# Patient Record
Sex: Female | Born: 1945
Health system: Southern US, Community
[De-identification: ages and names within clinical notes are randomized; demographics above are authoritative.]

## PROBLEM LIST (undated history)

## (undated) DIAGNOSIS — D0359 Melanoma in situ of other part of trunk: Secondary | ICD-10-CM

## (undated) DIAGNOSIS — Z9221 Personal history of antineoplastic chemotherapy: Secondary | ICD-10-CM

## (undated) DIAGNOSIS — Z923 Personal history of irradiation: Secondary | ICD-10-CM

## (undated) DIAGNOSIS — C50919 Malignant neoplasm of unspecified site of unspecified female breast: Secondary | ICD-10-CM

## (undated) DIAGNOSIS — E785 Hyperlipidemia, unspecified: Secondary | ICD-10-CM

## (undated) DIAGNOSIS — C4490 Unspecified malignant neoplasm of skin, unspecified: Secondary | ICD-10-CM

## (undated) DIAGNOSIS — T451X5A Adverse effect of antineoplastic and immunosuppressive drugs, initial encounter: Secondary | ICD-10-CM

## (undated) DIAGNOSIS — I10 Essential (primary) hypertension: Secondary | ICD-10-CM

## (undated) DIAGNOSIS — I427 Cardiomyopathy due to drug and external agent: Secondary | ICD-10-CM

## (undated) HISTORY — DX: Unspecified malignant neoplasm of skin, unspecified: C44.90

## (undated) HISTORY — DX: Essential (primary) hypertension: I10

## (undated) HISTORY — DX: Hyperlipidemia, unspecified: E78.5

## (undated) HISTORY — DX: Adverse effect of antineoplastic and immunosuppressive drugs, initial encounter: T45.1X5A

## (undated) HISTORY — DX: Malignant neoplasm of unspecified site of unspecified female breast: C50.919

## (undated) HISTORY — DX: Adverse effect of antineoplastic and immunosuppressive drugs, initial encounter: I42.7

## (undated) MED FILL — Sodium Chloride IV Soln 0.9%: INTRAVENOUS | Qty: 250 | Status: AC

---

## 1993-02-13 HISTORY — PX: MASTECTOMY: SHX3

## 1999-09-01 ENCOUNTER — Encounter (HOSPITAL_COMMUNITY): Payer: Self-pay | Admitting: Oncology

## 1999-09-01 ENCOUNTER — Encounter: Admission: RE | Admit: 1999-09-01 | Discharge: 1999-09-01 | Payer: Self-pay | Admitting: Oncology

## 1999-09-02 ENCOUNTER — Other Ambulatory Visit: Admission: RE | Admit: 1999-09-02 | Discharge: 1999-09-02 | Payer: Self-pay | Admitting: Obstetrics and Gynecology

## 2000-09-13 ENCOUNTER — Encounter (HOSPITAL_COMMUNITY): Payer: Self-pay | Admitting: Oncology

## 2000-09-13 ENCOUNTER — Encounter: Admission: RE | Admit: 2000-09-13 | Discharge: 2000-09-13 | Payer: Self-pay | Admitting: Oncology

## 2001-03-04 ENCOUNTER — Encounter: Admission: RE | Admit: 2001-03-04 | Discharge: 2001-03-04 | Payer: Self-pay | Admitting: Oncology

## 2001-03-04 ENCOUNTER — Encounter (HOSPITAL_COMMUNITY): Admission: RE | Admit: 2001-03-04 | Discharge: 2001-04-03 | Payer: Self-pay | Admitting: Oncology

## 2001-09-16 ENCOUNTER — Encounter: Admission: RE | Admit: 2001-09-16 | Discharge: 2001-09-16 | Payer: Self-pay | Admitting: *Deleted

## 2001-09-16 ENCOUNTER — Encounter: Payer: Self-pay | Admitting: *Deleted

## 2002-03-04 ENCOUNTER — Encounter: Admission: RE | Admit: 2002-03-04 | Discharge: 2002-03-04 | Payer: Self-pay | Admitting: Oncology

## 2002-03-04 ENCOUNTER — Encounter (HOSPITAL_COMMUNITY): Admission: RE | Admit: 2002-03-04 | Discharge: 2002-04-03 | Payer: Self-pay | Admitting: Oncology

## 2002-09-16 ENCOUNTER — Encounter: Admission: RE | Admit: 2002-09-16 | Discharge: 2002-09-16 | Payer: Self-pay | Admitting: Oncology

## 2002-09-16 ENCOUNTER — Encounter (HOSPITAL_COMMUNITY): Admission: RE | Admit: 2002-09-16 | Discharge: 2002-10-16 | Payer: Self-pay | Admitting: Oncology

## 2002-09-25 ENCOUNTER — Encounter: Admission: RE | Admit: 2002-09-25 | Discharge: 2002-09-25 | Payer: Self-pay | Admitting: Oncology

## 2002-09-25 ENCOUNTER — Encounter (HOSPITAL_COMMUNITY): Payer: Self-pay | Admitting: Oncology

## 2003-03-06 ENCOUNTER — Encounter (HOSPITAL_COMMUNITY): Admission: RE | Admit: 2003-03-06 | Discharge: 2003-04-05 | Payer: Self-pay | Admitting: Oncology

## 2003-03-06 ENCOUNTER — Encounter: Admission: RE | Admit: 2003-03-06 | Discharge: 2003-03-06 | Payer: Self-pay | Admitting: Oncology

## 2003-10-27 ENCOUNTER — Encounter: Admission: RE | Admit: 2003-10-27 | Discharge: 2003-10-27 | Payer: Self-pay | Admitting: *Deleted

## 2004-03-04 ENCOUNTER — Ambulatory Visit (HOSPITAL_COMMUNITY): Payer: Self-pay | Admitting: Oncology

## 2004-03-04 ENCOUNTER — Encounter: Admission: RE | Admit: 2004-03-04 | Discharge: 2004-03-04 | Payer: Self-pay | Admitting: Oncology

## 2004-03-11 ENCOUNTER — Ambulatory Visit (HOSPITAL_COMMUNITY): Admission: RE | Admit: 2004-03-11 | Discharge: 2004-03-11 | Payer: Self-pay | Admitting: Gastroenterology

## 2004-11-18 ENCOUNTER — Ambulatory Visit (HOSPITAL_COMMUNITY): Admission: RE | Admit: 2004-11-18 | Discharge: 2004-11-18 | Payer: Self-pay | Admitting: Oncology

## 2005-03-06 ENCOUNTER — Encounter (HOSPITAL_COMMUNITY): Admission: RE | Admit: 2005-03-06 | Discharge: 2005-04-05 | Payer: Self-pay | Admitting: Oncology

## 2005-03-06 ENCOUNTER — Ambulatory Visit (HOSPITAL_COMMUNITY): Payer: Self-pay | Admitting: Oncology

## 2005-03-06 ENCOUNTER — Encounter: Admission: RE | Admit: 2005-03-06 | Discharge: 2005-03-06 | Payer: Self-pay | Admitting: Oncology

## 2005-04-10 ENCOUNTER — Encounter: Admission: RE | Admit: 2005-04-10 | Discharge: 2005-04-10 | Payer: Self-pay | Admitting: Oncology

## 2005-11-20 ENCOUNTER — Encounter: Admission: RE | Admit: 2005-11-20 | Discharge: 2005-11-20 | Payer: Self-pay | Admitting: Oncology

## 2005-11-28 ENCOUNTER — Encounter (INDEPENDENT_AMBULATORY_CARE_PROVIDER_SITE_OTHER): Payer: Self-pay | Admitting: Specialist

## 2005-11-28 ENCOUNTER — Encounter (INDEPENDENT_AMBULATORY_CARE_PROVIDER_SITE_OTHER): Payer: Self-pay | Admitting: Diagnostic Radiology

## 2005-11-28 ENCOUNTER — Encounter: Admission: RE | Admit: 2005-11-28 | Discharge: 2005-11-28 | Payer: Self-pay | Admitting: Oncology

## 2005-12-05 ENCOUNTER — Ambulatory Visit (HOSPITAL_COMMUNITY): Admission: RE | Admit: 2005-12-05 | Discharge: 2005-12-05 | Payer: Self-pay | Admitting: Oncology

## 2005-12-21 ENCOUNTER — Encounter (INDEPENDENT_AMBULATORY_CARE_PROVIDER_SITE_OTHER): Payer: Self-pay | Admitting: Specialist

## 2005-12-21 ENCOUNTER — Encounter: Admission: RE | Admit: 2005-12-21 | Discharge: 2005-12-21 | Payer: Self-pay | Admitting: General Surgery

## 2005-12-21 ENCOUNTER — Ambulatory Visit (HOSPITAL_BASED_OUTPATIENT_CLINIC_OR_DEPARTMENT_OTHER): Admission: RE | Admit: 2005-12-21 | Discharge: 2005-12-21 | Payer: Self-pay | Admitting: General Surgery

## 2005-12-21 HISTORY — PX: BREAST LUMPECTOMY: SHX2

## 2006-01-12 ENCOUNTER — Ambulatory Visit (HOSPITAL_COMMUNITY): Payer: Self-pay | Admitting: Oncology

## 2006-01-16 ENCOUNTER — Ambulatory Visit: Admission: RE | Admit: 2006-01-16 | Discharge: 2006-04-13 | Payer: Self-pay | Admitting: *Deleted

## 2006-04-13 ENCOUNTER — Ambulatory Visit: Admission: RE | Admit: 2006-04-13 | Discharge: 2006-05-09 | Payer: Self-pay | Admitting: *Deleted

## 2006-05-21 ENCOUNTER — Ambulatory Visit (HOSPITAL_COMMUNITY): Payer: Self-pay | Admitting: Oncology

## 2006-05-23 ENCOUNTER — Ambulatory Visit: Payer: Self-pay | Admitting: Oncology

## 2006-07-25 ENCOUNTER — Ambulatory Visit: Payer: Self-pay | Admitting: Oncology

## 2006-12-05 ENCOUNTER — Encounter (HOSPITAL_COMMUNITY): Admission: RE | Admit: 2006-12-05 | Discharge: 2007-01-04 | Payer: Self-pay | Admitting: Oncology

## 2006-12-05 ENCOUNTER — Encounter (HOSPITAL_COMMUNITY): Payer: Self-pay | Admitting: Oncology

## 2006-12-05 ENCOUNTER — Ambulatory Visit (HOSPITAL_COMMUNITY): Payer: Self-pay | Admitting: Oncology

## 2007-07-15 ENCOUNTER — Encounter: Admission: RE | Admit: 2007-07-15 | Discharge: 2007-07-15 | Payer: Self-pay | Admitting: Oncology

## 2007-12-04 ENCOUNTER — Ambulatory Visit (HOSPITAL_COMMUNITY): Payer: Self-pay | Admitting: Oncology

## 2008-07-20 ENCOUNTER — Encounter: Admission: RE | Admit: 2008-07-20 | Discharge: 2008-07-20 | Payer: Self-pay | Admitting: Oncology

## 2008-12-22 ENCOUNTER — Ambulatory Visit (HOSPITAL_COMMUNITY): Payer: Self-pay | Admitting: Oncology

## 2009-08-10 ENCOUNTER — Encounter: Admission: RE | Admit: 2009-08-10 | Discharge: 2009-08-10 | Payer: Self-pay | Admitting: Oncology

## 2010-01-28 ENCOUNTER — Ambulatory Visit (HOSPITAL_COMMUNITY): Payer: Self-pay | Admitting: Oncology

## 2010-01-28 ENCOUNTER — Encounter (HOSPITAL_COMMUNITY)
Admission: RE | Admit: 2010-01-28 | Discharge: 2010-02-27 | Payer: Self-pay | Source: Home / Self Care | Attending: Oncology | Admitting: Oncology

## 2010-03-05 ENCOUNTER — Encounter (HOSPITAL_COMMUNITY): Payer: Self-pay | Admitting: Oncology

## 2010-07-01 NOTE — Op Note (Signed)
NAMETEJA, Tanya Mitchell NO.:  [id number]   MEDICAL RECORD NO.:  [id number]          PATIENT TYPE:  AMB   LOCATION:  DSC                          FACILITY:  [facility]   PHYSICIAN:  Gita Kudo, M.D. DATE OF BIRTH:  November 08, 1945   DATE OF PROCEDURE:  12/21/2005  DATE OF DISCHARGE:                                 OPERATIVE REPORT   OPERATIVE PROCEDURE:  Right partial mastectomy with needle localization and  specimen mammography.   SURGEON:  Gita Kudo, M.D.   ANESTHESIA:  General.   PREOPERATIVE DIAGNOSIS:  Ductal carcinoma in situ, right breast.   CLINICAL SUMMARY:  [name] is now 65 years old.  Approximately 12 years  ago I did a left mastectomy for a 4.5 cm cancer with one positive node.  She  had a chemotherapy protocol by Dr. Mariel Sleet and then reconstruction by  TRAM.  Now she came in with an abnormality in her right breast,  calcifications on mammogram.  Core biopsy showed DCIS.  Presented at Tumor  Conference, had MRI showing no other pathology, and comes in for a wide  excision.   OPERATIVE FINDINGS:  The breast tissue felt and looked normal.  I went  widely around the wire and did not encounter it.  Mammogram of the specimen  showed good margins.  Pathology is pending.   OPERATIVE PROCEDURE:  Under satisfactory general anesthesia, the patient was  positioned, prepped and draped in a standard fashion.  During the procedure  a total of 30 mL of 0.5% Marcaine was infiltrated for postop analgesia.  A  curved incision was made over the abnormality as calculated from mammogram.  Then this was carried down with flaps and the wire, which was placed  laterally, was brought into the wound through the skin and subcu.  Using the  wire as a guide I went widely around it down to the chest wall and, using  cautery for hemostasis and dissection, removed that entire area.  I did not  encounter the wire again after first finding it and therefore felt I was  widely around the lesion.  This was then marked with suture and sent for  mammography.  Meanwhile, the wound was made hemostatic by cautery and closed  with deep 3-0 Vicryl and interrupted and running 4-0 nylon.  A sterile  pressure  dressing was applied when the report came back from radiology that the wire  clip marking the biopsy site was well in the specimen with good margins  around.  The the patient will be followed as an outpatient.  There were no  complications, and the sponge and needle counts were correct.           ______________________________  Gita Kudo, M.D.     MRL/MEDQ  D:  12/21/2005  T:  12/22/2005  Job:  [id number]   cc:   Ladona Horns. Mariel Sleet, MD  Colleen Can. Deborah Chalk, M.D.  Al Decant. Janey Greaser, MD  [facility] New Patient Coordinator

## 2010-07-12 ENCOUNTER — Other Ambulatory Visit (HOSPITAL_COMMUNITY): Payer: Self-pay | Admitting: Oncology

## 2010-07-12 DIAGNOSIS — Z9012 Acquired absence of left breast and nipple: Secondary | ICD-10-CM

## 2010-07-12 DIAGNOSIS — Z9889 Other specified postprocedural states: Secondary | ICD-10-CM

## 2010-08-12 ENCOUNTER — Ambulatory Visit
Admission: RE | Admit: 2010-08-12 | Discharge: 2010-08-12 | Disposition: A | Discharge status: Home / Self Care | Payer: BC Managed Care – PPO | Source: Ambulatory Visit | Attending: Oncology | Admitting: Oncology

## 2010-08-12 DIAGNOSIS — Z9889 Other specified postprocedural states: Secondary | ICD-10-CM

## 2010-08-12 DIAGNOSIS — Z9012 Acquired absence of left breast and nipple: Secondary | ICD-10-CM

## 2010-08-15 ENCOUNTER — Encounter: Payer: Self-pay | Admitting: Nurse Practitioner

## 2010-08-24 ENCOUNTER — Encounter: Payer: Self-pay | Admitting: Nurse Practitioner

## 2010-08-26 ENCOUNTER — Encounter: Payer: Self-pay | Admitting: Nurse Practitioner

## 2010-08-26 ENCOUNTER — Ambulatory Visit (INDEPENDENT_AMBULATORY_CARE_PROVIDER_SITE_OTHER): Payer: Medicare Other | Admitting: Nurse Practitioner

## 2010-08-26 ENCOUNTER — Other Ambulatory Visit (HOSPITAL_COMMUNITY): Payer: Self-pay | Admitting: Oncology

## 2010-08-26 ENCOUNTER — Telehealth (HOSPITAL_COMMUNITY): Payer: Self-pay | Admitting: *Deleted

## 2010-08-26 VITALS — BP 118/72 | HR 60 | Ht 62.0 in | Wt 137.0 lb

## 2010-08-26 DIAGNOSIS — E785 Hyperlipidemia, unspecified: Secondary | ICD-10-CM

## 2010-08-26 DIAGNOSIS — I428 Other cardiomyopathies: Secondary | ICD-10-CM | POA: Insufficient documentation

## 2010-08-26 MED ORDER — CARVEDILOL PHOSPHATE ER 40 MG PO CP24: 40.0000 mg | ORAL_CAPSULE | Freq: Every day | ORAL | Status: DC

## 2010-08-26 MED ORDER — ROSUVASTATIN CALCIUM 20 MG PO TABS: 20.0000 mg | ORAL_TABLET | Freq: Every day | ORAL | Status: AC

## 2010-08-26 MED ORDER — HYDROCHLOROTHIAZIDE 25 MG PO TABS: 25.0000 mg | ORAL_TABLET | Freq: Every day | ORAL | Status: DC

## 2010-08-26 MED ORDER — RAMIPRIL 10 MG PO TABS: 10.0000 mg | ORAL_TABLET | Freq: Every day | ORAL | Status: DC

## 2010-08-26 NOTE — Patient Instructions (Signed)
Stay on your current medicines We will see you back in one year.  I will have you see Dr. Marca Ancona at that time.

## 2010-08-26 NOTE — Progress Notes (Signed)
    Tanya Mitchell Runner Date of Birth: [birth year]   History of Present Illness: Tanya Mitchell is seen back today for her one year check. She is seen for Dr. Shirlee Latch. She is a former patient of Dr. Ronnald Nian. She is doing great. She retired from [employer] two weeks ago. She is excited. No cardiac complaints. She is tolerating her medicines. Last echo was in July of 2011 and showed her EF to be normal at 55 to 60%. She is not having chest pain or shortness of breath.   Current Outpatient Prescriptions on File Prior to Visit  Medication Sig Dispense Refill  . aspirin 325 MG tablet Take 325 mg by mouth daily.        . carvedilol (COREG CR) 40 MG 24 hr capsule Take 40 mg by mouth daily.        . hydrochlorothiazide 25 MG tablet Take 25 mg by mouth daily.        . ramipril (ALTACE) 10 MG tablet Take 10 mg by mouth daily.        . rosuvastatin (CRESTOR) 20 MG tablet Take 20 mg by mouth daily.        . Multiple Vitamin (MULTIVITAMIN) tablet Take 1 tablet by mouth daily.          No Known Allergies  Past Medical History  Diagnosis Date  . Cardiomyopathy secondary to chemotherapy     FOR BREAST CANCER  . Hypertension   . Hyperlipidemia   . Breast cancer     Past Surgical History  Procedure Date  . Mastectomy     RECONSTRUCTION SURGERY  . Breast lumpectomy   . US echocardiography 09/10/2009    EF 55-60%    History  Smoking status  . Former Smoker  . Quit date: 02/14/1964  Smokeless tobacco  . Not on file    History  Alcohol Use No    Family History  Problem Relation Age of Onset  . Lung cancer Father   . Autism Son     Review of Systems: The review of systems is as above.  All other systems were reviewed and are negative.  Physical Exam: BP 118/72  Pulse 60  Ht 5\' 2"  (1.575 m)  Wt 137 lb (62.143 kg)  BMI 25.06 kg/m2 Patient is very pleasant and in no acute distress. Skin is warm and dry. Color is normal.  HEENT is unremarkable. Normocephalic/atraumatic. PERRL. Sclera are  nonicteric. Neck is supple. No masses. No JVD. Lungs are clear. Cardiac exam shows a regular rate and rhythm. No S3. Abdomen is soft. Extremities are without edema. Gait and ROM are intact. No gross neurologic deficits noted.  LABORATORY DATA:   Assessment / Plan:

## 2010-08-26 NOTE — Assessment & Plan Note (Addendum)
Her cardiomyopathy was secondary to chemotherapy in the remote past. She is on a good CHF regimen. EF is now normal. Last echo was last year. I will have her see Dr. Shirlee Latch for follow up in one year. Patient is agreeable to this plan and will call if any problems develop in the interim.   Dalton [employer]

## 2010-08-26 NOTE — Telephone Encounter (Signed)
Did we originally prescribe this medication?

## 2010-09-05 ENCOUNTER — Telehealth: Payer: Self-pay | Admitting: Nurse Practitioner

## 2010-09-05 NOTE — Telephone Encounter (Signed)
Called stating her insurance says Coreg CR is not on her formulary. Per Lawson Fiscal can give her Carvedilol 12.5 mg BID. States she has 3 months supply now of Coreg. Advised her to call us back in November and we can reorder supply until she sees Dr. Shirlee Latch 7/13. Advised her [facility] would not accept now if she just received her supply. She understands and will call back in November.,

## 2010-09-05 NOTE — Telephone Encounter (Signed)
Patient received letter from insurance [facility] HMO stating that COREG is not in the formulary.  Will need physician approval for the medication or will need to change to a different medication.  Please call patient to discuss.

## 2010-09-09 ENCOUNTER — Encounter: Payer: Self-pay | Admitting: Cardiology

## 2010-11-07 ENCOUNTER — Telehealth: Payer: Self-pay | Admitting: Nurse Practitioner

## 2010-11-07 MED ORDER — CARVEDILOL 12.5 MG PO TABS: 12.5000 mg | ORAL_TABLET | Freq: Two times a day (BID) | ORAL | Status: DC

## 2010-11-07 NOTE — Telephone Encounter (Signed)
Called stating her insurance has changed and they won't approve Coreg CR; per Lawson Fiscal will change her to Carvedilol 12.5 mg BID. Will send to [facility]

## 2010-11-07 NOTE — Telephone Encounter (Signed)
Returning your call. °

## 2010-11-07 NOTE — Telephone Encounter (Signed)
Pt just changed insurance and they are questioning the fact she is on coreg and she needs to talk to someone about this

## 2011-01-27 ENCOUNTER — Encounter (HOSPITAL_COMMUNITY): Payer: Medicare Other | Attending: Oncology | Admitting: Oncology

## 2011-01-27 ENCOUNTER — Encounter (HOSPITAL_COMMUNITY): Payer: Self-pay | Admitting: Oncology

## 2011-01-27 VITALS — BP 131/79 | HR 72 | Temp 98.3°F | Wt 137.4 lb

## 2011-01-27 DIAGNOSIS — Z7982 Long term (current) use of aspirin: Secondary | ICD-10-CM

## 2011-01-27 DIAGNOSIS — C50919 Malignant neoplasm of unspecified site of unspecified female breast: Secondary | ICD-10-CM

## 2011-01-27 DIAGNOSIS — M8580 Other specified disorders of bone density and structure, unspecified site: Secondary | ICD-10-CM

## 2011-01-27 DIAGNOSIS — Z7901 Long term (current) use of anticoagulants: Secondary | ICD-10-CM

## 2011-01-27 DIAGNOSIS — Z853 Personal history of malignant neoplasm of breast: Secondary | ICD-10-CM

## 2011-01-27 NOTE — Progress Notes (Signed)
CC:   Tanya Mantle, RN Tanya Mitchell, M.D. Tanya Mitchell. Tanya Mitchell, M.D. Tanya Mitchell, M.D.  DIAGNOSES: 1. Ductal carcinoma in situ of the right breast, status post biopsy     followed by wide local excision with the first biopsy on 11/28/2005     for a high-grade lesion, estrogen receptor/progesterone receptor-     negative, with several areas within the biopsy that were non-     contiguous.  Dr. Dorna Bloom treated her with radiation therapy     postoperatively.  She has had no evidence of recurrent disease. 2. Left-sided invasive ductal carcinoma of the breast, status post     mastectomy with reconstruction in June 1995, for an estrogen     receptor-positive cancer at 97%, progesterone receptor-positive at     45%, intermediate grade, 1.5 cm in size (T2 N1) with 1 of 10     positive nodes.  She participated in the CALGB protocol 9394 with a     Adriamycin x3 cycles followed by sequential Cytoxan for 3 cycles     and then tamoxifen for 5 years, finishing as of November 2000, once     again without recurrent disease next . 3. BRCA1 and BRCA2 negativity. 4. Hypercholesterolemia, on Crestor in the past, presently not on that     drug. 5. History of hypertension on 10 mg of Altace a day,     hydrochlorothiazide 25 mg a day and Coreg 12.5 mg a day.  She has     seen Dr. Delfin Edis She also takes an aspirin a day,  I should     add. Tanya Mitchell is doing great, asymptomatic.  Just had a grandbaby born to her daughter, who is 35, who moved from [place] to the [place].  She is very happy.  She is getting to see the grandbaby every day basically. Tanya Mitchell is back at work temporarily during the busy season at [employer].  May be able to get blood work there, so we will do that there instead of here today.  Dr. Deborah Mitchell she is aware of has probably retired, so I do not think she has seen anybody else yet at that office, but we will send that office a note.  She is doing well.  She is  up-to-date on colonoscopies, etc.  She is up- to-date on mammography and she has not had a bone density since 2007 so we need to do that.  PHYSICAL EXAMINATION:  Today actually looks great.  She is asymptomatic, not in any pain.  Her weight is 137 pounds.  That is stable compared to last year.  Blood pressure 131/79 in the right arm sitting position, pulse 72 and regular, respirations 16 and unlabored.  She is afebrile. Lymph nodes:  Negative throughout.  The right breast is negative for any masses, just some distortion from the surgery and radiation of the nipple area.  The left chest wall has been reconstructed but is negative as well.  Lungs:  Clear.  She has a skin lesion, left upper back, but she saw the dermatologist, who looked at this just within the last 2 weeks and told her again it is benign, not to worry about, but it is a slightly pigmented lesion that looks slightly irregular to me, so I am glad that the dermatologist saw her.  She has a blue nevus in the right upper arm and a benign skin lesion in the left mid abdomen.  She has no hepatosplenomegaly.  Bowel sounds  are normal.  No distention of her abdomen.  Heart:  Shows a regular rhythm and rate today without murmur, rub or gallop.  She has no peripheral edema and, again, her lungs are clear to auscultation and percussion.  So we will see her back in a year.  We will get her lab work at her place of employment and then get the bone density.  I have instructed her for right now to take 1000 to 2000 units of vitamin D a day and 600 mg of calcium a day, and we will see what the bone density shows before just jumping on the bandwagon of bisphosphonates.    ______________________________ Ladona Horns. Mariel Sleet, MD ESN/MEDQ  D:  01/27/2011  T:  01/27/2011  Job:  [id number]

## 2011-01-27 NOTE — Progress Notes (Signed)
This office note has been dictated.

## 2011-01-27 NOTE — Patient Instructions (Signed)
[facility]  Discharge Instructions STARLINA LAPRE  [id number] 1945/10/21   RECOMMENDATIONS MADE BY THE CONSULTANT AND ANY TEST RESULTS WILL BE SENT TO YOUR REFERRING DOCTOR.   EXAM FINDINGS BY MD TODAY AND SIGNS AND SYMPTOMS TO REPORT TO CLINIC OR PRIMARY MD: Exam findings are good today.  Follow-up with Dr. Mariel Sleet in 1 year.  Dr. Mariel Sleet has written for you to have your employer draw:  CBC, CMET, fasting lipid panel, Vit D level - please be sure that you are fasting prior to having these labs drawn.    MEDICATIONS PRESCRIBED: no prescriptions.   Start taking: *Calcium 600mg  twice daily *Vitamin D 1000-2000mg  daily  I acknowledge that I have been informed and understand all the instructions given to me and received a copy. I do not have any more questions at this time, but understand that I may call the Specialty Clinic at [facility] at [phone] during business hours should I have any further questions or need assistance in obtaining follow-up care.    __________________________________________  _____________  __________ Signature of Patient or Authorized Representative            Date                   Time    __________________________________________ Nurse's Signature

## 2011-02-01 ENCOUNTER — Other Ambulatory Visit: Payer: Medicare Other

## 2011-02-14 DIAGNOSIS — C4490 Unspecified malignant neoplasm of skin, unspecified: Secondary | ICD-10-CM

## 2011-02-14 HISTORY — DX: Unspecified malignant neoplasm of skin, unspecified: C44.90

## 2011-02-16 ENCOUNTER — Ambulatory Visit
Admission: RE | Admit: 2011-02-16 | Discharge: 2011-02-16 | Disposition: A | Discharge status: Home / Self Care | Payer: Medicare Other | Source: Ambulatory Visit | Attending: Oncology | Admitting: Oncology

## 2011-02-16 DIAGNOSIS — M8580 Other specified disorders of bone density and structure, unspecified site: Secondary | ICD-10-CM

## 2011-03-14 ENCOUNTER — Encounter: Payer: Self-pay | Admitting: Oncology

## 2011-03-23 ENCOUNTER — Telehealth (HOSPITAL_COMMUNITY): Payer: Self-pay | Admitting: *Deleted

## 2011-03-23 ENCOUNTER — Other Ambulatory Visit (HOSPITAL_COMMUNITY): Payer: Self-pay | Admitting: Oncology

## 2011-03-23 NOTE — Telephone Encounter (Signed)
What do you think about Lipitor?  The conversion from Crestor 20 mg to Lipitor is 40 mg of Lipitor.

## 2011-03-27 ENCOUNTER — Other Ambulatory Visit (HOSPITAL_COMMUNITY): Payer: Self-pay | Admitting: Oncology

## 2011-03-27 DIAGNOSIS — E7800 Pure hypercholesterolemia, unspecified: Secondary | ICD-10-CM

## 2011-03-27 MED ORDER — ATORVASTATIN CALCIUM 40 MG PO TABS: 40.0000 mg | ORAL_TABLET | Freq: Every day | ORAL | Status: DC

## 2011-03-28 ENCOUNTER — Telehealth (HOSPITAL_COMMUNITY): Payer: Self-pay | Admitting: *Deleted

## 2011-03-28 NOTE — Telephone Encounter (Signed)
Spoke with pt. Took several attempt's for her to understand that the Lipitor was being prescribed for her instead of the Crestor at her request because of the cost of the Crestor. Pt verbalized understanding.

## 2011-03-28 NOTE — Telephone Encounter (Signed)
Message copied by Dennie Maizes on Tue Mar 28, 2011  8:28 AM ------      Message from: Ellouise Newer III      Created: Mon Mar 27, 2011  7:22 PM       Let her know that I e-scribe Lipitor to her [facility]

## 2011-03-30 ENCOUNTER — Other Ambulatory Visit (HOSPITAL_COMMUNITY): Payer: Self-pay | Admitting: Oncology

## 2011-03-30 DIAGNOSIS — E7800 Pure hypercholesterolemia, unspecified: Secondary | ICD-10-CM

## 2011-03-30 MED ORDER — ATORVASTATIN CALCIUM 40 MG PO TABS: 40.0000 mg | ORAL_TABLET | Freq: Every day | ORAL | Status: DC

## 2011-03-31 ENCOUNTER — Other Ambulatory Visit (HOSPITAL_COMMUNITY): Payer: Self-pay | Admitting: Oncology

## 2011-04-18 ENCOUNTER — Telehealth (HOSPITAL_COMMUNITY): Payer: Self-pay | Admitting: *Deleted

## 2011-04-18 ENCOUNTER — Other Ambulatory Visit: Payer: Self-pay

## 2011-04-18 ENCOUNTER — Other Ambulatory Visit: Payer: Self-pay | Admitting: Cardiology

## 2011-04-18 MED ORDER — CARVEDILOL 12.5 MG PO TABS: 12.5000 mg | ORAL_TABLET | Freq: Two times a day (BID) | ORAL | Status: DC

## 2011-04-18 MED ORDER — HYDROCHLOROTHIAZIDE 25 MG PO TABS: 25.0000 mg | ORAL_TABLET | Freq: Every day | ORAL | Status: DC

## 2011-04-18 MED ORDER — RAMIPRIL 10 MG PO TABS: 10.0000 mg | ORAL_TABLET | Freq: Every day | ORAL | Status: DC

## 2011-04-18 NOTE — Telephone Encounter (Signed)
Addended by: Erin Hearing on: 04/18/2011 04:35 PM   Modules accepted: Orders

## 2011-07-12 ENCOUNTER — Other Ambulatory Visit (HOSPITAL_COMMUNITY): Payer: Self-pay | Admitting: Oncology

## 2011-07-12 DIAGNOSIS — Z853 Personal history of malignant neoplasm of breast: Secondary | ICD-10-CM

## 2011-08-15 ENCOUNTER — Ambulatory Visit
Admission: RE | Admit: 2011-08-15 | Discharge: 2011-08-15 | Disposition: A | Discharge status: Home / Self Care | Payer: Medicare Other | Source: Ambulatory Visit | Attending: Oncology | Admitting: Oncology

## 2011-08-15 DIAGNOSIS — Z853 Personal history of malignant neoplasm of breast: Secondary | ICD-10-CM

## 2011-09-14 ENCOUNTER — Telehealth (HOSPITAL_COMMUNITY): Payer: Self-pay | Admitting: *Deleted

## 2011-09-14 DIAGNOSIS — D0359 Melanoma in situ of other part of trunk: Secondary | ICD-10-CM

## 2011-09-14 HISTORY — DX: Melanoma in situ of other part of trunk: D03.59

## 2011-09-14 NOTE — Telephone Encounter (Signed)
CBC diff, CMET, Vitamin D, lipid panel are the lab tests done - pt notified.

## 2011-09-15 ENCOUNTER — Other Ambulatory Visit: Payer: Self-pay

## 2011-09-22 ENCOUNTER — Encounter (INDEPENDENT_AMBULATORY_CARE_PROVIDER_SITE_OTHER): Payer: Self-pay | Admitting: General Surgery

## 2011-09-22 ENCOUNTER — Ambulatory Visit (INDEPENDENT_AMBULATORY_CARE_PROVIDER_SITE_OTHER): Payer: Medicare Other | Admitting: General Surgery

## 2011-09-22 VITALS — BP 126/86 | HR 82 | Temp 98.6°F | Ht 62.0 in | Wt 130.4 lb

## 2011-09-22 DIAGNOSIS — D0359 Melanoma in situ of other part of trunk: Secondary | ICD-10-CM

## 2011-09-22 DIAGNOSIS — C4359 Malignant melanoma of other part of trunk: Secondary | ICD-10-CM

## 2011-09-22 NOTE — Patient Instructions (Addendum)
You will need to hold aspirin containing products, ibuprofen, blood thinners, fish oil for 7 days before surgery.  You will get an injection into the skin around the melanoma biopsy site on the day of surgery.  They will need to take a picture to find out where the skin maps.  You will then come up to the OR holding area.  This is outpt surgery.  Risks of surgery include bleeding, infection, wound breakdown, recurrent cancer.  ALL PATIENTS HAVE NUMBNESS AROUND THE SURGICAL SITE THAT IS USUALLY PERMANENT.

## 2011-09-24 NOTE — Progress Notes (Signed)
Chief Complaint  Patient presents with  . Pre-op Exam    eval melanoma on back    HISTORY: Pt is a 66 year old female who presents with a enlarging mole on her back.  Her husband was concerned, and she saw her dermatologist.  She was found to have a 1.45 mm thick melanoma on her left back below the angle of the scapula.  There were 8 mitoses per hpf.  There was regression, no LVI, close to deep margin.  Ulceration was not definitely identified,but appears "imminent."  She has not had melanoma before.  She does not know of any family members with melanoma, but her father had some type of skin cancer.    She was not able to see lesion, as it was on her back.  She did not notice it itching or hurting.  Pt is brit[place].    Past Medical History  Diagnosis Date  . Cardiomyopathy secondary to chemotherapy     FOR BREAST CANCER  . Hypertension   . Hyperlipidemia   . Breast cancer 1995 and 2008    Past Surgical History  Procedure Date  . Mastectomy 1995    RECONSTRUCTION SURGERY  . Breast lumpectomy 2008  . Us echocardiography 09/10/2009    EF 55-60%    Current Outpatient Prescriptions  Medication Sig Dispense Refill  . aspirin 325 MG tablet Take 325 mg by mouth daily.        . atorvastatin (LIPITOR) 40 MG tablet Take 1 tablet (40 mg total) by mouth daily.  30 tablet  3  . carvedilol (COREG) 12.5 MG tablet Take 1 tablet (12.5 mg total) by mouth 2 (two) times daily.  30 tablet  3  . hydrochlorothiazide (HYDRODIURIL) 25 MG tablet Take 1 tablet (25 mg total) by mouth daily.  90 tablet  3  . Multiple Vitamin (MULTIVITAMIN) tablet Take 1 tablet by mouth daily.        . ramipril (ALTACE) 10 MG tablet Take 1 tablet (10 mg total) by mouth daily.  90 tablet  3  . rosuvastatin (CRESTOR) 20 MG tablet Take 1 tablet (20 mg total) by mouth daily.  90 tablet  1     No Known Allergies   Family History  Problem Relation Age of Onset  . Lung cancer Father   . Cancer Father     lung  . Autism Son        History   Social History  . Marital Status: Married    Spouse Name: N/A    Number of Children: N/A  . Years of Education: N/A   Social History Main Topics  . Smoking status: Former Smoker    Quit date: 02/14/1964  . Smokeless tobacco: None  . Alcohol Use: 0.6 oz/week    1 Glasses of wine per week     dailyn  . Drug Use: No  . Sexually Active:     REVIEW OF SYSTEMS - PERTINENT POSITIVES ONLY: 12 point review of systems negative other than HPI and PMH  EXAM: Filed Vitals:   09/22/11 0912  BP: 126/86  Pulse: 82  Temp: 98.6 F (37 C)    Gen:  No acute distress.  Well nourished and well groomed.   Neurological: Alert and oriented to person, place, and time. Coordination normal.  Head: Normocephalic and atraumatic.  Eyes: Conjunctivae are normal. Pupils are equal, round, and reactive to light. No scleral icterus.  Neck: Normal range of motion. Neck supple. No tracheal deviation or   thyromegaly present.  Cardiovascular: Normal rate, regular rhythm, normal heart sounds and intact distal pulses.  Exam reveals no gallop and no friction rub.  No murmur heard. Respiratory: Effort normal.  No respiratory distress. No chest wall tenderness. Breath sounds normal.  No wheezes, rales or rhonchi.  GI: Soft. Bowel sounds are normal. The abdomen is soft and nontender.  There is no rebound and no guarding.  Musculoskeletal: Normal range of motion. Extremities are nontender.  Lymphadenopathy: No cervical, preauricular, postauricular or axillary adenopathy is present Skin: Skin is warm and dry. No rash noted. No diaphoresis. No erythema. No pallor. No clubbing, cyanosis, or edema.  There is around a 1.3 cm defect from biopsy on left mid back.  There are no other concerning pigmented lesions seen.   Psychiatric: Normal mood and affect. Behavior is normal. Judgment and thought content normal.    LABORATORY RESULTS: Available labs are reviewed  See path report above.     RADIOLOGY  RESULTS: See E-Chart or I-Site for most recent results.  Images and reports are reviewed. None are available.     ASSESSMENT AND PLAN: Melanoma of back, pT2NxMx Pt with intermediate thickness superficial spreading left back melanoma.   Will need wide local excision down to fascia with advancement flap closure.  Will need 2 cm margins. Will need sentinel lymph node biopsy as well.   This may be tricky as she has had breast cancer bilaterally.  She has had at least a sentinel node biopsy on left axilla already.  She may have had ALND.   I have discussed this with nuclear medicine.  We are going to get a lymphoscintogram on the day of surgery to see where skin area drains.  She may still map to the left axilla, but could also map to groin or neck.  Would like to know going into the OR what we are dealing with.   I discussed the rationale for wide margins, and I discussed lymph node issue with patient and husband.   I reviewed the risks of surgery including bleeding, infection, cancer recurrence, numbness, and wound dehiscence.  I also reviewed post operative restrictions.   If her sentinel node is positive, she will need oncology referral.        Faera L Byerly MD Surgical Oncology, General and Endocrine Surgery [facility]      Visit Diagnoses: 1. Melanoma in situ of back   2. Melanoma of back, pT2NxMx     Primary Care Physician: BARNES,ELIZABETH STEWART, MD    

## 2011-09-24 NOTE — Assessment & Plan Note (Signed)
Pt with intermediate thickness superficial spreading left back melanoma.   Will need wide local excision down to fascia with advancement flap closure.  Will need 2 cm margins. Will need sentinel lymph node biopsy as well.   This may be tricky as she has had breast cancer bilaterally.  She has had at least a sentinel node biopsy on left axilla already.  She may have had ALND.   I have discussed this with nuclear medicine.  We are going to get a lymphoscintogram on the day of surgery to see where skin area drains.  She may still map to the left axilla, but could also map to groin or neck.  Would like to know going into the OR what we are dealing with.   I discussed the rationale for wide margins, and I discussed lymph node issue with patient and husband.   I reviewed the risks of surgery including bleeding, infection, cancer recurrence, numbness, and wound dehiscence.  I also reviewed post operative restrictions.   If her sentinel node is positive, she will need oncology referral.

## 2011-10-03 ENCOUNTER — Encounter (HOSPITAL_BASED_OUTPATIENT_CLINIC_OR_DEPARTMENT_OTHER): Payer: Self-pay | Admitting: *Deleted

## 2011-10-03 NOTE — Pre-Procedure Instructions (Signed)
To come for BMET, EKG, CXR 

## 2011-10-05 ENCOUNTER — Other Ambulatory Visit: Payer: Self-pay

## 2011-10-05 ENCOUNTER — Ambulatory Visit
Admission: RE | Admit: 2011-10-05 | Discharge: 2011-10-05 | Disposition: A | Discharge status: Home / Self Care | Payer: Medicare Other | Source: Ambulatory Visit | Attending: Anesthesiology | Admitting: Anesthesiology

## 2011-10-05 ENCOUNTER — Encounter (HOSPITAL_BASED_OUTPATIENT_CLINIC_OR_DEPARTMENT_OTHER)
Admission: RE | Admit: 2011-10-05 | Discharge: 2011-10-05 | Disposition: A | Discharge status: Home / Self Care | Payer: Medicare Other | Source: Ambulatory Visit | Attending: General Surgery | Admitting: General Surgery

## 2011-10-05 LAB — BASIC METABOLIC PANEL
BUN: 15 mg/dL (ref 6–23)
CO2: 27 mEq/L (ref 19–32)
Calcium: 10.1 mg/dL (ref 8.4–10.5)
Chloride: 104 mEq/L (ref 96–112)
Creatinine, Ser: 0.52 mg/dL (ref 0.50–1.10)
GFR calc Af Amer: >90 mL/min (ref >90)
GFR calc non Af Amer: >90 mL/min (ref >90)
Glucose, Bld: 133 mg/dL — ABNORMAL HIGH (ref 70–99)
Potassium: 4.1 mEq/L (ref 3.5–5.1)
Sodium: 141 mEq/L (ref 135–145)

## 2011-10-05 NOTE — Pre-Procedure Instructions (Signed)
Pt. notified to go to Nuclear Medicine dept. at [facility] at [facility] 10/10/2011 for procedure prior to surgery.  Will come to [facility] as soon as finished at [facility]. Med.

## 2011-10-10 ENCOUNTER — Encounter (HOSPITAL_BASED_OUTPATIENT_CLINIC_OR_DEPARTMENT_OTHER): Payer: Self-pay | Admitting: *Deleted

## 2011-10-10 ENCOUNTER — Encounter (HOSPITAL_BASED_OUTPATIENT_CLINIC_OR_DEPARTMENT_OTHER)
Admission: RE | Disposition: A | Discharge status: Home / Self Care | Payer: Self-pay | Source: Ambulatory Visit | Attending: General Surgery

## 2011-10-10 ENCOUNTER — Ambulatory Visit: Admit: 2011-10-10 | Payer: Self-pay | Admitting: General Surgery

## 2011-10-10 ENCOUNTER — Ambulatory Visit (HOSPITAL_BASED_OUTPATIENT_CLINIC_OR_DEPARTMENT_OTHER): Payer: Medicare Other | Admitting: *Deleted

## 2011-10-10 ENCOUNTER — Encounter (HOSPITAL_BASED_OUTPATIENT_CLINIC_OR_DEPARTMENT_OTHER): Payer: Self-pay

## 2011-10-10 ENCOUNTER — Ambulatory Visit (HOSPITAL_BASED_OUTPATIENT_CLINIC_OR_DEPARTMENT_OTHER)
Admission: RE | Admit: 2011-10-10 | Discharge: 2011-10-10 | Disposition: A | Discharge status: Home / Self Care | Payer: Medicare Other | Source: Ambulatory Visit | Attending: General Surgery | Admitting: General Surgery

## 2011-10-10 ENCOUNTER — Encounter (HOSPITAL_COMMUNITY)
Admission: RE | Admit: 2011-10-10 | Discharge: 2011-10-10 | Disposition: A | Discharge status: Home / Self Care | Payer: Medicare Other | Source: Ambulatory Visit | Attending: General Surgery | Admitting: General Surgery

## 2011-10-10 ENCOUNTER — Encounter (HOSPITAL_BASED_OUTPATIENT_CLINIC_OR_DEPARTMENT_OTHER): Payer: Self-pay | Admitting: General Surgery

## 2011-10-10 DIAGNOSIS — C4359 Malignant melanoma of other part of trunk: Secondary | ICD-10-CM | POA: Insufficient documentation

## 2011-10-10 HISTORY — DX: Melanoma in situ of other part of trunk: D03.59

## 2011-10-10 SURGERY — EXCISION, MELANOMA, WITH SENTINEL LYMPH NODE BIOPSY
Anesthesia: General | Site: Back | Laterality: Left | Wound class: Clean

## 2011-10-10 SURGERY — EXCISION, MELANOMA, WITH SENTINEL LYMPH NODE BIOPSY
Anesthesia: General | Site: Back | Laterality: Left

## 2011-10-10 MED ORDER — HYDROMORPHONE HCL PF 1 MG/ML IJ SOLN: 0.2500 mg | INTRAMUSCULAR | Status: DC | PRN

## 2011-10-10 MED ORDER — SODIUM CHLORIDE 0.9 % IV SOLN: 250.0000 mL | INTRAVENOUS | Status: DC | PRN

## 2011-10-10 MED ORDER — PROMETHAZINE HCL 25 MG/ML IJ SOLN: 6.2500 mg | INTRAMUSCULAR | Status: DC | PRN

## 2011-10-10 MED ORDER — PROPOFOL 10 MG/ML IV BOLUS
INTRAVENOUS | Status: DC | PRN
  Administered 2011-10-10: 200 mg via INTRAVENOUS

## 2011-10-10 MED ORDER — SUCCINYLCHOLINE CHLORIDE 20 MG/ML IJ SOLN
INTRAMUSCULAR | Status: DC | PRN
  Administered 2011-10-10: 100 mg via INTRAVENOUS

## 2011-10-10 MED ORDER — MIDAZOLAM HCL 5 MG/5ML IJ SOLN
INTRAMUSCULAR | Status: DC | PRN
  Administered 2011-10-10: 2 mg via INTRAVENOUS

## 2011-10-10 MED ORDER — LIDOCAINE HCL (CARDIAC) 20 MG/ML IV SOLN
INTRAVENOUS | Status: DC | PRN
  Administered 2011-10-10: 50 mg via INTRAVENOUS

## 2011-10-10 MED ORDER — OXYCODONE-ACETAMINOPHEN 5-325 MG PO TABS: 1.0000 | ORAL_TABLET | ORAL | Status: AC | PRN

## 2011-10-10 MED ORDER — ONDANSETRON HCL 4 MG/2ML IJ SOLN
INTRAMUSCULAR | Status: DC | PRN
  Administered 2011-10-10: 4 mg via INTRAVENOUS

## 2011-10-10 MED ORDER — CEFAZOLIN SODIUM-DEXTROSE 2-3 GM-% IV SOLR
2.0000 g | INTRAVENOUS | Status: AC
  Administered 2011-10-10: 2 g via INTRAVENOUS

## 2011-10-10 MED ORDER — LACTATED RINGERS IV SOLN
INTRAVENOUS | Status: DC
  Administered 2011-10-10 (×2): via INTRAVENOUS

## 2011-10-10 MED ORDER — ACETAMINOPHEN 650 MG RE SUPP: 650.0000 mg | RECTAL | Status: DC | PRN

## 2011-10-10 MED ORDER — MIDAZOLAM HCL 2 MG/2ML IJ SOLN: 1.0000 mg | INTRAMUSCULAR | Status: DC | PRN

## 2011-10-10 MED ORDER — DEXAMETHASONE SODIUM PHOSPHATE 4 MG/ML IJ SOLN
INTRAMUSCULAR | Status: DC | PRN
  Administered 2011-10-10: 10 mg via INTRAVENOUS

## 2011-10-10 MED ORDER — TECHNETIUM TC 99M SULFUR COLLOID FILTERED
0.5000 | Freq: Once | INTRAVENOUS | Status: AC | PRN
  Administered 2011-10-10: 0.5 via INTRADERMAL

## 2011-10-10 MED ORDER — FENTANYL CITRATE 0.05 MG/ML IJ SOLN
INTRAMUSCULAR | Status: DC | PRN
  Administered 2011-10-10 (×2): 50 ug via INTRAVENOUS

## 2011-10-10 MED ORDER — FENTANYL CITRATE 0.05 MG/ML IJ SOLN: 50.0000 ug | INTRAMUSCULAR | Status: DC | PRN

## 2011-10-10 MED ORDER — OXYCODONE HCL 5 MG PO TABS: 5.0000 mg | ORAL_TABLET | ORAL | Status: DC | PRN

## 2011-10-10 MED ORDER — ACETAMINOPHEN 325 MG PO TABS: 650.0000 mg | ORAL_TABLET | ORAL | Status: DC | PRN

## 2011-10-10 MED ORDER — SODIUM CHLORIDE 0.9 % IJ SOLN: 3.0000 mL | Freq: Two times a day (BID) | INTRAMUSCULAR | Status: DC

## 2011-10-10 MED ORDER — METHYLENE BLUE 1 % INJ SOLN
INTRAMUSCULAR | Status: DC | PRN
  Administered 2011-10-10: 1 mL via INTRADERMAL

## 2011-10-10 MED ORDER — ONDANSETRON HCL 4 MG/2ML IJ SOLN: 4.0000 mg | Freq: Four times a day (QID) | INTRAMUSCULAR | Status: DC | PRN

## 2011-10-10 MED ORDER — BUPIVACAINE HCL (PF) 0.25 % IJ SOLN
INTRAMUSCULAR | Status: DC | PRN
  Administered 2011-10-10: 20 mL

## 2011-10-10 MED ORDER — SODIUM CHLORIDE 0.9 % IJ SOLN: 3.0000 mL | INTRAMUSCULAR | Status: DC | PRN

## 2011-10-10 SURGICAL SUPPLY — 58 items
APPLIER CLIP 11 MED OPEN (CLIP)
APPLIER CLIP 9.375 MED OPEN (MISCELLANEOUS)
BLADE HEX COATED 2.75 (ELECTRODE) ×2 IMPLANT
BLADE SURG 10 STRL SS (BLADE) ×2 IMPLANT
BLADE SURG 15 STRL LF DISP TIS (BLADE) ×1 IMPLANT
BLADE SURG 15 STRL SS (BLADE) ×1
BNDG COHESIVE 4X5 TAN STRL (GAUZE/BANDAGES/DRESSINGS) ×2 IMPLANT
CANISTER SUCTION 1200CC (MISCELLANEOUS) ×2 IMPLANT
CHLORAPREP W/TINT 26ML (MISCELLANEOUS) ×6 IMPLANT
CLIP APPLIE 11 MED OPEN (CLIP) IMPLANT
CLIP APPLIE 9.375 MED OPEN (MISCELLANEOUS) IMPLANT
CLIP TI LARGE 6 (CLIP) IMPLANT
CLIP TI MEDIUM 6 (CLIP) ×4 IMPLANT
CLIP TI WIDE RED SMALL 6 (CLIP) IMPLANT
CLOTH BEACON ORANGE TIMEOUT ST (SAFETY) ×2 IMPLANT
COVER MAYO STAND STRL (DRAPES) ×2 IMPLANT
COVER PROBE W GEL 5X96 (DRAPES) ×2 IMPLANT
COVER TABLE BACK 60X90 (DRAPES) IMPLANT
DECANTER SPIKE VIAL GLASS SM (MISCELLANEOUS) IMPLANT
DRAPE LAPAROSCOPIC ABDOMINAL (DRAPES) ×2 IMPLANT
DRAPE UTILITY XL STRL (DRAPES) ×2 IMPLANT
DRSG PAD ABDOMINAL 8X10 ST (GAUZE/BANDAGES/DRESSINGS) IMPLANT
DRSG TEGADERM 4X4.75 (GAUZE/BANDAGES/DRESSINGS) ×2 IMPLANT
ELECT REM PT RETURN 9FT ADLT (ELECTROSURGICAL) ×2
ELECTRODE REM PT RTRN 9FT ADLT (ELECTROSURGICAL) ×1 IMPLANT
GLOVE BIO SURGEON STRL SZ 6 (GLOVE) ×4 IMPLANT
GLOVE BIO SURGEON STRL SZ 6.5 (GLOVE) ×2 IMPLANT
GLOVE BIOGEL PI IND STRL 6.5 (GLOVE) ×2 IMPLANT
GLOVE BIOGEL PI IND STRL 7.0 (GLOVE) ×1 IMPLANT
GLOVE BIOGEL PI INDICATOR 6.5 (GLOVE) ×2
GLOVE BIOGEL PI INDICATOR 7.0 (GLOVE) ×1
GOWN PREVENTION PLUS XLARGE (GOWN DISPOSABLE) ×2 IMPLANT
GOWN PREVENTION PLUS XXLARGE (GOWN DISPOSABLE) ×4 IMPLANT
NDL SAFETY ECLIPSE 18X1.5 (NEEDLE) IMPLANT
NEEDLE HYPO 18GX1.5 SHARP (NEEDLE)
NEEDLE HYPO 25X1 1.5 SAFETY (NEEDLE) ×2 IMPLANT
NS IRRIG 1000ML POUR BTL (IV SOLUTION) ×2 IMPLANT
PACK BASIN DAY SURGERY FS (CUSTOM PROCEDURE TRAY) ×2 IMPLANT
PACK UNIVERSAL I (CUSTOM PROCEDURE TRAY) ×2 IMPLANT
PENCIL BUTTON HOLSTER BLD 10FT (ELECTRODE) ×2 IMPLANT
SLEEVE SCD COMPRESS KNEE MED (MISCELLANEOUS) ×2 IMPLANT
SPONGE GAUZE 4X4 12PLY (GAUZE/BANDAGES/DRESSINGS) IMPLANT
SPONGE LAP 18X18 X RAY DECT (DISPOSABLE) ×2 IMPLANT
SPONGE LAP 4X18 X RAY DECT (DISPOSABLE) ×2 IMPLANT
STOCKINETTE IMPERVIOUS LG (DRAPES) ×2 IMPLANT
STRIP CLOSURE SKIN 1/2X4 (GAUZE/BANDAGES/DRESSINGS) ×2 IMPLANT
SUT MON AB 4-0 PC3 18 (SUTURE) ×2 IMPLANT
SUT SILK 2 0 SH (SUTURE) ×2 IMPLANT
SUT VIC AB 3-0 SH 27 (SUTURE) ×1
SUT VIC AB 3-0 SH 27X BRD (SUTURE) ×1 IMPLANT
SYR BULB 3OZ (MISCELLANEOUS) ×2 IMPLANT
SYR CONTROL 10ML LL (SYRINGE) ×2 IMPLANT
SYR TB 1ML 26GX3/8 SAFETY (SYRINGE) ×2 IMPLANT
TOWEL OR 17X24 6PK STRL BLUE (TOWEL DISPOSABLE) ×2 IMPLANT
TOWEL OR NON WOVEN STRL DISP B (DISPOSABLE) ×2 IMPLANT
TUBE CONNECTING 20X1/4 (TUBING) ×2 IMPLANT
WATER STERILE IRR 1000ML POUR (IV SOLUTION) IMPLANT
YANKAUER SUCT BULB TIP NO VENT (SUCTIONS) ×2 IMPLANT

## 2011-10-10 NOTE — Interval H&P Note (Signed)
History and Physical Interval Note:  10/10/2011 10:43 AM  Tanya Mitchell  has presented today for surgery, with the diagnosis of melanoma left back 1.5cm  The various methods of treatment have been discussed with the patient and family. After consideration of risks, benefits and other options for treatment, the patient has consented to  Procedure(s) (LRB): EXCISION MELANOMA WITH SENTINEL LYMPH NODE BIOPSY (Left) as a surgical intervention .  The patient's history has been reviewed, patient examined, no change in status, stable for surgery.  I have reviewed the patient's chart and labs.  Questions were answered to the patient's satisfaction.     BYERLY,FAERA

## 2011-10-10 NOTE — Anesthesia Preprocedure Evaluation (Addendum)
Anesthesia Evaluation  Patient identified by MRN, date of birth, ID band Patient awake    Reviewed: Allergy & Precautions, H&P , NPO status , Patient's Chart, lab work & pertinent test results  Airway Mallampati: I TM Distance: >3 FB Neck ROM: Full    Dental   Pulmonary former smoker,    Pulmonary exam normal       Cardiovascular hypertension, Pt. on medications  Chemo induced cardiomyopathy   Neuro/Psych    GI/Hepatic   Endo/Other    Renal/GU      Musculoskeletal   Abdominal   Peds  Hematology   Anesthesia Other Findings   Reproductive/Obstetrics H/o breast ca                          Anesthesia Physical Anesthesia Plan  ASA: III  Anesthesia Plan: General   Post-op Pain Management:    Induction: Intravenous  Airway Management Planned: Oral ETT  Additional Equipment:   Intra-op Plan:   Post-operative Plan: Extubation in OR  Informed Consent: I have reviewed the patients History and Physical, chart, labs and discussed the procedure including the risks, benefits and alternatives for the proposed anesthesia with the patient or authorized representative who has indicated his/her understanding and acceptance.     Plan Discussed with: CRNA and Surgeon  Anesthesia Plan Comments:         Anesthesia Quick Evaluation

## 2011-10-10 NOTE — Transfer of Care (Signed)
Immediate Anesthesia Transfer of Care Note  Patient: Tanya Mitchell  Procedure(s) Performed: Procedure(s) (LRB): EXCISION MELANOMA WITH SENTINEL LYMPH NODE BIOPSY (Left)  Patient Location: PACU  Anesthesia Type: General  Level of Consciousness: awake and alert   Airway & Oxygen Therapy: Patient Spontanous Breathing and Patient connected to face mask oxygen  Post-op Assessment: Report given to PACU RN and Post -op Vital signs reviewed and stable  Post vital signs: Reviewed and stable  Complications: No apparent anesthesia complications

## 2011-10-10 NOTE — Anesthesia Postprocedure Evaluation (Signed)
  Anesthesia Post-op Note  Patient: Tanya Mitchell  Procedure(s) Performed: Procedure(s) (LRB): EXCISION MELANOMA WITH SENTINEL LYMPH NODE BIOPSY (Left)  Patient Location: PACU  Anesthesia Type: General  Level of Consciousness: awake and alert   Airway and Oxygen Therapy: Patient Spontanous Breathing  Post-op Pain: mild  Post-op Assessment: Post-op Vital signs reviewed, Patient's Cardiovascular Status Stable, Respiratory Function Stable, Patent Airway, No signs of Nausea or vomiting, Adequate PO intake and Pain level controlled  Post-op Vital Signs: stable  Complications: No apparent anesthesia complications

## 2011-10-10 NOTE — Op Note (Signed)
PRE-OPERATIVE DIAGNOSIS: cT2aN0 left back melanoma  POST-OPERATIVE DIAGNOSIS:  Same  PROCEDURE:  Procedure(s): Wide local excision 2 cm margins, advancement flap closure for defect 7.5 cm x 5.4 cm, left axillary sentinel lymph node mapping and biopsy  SURGEON:  Surgeon(s): Almond Lint, MD  ANESTHESIA:   local and general  DRAINS: none   LOCAL MEDICATIONS USED:  MARCAINE and XYLOCAINE   SPECIMEN:  Source of Specimen:  1 axillary sentinel lymph node, wide local excision back, additional margins   FINDINGS:  SLN #1 cps 524, blue  DISPOSITION OF SPECIMEN:  PATHOLOGY  COUNTS:  YES  PLAN OF CARE: Discharge to home after PACU  PATIENT DISPOSITION:  PACU - hemodynamically stable.    PROCEDURE:   Pt was identified in the holding area, taken to the OR, and placed supine on the OR table.  General anesthesia was induced.  Time out was performed according to the surgical safety checklist.  When all was correct, we continued.  One mL methylene blue was injected intradermally around the melanoma biopsy site.    The patient's left arm and chest were prepped and draped in sterile fashion.  The point of maximum signal intensity was identified with the neoprobe.  A 4 cm incision was made with a #15 blade.  The subcutaneous tissues were divided with the cautery.  A Weitlaner retractor was used to assist with visualization.  The tonsil clamp was used to bluntly dissect the axillary fat pad.  1 sentinel lymph node was identified as described above.  The lymphovascular channels were clipped with hemoclips.  The nodes were passed off as specimens.  Hemostasis was achieved with the cautery.  The axilla was irrigated and closed with 3-0 Vicryl deep dermal interrupted sutures and 4-0 Monocryl running subcuticular suture.  This was cleaned, dried, and dressed with Benzoin, steristrips, gauze, and tegaderm.  Counts were correct.  The patient was placed into the right lateral decubitus position on a beanbag  with appropriate padding.  The upper back was prepped and draped in sterile fashion.  The melanoma was identified and 2 cm margins were marked out.  20 mL local was administered under the melanoma and the adjacent tissue.  A #10 blade was used to incise the skin around the melanoma.  The cautery was used to take the dissection down to the fascia.  The skin was marked in situ with silk suture.  The cautery was used to take the specimen off the fascia, and it was passed off the table.    Penetrating towel clips were used to elevate the edges of the incision and the skin was freed up in all directions.  This was pulled together in an oblique orientation. The redundant tissue was taken off both corners to make an ellipse with sharp tissue scissors.  These were marked as well.  The skin was pulled together and held with penetrating towel clips. Deep interrupted 2-0 vicryl sutures were placed to relieve tension.  The skin was then reapproximated with 3-0 interrupted vicryl deep dermal sutures and 4-0 monocryl running subcuticular sutures.  Four 2-0 nylon horizontal mattress sutures were placed as well.  The wound was dressed with Benzoin, steristrips, gauze, and tegaderm.    Needle, sponge, and instrument counts were correct.  The patient was awakened from anesthesia and taken to the PACU in stable condition.

## 2011-10-10 NOTE — H&P (View-Only) (Signed)
Chief Complaint  Patient presents with  . Pre-op Exam    eval melanoma on back    HISTORY: Pt is a 66 year old female who presents with a enlarging mole on her back.  Her husband was concerned, and she saw her dermatologist.  She was found to have a 1.45 mm thick melanoma on her left back below the angle of the scapula.  There were 8 mitoses per hpf.  There was regression, no LVI, close to deep margin.  Ulceration was not definitely identified,but appears "imminent."  She has not had melanoma before.  She does not know of any family members with melanoma, but her father had some type of skin cancer.    She was not able to see lesion, as it was on her back.  She did not notice it itching or hurting.  Pt is brit[place].    Past Medical History  Diagnosis Date  . Cardiomyopathy secondary to chemotherapy     FOR BREAST CANCER  . Hypertension   . Hyperlipidemia   . Breast cancer 1995 and 2008    Past Surgical History  Procedure Date  . Mastectomy 1995    RECONSTRUCTION SURGERY  . Breast lumpectomy 2008  . US echocardiography 09/10/2009    EF 55-60%    Current Outpatient Prescriptions  Medication Sig Dispense Refill  . aspirin 325 MG tablet Take 325 mg by mouth daily.        [name] atorvastatin (LIPITOR) 40 MG tablet Take 1 tablet (40 mg total) by mouth daily.  30 tablet  3  . carvedilol (COREG) 12.5 MG tablet Take 1 tablet (12.5 mg total) by mouth 2 (two) times daily.  30 tablet  3  . hydrochlorothiazide (HYDRODIURIL) 25 MG tablet Take 1 tablet (25 mg total) by mouth daily.  90 tablet  3  . Multiple Vitamin (MULTIVITAMIN) tablet Take 1 tablet by mouth daily.        . ramipril (ALTACE) 10 MG tablet Take 1 tablet (10 mg total) by mouth daily.  90 tablet  3  . rosuvastatin (CRESTOR) 20 MG tablet Take 1 tablet (20 mg total) by mouth daily.  90 tablet  1     No Known Allergies   Family History  Problem Relation Age of Onset  . Lung cancer Father   . Cancer Father     lung  . Autism Son        History   Social History  . Marital Status: Married    Spouse Name: N/A    Number of Children: N/A  . Years of Education: N/A   Social History Main Topics  . Smoking status: Former Smoker    Quit date: 02/14/1964  . Smokeless tobacco: None  . Alcohol Use: 0.6 oz/week    1 Glasses of wine per week     dailyn  . Drug Use: No  . Sexually Active:     REVIEW OF SYSTEMS - PERTINENT POSITIVES ONLY: 12 point review of systems negative other than HPI and PMH  EXAM: Filed Vitals:   09/22/11 0912  BP: 126/86  Pulse: 82  Temp: 98.6 F (37 C)    Gen:  No acute distress.  Well nourished and well groomed.   Neurological: Alert and oriented to person, place, and time. Coordination normal.  Head: Normocephalic and atraumatic.  Eyes: Conjunctivae are normal. Pupils are equal, round, and reactive to light. No scleral icterus.  Neck: Normal range of motion. Neck supple. No tracheal deviation or  thyromegaly present.  Cardiovascular: Normal rate, regular rhythm, normal heart sounds and intact distal pulses.  Exam reveals no gallop and no friction rub.  No murmur heard. Respiratory: Effort normal.  No respiratory distress. No chest wall tenderness. Breath sounds normal.  No wheezes, rales or rhonchi.  GI: Soft. Bowel sounds are normal. The abdomen is soft and nontender.  There is no rebound and no guarding.  Musculoskeletal: Normal range of motion. Extremities are nontender.  Lymphadenopathy: No cervical, preauricular, postauricular or axillary adenopathy is present Skin: Skin is warm and dry. No rash noted. No diaphoresis. No erythema. No pallor. No clubbing, cyanosis, or edema.  There is around a 1.3 cm defect from biopsy on left mid back.  There are no other concerning pigmented lesions seen.   Psychiatric: Normal mood and affect. Behavior is normal. Judgment and thought content normal.    LABORATORY RESULTS: Available labs are reviewed  See path report above.     RADIOLOGY  RESULTS: See E-Chart or I-Site for most recent results.  Images and reports are reviewed. None are available.     ASSESSMENT AND PLAN: Melanoma of back, pT2NxMx Pt with intermediate thickness superficial spreading left back melanoma.   Will need wide local excision down to fascia with advancement flap closure.  Will need 2 cm margins. Will need sentinel lymph node biopsy as well.   This may be tricky as she has had breast cancer bilaterally.  She has had at least a sentinel node biopsy on left axilla already.  She may have had ALND.   I have discussed this with nuclear medicine.  We are going to get a lymphoscintogram on the day of surgery to see where skin area drains.  She may still map to the left axilla, but could also map to groin or neck.  Would like to know going into the OR what we are dealing with.   I discussed the rationale for wide margins, and I discussed lymph node issue with patient and husband.   I reviewed the risks of surgery including bleeding, infection, cancer recurrence, numbness, and wound dehiscence.  I also reviewed post operative restrictions.   If her sentinel node is positive, she will need oncology referral.        Maudry Diego MD Surgical Oncology, General and Endocrine Surgery [facility]      Visit Diagnoses: 1. Melanoma in situ of back   2. Melanoma of back, pT2NxMx     Primary Care Physician: Gaye Alken, MD

## 2011-10-10 NOTE — Anesthesia Procedure Notes (Signed)
Procedure Name: Intubation Date/Time: 10/10/2011 11:47 AM Performed by: Caren Macadam Pre-anesthesia Checklist: Patient identified, Emergency Drugs available, Suction available and Patient being monitored Patient Re-evaluated:Patient Re-evaluated prior to inductionOxygen Delivery Method: Circle System Utilized Preoxygenation: Pre-oxygenation with 100% oxygen Intubation Type: IV induction Ventilation: Mask ventilation without difficulty Laryngoscope Size: Miller and 2 Grade View: Grade I Tube type: Oral Number of attempts: 1 Airway Equipment and Method: stylet Placement Confirmation: ETT inserted through vocal cords under direct vision,  positive ETCO2 and breath sounds checked- equal and bilateral Secured at: 22 cm Tube secured with: Tape Dental Injury: Teeth and Oropharynx as per pre-operative assessment

## 2011-10-11 LAB — POCT HEMOGLOBIN-HEMACUE: Hemoglobin: 15.4 g/dL — ABNORMAL HIGH (ref 12.0–15.0)

## 2011-10-17 ENCOUNTER — Encounter (HOSPITAL_COMMUNITY): Payer: Self-pay

## 2011-10-17 ENCOUNTER — Telehealth (INDEPENDENT_AMBULATORY_CARE_PROVIDER_SITE_OTHER): Payer: Self-pay

## 2011-10-17 ENCOUNTER — Telehealth (INDEPENDENT_AMBULATORY_CARE_PROVIDER_SITE_OTHER): Payer: Self-pay | Admitting: General Surgery

## 2011-10-17 NOTE — Telephone Encounter (Signed)
Pt calling inquiring about path results from 10/10/11 procedure.  Advised pt pathology results were not in yet but we would call her when we receive them.  Pt satisfied.

## 2011-10-17 NOTE — Telephone Encounter (Signed)
Discussed positive lymph node biopsy with pt.  She will need to see Dr. Mariel Sleet.

## 2011-10-17 NOTE — Telephone Encounter (Signed)
Pt called back today after speaking with her husband.  Pt request Dr. Donell Beers call the lab for a verbal on the pathology results and then call her (the patient) with results.  Advised pt I would pass along the message but explained Dr. Donell Beers is currently seeing patients and it may be later today or tomorrow before getting a return call.  Pt agreed with plan.

## 2011-10-18 ENCOUNTER — Telehealth (INDEPENDENT_AMBULATORY_CARE_PROVIDER_SITE_OTHER): Payer: Self-pay | Admitting: General Surgery

## 2011-10-18 ENCOUNTER — Other Ambulatory Visit (HOSPITAL_COMMUNITY): Payer: Self-pay | Admitting: Oncology

## 2011-10-18 DIAGNOSIS — C4359 Malignant melanoma of other part of trunk: Secondary | ICD-10-CM

## 2011-10-18 NOTE — Telephone Encounter (Signed)
Advised pt about tumor cell deposits in lymph node.    Have discussed with pathology and Dr. Mariel Sleet.  He will make arrangements to send to Trinda Pascal at [facility]

## 2011-10-20 ENCOUNTER — Telehealth (HOSPITAL_COMMUNITY): Payer: Self-pay | Admitting: *Deleted

## 2011-10-23 ENCOUNTER — Other Ambulatory Visit (HOSPITAL_COMMUNITY): Payer: Medicare Other

## 2011-10-23 ENCOUNTER — Ambulatory Visit (INDEPENDENT_AMBULATORY_CARE_PROVIDER_SITE_OTHER): Payer: Medicare Other | Admitting: General Surgery

## 2011-10-23 VITALS — BP 122/84 | HR 72 | Temp 97.4°F | Resp 18 | Ht 62.0 in | Wt 126.2 lb

## 2011-10-23 DIAGNOSIS — C4359 Malignant melanoma of other part of trunk: Secondary | ICD-10-CM

## 2011-10-23 NOTE — Patient Instructions (Signed)
OK to start liberalizing activities.  Ok to wear bra.  Follow up with me in 3 months unless issues arise.  See Dr. Dan Europe at [facility] next week.

## 2011-10-23 NOTE — Progress Notes (Signed)
HISTORY: Pt doing well from incisional standpoint.  She has no pain.  She has not had swelling, either.  She was fairly devastated by the news of the sentinel lymph node being positive, however.  She does not have her PET scan today.  She inquires about picking up her grandchild.     EXAM: General:  Alert and oriented.   Incision:  No evidence of infection.  Sutures removed.  No swelling in axilla.      PATHOLOGY: 1. Lymph node, sentinel, biopsy, Left axillary - METASTATIC MALIGNANT MELANOMA IN 1 OF 1 LYMPH NODE (1/1). - SEE COMMENT. 2. Skin , Upper left back - RESIDUAL MALIGNANT MELANOMA IN SITU. - THE MARGINS APPEAR NEGATIVE FOR TUMOR. 3. Skin , Lateral margin - BENIGN SKIN. - THERE IS NO EVIDENCE OF MALIGNANCY. 4. Skin , Medial margin - BENIGN SKIN. - THERE IS NO EVIDENCE OF MALIGNANCY. ADDENDUM 1. The lymph node containing metastatic malignant melanoma contains a few (3-4) groups of malignant cells. Each group consists of approximately 10-20 cells based on the immunohistochemical stains. The case was discussed with Dr. Donell Beers on 10/17/2011. (JBK:caf 10/18/11)   ASSESSMENT AND PLAN:   Melanoma of back, pT2NxMx Pt doing well post op.  Sutures removed.  Follow up in 3 months.    Pt has appt with Dr. Trinda Pascal at [facility] next week for discussion of treatment for SLN positive melanoma.   Cleared to liberalize activities.     Tanya Diego, MD Surgical Oncology, General & Endocrine Surgery [facility]  Gaye Alken, MD Marthe Patch*

## 2011-10-23 NOTE — Assessment & Plan Note (Addendum)
Pt doing well post op.  Sutures removed.  Follow up in 3 months.    Pt has appt with Dr. Trinda Pascal at [facility] next week for discussion of treatment for SLN positive melanoma.

## 2011-10-25 ENCOUNTER — Encounter (HOSPITAL_COMMUNITY)
Admission: RE | Admit: 2011-10-25 | Discharge: 2011-10-25 | Disposition: A | Discharge status: Home / Self Care | Payer: Medicare Other | Source: Ambulatory Visit | Attending: Oncology | Admitting: Oncology

## 2011-10-25 ENCOUNTER — Encounter (HOSPITAL_COMMUNITY): Payer: Self-pay

## 2011-10-25 DIAGNOSIS — C4359 Malignant melanoma of other part of trunk: Secondary | ICD-10-CM | POA: Insufficient documentation

## 2011-10-25 DIAGNOSIS — R918 Other nonspecific abnormal finding of lung field: Secondary | ICD-10-CM | POA: Insufficient documentation

## 2011-10-25 LAB — GLUCOSE, CAPILLARY: Glucose-Capillary: 103 mg/dL — ABNORMAL HIGH (ref 70–99)

## 2011-10-25 MED ORDER — FLUDEOXYGLUCOSE F - 18 (FDG) INJECTION
16.9000 | Freq: Once | INTRAVENOUS | Status: AC | PRN
  Administered 2011-10-25: 16.9 via INTRAVENOUS

## 2011-10-27 ENCOUNTER — Encounter (INDEPENDENT_AMBULATORY_CARE_PROVIDER_SITE_OTHER): Payer: Medicare Other | Admitting: General Surgery

## 2011-11-06 ENCOUNTER — Encounter (HOSPITAL_COMMUNITY): Payer: Medicare Other | Attending: Oncology | Admitting: Oncology

## 2011-11-06 ENCOUNTER — Telehealth (INDEPENDENT_AMBULATORY_CARE_PROVIDER_SITE_OTHER): Payer: Self-pay | Admitting: General Surgery

## 2011-11-06 ENCOUNTER — Encounter (HOSPITAL_COMMUNITY): Payer: Self-pay | Admitting: Oncology

## 2011-11-06 VITALS — BP 165/89 | HR 73 | Temp 97.9°F | Resp 16 | Wt 126.7 lb

## 2011-11-06 DIAGNOSIS — C4359 Malignant melanoma of other part of trunk: Secondary | ICD-10-CM

## 2011-11-06 DIAGNOSIS — R911 Solitary pulmonary nodule: Secondary | ICD-10-CM

## 2011-11-06 NOTE — Patient Instructions (Addendum)
[facility]  Discharge Instructions  RECOMMENDATIONS MADE BY THE CONSULTANT AND ANY TEST RESULTS WILL BE SENT TO YOUR REFERRING DOCTOR.   Mazzie will arrange for you to have labs and CT scans done over in [place]. She will call you with those appointments. Keep your appointment with Dr.Neijstrom as already scheduled for 01/24/12.   I acknowledge that I have been informed and understand all the instructions given to me and received a copy. I do not have any more questions at this time, but understand that I may call the Specialty Clinic at [facility] at [phone] during business hours should I have any further questions or need assistance in obtaining follow-up care.    __________________________________________  _____________  __________ Signature of Patient or Authorized Representative            Date                   Time    __________________________________________ Nurse's Signature

## 2011-11-06 NOTE — Telephone Encounter (Signed)
Discussed the role of axillary lymph node dissection with patient.    No survival benefit if lymph nodes negative, but longer disease free survival.  Advised patient that standard of care is to do axillary lymph node dissection.    Pt to discuss with Dr. Mariel Sleet today.  Will make decision and let me know.

## 2011-11-06 NOTE — Progress Notes (Signed)
Problem #1 stage III melanoma of the left upper back with a sentinel node positive for microscopic metastasis. She is here with her husband and daughter to discuss her conversations with Dr. Dan Europe and Dr. Donell Beers. She was informed last week that the melanoma surgeon at [facility] recommended lymph node dissection of the level III lymph nodes on the left axilla because of the positive sentinel node in the hopes of controlling local regional disease. The patient is well aware as is her family that she also small pulmonary nodules seen on the CT images of the PET scan recently. With her lymph node dissection in the past for her breast cancer she is somewhat reluctant to undergo more lymph node surgery since she has escaped lymphedema thus far.  She has decided not to pursue interferon therapy which is fine from my perspective. She is however also where the radiation therapy and chemotherapy going forward are not an issue involved in this immediate decision. She therefore like to think things over but one option that she is considering strongly is repeating diagnostic CT scan of the chest to evaluate the pulmonary nodules seen on the PET scan in 3 months and I will also evaluate the axillary areas. She could still have surgical excision at that time of the lymph nodes since it may be 3 or 4 weeks before she get on someone surgical schedule anyway. She is inclined to do the above before making a final decision about the axilla node dissection since if she has metastatic disease to the lungs the axillary lymph node dissection may be a waste of her time and of no real benefit to her from a cancer standpoint. If she has further questions she knows she can direct them to either myself, Dr. Donell Beers, or Dr.Collichio. I have tentatively scheduled her for lab work and CAT scan on 01/18/2012

## 2011-11-13 ENCOUNTER — Telehealth: Payer: Self-pay | Admitting: Nurse Practitioner

## 2011-11-13 NOTE — Telephone Encounter (Signed)
Just needs an OV with him. If he has no availability, please schedule with me.

## 2011-11-13 NOTE — Telephone Encounter (Signed)
pt was to see dr Shirlee Latch in 1 year per ov note 08-26-10, however a recall was not put in, she wants to know if she needs a stress test at this visit as well? pls advise

## 2011-11-13 NOTE — Telephone Encounter (Signed)
Pt calling

## 2011-11-21 NOTE — Telephone Encounter (Signed)
New problem:  patient is very upset as to why no one has call her since her last telephone message on 9/30.  Would like an explanation as to why  No appt has been made . She stated she is losing confidence with [place] when it come to her  health.  aware that nurse is off today.

## 2011-11-21 NOTE — Telephone Encounter (Signed)
Spoke to patient was told not sure why she did not get a call back.Appointment scheduled with Norma Fredrickson NP 11/30/11.Message will be given to our nurse manger.

## 2011-11-28 ENCOUNTER — Encounter (INDEPENDENT_AMBULATORY_CARE_PROVIDER_SITE_OTHER): Payer: Self-pay

## 2011-11-30 ENCOUNTER — Ambulatory Visit (INDEPENDENT_AMBULATORY_CARE_PROVIDER_SITE_OTHER): Payer: Medicare Other | Admitting: Nurse Practitioner

## 2011-11-30 ENCOUNTER — Encounter: Payer: Self-pay | Admitting: Nurse Practitioner

## 2011-11-30 VITALS — BP 120/84 | HR 72 | Ht 62.0 in | Wt 129.8 lb

## 2011-11-30 DIAGNOSIS — I428 Other cardiomyopathies: Secondary | ICD-10-CM

## 2011-11-30 DIAGNOSIS — I420 Dilated cardiomyopathy: Secondary | ICD-10-CM

## 2011-11-30 NOTE — Progress Notes (Signed)
Tanya Mitchell Date of Birth: Feb 24, 1945 Medical Record #[id number]  History of Present Illness: Tanya Mitchell is seen back today for a follow up visit. She is seen for Dr. Shirlee Latch. She is a former patient of Dr. Ronnald Nian. She has had a prior cardiomyopathy secondary to chemotherapy for breast cancer in the remote past. Her EF has recovered. Last echo was in July of 2011. Dr. Deborah Chalk had suggested repeating in 3 to 5 years.   She comes in today. She is here alone. Her appointment got overlooked and was not scheduled with Dr. Shirlee Latch. She has some concerns about that which we discussed. Clinically she is doing well and has no symptoms. Not short of breath. Not dizzy or lightheaded. Feels good on her medicines. She has lost weight. She is exercising more since she retired from [employer]. She is doing some volunteer work with the [employer] She has recently been diagnosed with a melanoma and her sentinel node biopsy was positive for microscopic cells. Her course of treatment is not clear. She is for repeat CT scans in about 3 months. She continues to see Dr. Mariel Sleet. She has her routine labs with her PCP.   Current Outpatient Prescriptions on File Prior to Visit  Medication Sig Dispense Refill  . aspirin 325 MG tablet Take 325 mg by mouth daily. 1/2 tablet daily      . atorvastatin (LIPITOR) 40 MG tablet Take 1 tablet (40 mg total) by mouth daily.  30 tablet  3  . calcium carbonate (OS-CAL) 600 MG TABS Take 600 mg by mouth 2 (two) times daily with a meal.      . carvedilol (COREG) 12.5 MG tablet Take 1 tablet (12.5 mg total) by mouth 2 (two) times daily.  30 tablet  3  . cholecalciferol (VITAMIN D) 1000 UNITS tablet Take 1,000 Units by mouth daily.      . hydrochlorothiazide (HYDRODIURIL) 25 MG tablet Take 1 tablet (25 mg total) by mouth daily.  90 tablet  3  . ramipril (ALTACE) 10 MG tablet Take 1 tablet (10 mg total) by mouth daily.  90 tablet  3    No Known Allergies  Past Medical History    Diagnosis Date  . Cardiomyopathy secondary to chemotherapy     has improved   . Hyperlipidemia   . Hypertension     under control, has been on med. x 3 yrs.  . Breast cancer 1995 and 2007  . Melanoma in situ of back 09/2011    left    Past Surgical History  Procedure Date  . Breast lumpectomy 12/21/2005    right  . Mastectomy 1995    with breast reconstruction - left    History  Smoking status  . Former Smoker  . Quit date: 02/14/1964  Smokeless tobacco  . Never Used    History  Alcohol Use  . 0.0 oz/week    daily glass of wine    Family History  Problem Relation Age of Onset  . Lung cancer Father   . Cancer Father     lung  . Autism Son     Review of Systems: The review of systems is per the HPI.  All other systems were reviewed and are negative.  Physical Exam: BP 120/84  Pulse 72  Ht 5\' 2"  (1.575 m)  Wt 129 lb 12.8 oz (58.877 kg)  BMI 23.74 kg/m2 Patient is very pleasant and in no acute distress. Skin is warm and dry. Color is normal.  HEENT is unremarkable. Normocephalic/atraumatic. PERRL. Sclera are nonicteric. Neck is supple. No masses. No JVD. Lungs are clear. Cardiac exam shows a regular rate and rhythm. Abdomen is soft. Extremities are without edema. Gait and ROM are intact. No gross neurologic deficits noted.  LABORATORY DATA: N/A   Assessment / Plan: 1. History of CM - last echo was in 2011. May give consideration for repeat in 2014. She is doing great clinically. No change in her current regimen. I will get her in to meet Dr. Shirlee Latch in about 4 months.   2. Melanoma - treatment plan not clear at this time.   3. HLD - now on Lipitor. She has her labs with her PCP  Patient is agreeable to this plan and will call if any problems develop in the interim.

## 2011-11-30 NOTE — Patient Instructions (Addendum)
I think you are doing well  Stay on your current medicines  We will get you a visit with Dr. Shirlee Latch in 4 months  Call the [facility] office at [phone] if you have any questions, problems or concerns.

## 2011-12-14 ENCOUNTER — Encounter: Payer: Self-pay | Admitting: Cardiology

## 2011-12-15 ENCOUNTER — Telehealth (HOSPITAL_COMMUNITY): Payer: Self-pay | Admitting: Oncology

## 2011-12-22 ENCOUNTER — Telehealth: Payer: Self-pay | Admitting: Genetic Counselor

## 2011-12-22 NOTE — Telephone Encounter (Signed)
S/W pt in re Genetic appt 1/09 @ 10 w/Karen Lowell Guitar.

## 2012-01-18 ENCOUNTER — Other Ambulatory Visit (HOSPITAL_BASED_OUTPATIENT_CLINIC_OR_DEPARTMENT_OTHER): Payer: Medicare Other | Admitting: Lab

## 2012-01-18 ENCOUNTER — Ambulatory Visit (HOSPITAL_COMMUNITY)
Admission: RE | Admit: 2012-01-18 | Discharge: 2012-01-18 | Disposition: A | Discharge status: Home / Self Care | Payer: Medicare Other | Source: Ambulatory Visit | Attending: Oncology | Admitting: Oncology

## 2012-01-18 DIAGNOSIS — R911 Solitary pulmonary nodule: Secondary | ICD-10-CM | POA: Insufficient documentation

## 2012-01-18 DIAGNOSIS — C4359 Malignant melanoma of other part of trunk: Secondary | ICD-10-CM | POA: Insufficient documentation

## 2012-01-18 DIAGNOSIS — C50919 Malignant neoplasm of unspecified site of unspecified female breast: Secondary | ICD-10-CM

## 2012-01-18 LAB — COMPREHENSIVE METABOLIC PANEL (CC13)
ALT: 24 U/L (ref 0–55)
AST: 21 U/L (ref 5–34)
Albumin: 3.5 g/dL (ref 3.5–5.0)
Alkaline Phosphatase: 77 U/L (ref 40–150)
BUN: 17 mg/dL (ref 7.0–26.0)
CO2: 29 mEq/L (ref 22–29)
Calcium: 9.5 mg/dL (ref 8.4–10.4)
Chloride: 104 mEq/L (ref 98–107)
Creatinine: 0.7 mg/dL (ref 0.6–1.1)
Glucose: 124 mg/dl — ABNORMAL HIGH (ref 70–99)
Potassium: 3.6 mEq/L (ref 3.5–5.1)
Sodium: 141 mEq/L (ref 136–145)
Total Bilirubin: 0.39 mg/dL (ref 0.20–1.20)
Total Protein: 7 g/dL (ref 6.4–8.3)

## 2012-01-18 MED ORDER — IOHEXOL 300 MG/ML  SOLN
80.0000 mL | Freq: Once | INTRAMUSCULAR | Status: AC | PRN
  Administered 2012-01-18: 80 mL via INTRAVENOUS

## 2012-01-24 ENCOUNTER — Encounter (HOSPITAL_COMMUNITY): Payer: Medicare Other | Attending: Oncology | Admitting: Oncology

## 2012-01-24 ENCOUNTER — Encounter (HOSPITAL_COMMUNITY): Payer: Self-pay | Admitting: Oncology

## 2012-01-24 VITALS — BP 103/56 | HR 78 | Temp 98.1°F | Resp 16 | Wt 127.6 lb

## 2012-01-24 DIAGNOSIS — C4359 Malignant melanoma of other part of trunk: Secondary | ICD-10-CM | POA: Insufficient documentation

## 2012-01-24 NOTE — Progress Notes (Signed)
Problem #1 stage III melanoma the left upper back with a positive sentinel lymph node for a microscopic metastasis. Her CAT scan done the other day shows no evidence of new nodularity or change in the previously present small nodules. There is no adenopathy seen either on the supraclavicular mediastinal or axillary lymph node bearing areas.  Her physical exam is also stable. Vital signs are stable. Again she has no changes to the skin on the back especially around the large left upper back incision site. She has no lymphadenopathy in the cervical, supraclavicular, infraclavicular, or axillary areas.  Therefore we will see her back in 3 months for physical exam. I will communicate with Dr. Irene Pap at [facility] to see if she would like to do a CAT scan every 3 months or changed every 6 months for the next 2 years. A PET scan could also be considered once a year I suspect. I will also keep Dr. Donell Beers informed.

## 2012-01-24 NOTE — Patient Instructions (Addendum)
[facility]  Discharge Instructions  RECOMMENDATIONS MADE BY THE CONSULTANT AND ANY TEST RESULTS WILL BE SENT TO YOUR REFERRING DOCTOR.   EXAM FINDINGS BY MD TODAY AND SIGNS AND SYMPTOMS TO REPORT TO CLINIC OR PRIMARY MD:   Return in 3 months for labs and to see Dr. Mariel Sleet  I acknowledge that I have been informed and understand all the instructions given to me and received a copy. I do not have any more questions at this time, but understand that I may call the Specialty Clinic at [facility] at [phone] during business hours should I have any further questions or need assistance in obtaining follow-up care.    __________________________________________  _____________  __________ Signature of Patient or Authorized Representative            Date                   Time    __________________________________________ Nurse's Signature

## 2012-02-22 ENCOUNTER — Encounter: Payer: Self-pay | Admitting: Genetic Counselor

## 2012-02-22 ENCOUNTER — Ambulatory Visit (HOSPITAL_BASED_OUTPATIENT_CLINIC_OR_DEPARTMENT_OTHER): Payer: Medicare Other | Admitting: Genetic Counselor

## 2012-02-22 ENCOUNTER — Other Ambulatory Visit: Payer: Medicare Other | Admitting: Lab

## 2012-02-22 DIAGNOSIS — C4359 Malignant melanoma of other part of trunk: Secondary | ICD-10-CM

## 2012-02-22 DIAGNOSIS — C50919 Malignant neoplasm of unspecified site of unspecified female breast: Secondary | ICD-10-CM

## 2012-02-22 NOTE — Progress Notes (Signed)
Dr.  Glenford Peers requested a consultation for genetic counseling and risk assessment for Tanya Mitchell, a 67 y.o. female, for discussion of her personal history of bilateral breast cancer and melanoma. She presents to clinic today to discuss the possibility of a genetic predisposition to cancer, and to further clarify her risks, as well as her family members' risks for cancer.   HISTORY OF PRESENT ILLNESS: In 1995 and 2007, at the age of 76 and 26, Tanya Mitchell was diagnosed with invasive ductal carcinoma.  In 2013, at the age 11, she was diagnosed with melanoma.    Past Medical History  Diagnosis Date  . Cardiomyopathy secondary to chemotherapy     has improved   . Hyperlipidemia   . Hypertension     under control, has been on med. x 3 yrs.  . Breast cancer 1995 and 2007  . Melanoma in situ of back 09/2011    left  . Skin cancer 2013    Melanoma    Past Surgical History  Procedure Date  . Breast lumpectomy 12/21/2005    right  . Mastectomy 1995    with breast reconstruction - left    History  Substance Use Topics  . Smoking status: Former Smoker    Quit date: 02/14/1964  . Smokeless tobacco: Never Used  . Alcohol Use: 0.0 oz/week     Comment: daily glass of wine    REPRODUCTIVE HISTORY AND PERSONAL RISK ASSESSMENT FACTORS: Menarche was 13   Postmenopausal Uterus Intact: Yes Ovaries Intact: yes G2P2A0 , first live birth at age 72  She has not previously undergone treatment for infertility.   OCP use for 10 years   She has not used HRT in the past.    FAMILY HISTORY:  We obtained a detailed, 4-generation family history.  Significant diagnoses are listed below: Family History  Problem Relation Age of Onset  . Lung cancer Father   . Cancer Father     lung  . Autism Son   Her father died of lung cancer at age 6.  He had one full sister and one maternal half sister.  His half sister was a smoker and died of complications from that.  Her paternal grandmother  died of an unknown cancer.  The patient's mother was healthy and died at age 46.  The patient's maternal uncle also had an unknown cancer and died at age 48.  Patient's maternal ancestors are of [place] descent, and paternal ancestors are of English descent. There is not reported Ashkenazi Jewish ancestry. There is no  known consanguinity.  GENETIC COUNSELING RISK ASSESSMENT, DISCUSSION, AND SUGGESTED FOLLOW UP: We reviewed the natural history and genetic etiology of sporadic, familial and hereditary cancer syndromes.  About 5-10% of breast cancer is hereditary.  Of this, about 85% is the result of a BRCA1 or BRCA2 mutation. We reviewed the red flags of hereditary cancer syndromes and the dominant inheritance patterns.  If the BRCA testing is negative, we discussed that we could be testing for the wrong gene.  We discussed gene panels, and that several cancer genes that are associated with different cancers can be tested at the same time.  Because of the different types of cancer that the patient has had, we will consider one of the panel tests if she is negative for BRCA mutations.   The patient's personal history of breast cancer and melanom is suggestive of the following possible diagnosis: hereditary cancer syndrome  We discussed that identification  of a hereditary cancer syndrome may help her care providers tailor the patients medical management. If a mutation indicating a hereditary cancer syndrome is detected in this case, the [employer] recommendations would include increased cancer surveillance and possible prophylactic surgery. If a mutation is detected, the patient will be referred back to the referring provider and to any additional appropriate care providers to discuss the relevant options.   If a mutation is not found in the patient, this will decrease the likelihood of a hereditary cancer syndroem as the explanation for her breast cancer. Cancer surveillance  options would be discussed for the patient according to the appropriate standard National Comprehensive Cancer Network and American Cancer Society guidelines, with consideration of their personal and family history risk factors. In this case, the patient will be referred back to their care providers for discussions of management.   After considering the risks, benefits, and limitations, the patient provided informed consent for  the following  testing: BRACAnalysis and MyRisk through [employer].   Per the patient's request, we will contact her by telephone to discuss these results. A follow up genetic counseling visit will be scheduled if indicated.  The patient was seen for a total of 60 minutes, greater than 50% of which was spent face-to-face counseling.  This plan is being carried out per Dr. Glenford Peers recommendations.  This note will also be sent to the referring provider via the electronic medical record. The patient will be supplied with a summary of this genetic counseling discussion as well as educational information on the discussed hereditary cancer syndromes following the conclusion of their visit.   Patient was discussed with Dr. Drue Second.   _______________________________________________________________________ For Office Staff:  Number of people involved in session: 2 Was an Intern/ student involved with case: no

## 2012-03-07 ENCOUNTER — Telehealth: Payer: Self-pay | Admitting: Genetic Counselor

## 2012-03-07 NOTE — Telephone Encounter (Signed)
Left message that her test had been cancelled.  [facility] was unable to bill her twice for the same test, since she had previously gone through them for BRCA testing.  We can either wait until they have set up their system so that they can bill twice which will be in the fall sometime, or redraw her test and use a different testing lab.  Asked for her to [facility] and we can discuss.

## 2012-03-19 ENCOUNTER — Encounter (INDEPENDENT_AMBULATORY_CARE_PROVIDER_SITE_OTHER): Payer: Self-pay | Admitting: General Surgery

## 2012-03-28 ENCOUNTER — Ambulatory Visit: Payer: Medicare Other | Admitting: Cardiology

## 2012-03-30 ENCOUNTER — Other Ambulatory Visit: Payer: Self-pay

## 2012-04-02 ENCOUNTER — Telehealth (INDEPENDENT_AMBULATORY_CARE_PROVIDER_SITE_OTHER): Payer: Self-pay

## 2012-04-02 ENCOUNTER — Other Ambulatory Visit: Payer: Medicare Other | Admitting: Lab

## 2012-04-02 NOTE — Telephone Encounter (Signed)
LMOV pt's appt moved up on 2/27 from 3:40pm to 10:30am.

## 2012-04-05 ENCOUNTER — Other Ambulatory Visit: Payer: Self-pay

## 2012-04-11 ENCOUNTER — Telehealth (INDEPENDENT_AMBULATORY_CARE_PROVIDER_SITE_OTHER): Payer: Self-pay

## 2012-04-11 ENCOUNTER — Encounter (INDEPENDENT_AMBULATORY_CARE_PROVIDER_SITE_OTHER): Payer: Self-pay | Admitting: General Surgery

## 2012-04-11 ENCOUNTER — Ambulatory Visit (INDEPENDENT_AMBULATORY_CARE_PROVIDER_SITE_OTHER): Payer: Medicare Other | Admitting: General Surgery

## 2012-04-11 ENCOUNTER — Other Ambulatory Visit (INDEPENDENT_AMBULATORY_CARE_PROVIDER_SITE_OTHER): Payer: Self-pay | Admitting: General Surgery

## 2012-04-11 VITALS — BP 120/80 | HR 60 | Temp 97.2°F | Resp 18 | Ht 62.0 in | Wt 128.0 lb

## 2012-04-11 DIAGNOSIS — C4359 Malignant melanoma of other part of trunk: Secondary | ICD-10-CM

## 2012-04-11 DIAGNOSIS — Z85820 Personal history of malignant melanoma of skin: Secondary | ICD-10-CM

## 2012-04-11 DIAGNOSIS — N63 Unspecified lump in unspecified breast: Secondary | ICD-10-CM

## 2012-04-11 NOTE — Assessment & Plan Note (Signed)
No clinical evidence of disease.  Will get left axillary ultrasound to follow lymph nodes.  If concerning nodes arise, can biopsy percutaneously or proceed to lymphadenectomy  Pt has follow up with Dr. Mariel Sleet next month.  Will likely get CT or PET with him.

## 2012-04-11 NOTE — Patient Instructions (Signed)
We will order left axillary ultrasound.    Follow up with me in 6 months.

## 2012-04-11 NOTE — Telephone Encounter (Signed)
Pt's [place] scheduled 04/18/12 at 11:00am at [facility].

## 2012-04-11 NOTE — Progress Notes (Signed)
HISTORY: Patient is a 67 year old female who had a bilateral excision of a back melanoma with sentinel lymph node positivity. The sentinel lymph node had micrometastases. We discussed completion axillary lymph node dissection, and were not planning on doing it. She got evaluated by Trinda Pascal at [facility] whorecommend a completion dissection. Mrs. Wirick and I discussed this and I explained the rationale. She did not want to undergo that procedure as she already had with the dissection on the opposite side for breast cancer.  She is not having any symptoms.  She has been to her dermatologist recently.  She had a few areas biopsied, and none were melanoma.     PERTINENT REVIEW OF SYSTEMS: Negative x11   EXAM: Head: Normocephalic and atraumatic.  Eyes:  Conjunctivae are normal. Pupils are equal, round, and reactive to light. No scleral icterus.  Neck:  Normal range of motion. Neck supple. No tracheal deviation present. No thyromegaly present.  Resp: No respiratory distress, normal effort. Abd:  Abdomen is soft, non distended and non tender. No masses are palpable.  There is no rebound and no guarding.  Neurological: Alert and oriented to person, place, and time. Coordination normal.  Skin: Skin is warm and dry. No rash noted. No diaphoretic. No erythema. No pallor. Back scar did stretch a little, but has no nodules or pigment.   No axillary or cervical, supra or infraclavicular adenopathy. Psychiatric: Normal mood and affect. Normal behavior. Judgment and thought content normal.      ASSESSMENT AND PLAN:   Melanoma of back, pT2NxMx No clinical evidence of disease.  Will get left axillary ultrasound to follow lymph nodes.  If concerning nodes arise, can biopsy percutaneously or proceed to lymphadenectomy  Pt has follow up with Dr. Mariel Sleet next month.  Will likely get CT or PET with him.        Maudry Diego, MD Surgical Oncology, General & Endocrine Surgery [facility]  Gaye Alken, MD Marthe Patch*

## 2012-04-18 ENCOUNTER — Other Ambulatory Visit: Payer: Medicare Other

## 2012-04-19 ENCOUNTER — Other Ambulatory Visit: Payer: Medicare Other

## 2012-04-22 ENCOUNTER — Other Ambulatory Visit: Payer: Self-pay | Admitting: *Deleted

## 2012-04-22 MED ORDER — HYDROCHLOROTHIAZIDE 25 MG PO TABS: 25.0000 mg | ORAL_TABLET | Freq: Every day | ORAL | Status: DC

## 2012-04-22 MED ORDER — CARVEDILOL 12.5 MG PO TABS: 12.5000 mg | ORAL_TABLET | Freq: Two times a day (BID) | ORAL | Status: DC

## 2012-04-22 MED ORDER — RAMIPRIL 10 MG PO TABS: 10.0000 mg | ORAL_TABLET | Freq: Every day | ORAL | Status: DC

## 2012-04-23 ENCOUNTER — Encounter (HOSPITAL_COMMUNITY): Payer: Medicare Other | Attending: Oncology | Admitting: Oncology

## 2012-04-23 ENCOUNTER — Encounter (HOSPITAL_COMMUNITY): Payer: Self-pay | Admitting: Oncology

## 2012-04-23 ENCOUNTER — Other Ambulatory Visit (HOSPITAL_COMMUNITY): Payer: Medicare Other

## 2012-04-23 VITALS — BP 123/73 | HR 75 | Temp 98.0°F | Resp 16 | Wt 129.2 lb

## 2012-04-23 DIAGNOSIS — C4359 Malignant melanoma of other part of trunk: Secondary | ICD-10-CM

## 2012-04-23 DIAGNOSIS — C779 Secondary and unspecified malignant neoplasm of lymph node, unspecified: Secondary | ICD-10-CM

## 2012-04-23 LAB — COMPREHENSIVE METABOLIC PANEL
ALT: 26 U/L (ref 0–35)
AST: 24 U/L (ref 0–37)
Albumin: 4.1 g/dL (ref 3.5–5.2)
Alkaline Phosphatase: 75 U/L (ref 39–117)
BUN: 15 mg/dL (ref 6–23)
CO2: 30 mEq/L (ref 19–32)
Calcium: 10.4 mg/dL (ref 8.4–10.5)
Chloride: 99 mEq/L (ref 96–112)
Creatinine, Ser: 0.58 mg/dL (ref 0.50–1.10)
GFR calc Af Amer: >90 mL/min (ref >90)
GFR calc non Af Amer: >90 mL/min (ref >90)
Glucose, Bld: 102 mg/dL — ABNORMAL HIGH (ref 70–99)
Potassium: 3.8 mEq/L (ref 3.5–5.1)
Sodium: 140 mEq/L (ref 135–145)
Total Bilirubin: 0.5 mg/dL (ref 0.3–1.2)
Total Protein: 7.6 g/dL (ref 6.0–8.3)

## 2012-04-23 LAB — CBC WITH DIFFERENTIAL/PLATELET
Basophils Absolute: 0.1 10*3/uL (ref 0.0–0.1)
Basophils Relative: 1 % (ref 0–1)
Eosinophils Absolute: 0.3 10*3/uL (ref 0.0–0.7)
Eosinophils Relative: 5 % (ref 0–5)
HCT: 40.6 % (ref 36.0–46.0)
Hemoglobin: 13.9 g/dL (ref 12.0–15.0)
Lymphocytes Relative: 35 % (ref 12–46)
Lymphs Abs: 2.4 10*3/uL (ref 0.7–4.0)
MCH: 29.3 pg (ref 26.0–34.0)
MCHC: 34.2 g/dL (ref 30.0–36.0)
MCV: 85.7 fL (ref 78.0–100.0)
Monocytes Absolute: 0.6 10*3/uL (ref 0.1–1.0)
Monocytes Relative: 9 % (ref 3–12)
Neutro Abs: 3.6 10*3/uL (ref 1.7–7.7)
Neutrophils Relative %: 51 % (ref 43–77)
Platelets: 356 10*3/uL (ref 150–400)
RBC: 4.74 MIL/uL (ref 3.87–5.11)
RDW: 13.3 % (ref 11.5–15.5)
WBC: 7 10*3/uL (ref 4.0–10.5)

## 2012-04-23 NOTE — Progress Notes (Signed)
Chapman Fitch Keathley presented for [employer]. Labs per MD order drawn via Peripheral Line 23 gauge needle inserted in right AC  Good blood return present. Procedure without incident.  Needle removed intact. Patient tolerated procedure well.

## 2012-04-23 NOTE — Patient Instructions (Addendum)
[facility] Discharge Instructions  RECOMMENDATIONS MADE BY THE CONSULTANT AND ANY TEST RESULTS WILL BE SENT TO YOUR REFERRING PHYSICIAN.  EXAM FINDINGS BY THE PHYSICIAN TODAY AND SIGNS OR SYMPTOMS TO REPORT TO CLINIC OR PRIMARY PHYSICIAN: Exam and discussion by MD.  No evidence of recurrence by exam.  Call us after you have the ultrasound so MD can look for the result.  Tobie Lords, RN  [phone])  MEDICATIONS PRESCRIBED:  none  INSTRUCTIONS GIVEN AND DISCUSSED: Report any new lumps, bone pain, shortness of breath, unusual looking skin lesions, etc.  SPECIAL INSTRUCTIONS/FOLLOW-UP: CT in June and to see MD next day.  Thank you for choosing [facility] to provide your oncology and hematology care.  To afford each patient quality time with our providers, please arrive at least 15 minutes before your scheduled appointment time.  With your help, our goal is to use those 15 minutes to complete the necessary work-up to ensure our physicians have the information they need to help with your evaluation and healthcare recommendations.    Effective January 1st, 2014, we ask that you re-schedule your appointment with our physicians should you arrive 10 or more minutes late for your appointment.  We strive to give you quality time with our providers, and arriving late affects you and other patients whose appointments are after yours.    Again, thank you for choosing [facility].  Our hope is that these requests will decrease the amount of time that you wait before being seen by our physicians.       _____________________________________________________________  Should you have questions after your visit to [facility], please contact our office at [phone] between the hours of 8:30 a.m. and 5:00 p.m.  Voicemails left after 4:30 p.m. will not be returned until the following business day.  For prescription refill requests, have your  pharmacy contact our office with your prescription refill request.
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
## 2012-04-25 ENCOUNTER — Ambulatory Visit
Admission: RE | Admit: 2012-04-25 | Discharge: 2012-04-25 | Disposition: A | Discharge status: Home / Self Care | Payer: Medicare Other | Source: Ambulatory Visit | Attending: General Surgery | Admitting: General Surgery

## 2012-04-25 DIAGNOSIS — N63 Unspecified lump in unspecified breast: Secondary | ICD-10-CM

## 2012-04-25 DIAGNOSIS — Z85820 Personal history of malignant melanoma of skin: Secondary | ICD-10-CM

## 2012-04-26 ENCOUNTER — Telehealth (HOSPITAL_COMMUNITY): Payer: Self-pay

## 2012-04-26 NOTE — Telephone Encounter (Signed)
Call from [place].  Wanted to let Dr. Mariel Sleet know that ultrasound was ok.

## 2012-04-29 ENCOUNTER — Telehealth (INDEPENDENT_AMBULATORY_CARE_PROVIDER_SITE_OTHER): Payer: Self-pay

## 2012-04-29 ENCOUNTER — Ambulatory Visit: Payer: Self-pay | Admitting: Cardiology

## 2012-04-29 NOTE — Telephone Encounter (Signed)
Called patient and gave her negative [place] results.

## 2012-04-29 NOTE — Telephone Encounter (Signed)
Message copied by Brennan Bailey on Mon Apr 29, 2012  3:14 PM ------      Message from: Almond Lint      Created: Fri Apr 26, 2012 11:38 AM       Please let ms Sargent know axillary ultrasound is negative.      Tx      FB ------

## 2012-05-10 ENCOUNTER — Encounter: Payer: Self-pay | Admitting: Cardiology

## 2012-05-10 ENCOUNTER — Ambulatory Visit (INDEPENDENT_AMBULATORY_CARE_PROVIDER_SITE_OTHER): Payer: Medicare Other | Admitting: Cardiology

## 2012-05-10 VITALS — BP 118/64 | HR 79 | Ht 62.0 in | Wt 130.0 lb

## 2012-05-10 DIAGNOSIS — I428 Other cardiomyopathies: Secondary | ICD-10-CM

## 2012-05-10 DIAGNOSIS — I10 Essential (primary) hypertension: Secondary | ICD-10-CM

## 2012-05-10 MED ORDER — HYDROCHLOROTHIAZIDE 25 MG PO TABS: 25.0000 mg | ORAL_TABLET | Freq: Every day | ORAL | Status: DC

## 2012-05-10 NOTE — Patient Instructions (Addendum)
Your physician has requested that you have an echocardiogram in May 2014. Echocardiography is a painless test that uses sound waves to create images of your heart. It provides your doctor with information about the size and shape of your heart and how well your heart's chambers and valves are working. This procedure takes approximately one hour. There are no restrictions for this procedure.  Your physician wants you to follow-up in: 1 year with Dr. Shirlee Latch. You will receive a reminder letter in the mail two months in advance. If you don't receive a letter, please call our office to schedule the follow-up appointment.

## 2012-05-12 DIAGNOSIS — I10 Essential (primary) hypertension: Secondary | ICD-10-CM | POA: Insufficient documentation

## 2012-05-12 NOTE — Progress Notes (Signed)
Patient ID: Tanya Mitchell, female   DOB: [birth year], 67 y.o.   MRN: [id number] PCP: Dr. Zachery Dauer  67 yo with history of chemotherapy-induced cardiomyopathy presents for cardiology followup.  She has seen Dr. Deborah Chalk in the past and is seeing me for the first time today.  She had breast cancer and was treated with Adriamycin, developing a cardiomyopathy.  However, last echo after medical treatment showed EF back up to 55% (this was in 2011).  She has been doing well recently.  She is now retired.  No exertional dyspnea or chest pain.  No palpitations.    ECG: NSR, 1st degree AV block  Labs (3/14): K 3.8, creatinine 0.58  PMH: 1. Cardiomyopathy: Thought to be secondary to adriamycin use.  Last echo in 2011 showed EF improved back to 55%.  2. Breast cancer: s/p bilateral mastectomy.  3. Melanoma: Diagnosed 8/13, s/p excision.  4. HTN 5. Hyperlipidemia  SH: Originally from [place].  Retired from [employer].  Lives in [place].  Has deaf and autistic son.  Married.  Quit smoking in 1966, occasional ETOH.   FH: No coronary artery disease.   ROS: All systems reviewed and negative except as per HPI.   Current Outpatient Prescriptions  Medication Sig Dispense Refill  . aspirin 325 MG tablet Take 325 mg by mouth daily. 1/2 tablet daily      . atorvastatin (LIPITOR) 40 MG tablet Take 40 mg by mouth daily.      . calcium carbonate (OS-CAL) 600 MG TABS Take 600 mg by mouth 2 (two) times daily with a meal.      . carvedilol (COREG) 12.5 MG tablet Take 1 tablet (12.5 mg total) by mouth 2 (two) times daily.  180 tablet  0  . cholecalciferol (VITAMIN D) 1000 UNITS tablet Take 1,000 Units by mouth daily.      . hydrochlorothiazide (HYDRODIURIL) 25 MG tablet Take 1 tablet (25 mg total) by mouth daily.  90 tablet  0  . ramipril (ALTACE) 10 MG tablet Take 1 tablet (10 mg total) by mouth daily.  90 tablet  0   No current facility-administered medications for this visit.    BP 118/64  Pulse 79   Ht 5\' 2"  (1.575 m)  Wt 130 lb (58.968 kg)  BMI 23.77 kg/m2 General: NAD Neck: No JVD, no thyromegaly or thyroid nodule.  Lungs: Clear to auscultation bilaterally with normal respiratory effort. CV: Nondisplaced PMI.  Heart regular S1/S2, no S3/S4, no murmur.  No peripheral edema.  No carotid bruit.  Normal pedal pulses.  Abdomen: Soft, nontender, no hepatosplenomegaly, no distention.  Neurologic: Alert and oriented x 3.  Psych: Normal affect. Extremities: No clubbing or cyanosis.   Assessment/Plan: 1. History of Adriamycin-induced cardiomyopathy: She is on Coreg and ramipril, which she will continue.  I will get an echo to make sure that EF remains preserved.  2. HTN: BP is under good control.   3. Followup in 1 year if no issues come up.   Marca Ancona 05/12/2012

## 2012-05-23 ENCOUNTER — Telehealth: Payer: Self-pay | Admitting: Genetic Counselor

## 2012-05-23 NOTE — Telephone Encounter (Signed)
Left VM message with good news on test results

## 2012-05-30 ENCOUNTER — Telehealth: Payer: Self-pay | Admitting: Genetic Counselor

## 2012-05-30 NOTE — Telephone Encounter (Signed)
Left good news message on home and mobile number.

## 2012-06-06 ENCOUNTER — Telehealth: Payer: Self-pay | Admitting: Genetic Counselor

## 2012-06-06 NOTE — Telephone Encounter (Signed)
Left good news message on VM and asked that she call me back.

## 2012-06-14 ENCOUNTER — Other Ambulatory Visit: Payer: Self-pay | Admitting: *Deleted

## 2012-06-14 ENCOUNTER — Encounter: Payer: Self-pay | Admitting: Genetic Counselor

## 2012-06-14 MED ORDER — HYDROCHLOROTHIAZIDE 25 MG PO TABS: 25.0000 mg | ORAL_TABLET | Freq: Every day | ORAL | Status: DC

## 2012-06-14 MED ORDER — CARVEDILOL 12.5 MG PO TABS: 12.5000 mg | ORAL_TABLET | Freq: Two times a day (BID) | ORAL | Status: DC

## 2012-06-14 MED ORDER — RAMIPRIL 10 MG PO TABS: 10.0000 mg | ORAL_TABLET | Freq: Every day | ORAL | Status: DC

## 2012-06-25 ENCOUNTER — Ambulatory Visit (HOSPITAL_COMMUNITY): Payer: Medicare Other | Attending: Cardiology | Admitting: Radiology

## 2012-06-25 DIAGNOSIS — I10 Essential (primary) hypertension: Secondary | ICD-10-CM | POA: Insufficient documentation

## 2012-06-25 DIAGNOSIS — I428 Other cardiomyopathies: Secondary | ICD-10-CM

## 2012-06-25 DIAGNOSIS — E785 Hyperlipidemia, unspecified: Secondary | ICD-10-CM | POA: Insufficient documentation

## 2012-06-25 DIAGNOSIS — F17200 Nicotine dependence, unspecified, uncomplicated: Secondary | ICD-10-CM | POA: Insufficient documentation

## 2012-06-25 DIAGNOSIS — I379 Nonrheumatic pulmonary valve disorder, unspecified: Secondary | ICD-10-CM | POA: Insufficient documentation

## 2012-06-25 DIAGNOSIS — I059 Rheumatic mitral valve disease, unspecified: Secondary | ICD-10-CM | POA: Insufficient documentation

## 2012-06-25 DIAGNOSIS — Z853 Personal history of malignant neoplasm of breast: Secondary | ICD-10-CM | POA: Insufficient documentation

## 2012-06-25 DIAGNOSIS — I079 Rheumatic tricuspid valve disease, unspecified: Secondary | ICD-10-CM | POA: Insufficient documentation

## 2012-06-25 NOTE — Progress Notes (Signed)
Echocardiogram performed.  

## 2012-07-11 ENCOUNTER — Encounter (HOSPITAL_COMMUNITY): Payer: Self-pay

## 2012-07-11 ENCOUNTER — Ambulatory Visit (HOSPITAL_COMMUNITY)
Admission: RE | Admit: 2012-07-11 | Discharge: 2012-07-11 | Disposition: A | Discharge status: Home / Self Care | Payer: Medicare Other | Source: Ambulatory Visit | Attending: Oncology | Admitting: Oncology

## 2012-07-11 DIAGNOSIS — K8689 Other specified diseases of pancreas: Secondary | ICD-10-CM | POA: Insufficient documentation

## 2012-07-11 DIAGNOSIS — Z923 Personal history of irradiation: Secondary | ICD-10-CM | POA: Insufficient documentation

## 2012-07-11 DIAGNOSIS — Z9221 Personal history of antineoplastic chemotherapy: Secondary | ICD-10-CM | POA: Insufficient documentation

## 2012-07-11 DIAGNOSIS — I428 Other cardiomyopathies: Secondary | ICD-10-CM | POA: Insufficient documentation

## 2012-07-11 DIAGNOSIS — Z9010 Acquired absence of unspecified breast and nipple: Secondary | ICD-10-CM | POA: Insufficient documentation

## 2012-07-11 DIAGNOSIS — I10 Essential (primary) hypertension: Secondary | ICD-10-CM | POA: Insufficient documentation

## 2012-07-11 DIAGNOSIS — C4359 Malignant melanoma of other part of trunk: Secondary | ICD-10-CM | POA: Insufficient documentation

## 2012-07-11 DIAGNOSIS — Z853 Personal history of malignant neoplasm of breast: Secondary | ICD-10-CM | POA: Insufficient documentation

## 2012-07-11 DIAGNOSIS — R918 Other nonspecific abnormal finding of lung field: Secondary | ICD-10-CM | POA: Insufficient documentation

## 2012-07-16 ENCOUNTER — Encounter (HOSPITAL_COMMUNITY): Payer: Self-pay | Admitting: Oncology

## 2012-07-16 ENCOUNTER — Encounter (HOSPITAL_COMMUNITY): Payer: Medicare Other | Attending: Oncology | Admitting: Oncology

## 2012-07-16 VITALS — BP 113/67 | HR 69 | Temp 98.7°F | Resp 16 | Wt 130.4 lb

## 2012-07-16 DIAGNOSIS — C779 Secondary and unspecified malignant neoplasm of lymph node, unspecified: Secondary | ICD-10-CM

## 2012-07-16 DIAGNOSIS — Z853 Personal history of malignant neoplasm of breast: Secondary | ICD-10-CM

## 2012-07-16 DIAGNOSIS — C4359 Malignant melanoma of other part of trunk: Secondary | ICD-10-CM

## 2012-07-16 DIAGNOSIS — C439 Malignant melanoma of skin, unspecified: Secondary | ICD-10-CM | POA: Insufficient documentation

## 2012-07-16 NOTE — Progress Notes (Signed)
#  1 stage III melanoma, left upper back with a positive sentinel lymph node with microscopic metastasis. PET scan in the past showed no obvious metastatic disease. CAT scans have been done in December and now June. There is no evidence for new nodules or any change in the already present lung nodules. There is no adenopathy etc. She remains asymptomatic on oncology review of systems.  #2 DCIS of the right breast status post biopsy followed by wide local excision on 11/28/2005 for a high-grade lesion, ER/PR negative. Should several areas within the biopsy the noncontiguous. She status post radiation therapy postoperatively with no evidence recurrent disease.  #3 ductal carcinoma the left breast status post mastectomy with reconstruction in June 1995 for cancer that was ER +97% PR +45%, grade 2, 1.5 cm (T2 N1) with one of 10 positive nodes. She participated in CALGB protocol [phone] with Adriamycin x3 cycles followed by sequential Cytoxan for 3 cycles followed by tamoxifen for 5 years finishing all therapy in November 2000 without recurrent disease.  #4 BRCA1 or BRCA2 negativity #5 hypercholesterolemia on Crestor with excellent control #6 history of Adriamycin induced heart dysfunction which is very stable #7 history of hypertension  This lady's main problem is the melanoma. Her CAT scan done just the other day is absolutely without change within the lungs etc. We therefore we'll set her up for a PET scan in December. Her blood work was also excellent recently.  Interestingly her 2-D echo is also stable. She is not symptomatic in any way.  Her vital signs are stable. Lymph nodes remain negative throughout clinically the cervical, supraclavicular, infraclavicular, axillary, and inguinal areas. Lungs are clear to auscultation and percussion. Heart shows a regular rhythm and rate without obvious murmur rub or gallop. Abdomen remains soft and nontender. Bowel sounds are normal. There is no obvious thyromegaly.  Skin exam is unremarkable. She is alert and oriented. She has no arm or leg edema. The left breast which is reconstructed is negative. The right breast is negative.  We'll see her back in December. She'll have blood work and a PET scan and physical exam.

## 2012-07-16 NOTE — Patient Instructions (Addendum)
[facility] Discharge Instructions  RECOMMENDATIONS MADE BY THE CONSULTANT AND ANY TEST RESULTS WILL BE SENT TO YOUR REFERRING PHYSICIAN.  Return to clinic early December 2014 for lab work. PET scan after lab work December. MD appointment after PET scan in December.  Thank you for choosing [facility] to provide your oncology and hematology care.  To afford each patient quality time with our providers, please arrive at least 15 minutes before your scheduled appointment time.  With your help, our goal is to use those 15 minutes to complete the necessary work-up to ensure our physicians have the information they need to help with your evaluation and healthcare recommendations.    Effective January 1st, 2014, we ask that you re-schedule your appointment with our physicians should you arrive 10 or more minutes late for your appointment.  We strive to give you quality time with our providers, and arriving late affects you and other patients whose appointments are after yours.    Again, thank you for choosing [facility].  Our hope is that these requests will decrease the amount of time that you wait before being seen by our physicians.       _____________________________________________________________  Should you have questions after your visit to [facility], please contact our office at [phone] between the hours of 8:30 a.m. and 5:00 p.m.  Voicemails left after 4:30 p.m. will not be returned until the following business day.  For prescription refill requests, have your pharmacy contact our office with your prescription refill request.

## 2012-07-22 ENCOUNTER — Other Ambulatory Visit (HOSPITAL_COMMUNITY): Payer: Self-pay | Admitting: Oncology

## 2012-07-22 DIAGNOSIS — Z853 Personal history of malignant neoplasm of breast: Secondary | ICD-10-CM

## 2012-08-15 ENCOUNTER — Ambulatory Visit
Admission: RE | Admit: 2012-08-15 | Discharge: 2012-08-15 | Disposition: A | Discharge status: Home / Self Care | Payer: Medicare Other | Source: Ambulatory Visit | Attending: Oncology | Admitting: Oncology

## 2012-08-15 DIAGNOSIS — Z853 Personal history of malignant neoplasm of breast: Secondary | ICD-10-CM

## 2012-09-18 ENCOUNTER — Other Ambulatory Visit: Payer: Self-pay

## 2012-12-19 ENCOUNTER — Other Ambulatory Visit: Payer: Self-pay

## 2012-12-20 ENCOUNTER — Other Ambulatory Visit: Payer: Self-pay | Admitting: Obstetrics and Gynecology

## 2012-12-30 ENCOUNTER — Telehealth: Payer: Self-pay | Admitting: *Deleted

## 2012-12-30 NOTE — Telephone Encounter (Signed)
Pt has requested to switch to Dr. Darnelle Catalan since Dr. Mariel Sleet is no longer at [facility].  Confirmed 01/30/13 appt w/ pt.  Gave pt before letter, welcome packet & intake form.  Took paperwork to Med Rec for chart.

## 2013-01-06 ENCOUNTER — Other Ambulatory Visit: Payer: Self-pay | Admitting: *Deleted

## 2013-01-06 ENCOUNTER — Encounter: Payer: Self-pay | Admitting: *Deleted

## 2013-01-06 DIAGNOSIS — D0511 Intraductal carcinoma in situ of right breast: Secondary | ICD-10-CM | POA: Insufficient documentation

## 2013-01-06 DIAGNOSIS — C50911 Malignant neoplasm of unspecified site of right female breast: Secondary | ICD-10-CM

## 2013-01-06 NOTE — Progress Notes (Signed)
Received chart back from Dawn and placed in Dr. Magrinat's box.  

## 2013-01-06 NOTE — Progress Notes (Signed)
Completed chart and gave to [facility] to enter labs and give back to me so I can give to MD.

## 2013-01-14 ENCOUNTER — Other Ambulatory Visit (HOSPITAL_BASED_OUTPATIENT_CLINIC_OR_DEPARTMENT_OTHER): Payer: Medicare Other | Admitting: Lab

## 2013-01-14 DIAGNOSIS — C50911 Malignant neoplasm of unspecified site of right female breast: Secondary | ICD-10-CM

## 2013-01-14 DIAGNOSIS — C50919 Malignant neoplasm of unspecified site of unspecified female breast: Secondary | ICD-10-CM

## 2013-01-14 LAB — CBC WITH DIFFERENTIAL/PLATELET
BASO%: 1.2 % (ref 0.0–2.0)
Basophils Absolute: 0.1 10*3/uL (ref 0.0–0.1)
EOS%: 4.1 % (ref 0.0–7.0)
Eosinophils Absolute: 0.3 10*3/uL (ref 0.0–0.5)
HCT: 40.5 % (ref 34.8–46.6)
HGB: 13.5 g/dL (ref 11.6–15.9)
LYMPH%: 34.2 % (ref 14.0–49.7)
MCH: 29.4 pg (ref 25.1–34.0)
MCHC: 33.4 g/dL (ref 31.5–36.0)
MCV: 88.1 fL (ref 79.5–101.0)
MONO#: 0.6 10*3/uL (ref 0.1–0.9)
MONO%: 8 % (ref 0.0–14.0)
NEUT#: 3.9 10*3/uL (ref 1.5–6.5)
NEUT%: 52.5 % (ref 38.4–76.8)
Platelets: 348 10*3/uL (ref 145–400)
RBC: 4.59 10*6/uL (ref 3.70–5.45)
RDW: 13.3 % (ref 11.2–14.5)
WBC: 7.4 10*3/uL (ref 3.9–10.3)
lymph#: 2.5 10*3/uL (ref 0.9–3.3)

## 2013-01-14 LAB — COMPREHENSIVE METABOLIC PANEL (CC13)
ALT: 27 U/L (ref 0–55)
AST: 23 U/L (ref 5–34)
Albumin: 3.7 g/dL (ref 3.5–5.0)
Alkaline Phosphatase: 69 U/L (ref 40–150)
Anion Gap: 10 mEq/L (ref 3–11)
BUN: 14.6 mg/dL (ref 7.0–26.0)
CO2: 27 mEq/L (ref 22–29)
Calcium: 9.7 mg/dL (ref 8.4–10.4)
Chloride: 104 mEq/L (ref 98–109)
Creatinine: 0.7 mg/dL (ref 0.6–1.1)
Glucose: 120 mg/dl (ref 70–140)
Potassium: 3.8 mEq/L (ref 3.5–5.1)
Sodium: 141 mEq/L (ref 136–145)
Total Bilirubin: 0.45 mg/dL (ref 0.20–1.20)
Total Protein: 7 g/dL (ref 6.4–8.3)

## 2013-01-17 ENCOUNTER — Encounter (HOSPITAL_COMMUNITY): Payer: Medicare Other

## 2013-01-20 ENCOUNTER — Other Ambulatory Visit: Payer: Self-pay | Admitting: *Deleted

## 2013-01-21 ENCOUNTER — Other Ambulatory Visit: Payer: Self-pay | Admitting: Oncology

## 2013-01-21 DIAGNOSIS — C439 Malignant melanoma of skin, unspecified: Secondary | ICD-10-CM

## 2013-01-24 ENCOUNTER — Ambulatory Visit (HOSPITAL_COMMUNITY): Payer: Medicare Other

## 2013-01-28 ENCOUNTER — Encounter (HOSPITAL_COMMUNITY)
Admission: RE | Admit: 2013-01-28 | Discharge: 2013-01-28 | Disposition: A | Discharge status: Home / Self Care | Payer: Medicare Other | Source: Ambulatory Visit | Attending: Oncology | Admitting: Oncology

## 2013-01-28 ENCOUNTER — Encounter (HOSPITAL_COMMUNITY): Payer: Self-pay

## 2013-01-28 DIAGNOSIS — C773 Secondary and unspecified malignant neoplasm of axilla and upper limb lymph nodes: Secondary | ICD-10-CM | POA: Insufficient documentation

## 2013-01-28 DIAGNOSIS — Z90710 Acquired absence of both cervix and uterus: Secondary | ICD-10-CM | POA: Insufficient documentation

## 2013-01-28 DIAGNOSIS — Z853 Personal history of malignant neoplasm of breast: Secondary | ICD-10-CM | POA: Insufficient documentation

## 2013-01-28 DIAGNOSIS — K5730 Diverticulosis of large intestine without perforation or abscess without bleeding: Secondary | ICD-10-CM | POA: Insufficient documentation

## 2013-01-28 DIAGNOSIS — C439 Malignant melanoma of skin, unspecified: Secondary | ICD-10-CM

## 2013-01-28 DIAGNOSIS — C4359 Malignant melanoma of other part of trunk: Secondary | ICD-10-CM | POA: Insufficient documentation

## 2013-01-28 LAB — GLUCOSE, CAPILLARY: Glucose-Capillary: 145 mg/dL — ABNORMAL HIGH (ref 70–99)

## 2013-01-28 MED ORDER — FLUDEOXYGLUCOSE F - 18 (FDG) INJECTION
18.1000 | Freq: Once | INTRAVENOUS | Status: AC | PRN
  Administered 2013-01-28: 18.1 via INTRAVENOUS

## 2013-01-29 ENCOUNTER — Other Ambulatory Visit: Payer: Self-pay | Admitting: Physician Assistant

## 2013-01-29 DIAGNOSIS — C50911 Malignant neoplasm of unspecified site of right female breast: Secondary | ICD-10-CM

## 2013-01-30 ENCOUNTER — Other Ambulatory Visit (HOSPITAL_BASED_OUTPATIENT_CLINIC_OR_DEPARTMENT_OTHER): Payer: Medicare Other

## 2013-01-30 ENCOUNTER — Encounter: Payer: Self-pay | Admitting: Oncology

## 2013-01-30 ENCOUNTER — Ambulatory Visit: Payer: Medicare Other

## 2013-01-30 ENCOUNTER — Ambulatory Visit (HOSPITAL_BASED_OUTPATIENT_CLINIC_OR_DEPARTMENT_OTHER): Payer: Medicare Other | Admitting: Oncology

## 2013-01-30 VITALS — BP 138/76 | HR 82 | Temp 98.0°F | Resp 18 | Ht 62.0 in | Wt 127.5 lb

## 2013-01-30 DIAGNOSIS — Z853 Personal history of malignant neoplasm of breast: Secondary | ICD-10-CM

## 2013-01-30 DIAGNOSIS — C50911 Malignant neoplasm of unspecified site of right female breast: Secondary | ICD-10-CM

## 2013-01-30 DIAGNOSIS — C4359 Malignant melanoma of other part of trunk: Secondary | ICD-10-CM

## 2013-01-30 LAB — CBC WITH DIFFERENTIAL/PLATELET
BASO%: 0.6 % (ref 0.0–2.0)
Basophils Absolute: 0.1 10*3/uL (ref 0.0–0.1)
EOS%: 1.2 % (ref 0.0–7.0)
Eosinophils Absolute: 0.1 10*3/uL (ref 0.0–0.5)
HCT: 41.7 % (ref 34.8–46.6)
HGB: 13.9 g/dL (ref 11.6–15.9)
LYMPH%: 26.7 % (ref 14.0–49.7)
MCH: 28.7 pg (ref 25.1–34.0)
MCHC: 33.4 g/dL (ref 31.5–36.0)
MCV: 86.2 fL (ref 79.5–101.0)
MONO#: 0.7 10*3/uL (ref 0.1–0.9)
MONO%: 7.9 % (ref 0.0–14.0)
NEUT#: 6 10*3/uL (ref 1.5–6.5)
NEUT%: 63.6 % (ref 38.4–76.8)
Platelets: 379 10*3/uL (ref 145–400)
RBC: 4.83 10*6/uL (ref 3.70–5.45)
RDW: 13.1 % (ref 11.2–14.5)
WBC: 9.4 10*3/uL (ref 3.9–10.3)
lymph#: 2.5 10*3/uL (ref 0.9–3.3)

## 2013-01-30 LAB — COMPREHENSIVE METABOLIC PANEL (CC13)
ALT: 27 U/L (ref 0–55)
AST: 28 U/L (ref 5–34)
Albumin: 4 g/dL (ref 3.5–5.0)
Alkaline Phosphatase: 68 U/L (ref 40–150)
Anion Gap: 11 mEq/L (ref 3–11)
BUN: 13.7 mg/dL (ref 7.0–26.0)
CO2: 27 mEq/L (ref 22–29)
Calcium: 10 mg/dL (ref 8.4–10.4)
Chloride: 96 mEq/L — ABNORMAL LOW (ref 98–109)
Creatinine: 0.7 mg/dL (ref 0.6–1.1)
Glucose: 162 mg/dl — ABNORMAL HIGH (ref 70–140)
Potassium: 3.2 mEq/L — ABNORMAL LOW (ref 3.5–5.1)
Sodium: 134 mEq/L — ABNORMAL LOW (ref 136–145)
Total Bilirubin: 0.96 mg/dL (ref 0.20–1.20)
Total Protein: 7.4 g/dL (ref 6.4–8.3)

## 2013-01-30 NOTE — Progress Notes (Signed)
Checked in new pt with no financial concerns. °

## 2013-01-30 NOTE — Progress Notes (Signed)
ID: Chapman Fitch Lundy OB: [birth year]  MR#: [id number]  WGN#:[id number]  PCP: Gaye Alken, MD GYN:  Carrington Clamp SU:  OTHER MD: Arta Bruce, Glenford Peers, Scarlette Calico Collichio  CHIEF COMPLAINT: "I lost my doctor."  HISTORY OF PRESENT ILLNESS: Amily'shistory of breast cancer dates back to June of 1995, when she had left modified radical mastectomy under Jerelene Redden for a pT1c pN1, stage IIA invasive ductal carcinoma, grade 2, estrogen receptor 97% positive, progesterone receptor 45% positive, treated according to CALGB 9394, sequential arm (doxorubicin x3, fourth dose apparently omitted, followed by high-dose cyclophosphamide x3), followed by tamoxifen for 5 years. There has been no evidence of disease recurrence.  Further, in October of 2007 she underwent right lumpectomy for a ductal carcinoma in situ, high-grade, estrogen and progesterone receptor negative, followed by adjuvant radiation.  In July of 2013 the patient was noted to have an irregular mole in her left upper back. It is not clear to me whether this might be related to her prior left breast irradiation. Shave biopsy of this area 09/15/2011 by Dr. Lovenia Kim [phone]) showed a superficial spreading malignant melanoma, with Breslow depth 1.45 mm, Clark's level IV. There was brisk host response and 8 mitoses per high power field were noted. There was focal regression but no definitive ulceration. There was no satellitosis. There was no vascular and urgent. Margins were positive, but cleared by wide excision under Dr Almond Lint 10/10/2011. There was some residual malignant melanoma in situ but margins to 2 cm were obtained, with advancement flap closure of the skin defect. In addition left axillary sentinel lymph node mapping was performed showing metastatic melanoma in the single lymph node removed.  Laresha sought a second opinion at [facility] under Keno Collichio. There was a full discussion regarding completion nodal  resection, use of adjuvant interferon, and BRAF testing with a view to participation in a research protocol then available. The patient considered all these options and after much discussion the decision was made not to pursue full nodal dissection, partly because the area in question had a ready undergone surgery and radiation. The patient opted against interferon adjuvant therapy because of its very marginal benefit and significant side effects. She declined consideration of a research study and BRAF testing was not performed.  Her subsequent history is as detailed below.  INTERVAL HISTORY: Harnoor had been followed by Dr. 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 She was seen 01/30/2013 accompanied by her husband Aurther Loft.  REVIEW OF SYSTEMS: Tiffay is doing fine as far as her breast and melanoma surgeries are concerned, with no unusual pain, limitations in range of motion, or lymphedema.She denies unusual headaches, visual changes, nausea, vomiting, stiff neck, dizziness, or gait imbalance. There has been no cough, phlegm production, or pleurisy, no chest pain or pressure, and no change in bowel or bladder habits. The patient denies fever, rash, bleeding, unexplained fatigue or unexplained weight loss. A detailed review of systems was otherwise entirely negative.   PAST MEDICAL HISTORY: Past Medical History  Diagnosis Date  . Cardiomyopathy secondary to chemotherapy     has improved   . Hyperlipidemia   . Hypertension     under control, has been on med. x 3 yrs.  . Breast cancer 1995 and 2007  . Melanoma in situ of back 09/2011    left  . Skin cancer 2013    Melanoma  PAST SURGICAL HISTORY: Past Surgical History  Procedure Laterality Date  . Breast lumpectomy  12/21/2005    right  . Mastectomy  1995    with breast reconstruction - left    FAMILY HISTORY Family History   Problem Relation Age of Onset  . Lung cancer Father   . Cancer Father     lung  . Autism Son    the patient's father died from complications of lung cancer at the age of 68. He was a heavy smoker. The patient's mother died at the age of 66. Cataleyah had no siblings. She underwent genetic testing for breast and ovarian cancer panel April of 2014. There were no demonstrable mutations in the BRCA or the other genes in the panel  GYNECOLOGIC HISTORY:  Menarche age 31, first live birth age 44. She is GX P2. She entered menopause at age 41, when she received her chemotherapy. She did not use hormone replacement. She did use birth control remotely, for approximately 14 years, with no complications.  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 Cheril's son Antoine Primas is handicapped, and works as an Tree surgeon. Therapist, nutritional N. Talmadge Coventry is Therapist, music limited. Daren has 3 grandchildren and [place] 2. Antonia grew up in an Barr[employer]'s [employer] denomination  ADVANCED DIRECTIVES: In place   HEALTH MAINTENANCE: History  Substance Use Topics  . Smoking status: Former Smoker    Quit date: 02/14/1964  . Smokeless tobacco: Never Used  . Alcohol Use: 0.0 oz/week     Comment: daily glass of wine     Colonoscopy: 2011  PAP:OCT 2014  Bone density: 1995?  Lipid panel:  No Known Allergies  Current Outpatient Prescriptions  Medication Sig Dispense Refill  . aspirin 325 MG tablet Take 325 mg by mouth daily. 1/2 tablet daily      . atorvastatin (LIPITOR) 40 MG tablet Take 40 mg by mouth daily.      . calcium carbonate (OS-CAL) 600 MG TABS Take 600 mg by mouth 2 (two) times daily with a meal.      . carvedilol (COREG) 12.5 MG tablet Take 1 tablet (12.5 mg total) by mouth 2 (two) times daily.  180 tablet  3  . cholecalciferol (VITAMIN D)  1000 UNITS tablet Take 1,000 Units by mouth daily.      . hydrochlorothiazide (HYDRODIURIL) 25 MG tablet Take 1 tablet (25 mg total) by mouth daily.  90 tablet  3  . ramipril (ALTACE) 10 MG tablet Take 1 tablet (10 mg total) by mouth daily.  90 tablet  3   No current facility-administered medications for this visit.    OBJECTIVE: And least white woman who appears well  Filed Vitals:   01/30/13 1618  BP: 138/76  Pulse: 82  Temp: 98 F (36.7 C)  Resp: 18     Body mass index is 23.31 kg/(m^2).    ECOG FS:0 - Asymptomatic  Ocular: Sclerae unicteric, pupils equal, round and reactive to light Ear-nose-throat: Oropharynx clear, dentition  good  Lymphatic: No cervical or supraclavicular adenopathy Lungs no rales or rhonchi, good excursion bilaterally Heart regular rate and rhythm, no murmur appreciated Abd soft, nontender, positive bowel sounds MSK no focal spinal tenderness, no joint edema Neuro: non-focal, well-oriented, appropriate affect Breasts: The right breast is status post mastectomy and reconstruction. There is no evidence of local  recurrence. The right axilla is benign. The left breast is status post lumpectomy and radiation. There is no evidence of local recurrence. The left axilla is benign Skin: I do not see any suspicious lesions in the upper body. The lower body was not examined  LAB RESULTS:  CMP     Component Value Date/Time   NA 141 01/14/2013 1217   NA 140 04/23/2012 1418   K 3.8 01/14/2013 1217   K 3.8 04/23/2012 1418   CL 99 04/23/2012 1418   CL 104 01/18/2012 0948   CO2 27 01/14/2013 1217   CO2 30 04/23/2012 1418   GLUCOSE 120 01/14/2013 1217   GLUCOSE 102* 04/23/2012 1418   GLUCOSE 124* 01/18/2012 0948   BUN 14.6 01/14/2013 1217   BUN 15 04/23/2012 1418   CREATININE 0.7 01/14/2013 1217   CREATININE 0.58 04/23/2012 1418   CALCIUM 9.7 01/14/2013 1217   CALCIUM 10.4 04/23/2012 1418   PROT 7.0 01/14/2013 1217   PROT 7.6 04/23/2012 1418   ALBUMIN 3.7 01/14/2013 1217    ALBUMIN 4.1 04/23/2012 1418   AST 23 01/14/2013 1217   AST 24 04/23/2012 1418   ALT 27 01/14/2013 1217   ALT 26 04/23/2012 1418   ALKPHOS 69 01/14/2013 1217   ALKPHOS 75 04/23/2012 1418   BILITOT 0.45 01/14/2013 1217   BILITOT 0.5 04/23/2012 1418   GFRNONAA >90 04/23/2012 1418   GFRAA >90 04/23/2012 1418    I No results found for this basename: SPEP, UPEP,  kappa and lambda light chains    Lab Results  Component Value Date   WBC 9.4 01/30/2013   NEUTROABS 6.0 01/30/2013   HGB 13.9 01/30/2013   HCT 41.7 01/30/2013   MCV 86.2 01/30/2013   PLT 379 01/30/2013      Chemistry      Component Value Date/Time   NA 141 01/14/2013 1217   NA 140 04/23/2012 1418   K 3.8 01/14/2013 1217   K 3.8 04/23/2012 1418   CL 99 04/23/2012 1418   CL 104 01/18/2012 0948   CO2 27 01/14/2013 1217   CO2 30 04/23/2012 1418   BUN 14.6 01/14/2013 1217   BUN 15 04/23/2012 1418   CREATININE 0.7 01/14/2013 1217   CREATININE 0.58 04/23/2012 1418      Component Value Date/Time   CALCIUM 9.7 01/14/2013 1217   CALCIUM 10.4 04/23/2012 1418   ALKPHOS 69 01/14/2013 1217   ALKPHOS 75 04/23/2012 1418   AST 23 01/14/2013 1217   AST 24 04/23/2012 1418   ALT 27 01/14/2013 1217   ALT 26 04/23/2012 1418   BILITOT 0.45 01/14/2013 1217   BILITOT 0.5 04/23/2012 1418       No results found for this basename: LABCA2    No components found with this basename: LABCA125    No results found for this basename: INR,  in the last 168 hours  Urinalysis No results found for this basename: colorurine, appearanceur, labspec, phurine, glucoseu, hgbur, bilirubinur, ketonesur, proteinur, urobilinogen, nitrite, leukocytesur    STUDIES: Nm Pet Image Restage (ps) Whole Body  01/29/2013   CLINICAL DATA:  Subsequent treatment strategy for melanoma. Restaging scan. Patient also has a history of breast cancer.  EXAM: NUCLEAR MEDICINE PET SKULL BASE TO THIGH  FASTING BLOOD GLUCOSE:  Value: 145mg /dl  TECHNIQUE: 16.1 mCi W-96 FDG was injected  intravenously. CT data was obtained and used for attenuation correction and anatomic localization only. (This was not acquired as a diagnostic CT examination.) Additional exam technical data entered  on technologist worksheet.  COMPARISON:  PET-CT 10/25/2011.  FINDINGS: NECK  No hypermetabolic lymph nodes in the neck.  CHEST  No hypermetabolic mediastinal or hilar nodes. No suspicious pulmonary nodules on the CT scan. Postoperative changes of left-sided modified radical mastectomy and axillary nodal dissection. Status post left-sided TRAM flap.  ABDOMEN/PELVIS  No abnormal hypermetabolic activity within the liver, pancreas, adrenal glands, or spleen. No hypermetabolic lymph nodes in the abdomen or pelvis. Normal appendix. Numerous colonic diverticulae are noted, without surrounding inflammatory changes to suggest an acute diverticulitis at this time. Postoperative changes in the anterior abdominal wall related to TRAM flap procedure.  SKELETON  No focal hypermetabolic activity to suggest skeletal metastasis.  IMPRESSION: 1. No suspicious areas of hypermetabolism on today's examination to suggest recurrence of either malignant melanoma or breast cancer in this patient. 2. Colonic diverticulosis.   Electronically Signed   By: Trudie Reed M.D.   On: 01/29/2013 08:56    ASSESSMENT: 67 y.o. BRCA negative [place] woman   (1) status post left mastectomy with TRAM reconstruction June of 1995 for a T1c N1, stage IIA invasive ductal carcinoma, grade 2, estrogen receptor 97% positive, progesterone receptor 45% positive, treated adjuvantly according to CALGB 9394 with 3 cycles of doxorubicin followed by 3 cycles of cyclophosphamide, then tamoxifen for 5 years, off therapy completed November of 2000  (2) status post right lumpectomy October 2007 for high-grade ductal carcinoma in situ, with negative margins estrogen and progesterone receptor negative, followed by adjuvant radiation therapy   (3) malignant  melanoma, T2a N1a = stage IIIA, as follows:  (a) status post shave biopsy from the right upper back 09/15/2011 for a superficial spreading melanoma, Breslow depth 1.45 mm, Clark's level IV, with focal regression but no ulceration.  (b) status post wide excision and sentinel lymph node sampling 10/10/2011 with residual malignant melanoma in situ, but negative margins; the single sentinel lymph node was involved by melanoma with the largest subcapsular deposit measuring 0.22 mm, no evidence of capsular involvement or extracapsular extension  (c) completion nodal dissection was discussed, but not performed in part because the patient had already had a complete left axillary lymph node dissection at the time of her 1995 left mastectomy (10 lymph nodes were removed at that time)  (d) adjuvant interferon was discussed with the patient when she visited [facility] in September 2013, but given that it side effects and marginal benefits the patient declined  (e) BRAF testing has not been done on the original tumor; this was also discussed with the patient at the time of her [facility] visit, as it might possibly lead to participation in a research protocol; the patient decided not to pursue that option   PLAN: I spent the better part of today's hour-long visit going over Tacori's history and reviewing in particular her melanoma management. I am comfortable with all the decisions that were made and indeed currently there is a study randomly assigning patient's to completion lymph node dissection versus observation, which indicates her decision to forego further surgery (even a side from the issue of the prior x-ray and lymph node sampling) is not unreasonable.  Accordingly we are continuing followup as before, and she will see [place] again in April, then in October of 2015. She will have a repeat PET scan before the October visit. Of course she will continue to see her dermatologist Dr Lovenia Kim regularly as before.  As far as the  breast cancers are concerned, the invasive tumor was removed nearly 20 years  ago and the noninvasive tumor 7 years ago. All she needs is yearly screening mammography and yearly physician breast exam, which we of course will be glad to perform.  Sidonia has a good understanding of the overall plan, and agrees with it. She knows the goal of treatment is cure. She will call for any problems that may develop before her next visit here.  Lowella Dell, MD   01/30/2013 4:28 PM

## 2013-01-31 ENCOUNTER — Encounter: Payer: Self-pay | Admitting: *Deleted

## 2013-01-31 NOTE — Progress Notes (Signed)
Mailed after appt letter to pt. 

## 2013-02-03 ENCOUNTER — Telehealth: Payer: Self-pay | Admitting: Oncology

## 2013-02-03 NOTE — Telephone Encounter (Signed)
, °

## 2013-03-20 ENCOUNTER — Other Ambulatory Visit: Payer: Self-pay

## 2013-04-09 ENCOUNTER — Telehealth: Payer: Self-pay | Admitting: *Deleted

## 2013-04-09 NOTE — Telephone Encounter (Signed)
Patient in office to request refill on Atorvastatin for cholesterol. Patient states Dr Tressie Stalker used to fill this for her at [facility]. However, patient is now under care with Dr Jana Hakim. Called to primary MD office, Dr Leighton Ruff, spoke with her nurse [name], she states last lipids were checked 10/03/11. She will be happy to call patient and take care of this as patients PCP.  She will call patient.

## 2013-05-22 ENCOUNTER — Ambulatory Visit: Payer: Medicare Other

## 2013-05-27 ENCOUNTER — Other Ambulatory Visit (HOSPITAL_BASED_OUTPATIENT_CLINIC_OR_DEPARTMENT_OTHER): Payer: Medicare Other

## 2013-05-27 DIAGNOSIS — C4359 Malignant melanoma of other part of trunk: Secondary | ICD-10-CM

## 2013-05-27 DIAGNOSIS — C50912 Malignant neoplasm of unspecified site of left female breast: Secondary | ICD-10-CM

## 2013-05-27 DIAGNOSIS — C50911 Malignant neoplasm of unspecified site of right female breast: Secondary | ICD-10-CM

## 2013-05-27 LAB — CBC WITH DIFFERENTIAL/PLATELET
BASO%: 1 % (ref 0.0–2.0)
Basophils Absolute: 0.1 10*3/uL (ref 0.0–0.1)
EOS%: 6.4 % (ref 0.0–7.0)
Eosinophils Absolute: 0.4 10*3/uL (ref 0.0–0.5)
HCT: 37.3 % (ref 34.8–46.6)
HGB: 12.6 g/dL (ref 11.6–15.9)
LYMPH%: 42.6 % (ref 14.0–49.7)
MCH: 29 pg (ref 25.1–34.0)
MCHC: 33.8 g/dL (ref 31.5–36.0)
MCV: 85.9 fL (ref 79.5–101.0)
MONO#: 0.5 10*3/uL (ref 0.1–0.9)
MONO%: 8.3 % (ref 0.0–14.0)
NEUT#: 2.4 10*3/uL (ref 1.5–6.5)
NEUT%: 41.7 % (ref 38.4–76.8)
Platelets: 332 10*3/uL (ref 145–400)
RBC: 4.34 10*6/uL (ref 3.70–5.45)
RDW: 13 % (ref 11.2–14.5)
WBC: 5.8 10*3/uL (ref 3.9–10.3)
lymph#: 2.5 10*3/uL (ref 0.9–3.3)

## 2013-05-27 LAB — COMPREHENSIVE METABOLIC PANEL (CC13)
ALT: 27 U/L (ref 0–55)
AST: 24 U/L (ref 5–34)
Albumin: 3.8 g/dL (ref 3.5–5.0)
Alkaline Phosphatase: 69 U/L (ref 40–150)
Anion Gap: 8 mEq/L (ref 3–11)
BUN: 13.4 mg/dL (ref 7.0–26.0)
CO2: 30 mEq/L — ABNORMAL HIGH (ref 22–29)
Calcium: 9.9 mg/dL (ref 8.4–10.4)
Chloride: 102 mEq/L (ref 98–109)
Creatinine: 0.7 mg/dL (ref 0.6–1.1)
Glucose: 115 mg/dl (ref 70–140)
Potassium: 4 mEq/L (ref 3.5–5.1)
Sodium: 140 mEq/L (ref 136–145)
Total Bilirubin: 0.35 mg/dL (ref 0.20–1.20)
Total Protein: 7 g/dL (ref 6.4–8.3)

## 2013-05-29 ENCOUNTER — Ambulatory Visit: Payer: Medicare Other

## 2013-06-03 ENCOUNTER — Encounter: Payer: Self-pay | Admitting: Physician Assistant

## 2013-06-03 ENCOUNTER — Ambulatory Visit (HOSPITAL_BASED_OUTPATIENT_CLINIC_OR_DEPARTMENT_OTHER): Payer: Medicare Other | Admitting: Physician Assistant

## 2013-06-03 VITALS — BP 148/74 | HR 69 | Temp 98.2°F | Resp 18 | Ht 62.0 in | Wt 125.7 lb

## 2013-06-03 DIAGNOSIS — C50912 Malignant neoplasm of unspecified site of left female breast: Secondary | ICD-10-CM

## 2013-06-03 DIAGNOSIS — C50911 Malignant neoplasm of unspecified site of right female breast: Secondary | ICD-10-CM

## 2013-06-03 DIAGNOSIS — Z853 Personal history of malignant neoplasm of breast: Secondary | ICD-10-CM

## 2013-06-03 DIAGNOSIS — C4359 Malignant melanoma of other part of trunk: Secondary | ICD-10-CM

## 2013-06-03 NOTE — Progress Notes (Signed)
ID: Tanya Mitchell OB: Jun 30, 1945  MR#: [id number]  RVI#:[id number]  PCP: Gerrit Heck, MD GYN:  Bobbye Charleston, MD SU:  OTHER MD: Sydnee Levans, MD (FAX # [phone]); Everardo All, MD;  Berle Mull, MD ([address], [address], [place], [place] [address])   CHIEF COMPLAINTS:  1)  Hx Left Breast Cancer 1995      2) Hx Right Breast Cancer 2007                 3)  Hx Melanoma, Stage IIIA   HISTORY OF PRESENT ILLNESS: Tanya Mitchell's history of breast cancer dates back to June of 1995, when she had left modified radical mastectomy under [facility] for a pT1c pN1, stage IIA invasive ductal carcinoma, grade 2, estrogen receptor 97% positive, progesterone receptor 45% positive, treated according to CALGB 9394, sequential arm (doxorubicin x3, fourth dose apparently omitted, followed by high-dose cyclophosphamide x3), followed by tamoxifen for 5 years. There has been no evidence of disease recurrence.  Further, in October of 2007 she underwent right lumpectomy for a ductal carcinoma in situ, high-grade, estrogen and progesterone receptor negative, followed by adjuvant radiation.  In July of 2013 the patient was noted to have an irregular mole in her left upper back. It is not clear to me whether this might be related to her prior left breast irradiation. Shave biopsy of this area 09/15/2011 by Dr. Derrel Nip [id number]) showed a superficial spreading malignant melanoma, with Breslow depth 1.45 mm, Clark's level IV. There was brisk host response and 8 mitoses per high power field were noted. There was focal regression but no definitive ulceration. There was no satellitosis. There was no vascular and urgent. Margins were positive, but cleared by wide excision under Dr Stark Klein 10/10/2011. There was some residual malignant melanoma in situ but margins to 2 cm were obtained, with advancement flap closure of the skin defect. In addition left axillary sentinel lymph node mapping was  performed showing metastatic melanoma in the single lymph node removed.  Tanya Mitchell sought a second opinion at [facility] under Dr Joaquim Lai Collichio. There was a full discussion regarding completion nodal resection, use of adjuvant interferon, and BRAF testing with a view to participation in a research protocol then available. The patient considered all these options and after much discussion the decision was made not to pursue full nodal dissection, partly because the area in question had a ready undergone surgery and radiation. The patient opted against interferon adjuvant therapy because of its very marginal benefit and significant side effects. She declined consideration of a research study and BRAF testing was not performed.  Her subsequent history is as detailed below.  INTERVAL HISTORY: Tanya Mitchell returns alone today for follow up of both her bilateral breast cancers and her melanoma.  Interval history is generally unremarkable since her visit here in December. She's had no new health complaints or concerns, and is feeling well.  She sees her dermatologist on a regular basis for followup as well.   REVIEW OF SYSTEMS: Tanya Mitchell denies any recent illnesses and has had no fevers, chills, or night sweats. She's had no unexplained fatigue or weight loss.  She denies any skin changes, rashes, or new skin lesions. She also denies any abnormal bruising or bleeding. She's had no recent nausea or emesis, reflux, or change in bowel or bladder habits. She's had no cough, phlegm production, shortness of breath, chest pain, or palpitations. No abnormal headaches, dizziness, or change in vision. Currently, she also denies any unusual myalgias, arthralgias, bony  pain, or peripheral swelling.  A detailed review of systems is otherwise stable and noncontributory.   PAST MEDICAL HISTORY: Past Medical History  Diagnosis Date  . Cardiomyopathy secondary to chemotherapy     has improved   . Hyperlipidemia   . Hypertension     under  control, has been on med. x 3 yrs.  . Breast cancer 1995 and 2007  . Melanoma in situ of back 09/2011    left  . Skin cancer 2013    Melanoma    PAST SURGICAL HISTORY: Past Surgical History  Procedure Laterality Date  . Breast lumpectomy  12/21/2005    right  . Mastectomy  1995    with breast reconstruction - left    FAMILY HISTORY Family History  Problem Relation Age of Onset  . Lung cancer Father   . Cancer Father     lung  . Autism Son    the patient's father died from complications of lung cancer at the age of 68. He was a heavy smoker. The patient's mother died at the age of 35. Tanya Mitchell had no siblings. She underwent genetic testing for breast and ovarian cancer panel April of 2014. There were no demonstrable mutations in the BRCA or the other genes in the panel  GYNECOLOGIC HISTORY:  Menarche age 57, first live birth age 57. She is GX P2. She entered menopause at age 63, when she received her chemotherapy. She did not use hormone replacement. She did use birth control remotely, for approximately 14 years, with no complications.  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 Gustavia's son 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 Tanya Mitchell has 3 grandchildren and Tanya Mitchell 2. 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 Smoking status: Former Smoker    Quit date: 02/14/1964  . Smokeless tobacco: Never Used  . Alcohol Use: 0.0 oz/week     Comment: daily glass of wine     Colonoscopy: 2011  PAP: November 2014  Bone density: January 2013, osteopenia  Lipid panel:    August 2013/Dr. Barnes   No Known Allergies  Current Outpatient Prescriptions  Medication Sig Dispense Refill  . aspirin 325 MG tablet Take 325 mg by mouth daily. 1/2 tablet daily      . atorvastatin (LIPITOR) 40 MG tablet Take 40 mg by mouth daily.      . calcium carbonate (OS-CAL) 600 MG TABS Take 600 mg by mouth 2 (two) times daily with a meal.      . carvedilol (COREG) 12.5 MG tablet Take 1 tablet (12.5 mg total) by mouth 2 (two) times daily.  180 tablet  3  . cholecalciferol (VITAMIN D) 1000 UNITS tablet Take 1,000 Units by mouth daily.      . hydrochlorothiazide (HYDRODIURIL) 25 MG tablet Take 1 tablet (25 mg total) by mouth daily.  90 tablet  3  . ramipril (ALTACE) 10 MG tablet Take 1 tablet (10 mg total) by mouth daily.  90 tablet  3   No current facility-administered medications for this visit.    OBJECTIVE: And least white Mitchell who appears well and is  in no acute distress Filed Vitals:   06/03/13 1130  BP: 148/74  Pulse: 69  Temp: 98.2 F (36.8 C)  Resp: 18     Body mass index is 22.98 kg/(m^2).    ECOG FS:0 - Asymptomatic Filed Weights   06/03/13 1130  Weight: 125 lb 11.2 oz (57.017 kg)   Physical Exam: HEENT:  Sclerae anicteric.  Oropharynx clear, pink, and moist.  no oral ulcerations or lesions noted. Neck is supple, trachea midline.  NODES:  No cervical or supraclavicular lymphadenopathy palpated.  BREAST EXAM:  Patient is status post right mastectomy with reconstruction. No nodularity or skin changes, and no evidence of local recurrence. Left breast is status post lumpectomy and radiation, with no nodularities or skin changes, and no evidence of local recurrence. Axillae are benign bilaterally, with no palpable lymphadenopathy.  LUNGS:  Clear to auscultation bilaterallyWith good excursion.  No wheezes or rhonchi HEART:  Regular rate and rhythm. No murmur  ABDOMEN:  Soft, nontender.  no organomegaly or masses palpated  Positive bowel sounds.  MSK:  No focal spinal  tenderness to palpation.  good range of motion bilaterally in the upper extremities.  EXTREMITIES:  No peripheral edema.   no lymphedema noted in either the left or right upper extremity  SKIN:  Benign with no visible rashes. No suspicious lesions on the upper portion of the body. The lower portion of the body was not examined today. No excessive ecchymoses. No petechiae. No pallor.  NEURO:  Nonfocal. Well oriented.  Positive  affect.    LAB RESULTS:   Lab Results  Component Value Date   WBC 5.8 05/27/2013   NEUTROABS 2.4 05/27/2013   HGB 12.6 05/27/2013   HCT 37.3 05/27/2013   MCV 85.9 05/27/2013   PLT 332 05/27/2013      Chemistry      Component Value Date/Time   NA 140 05/27/2013 1208   NA 140 04/23/2012 1418   K 4.0 05/27/2013 1208   K 3.8 04/23/2012 1418   CL 99 04/23/2012 1418   CL 104 01/18/2012 0948   CO2 30* 05/27/2013 1208   CO2 30 04/23/2012 1418   BUN 13.4 05/27/2013 1208   BUN 15 04/23/2012 1418   CREATININE 0.7 05/27/2013 1208   CREATININE 0.58 04/23/2012 1418      Component Value Date/Time   CALCIUM 9.9 05/27/2013 1208   CALCIUM 10.4 04/23/2012 1418   ALKPHOS 69 05/27/2013 1208   ALKPHOS 75 04/23/2012 1418   AST 24 05/27/2013 1208   AST 24 04/23/2012 1418   ALT 27 05/27/2013 1208   ALT 26 04/23/2012 1418   BILITOT 0.35 05/27/2013 1208   BILITOT 0.5 04/23/2012 1418      STUDIES:  Most recent right diagnostic mammogram in July 2014 was unremarkable.   Nm Pet Image Restage (ps) Whole Body 01/29/2013   CLINICAL DATA:  Subsequent treatment strategy for melanoma. Restaging scan. Patient also has a history of breast cancer.  EXAM: NUCLEAR MEDICINE PET SKULL BASE TO THIGH  FASTING BLOOD GLUCOSE:  Value: 160m/dl  TECHNIQUE: 18.1 mCi F-18 FDG was injected intravenously. CT data was obtained and used for attenuation correction and anatomic localization only. (This was not acquired as a diagnostic CT examination.) Additional exam technical data entered on technologist worksheet.   COMPARISON:  PET-CT 10/25/2011.  FINDINGS: NECK  No hypermetabolic lymph nodes in the neck.  CHEST  No hypermetabolic mediastinal or hilar nodes. No suspicious pulmonary nodules on the CT scan. Postoperative changes of left-sided  modified radical mastectomy and axillary nodal dissection. Status post left-sided TRAM flap.  ABDOMEN/PELVIS  No abnormal hypermetabolic activity within the liver, pancreas, adrenal glands, or spleen. No hypermetabolic lymph nodes in the abdomen or pelvis. Normal appendix. Numerous colonic diverticulae are noted, without surrounding inflammatory changes to suggest an acute diverticulitis at this time. Postoperative changes in the anterior abdominal wall related to TRAM flap procedure.  SKELETON  No focal hypermetabolic activity to suggest skeletal metastasis.  IMPRESSION: 1. No suspicious areas of hypermetabolism on today's examination to suggest recurrence of either malignant melanoma or breast cancer in this patient. 2. Colonic diverticulosis.   Electronically Signed   By: Vinnie Langton M.D.   On: 01/29/2013 08:56    ASSESSMENT: 68 y.o. BRCA negative Tanya Mitchell   (1) status post left mastectomy with TRAM reconstruction June of 1995 for a T1c N1, stage IIA invasive ductal carcinoma, grade 2, estrogen receptor 97% positive, progesterone receptor 45% positive, treated adjuvantly according to CALGB 9394 with 3 cycles of doxorubicin followed by 3 cycles of cyclophosphamide, then tamoxifen for 5 years, off therapy completed November of 2000  (2) status post right lumpectomy October 2007 for high-grade ductal carcinoma in situ, with negative margins estrogen and progesterone receptor negative, followed by adjuvant radiation therapy   (3) malignant melanoma, T2a N1a = stage IIIA, as follows:  (a) status post shave biopsy from the right upper back 09/15/2011 for a superficial spreading melanoma, Breslow depth 1.45 mm, Clark's level IV, with focal regression but no  ulceration.  (b) status post wide excision and sentinel lymph node sampling 10/10/2011 with residual malignant melanoma in situ, but negative margins; the single sentinel lymph node was involved by melanoma with the largest subcapsular deposit measuring 0.22 mm, no evidence of capsular involvement or extracapsular extension  (c) completion nodal dissection was discussed, but not performed in part because the patient had already had a complete left axillary lymph node dissection at the time of her 1995 left mastectomy (10 lymph nodes were removed at that time)  (d) adjuvant interferon was discussed with the patient when she visited [facility] in September 2013, but given that it side effects and marginal benefits the patient declined  (e) BRAF testing has not been done on the original tumor; this was also discussed with the patient at the time of her [facility] visit, as it might possibly lead to participation in a research protocol; the patient decided not to pursue that option   PLAN: Latifa appears to be doing very well with regards to both her breast cancer and melanoma history is. There is no clinical evidence of disease recurrence at this time. She will continue close followup through our office, and will continue to see her dermatologist, Dr. Derrel Nip, on a regular basis. She also plans to followup with Dr.  Harriet Masson at [facility] as well with regards to her melanoma which I encouraged.   As far as her followup through our office, she will have her right mammogram in July. She will then have a repeat PET scan in early October, after which she will return here for labs and physical exam.  All the above was reviewed with Tanya Mitchell. She voices her understanding and agreement with the above plan, and knows to call us at anytime should she have any changes or problems.    Theotis Burrow, PA-C   06/03/2013 1:07 PM

## 2013-06-05 ENCOUNTER — Ambulatory Visit: Payer: Medicare Other

## 2013-06-24 ENCOUNTER — Telehealth: Payer: Self-pay | Admitting: Oncology

## 2013-06-24 NOTE — Telephone Encounter (Signed)
, °

## 2013-07-02 ENCOUNTER — Other Ambulatory Visit: Payer: Self-pay

## 2013-07-02 MED ORDER — CARVEDILOL 12.5 MG PO TABS: 12.5000 mg | ORAL_TABLET | Freq: Two times a day (BID) | ORAL | Status: DC

## 2013-07-03 ENCOUNTER — Other Ambulatory Visit: Payer: Self-pay

## 2013-07-03 MED ORDER — HYDROCHLOROTHIAZIDE 25 MG PO TABS: 25.0000 mg | ORAL_TABLET | Freq: Every day | ORAL | Status: DC

## 2013-07-23 ENCOUNTER — Other Ambulatory Visit: Payer: Self-pay

## 2013-07-23 MED ORDER — RAMIPRIL 10 MG PO CAPS: 10.0000 mg | ORAL_CAPSULE | Freq: Every day | ORAL | Status: DC

## 2013-08-19 ENCOUNTER — Ambulatory Visit
Admission: RE | Admit: 2013-08-19 | Discharge: 2013-08-19 | Disposition: A | Discharge status: Home / Self Care | Payer: Medicare Other | Source: Ambulatory Visit | Attending: Physician Assistant | Admitting: Physician Assistant

## 2013-08-19 ENCOUNTER — Other Ambulatory Visit: Payer: Self-pay | Admitting: Physician Assistant

## 2013-08-19 DIAGNOSIS — Z853 Personal history of malignant neoplasm of breast: Secondary | ICD-10-CM

## 2013-08-27 ENCOUNTER — Other Ambulatory Visit: Payer: Self-pay

## 2013-08-27 MED ORDER — HYDROCHLOROTHIAZIDE 25 MG PO TABS: 25.0000 mg | ORAL_TABLET | Freq: Every day | ORAL | Status: DC

## 2013-09-08 ENCOUNTER — Encounter: Payer: Self-pay | Admitting: Cardiology

## 2013-09-11 ENCOUNTER — Other Ambulatory Visit: Payer: Self-pay

## 2013-09-11 MED ORDER — HYDROCHLOROTHIAZIDE 25 MG PO TABS: 25.0000 mg | ORAL_TABLET | Freq: Every day | ORAL | Status: DC

## 2013-09-17 ENCOUNTER — Telehealth: Payer: Self-pay | Admitting: Cardiology

## 2013-09-17 NOTE — Telephone Encounter (Signed)
I told her to come August 24 at 11:30  Can you put in?

## 2013-09-17 NOTE — Telephone Encounter (Signed)
New message    Patient calling stating she has not been in the office awhile to see Dr. Aundra Dubin .  Patient stated she seen Cecille Rubin since then.  Would like a message sent around for Tera Helper to call her .    I've inform patient of the dates that listed in epic. 08/26/2010 with Tera Helper 11/30/2011 with Tera Helper 05/10/2012 with Dr. Aundra Dubin 06/25/2012 for echo

## 2013-10-06 ENCOUNTER — Ambulatory Visit (INDEPENDENT_AMBULATORY_CARE_PROVIDER_SITE_OTHER): Payer: Medicare Other | Admitting: Nurse Practitioner

## 2013-10-06 ENCOUNTER — Encounter: Payer: Self-pay | Admitting: Nurse Practitioner

## 2013-10-06 VITALS — BP 120/72 | HR 72 | Ht 62.0 in | Wt 128.4 lb

## 2013-10-06 DIAGNOSIS — I10 Essential (primary) hypertension: Secondary | ICD-10-CM

## 2013-10-06 DIAGNOSIS — I428 Other cardiomyopathies: Secondary | ICD-10-CM

## 2013-10-06 MED ORDER — HYDROCHLOROTHIAZIDE 25 MG PO TABS: 25.0000 mg | ORAL_TABLET | Freq: Every day | ORAL | Status: DC

## 2013-10-06 NOTE — Progress Notes (Signed)
Tanya Mitchell Date of Birth: [birth year] Medical Record #[id number]  History of Present Illness: Tanya Mitchell is seen back today for a follow up visit. She is seen for Dr. Aundra Dubin. She is a former patient of Dr. Susa Simmonds. She has had a prior cardiomyopathy secondary to chemotherapy for breast cancer in the remote past. Her EF has recovered. Last echo was in July of 2011. Other issues include melanoma - now seeing Dr. Jana Hakim and Dr. Harriet Masson at [facility] for her melanoma.    She comes in today. She is here alone. Did not get her recall again and was not scheduled with Dr. Aundra Dubin for this year. I have not seen her since 2013. Saw Dr. Aundra Dubin in March of 2014. She is doing well. No chest pain. Not short of breath. Feels good. Labs checked by PCP.  Current Outpatient Prescriptions  Medication Sig Dispense Refill  . aspirin 325 MG tablet Take 325 mg by mouth daily. 1/2 tablet daily      . atorvastatin (LIPITOR) 40 MG tablet Take 40 mg by mouth daily.      . calcium carbonate (OS-CAL) 600 MG TABS Take 600 mg by mouth 2 (two) times daily with a meal.      . carvedilol (COREG) 12.5 MG tablet Take 1 tablet (12.5 mg total) by mouth 2 (two) times daily.  180 tablet  3  . cholecalciferol (VITAMIN D) 1000 UNITS tablet Take 1,000 Units by mouth daily.      . hydrochlorothiazide (HYDRODIURIL) 25 MG tablet Take 1 tablet (25 mg total) by mouth daily.  15 tablet  0  . ramipril (ALTACE) 10 MG capsule Take 1 capsule (10 mg total) by mouth daily.  90 capsule  3   No current facility-administered medications for this visit.    No Known Allergies  Past Medical History  Diagnosis Date  . Cardiomyopathy secondary to chemotherapy     has improved   . Hyperlipidemia   . Hypertension     under control, has been on med. x 3 yrs.  . Breast cancer 1995 and 2007  . Melanoma in situ of back 09/2011    left  . Skin cancer 2013    Melanoma    Past Surgical History  Procedure Laterality Date  . Breast lumpectomy  12/21/2005     right  . Mastectomy  1995    with breast reconstruction - left    History  Smoking status  . Former Smoker  . Quit date: 02/14/1964  Smokeless tobacco  . Never Used    History  Alcohol Use  . 0.0 oz/week    Comment: daily glass of wine    Family History  Problem Relation Age of Onset  . Lung cancer Father   . Cancer Father     lung  . Autism Son     Review of Systems: The review of systems is per the HPI.  All other systems were reviewed and are negative.  Physical Exam: BP 120/72  Pulse 72  Ht 5\' 2"  (1.575 m)  Wt 128 lb 6.4 oz (58.242 kg)  BMI 23.48 kg/m2  SpO2 97% Patient is very pleasant and in no acute distress. Skin is warm and dry. Color is normal.  HEENT is unremarkable. Normocephalic/atraumatic. PERRL. Sclera are nonicteric. Neck is supple. No masses. No JVD. Lungs are clear. Cardiac exam shows a regular rate and rhythm. Abdomen is soft. Extremities are without edema. Gait and ROM are intact. No gross neurologic deficits noted.  Wt  Readings from Last 3 Encounters:  10/06/13 128 lb 6.4 oz (58.242 kg)  06/03/13 125 lb 11.2 oz (57.017 kg)  01/30/13 127 lb 8 oz (57.834 kg)    LABORATORY DATA/PROCEDURES:  Lab Results  Component Value Date   WBC 5.8 05/27/2013   HGB 12.6 05/27/2013   HCT 37.3 05/27/2013   PLT 332 05/27/2013   GLUCOSE 115 05/27/2013   ALT 27 05/27/2013   AST 24 05/27/2013   NA 140 05/27/2013   K 4.0 05/27/2013   CL 99 04/23/2012   CREATININE 0.7 05/27/2013   BUN 13.4 05/27/2013   CO2 30* 05/27/2013    BNP (last 3 results) No results found for this basename: PROBNP,  in the last 8760 hours   Assessment / Plan:  1. History of CM - last echo was in 2014 -  EF normal. She is doing great clinically. No change in her current regimen. I will see her back in a year.  2. Melanoma - followed by oncology at [facility]  3. HLD - now on Lipitor. She has her labs with her PCP  Patient is agreeable to this plan and will call if any problems develop in  the interim.   Burtis Junes, RN, [facility] [address] [place], [place]  [address] [phone]

## 2013-10-06 NOTE — Patient Instructions (Signed)
I think you are doing well.   I will see you in a year  Call the [place] office at [phone] if you have any questions, problems or concerns.

## 2013-11-13 ENCOUNTER — Encounter (HOSPITAL_COMMUNITY): Admission: RE | Admit: 2013-11-13 | Payer: Medicare Other | Source: Ambulatory Visit

## 2013-11-18 ENCOUNTER — Encounter (HOSPITAL_COMMUNITY)
Admission: RE | Admit: 2013-11-18 | Discharge: 2013-11-18 | Disposition: A | Discharge status: Home / Self Care | Payer: Medicare Other | Source: Ambulatory Visit | Attending: Diagnostic Radiology | Admitting: Diagnostic Radiology

## 2013-11-18 ENCOUNTER — Encounter (HOSPITAL_COMMUNITY): Payer: Self-pay

## 2013-11-18 DIAGNOSIS — C50912 Malignant neoplasm of unspecified site of left female breast: Secondary | ICD-10-CM | POA: Diagnosis not present

## 2013-11-18 DIAGNOSIS — J984 Other disorders of lung: Secondary | ICD-10-CM | POA: Insufficient documentation

## 2013-11-18 DIAGNOSIS — C50911 Malignant neoplasm of unspecified site of right female breast: Secondary | ICD-10-CM | POA: Diagnosis not present

## 2013-11-18 DIAGNOSIS — C4359 Malignant melanoma of other part of trunk: Secondary | ICD-10-CM | POA: Diagnosis present

## 2013-11-18 LAB — GLUCOSE, CAPILLARY: Glucose-Capillary: 143 mg/dL — ABNORMAL HIGH (ref 70–99)

## 2013-11-18 MED ORDER — FLUDEOXYGLUCOSE F - 18 (FDG) INJECTION
6.4000 | Freq: Once | INTRAVENOUS | Status: AC | PRN
  Administered 2013-11-18: 6.4 via INTRAVENOUS

## 2013-11-21 ENCOUNTER — Other Ambulatory Visit (HOSPITAL_BASED_OUTPATIENT_CLINIC_OR_DEPARTMENT_OTHER): Payer: Medicare Other

## 2013-11-21 ENCOUNTER — Other Ambulatory Visit: Payer: Self-pay | Admitting: *Deleted

## 2013-11-21 DIAGNOSIS — C50911 Malignant neoplasm of unspecified site of right female breast: Secondary | ICD-10-CM

## 2013-11-21 DIAGNOSIS — C50912 Malignant neoplasm of unspecified site of left female breast: Principal | ICD-10-CM

## 2013-11-21 DIAGNOSIS — C4359 Malignant melanoma of other part of trunk: Secondary | ICD-10-CM

## 2013-11-21 LAB — COMPREHENSIVE METABOLIC PANEL (CC13)
ALT: 30 U/L (ref 0–55)
AST: 22 U/L (ref 5–34)
Albumin: 3.6 g/dL (ref 3.5–5.0)
Alkaline Phosphatase: 63 U/L (ref 40–150)
Anion Gap: 7 mEq/L (ref 3–11)
BUN: 17.2 mg/dL (ref 7.0–26.0)
CO2: 31 mEq/L — ABNORMAL HIGH (ref 22–29)
Calcium: 9.9 mg/dL (ref 8.4–10.4)
Chloride: 102 mEq/L (ref 98–109)
Creatinine: 0.7 mg/dL (ref 0.6–1.1)
Glucose: 153 mg/dl — ABNORMAL HIGH (ref 70–140)
Potassium: 4.9 mEq/L (ref 3.5–5.1)
Sodium: 140 mEq/L (ref 136–145)
Total Bilirubin: 0.35 mg/dL (ref 0.20–1.20)
Total Protein: 7.3 g/dL (ref 6.4–8.3)

## 2013-11-21 LAB — CBC WITH DIFFERENTIAL/PLATELET
BASO%: 1.2 % (ref 0.0–2.0)
Basophils Absolute: 0.1 10*3/uL (ref 0.0–0.1)
EOS%: 3.7 % (ref 0.0–7.0)
Eosinophils Absolute: 0.2 10*3/uL (ref 0.0–0.5)
HCT: 41.3 % (ref 34.8–46.6)
HGB: 13.5 g/dL (ref 11.6–15.9)
LYMPH%: 36 % (ref 14.0–49.7)
MCH: 28.5 pg (ref 25.1–34.0)
MCHC: 32.7 g/dL (ref 31.5–36.0)
MCV: 87.2 fL (ref 79.5–101.0)
MONO#: 0.5 10*3/uL (ref 0.1–0.9)
MONO%: 8.4 % (ref 0.0–14.0)
NEUT#: 2.9 10*3/uL (ref 1.5–6.5)
NEUT%: 50.7 % (ref 38.4–76.8)
Platelets: 379 10*3/uL (ref 145–400)
RBC: 4.73 10*6/uL (ref 3.70–5.45)
RDW: 13.2 % (ref 11.2–14.5)
WBC: 5.7 10*3/uL (ref 3.9–10.3)
lymph#: 2.1 10*3/uL (ref 0.9–3.3)

## 2013-12-02 ENCOUNTER — Other Ambulatory Visit: Payer: Medicare Other

## 2013-12-09 ENCOUNTER — Ambulatory Visit (HOSPITAL_COMMUNITY)
Admission: RE | Admit: 2013-12-09 | Discharge: 2013-12-09 | Disposition: A | Discharge status: Home / Self Care | Payer: Medicare Other | Source: Ambulatory Visit | Attending: Oncology | Admitting: Oncology

## 2013-12-09 ENCOUNTER — Ambulatory Visit (HOSPITAL_BASED_OUTPATIENT_CLINIC_OR_DEPARTMENT_OTHER): Payer: Medicare Other | Admitting: Oncology

## 2013-12-09 ENCOUNTER — Other Ambulatory Visit: Payer: Self-pay | Admitting: Oncology

## 2013-12-09 ENCOUNTER — Telehealth: Payer: Self-pay | Admitting: Oncology

## 2013-12-09 VITALS — BP 130/62 | HR 78 | Temp 98.5°F | Resp 18 | Ht 62.0 in | Wt 132.3 lb

## 2013-12-09 DIAGNOSIS — C50912 Malignant neoplasm of unspecified site of left female breast: Principal | ICD-10-CM

## 2013-12-09 DIAGNOSIS — C50911 Malignant neoplasm of unspecified site of right female breast: Secondary | ICD-10-CM

## 2013-12-09 DIAGNOSIS — C4359 Malignant melanoma of other part of trunk: Secondary | ICD-10-CM

## 2013-12-09 DIAGNOSIS — M5134 Other intervertebral disc degeneration, thoracic region: Secondary | ICD-10-CM | POA: Diagnosis not present

## 2013-12-09 DIAGNOSIS — M4804 Spinal stenosis, thoracic region: Secondary | ICD-10-CM | POA: Diagnosis not present

## 2013-12-09 DIAGNOSIS — I428 Other cardiomyopathies: Secondary | ICD-10-CM

## 2013-12-09 DIAGNOSIS — I10 Essential (primary) hypertension: Secondary | ICD-10-CM

## 2013-12-09 DIAGNOSIS — Z853 Personal history of malignant neoplasm of breast: Secondary | ICD-10-CM

## 2013-12-09 DIAGNOSIS — M4604 Spinal enthesopathy, thoracic region: Secondary | ICD-10-CM | POA: Insufficient documentation

## 2013-12-09 DIAGNOSIS — Z85820 Personal history of malignant melanoma of skin: Secondary | ICD-10-CM

## 2013-12-09 NOTE — Progress Notes (Signed)
ID: Tanya Mitchell OB: [birth year]  MR#: [id number]  UVO#:[id number]  PCP: Gerrit Heck, MD GYN:  Bobbye Charleston, MD SU:  OTHER MD: Sydnee Levans, MD (FAX # [phone]); Everardo All, MD;  Berle Mull, MD ([address], [address], [place], [place] [address])   CHIEF COMPLAINTS:  1)  Hx Left Breast Cancer 1995      2) Hx Right Breast Cancer 2007                 3)  Hx Melanoma, Stage IIIA   HISTORY OF PRESENT ILLNESS: From the earlier summary:  Tanya Mitchell's history of breast cancer dates back to June of 1995, when she had left modified radical mastectomy under [facility] for a pT1c pN1, stage IIA invasive ductal carcinoma, grade 2, estrogen receptor 97% positive, progesterone receptor 45% positive, treated according to CALGB 9394, sequential arm (doxorubicin x3, fourth dose apparently omitted, followed by high-dose cyclophosphamide x3), followed by tamoxifen for 5 years. There has been no evidence of disease recurrence.  Further, in October of 2007 she underwent right lumpectomy for a ductal carcinoma in situ, high-grade, estrogen and progesterone receptor negative, followed by adjuvant radiation.  In July of 2013 the patient was noted to have an irregular mole in her left upper back. It is not clear to me whether this might be related to her prior left breast irradiation. Shave biopsy of this area 09/15/2011 by Dr. Derrel Nip [phone]) showed a superficial spreading malignant melanoma, with Breslow depth 1.45 mm, Clark's level IV. There was brisk host response and 8 mitoses per high power field were noted. There was focal regression but no definitive ulceration. There was no satellitosis. There was no vascular and urgent. Margins were positive, but cleared by wide excision under Dr Stark Klein 10/10/2011. There was some residual malignant melanoma in situ but margins to 2 cm were obtained, with advancement flap closure of the skin defect. In addition left axillary sentinel  lymph node mapping was performed showing metastatic melanoma in the single lymph node removed.  Tanya Mitchell sought a second opinion at [facility] under Dr Joaquim Lai Collichio. There was a full discussion regarding completion nodal resection, use of adjuvant interferon, and BRAF testing with a view to participation in a research protocol then available. The patient considered all these options and after much discussion the decision was made not to pursue full nodal dissection, partly because the area in question had a ready undergone surgery and radiation. The patient opted against interferon adjuvant therapy because of its very marginal benefit and significant side effects. She declined consideration of a research study and BRAF testing was not performed.  Her subsequent history is as detailed below.  INTERVAL HISTORY: Tanya Mitchell returns today for follow-up of her breast cancer and malignant melanoma. Since her last visit here she traveled in [place] and [place], has been to [employer], and joined [employer] She hasn't actually started to exercise but she is thinking seriously about it. Aside from that she just had her restaging PET scan and it shows a little bit of uptake at T6 only. The SUV is barely above Background, but it is certainly noticeable on review. It arises from the marrow rather than the bone and there is no evidence of bony erosion. Radiology suggests an MRI of that area for further evaluation   REVIEW OF SYSTEMS: Aside from these issues, a detailed review of systems today was entirely negative.  PAST MEDICAL HISTORY: Past Medical History  Diagnosis Date  .  Cardiomyopathy secondary to chemotherapy     has improved   . Hyperlipidemia   . Hypertension     under control, has been on med. x 3 yrs.  . Breast cancer 1995 and 2007  . Melanoma in situ of back 09/2011    left  . Skin cancer 2013    Melanoma    PAST SURGICAL HISTORY: Past Surgical History  Procedure Laterality Date  . Breast  lumpectomy  12/21/2005    right  . Mastectomy  1995    with breast reconstruction - left    FAMILY HISTORY Family History  Problem Relation Age of Onset  . Lung cancer Father   . Cancer Father     lung  . Autism Son    the patient's father died from complications of lung cancer at the age of 49. He was a heavy smoker. The patient's mother died at the age of 64. Tanya Mitchell had no siblings. She underwent genetic testing for breast and ovarian cancer panel April of 2014. There were no demonstrable mutations in the BRCA or the other genes in the panel  GYNECOLOGIC HISTORY:  Menarche age 55, first live birth age 38. She is GX P2. She entered menopause at age 86, when she received her chemotherapy. She did not use hormone replacement. She did use birth control remotely, for approximately 14 years, with no complications.  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 Naiara's son Tanya Mitchell is handicapped, and works as an Training and development officer. 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 Jannie has 3 grandchildren and Kirby 2. Shaquila grew up in an Database administrator denomination  ADVANCED DIRECTIVES: In place   HEALTH MAINTENANCE: (Updated 06/03/2013) History  Substance Use Topics  . Smoking status: Former Smoker    Quit date: 02/14/1964  . Smokeless tobacco: Never Used  . Alcohol Use: 0.0 oz/week     Comment: daily glass of wine     Colonoscopy: 2011  PAP: November 2014  Bone density: January 2013, osteopenia  Lipid panel:   August 2013/Dr. Barnes   No Known Allergies  Current Outpatient Prescriptions  Medication Sig Dispense Refill  . aspirin 325 MG tablet Take 325 mg by mouth daily. 1/2 tablet daily      . atorvastatin (LIPITOR) 40 MG tablet Take 40 mg  by mouth daily.      . calcium carbonate (OS-CAL) 600 MG TABS Take 600 mg by mouth 2 (two) times daily with a meal.      . carvedilol (COREG) 12.5 MG tablet Take 1 tablet (12.5 mg total) by mouth 2 (two) times daily.  180 tablet  3  . cholecalciferol (VITAMIN D) 1000 UNITS tablet Take 2,000 Units by mouth daily.       . hydrochlorothiazide (HYDRODIURIL) 25 MG tablet Take 1 tablet (25 mg total) by mouth daily.  90 tablet  3  . ramipril (ALTACE) 10 MG capsule Take 1 capsule (10 mg total) by mouth daily.  90 capsule  3   No current facility-administered medications for this visit.    OBJECTIVE: And least white woman in no acute distress Filed Vitals:   12/09/13 1202  BP: 130/62  Pulse: 78  Temp: 98.5 F (36.9 C)  Resp: 18     Body mass index  is 24.19 kg/(m^2).    ECOG FS:0 - Asymptomatic Filed Weights   12/09/13 1202  Weight: 132 lb 4.8 oz (60.011 kg)   Sclerae unicteric, pupils equal and reactive, EOMs intact Oropharynx clear and moist-- no thrush or other lesions No cervical or supraclavicular adenopathy Lungs no rales or rhonchi Heart regular rate and rhythm Abd soft, nontender, positive bowel sounds MSK no focal spinal tenderness, no upper extremity lymphedema Neuro: nonfocal, well oriented, appropriate affect Breasts: The right breast is status post mastectomy and reconstruction. There is no evidence of local recurrence. The right axilla is benign. The left breast is unremarkable. Skin: The scar from the melanoma excision in the upper left posterior shoulder shows no evidence of satellitization, erythema, or swelling. I do not see any other lesions of concern over the torso anteriorly or posteriorly    LAB RESULTS:   Lab Results  Component Value Date   WBC 5.7 11/21/2013   NEUTROABS 2.9 11/21/2013   HGB 13.5 11/21/2013   HCT 41.3 11/21/2013   MCV 87.2 11/21/2013   PLT 379 11/21/2013      Chemistry      Component Value Date/Time   NA 140 11/21/2013 0955   NA 140  04/23/2012 1418   K 4.9 11/21/2013 0955   K 3.8 04/23/2012 1418   CL 99 04/23/2012 1418   CL 104 01/18/2012 0948   CO2 31* 11/21/2013 0955   CO2 30 04/23/2012 1418   BUN 17.2 11/21/2013 0955   BUN 15 04/23/2012 1418   CREATININE 0.7 11/21/2013 0955   CREATININE 0.58 04/23/2012 1418      Component Value Date/Time   CALCIUM 9.9 11/21/2013 0955   CALCIUM 10.4 04/23/2012 1418   ALKPHOS 63 11/21/2013 0955   ALKPHOS 75 04/23/2012 1418   AST 22 11/21/2013 0955   AST 24 04/23/2012 1418   ALT 30 11/21/2013 0955   ALT 26 04/23/2012 1418   BILITOT 0.35 11/21/2013 0955   BILITOT 0.5 04/23/2012 1418      STUDIES: Dg Thoracic Spine 2 View  12/09/2013   CLINICAL DATA:  Post melanoma on back, history breast cancer, hypertension, cardiomyopathy, abnormal PET-CT with increased to FDG accumulation in T6 vertebral body  EXAM: THORACIC SPINE - 2 VIEW  COMPARISON:  PET-CT 11/18/2013  FINDINGS: Twelve pairs of ribs.  Diffuse osseous demineralization.  Disc space narrowing and endplate spur formation at lower thoracic spine.  Vertebral body heights maintained without fracture or bone destruction.  Disc space narrowing C5-C6 with retrolisthesis.  No focal abnormalities are identified at T6.  IMPRESSION: Mild scattered degenerative disc disease changes as above.  No T6 vertebral abnormalities identified radiographically and cause of FDG accumulation is uncertain.  Consider MR thoracic spine for further evaluation.   Electronically Signed   By: Lavonia Dana M.D.   On: 12/09/2013 16:21   Nm Pet Image Restage (ps) Whole Body  11/18/2013   CLINICAL DATA:  Subsequent treatment strategy for melanoma. Restaging scan. Patient also as history of breast cancer.  EXAM: NUCLEAR MEDICINE PET WHOLE BODY  TECHNIQUE: 6.4 mCi F-18 FDG was injected intravenously. Full-ring PET imaging was performed from the vertex to the feet after the radiotracer. CT data was obtained and used for attenuation correction and anatomic localization.  FASTING BLOOD  GLUCOSE:  Value: 124m/dl  COMPARISON:  01/28/2013  FINDINGS: Head/Neck: No hypermetabolic lymph nodes in the neck.  Chest: No hypermetabolic mediastinal or hilar nodes. Previous left mastectomy and breast reconstruction surgery. Multiple small nodules are again noted.  These appear similar to the previous exam and remain too small to reliably characterize by PET-CT. These are favored to represent sequelae of inflammation or infection. No suspicious pulmonary nodules on the CT scan.  Abdomen/Pelvis: No abnormal hypermetabolic activity within the liver, pancreas, adrenal glands, or spleen. No hypermetabolic lymph nodes in the abdomen or pelvis.  Skeleton: Mild increased FDG uptake within the T6 vertebra is identified. This is slightly above background bone marrow activity but is new from previous exam. No corresponding bone abnormality identified on the CT images.  Extremities: No hypermetabolic activity to suggest metastasis.  IMPRESSION: 1. No specific areas of abnormal hypermetabolism to suggest recurrence of either malignant melanoma or breast cancer. 2. Mild increased FDG uptake within the T6 vertebra is identified which is slightly above background activity. If there is a clinical suspicion for bone metastasis then this area could be further investigated with an MRI of the thoracic spine. Otherwise, attention to this area on followup imaging would be advised. 3. Similar appearance of multiple small pulmonary nodules which are favored to represent sequelae of infection or inflammation.   Electronically Signed   By: Kerby Moors M.D.   On: 11/18/2013 16:06  ASSESSMENT: 68 y.o. BRCA negative [place] woman   (1) status post left mastectomy with TRAM reconstruction June of 1995 for a T1c N1, stage IIA invasive ductal carcinoma, grade 2, estrogen receptor 97% positive, progesterone receptor 45% positive, treated adjuvantly according to CALGB 9394 with 3 cycles of doxorubicin followed by 3 cycles of  cyclophosphamide, then tamoxifen for 5 years, off therapy completed November of 2000  (2) status post right lumpectomy October 2007 for high-grade ductal carcinoma in situ, with negative margins estrogen and progesterone receptor negative, followed by adjuvant radiation therapy   (3) malignant melanoma, T2a N1a = stage IIIA, as follows:  (a) status post shave biopsy from the right upper back 09/15/2011 for a superficial spreading melanoma, Breslow depth 1.45 mm, Clark's level IV, with focal regression but no ulceration.  (b) status post wide excision and sentinel lymph node sampling 10/10/2011 with residual malignant melanoma in situ, but negative margins; the single sentinel lymph node was involved by melanoma with the largest subcapsular deposit measuring 0.22 mm, no evidence of capsular involvement or extracapsular extension  (c) completion nodal dissection was discussed, but not performed in part because the patient had already had a complete left axillary lymph node dissection at the time of her 1995 left mastectomy (10 lymph nodes were removed at that time)  (d) adjuvant interferon was discussed with the patient when she visited [facility] in September 2013, but given that it side effects and marginal benefits the patient declined  (e) BRAF testing has not been done on the original tumor; this was also discussed with the patient at the time of her [facility] visit, as it might possibly lead to participation in a research protocol; the patient decided not to pursue that option  (4) PET scan shows nonspecific uptake only at T6. Plain films of the area do not show any degenerative disease there.  PLAN: Clinically Heena is doing fine, with no evidence of disease recurrence of either her breast cancer or more importantly her melanoma. We reviewed her CT of the chest in detail and I was hoping by obtaining plain films of the T-spine we could clarify the small amount of uptake at T6. However the plain films in that  area are really nonrevealing. Further review of the PET scan shows that the problem is pretty  much in the marrow. There is no evidence of bony destruction at T6.  I think we need to proceed to an MRI of this area to clear the question. I have followed Tanya Mitchell at home after the visit today and she is in agreement with this plan. Accordingly I will enter the order for a MRI of the thoracic spine in the next few days and she will call within 24 hours of that test being obtained to get results.   Otherwise she has close follow-up through her dermatologist's. She will return to see me in one year. We plan to repeat a PET scan prior to that visit.  Nanea has a good understanding of this plan. She agrees with it. She knows the goal of treatment in her case is cure. She will call with any problems that may develop before her next visit here.   Chauncey Cruel, MD   12/09/2013 6:15 PM

## 2013-12-09 NOTE — Telephone Encounter (Signed)
, °

## 2013-12-16 ENCOUNTER — Ambulatory Visit (HOSPITAL_COMMUNITY)
Admission: RE | Admit: 2013-12-16 | Discharge: 2013-12-16 | Disposition: A | Discharge status: Home / Self Care | Payer: Medicare Other | Source: Ambulatory Visit | Attending: Radiology | Admitting: Radiology

## 2013-12-16 ENCOUNTER — Other Ambulatory Visit: Payer: Self-pay | Admitting: Oncology

## 2013-12-16 DIAGNOSIS — I428 Other cardiomyopathies: Secondary | ICD-10-CM

## 2013-12-16 DIAGNOSIS — I429 Cardiomyopathy, unspecified: Secondary | ICD-10-CM | POA: Insufficient documentation

## 2013-12-16 DIAGNOSIS — M899 Disorder of bone, unspecified: Secondary | ICD-10-CM | POA: Insufficient documentation

## 2013-12-16 DIAGNOSIS — C50912 Malignant neoplasm of unspecified site of left female breast: Principal | ICD-10-CM

## 2013-12-16 DIAGNOSIS — I10 Essential (primary) hypertension: Secondary | ICD-10-CM | POA: Diagnosis not present

## 2013-12-16 DIAGNOSIS — C50911 Malignant neoplasm of unspecified site of right female breast: Secondary | ICD-10-CM | POA: Diagnosis not present

## 2013-12-16 DIAGNOSIS — M47814 Spondylosis without myelopathy or radiculopathy, thoracic region: Secondary | ICD-10-CM | POA: Diagnosis not present

## 2013-12-16 DIAGNOSIS — C4359 Malignant melanoma of other part of trunk: Secondary | ICD-10-CM | POA: Diagnosis not present

## 2013-12-16 MED ORDER — GADOBENATE DIMEGLUMINE 529 MG/ML IV SOLN
15.0000 mL | Freq: Once | INTRAVENOUS | Status: AC | PRN
  Administered 2013-12-16: 12 mL via INTRAVENOUS

## 2014-01-20 ENCOUNTER — Other Ambulatory Visit: Payer: Self-pay | Admitting: Obstetrics and Gynecology

## 2014-02-20 ENCOUNTER — Other Ambulatory Visit: Payer: Self-pay

## 2014-05-18 ENCOUNTER — Other Ambulatory Visit: Payer: Self-pay | Admitting: Cardiology

## 2014-07-14 ENCOUNTER — Other Ambulatory Visit: Payer: Self-pay

## 2014-07-14 DIAGNOSIS — Z1231 Encounter for screening mammogram for malignant neoplasm of breast: Secondary | ICD-10-CM

## 2014-08-18 ENCOUNTER — Other Ambulatory Visit: Payer: Self-pay | Admitting: Interventional Cardiology

## 2014-08-21 ENCOUNTER — Ambulatory Visit
Admission: RE | Admit: 2014-08-21 | Discharge: 2014-08-21 | Disposition: A | Discharge status: Home / Self Care | Payer: Medicare Other | Source: Ambulatory Visit

## 2014-08-21 DIAGNOSIS — Z1231 Encounter for screening mammogram for malignant neoplasm of breast: Secondary | ICD-10-CM

## 2014-09-23 ENCOUNTER — Ambulatory Visit: Payer: Medicare Other | Admitting: Nurse Practitioner

## 2014-10-06 ENCOUNTER — Encounter: Payer: Self-pay | Admitting: Nurse Practitioner

## 2014-10-06 ENCOUNTER — Ambulatory Visit (INDEPENDENT_AMBULATORY_CARE_PROVIDER_SITE_OTHER): Payer: Medicare Other | Admitting: Nurse Practitioner

## 2014-10-06 VITALS — BP 124/68 | HR 72 | Ht 62.0 in | Wt 133.1 lb

## 2014-10-06 DIAGNOSIS — E785 Hyperlipidemia, unspecified: Secondary | ICD-10-CM | POA: Diagnosis not present

## 2014-10-06 DIAGNOSIS — I428 Other cardiomyopathies: Secondary | ICD-10-CM

## 2014-10-06 DIAGNOSIS — I429 Cardiomyopathy, unspecified: Secondary | ICD-10-CM

## 2014-10-06 DIAGNOSIS — I10 Essential (primary) hypertension: Secondary | ICD-10-CM | POA: Diagnosis not present

## 2014-10-06 NOTE — Patient Instructions (Addendum)
We will be checking the following labs today - NONE   Medication Instructions:    Continue with your current medicines.     Testing/Procedures To Be Arranged:  N/A  Follow-Up:   See me in one year    Other Special Instructions:   N/A  Call the [place] office at [phone] if you have any questions, problems or concerns.

## 2014-10-06 NOTE — Progress Notes (Signed)
CARDIOLOGY OFFICE NOTE  Date:  10/06/2014    [name] Date of Birth: Jun 15, 1945 Medical Record #[id number]  PCP:  Gerrit Heck, MD  Cardiologist:  Aundra Dubin    Chief Complaint  Patient presents with  . Cardiomyopathy    One year check - seen for Dr. Aundra Dubin    History of Present Illness: Tanya Mitchell is a 69 y.o. female who presents today for a follow up visit. She is seen for Dr. Aundra Dubin. She is a former patient of Dr. Susa Simmonds. She has had a prior cardiomyopathy secondary to chemotherapy for breast cancer in the remote past. Her EF has recovered. Last echo was in July of 2011. Other issues include melanoma - now seeing Dr. Jana Hakim and Dr. Harriet Masson at [facility] for her melanoma.Labs are checked by PCP.   I last saw her a year ago - she was doing well.   She comes in today. She is here alone. She feels good. No chest pain. Not short of breath. Tolerating her medicines. Seeing PCP next week with labs. She is quite happy with how she is doing.   Past Medical History  Diagnosis Date  . Cardiomyopathy secondary to chemotherapy     has improved   . Hyperlipidemia   . Hypertension     under control, has been on med. x 3 yrs.  . Breast cancer 1995 and 2007  . Melanoma in situ of back 09/2011    left  . Skin cancer 2013    Melanoma    Past Surgical History  Procedure Laterality Date  . Breast lumpectomy  12/21/2005    right  . Mastectomy  1995    with breast reconstruction - left     Medications: Current Outpatient Prescriptions  Medication Sig Dispense Refill  . aspirin 325 MG tablet Take 325 mg by mouth daily. 1/2 tablet daily    . atorvastatin (LIPITOR) 40 MG tablet Take 40 mg by mouth daily.    . calcium carbonate (OS-CAL) 600 MG TABS Take 600 mg by mouth 2 (two) times daily with a meal.    . carvedilol (COREG) 12.5 MG tablet TAKE 1 BY MOUTH TWICE DAILY 180 tablet 1  . cholecalciferol (VITAMIN D) 1000 UNITS tablet Take 2,000 Units by mouth daily.       . hydrochlorothiazide (HYDRODIURIL) 25 MG tablet TAKE 1 BY MOUTH DAILY 90 tablet 0  . ramipril (ALTACE) 10 MG capsule TAKE 1 BY MOUTH DAILY 90 capsule 1   No current facility-administered medications for this visit.    Allergies: No Known Allergies  Social History: The patient  reports that she quit smoking about 50 years ago. She has never used smokeless tobacco. She reports that she drinks alcohol. She reports that she does not use illicit drugs.   Family History: The patient's family history includes Autism in her son; Cancer in her father; Lung cancer in her father.   Review of Systems: Please see the history of present illness.   Otherwise, the review of systems is positive for none.   All other systems are reviewed and negative.   Physical Exam: VS:  There were no vitals taken for this visit. [name]  BMI There is no weight on file to calculate BMI.  Wt Readings from Last 3 Encounters:  12/09/13 132 lb 4.8 oz (60.011 kg)  10/06/13 128 lb 6.4 oz (58.242 kg)  06/03/13 125 lb 11.2 oz (57.017 kg)    General: Pleasant. Well developed, well nourished and  in no acute distress.  HEENT: Normal. Neck: Supple, no JVD, carotid bruits, or masses noted.  Cardiac: Regular rate and rhythm. No murmurs, rubs, or gallops. No edema.  Respiratory:  Lungs are clear to auscultation bilaterally with normal work of breathing.  GI: Soft and nontender.  MS: No deformity or atrophy. Gait and ROM intact. Skin: Warm and dry. Color is normal.  Neuro:  Strength and sensation are intact and no gross focal deficits noted.  Psych: Alert, appropriate and with normal affect.   LABORATORY DATA:  EKG:  EKG is ordered today. This demonstrates NSR.  Lab Results  Component Value Date   WBC 5.7 11/21/2013   HGB 13.5 11/21/2013   HCT 41.3 11/21/2013   PLT 379 11/21/2013   GLUCOSE 153* 11/21/2013   ALT 30 11/21/2013   AST 22 11/21/2013   NA 140 11/21/2013   K 4.9 11/21/2013   CL 99 04/23/2012    CREATININE 0.7 11/21/2013   BUN 17.2 11/21/2013   CO2 31* 11/21/2013    BNP (last 3 results) No results for input(s): BNP in the last 8760 hours.  ProBNP (last 3 results) No results for input(s): PROBNP in the last 8760 hours.   Other Studies Reviewed Today:  Echo Study Conclusions from 2014  - Left ventricle: The cavity size was normal. Wall thickness was normal. The estimated ejection fraction was 55%. - Mitral valve: Mild regurgitation. - Atrial septum: No defect or patent foramen ovale was identified.  Assessment/Plan: 1. History of CM - last echo was in 2014 - EF normal. She is doing great clinically. No change in her current regimen. I will see her back in a year.  2. Melanoma - followed by oncology at [facility]  3. HLD - now on Lipitor. She has her labs with her PCP  Current medicines are reviewed with the patient today.  The patient does not have concerns regarding medicines other than what has been noted above.  The following changes have been made:  See above.  Labs/ tests ordered today include:   No orders of the defined types were placed in this encounter.     Disposition:   FU with me in 1 year.   Patient is agreeable to this plan and will call if any problems develop in the interim.   Signed: Burtis Junes, RN, ANP-C 10/06/2014 2:27 PM  [facility] [address] [place], [place]  [address] Phone: [phone] Fax: [phone]

## 2014-10-08 ENCOUNTER — Other Ambulatory Visit: Payer: Self-pay | Admitting: Cardiology

## 2014-12-02 ENCOUNTER — Encounter (HOSPITAL_COMMUNITY)
Admission: RE | Admit: 2014-12-02 | Discharge: 2014-12-02 | Disposition: A | Discharge status: Home / Self Care | Payer: Medicare Other | Source: Ambulatory Visit | Attending: Oncology | Admitting: Oncology

## 2014-12-02 ENCOUNTER — Other Ambulatory Visit (HOSPITAL_BASED_OUTPATIENT_CLINIC_OR_DEPARTMENT_OTHER): Payer: Medicare Other

## 2014-12-02 ENCOUNTER — Other Ambulatory Visit: Payer: Self-pay | Admitting: Oncology

## 2014-12-02 DIAGNOSIS — C50911 Malignant neoplasm of unspecified site of right female breast: Secondary | ICD-10-CM | POA: Diagnosis not present

## 2014-12-02 DIAGNOSIS — R918 Other nonspecific abnormal finding of lung field: Secondary | ICD-10-CM | POA: Diagnosis not present

## 2014-12-02 DIAGNOSIS — C50912 Malignant neoplasm of unspecified site of left female breast: Secondary | ICD-10-CM | POA: Insufficient documentation

## 2014-12-02 DIAGNOSIS — C4359 Malignant melanoma of other part of trunk: Secondary | ICD-10-CM

## 2014-12-02 DIAGNOSIS — Z85820 Personal history of malignant melanoma of skin: Secondary | ICD-10-CM | POA: Diagnosis not present

## 2014-12-02 LAB — CBC WITH DIFFERENTIAL/PLATELET
BASO%: 1.2 % (ref 0.0–2.0)
Basophils Absolute: 0.1 10*3/uL (ref 0.0–0.1)
EOS%: 4.8 % (ref 0.0–7.0)
Eosinophils Absolute: 0.3 10*3/uL (ref 0.0–0.5)
HCT: 38.9 % (ref 34.8–46.6)
HGB: 13.2 g/dL (ref 11.6–15.9)
LYMPH%: 34.1 % (ref 14.0–49.7)
MCH: 29.3 pg (ref 25.1–34.0)
MCHC: 34 g/dL (ref 31.5–36.0)
MCV: 86 fL (ref 79.5–101.0)
MONO#: 0.5 10*3/uL (ref 0.1–0.9)
MONO%: 7.9 % (ref 0.0–14.0)
NEUT#: 3.2 10*3/uL (ref 1.5–6.5)
NEUT%: 52 % (ref 38.4–76.8)
Platelets: 361 10*3/uL (ref 145–400)
RBC: 4.52 10*6/uL (ref 3.70–5.45)
RDW: 13.3 % (ref 11.2–14.5)
WBC: 6.1 10*3/uL (ref 3.9–10.3)
lymph#: 2.1 10*3/uL (ref 0.9–3.3)

## 2014-12-02 LAB — COMPREHENSIVE METABOLIC PANEL (CC13)
ALT: 24 U/L (ref 0–55)
AST: 18 U/L (ref 5–34)
Albumin: 3.6 g/dL (ref 3.5–5.0)
Alkaline Phosphatase: 60 U/L (ref 40–150)
Anion Gap: 9 mEq/L (ref 3–11)
BUN: 12.3 mg/dL (ref 7.0–26.0)
CO2: 25 mEq/L (ref 22–29)
Calcium: 9.3 mg/dL (ref 8.4–10.4)
Chloride: 105 mEq/L (ref 98–109)
Creatinine: 0.7 mg/dL (ref 0.6–1.1)
EGFR: 88 mL/min/{1.73_m2} — ABNORMAL LOW (ref >90)
Glucose: 182 mg/dl — ABNORMAL HIGH (ref 70–140)
Potassium: 3.8 mEq/L (ref 3.5–5.1)
Sodium: 139 mEq/L (ref 136–145)
Total Bilirubin: 0.42 mg/dL (ref 0.20–1.20)
Total Protein: 6.7 g/dL (ref 6.4–8.3)

## 2014-12-02 LAB — GLUCOSE, CAPILLARY: Glucose-Capillary: 179 mg/dL — ABNORMAL HIGH (ref 65–99)

## 2014-12-02 MED ORDER — FLUDEOXYGLUCOSE F - 18 (FDG) INJECTION
6.6000 | Freq: Once | INTRAVENOUS | Status: DC | PRN
  Administered 2014-12-02: 6.6 via INTRAVENOUS
  Filled 2014-12-02: qty 6.6

## 2014-12-08 ENCOUNTER — Ambulatory Visit (HOSPITAL_BASED_OUTPATIENT_CLINIC_OR_DEPARTMENT_OTHER): Payer: Medicare Other | Admitting: Oncology

## 2014-12-08 ENCOUNTER — Telehealth: Payer: Self-pay | Admitting: Oncology

## 2014-12-08 VITALS — BP 121/64 | HR 71 | Temp 98.2°F | Resp 18 | Ht 62.0 in | Wt 134.8 lb

## 2014-12-08 DIAGNOSIS — D0359 Melanoma in situ of other part of trunk: Secondary | ICD-10-CM

## 2014-12-08 DIAGNOSIS — C792 Secondary malignant neoplasm of skin: Secondary | ICD-10-CM

## 2014-12-08 DIAGNOSIS — M8580 Other specified disorders of bone density and structure, unspecified site: Secondary | ICD-10-CM | POA: Diagnosis not present

## 2014-12-08 DIAGNOSIS — C50911 Malignant neoplasm of unspecified site of right female breast: Secondary | ICD-10-CM

## 2014-12-08 DIAGNOSIS — Z853 Personal history of malignant neoplasm of breast: Secondary | ICD-10-CM | POA: Diagnosis not present

## 2014-12-08 DIAGNOSIS — C50912 Malignant neoplasm of unspecified site of left female breast: Principal | ICD-10-CM

## 2014-12-08 NOTE — Telephone Encounter (Signed)
Appointments made and avs printed for patient °

## 2014-12-08 NOTE — Progress Notes (Signed)
ID: Lawson Radar Grabill OB: May 16, 1945  MR#: [id number]  RNH#:[id number]  PCP: Gerrit Heck, MD GYN:  Bobbye Charleston, MD SU:  OTHER MD: Sydnee Levans, MD (FAX # [phone]); Everardo All, MD;  Berle Mull, MD ([address], [place], [place] [address])   CHIEF COMPLAINTS:  1)  Hx Left Breast Cancer 1995      2) Hx Right Breast Cancer 2007                 3)  Hx Melanoma, Stage IIIA   HISTORY OF PRESENT ILLNESS: From the earlier summary:  Tanya Mitchell's history of breast cancer dates back to June of 1995, when she had left modified radical mastectomy under [facility] for a pT1c pN1, stage IIA invasive ductal carcinoma, grade 2, estrogen receptor 97% positive, progesterone receptor 45% positive, treated according to CALGB 9394, sequential arm (doxorubicin x3, fourth dose apparently omitted, followed by high-dose cyclophosphamide x3), followed by tamoxifen for 5 years. There has been no evidence of disease recurrence.  Further, in October of 2007 she underwent right lumpectomy for a ductal carcinoma in situ, high-grade, estrogen and progesterone receptor negative, followed by adjuvant radiation.  In July of 2013 the patient was noted to have an irregular mole in her left upper back. It is not clear to me whether this might be related to her prior left breast irradiation. Shave biopsy of this area 09/15/2011 by Dr. Derrel Nip [id number]) showed a superficial spreading malignant melanoma, with Breslow depth 1.45 mm, Clark's level IV. There was brisk host response and 8 mitoses per high power field were noted. There was focal regression but no definitive ulceration. There was no satellitosis. There was no vascular and urgent. Margins were positive, but cleared by wide excision under Dr Stark Klein 10/10/2011. There was some residual malignant melanoma in situ but margins to 2 cm were obtained, with advancement flap closure of the skin defect. In addition left axillary sentinel  lymph node mapping was performed showing metastatic melanoma in the single lymph node removed.  Emry sought a second opinion at [facility] under Dr Joaquim Lai Collichio. There was a full discussion regarding completion nodal resection, use of adjuvant interferon, and BRAF testing with a view to participation in a research protocol then available. The patient considered all these options and after much discussion the decision was made not to pursue full nodal dissection, partly because the area in question had a ready undergone surgery and radiation. The patient opted against interferon adjuvant therapy because of its very marginal benefit and significant side effects. She declined consideration of a research study and BRAF testing was not performed.  Her subsequent history is as detailed below.  INTERVAL HISTORY: Tanya Mitchell returns today for follow-up of her breast cancer and malignant melanoma. The interval history is generally unremarkable. She is doing "fine". She continues to do a little bit of traveling. There are some family issues which unfortunately have not completely resolved, but she and her husband in particular are doing well. They exercise that "the clot" regularly. 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 Cardiomyopathy secondary to chemotherapy     has improved   . Hyperlipidemia   . Hypertension     under control, has been on med. x 3 yrs.  . Breast cancer 1995 and 2007  . Melanoma in situ of back 09/2011  left  . Skin cancer 2013    Melanoma    PAST SURGICAL HISTORY: Past Surgical History  Procedure Laterality Date  . Breast lumpectomy  12/21/2005    right  . Mastectomy  1995    with breast reconstruction - left    FAMILY HISTORY Family History  Problem Relation Age of Onset  . Lung cancer Father   . Cancer Father     lung  . Autism  Son    the patient's father died from complications of lung cancer at the age of 71. He was a heavy smoker. The patient's mother died at the age of 61. Tanya Mitchell had no siblings. She underwent genetic testing for breast and ovarian cancer panel April of 2014. There were no demonstrable mutations in the BRCA or the other genes in the panel  GYNECOLOGIC HISTORY:  Menarche age 56, first live birth age 76. She is GX P2. She entered menopause at age 69, when she received her chemotherapy. She did not use hormone replacement. She did use birth control remotely, for approximately 14 years, with no complications.  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  She also volunteers at the [facility]. 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 Tanya Mitchell has 3 grandchildren and [place] 2. 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 Smoking status: Former Smoker    Quit date: 02/14/1964  . Smokeless tobacco: Never Used  . Alcohol Use: 0.0 oz/week     Comment: daily glass of wine     Colonoscopy: 2011  PAP: November 2014  Bone density: January 2013, osteopenia  Lipid panel:   August 2013/Dr. Barnes   No Known Allergies  Current Outpatient Prescriptions  Medication Sig Dispense Refill  . aspirin 325 MG tablet Take 325 mg by mouth daily. 1/2 tablet daily    . atorvastatin (LIPITOR) 40 MG tablet Take 40 mg by mouth daily.    . calcium carbonate (OS-CAL) 600 MG TABS Take 600 mg by mouth 2 (two) times daily with a meal.    . carvedilol (COREG) 12.5 MG tablet TAKE 1 BY MOUTH TWICE DAILY 180 tablet 3  . cholecalciferol  (VITAMIN D) 1000 UNITS tablet Take 2,000 Units by mouth daily.     . hydrochlorothiazide (HYDRODIURIL) 25 MG tablet TAKE 1 BY MOUTH DAILY 90 tablet 0  . ramipril (ALTACE) 10 MG capsule TAKE 1 BY MOUTH DAILY 90 capsule 3   No current facility-administered medications for this visit.    OBJECTIVE: And least white woman who appears well Filed Vitals:   12/08/14 1151  BP: 121/64  Pulse: 71  Temp: 98.2 F (36.8 C)  Resp: 18     Body mass index is 24.65 kg/(m^2).    ECOG FS:0 - Asymptomatic Filed Weights   12/08/14 1151  Weight: 134 lb 12.8 oz (61.145 kg)   Sclerae unicteric, pupils round and equal Oropharynx clear and moist-- no thrush or other lesions No cervical or supraclavicular adenopathy Lungs no rales or rhonchi Heart regular rate and rhythm Abd soft, nontender, positive bowel sounds MSK no focal spinal tenderness, no upper extremity lymphedema Neuro: nonfocal, well oriented, appropriate affect  Breasts: Deferred  LAB RESULTS:   Lab Results  Component Value Date   WBC 6.1 12/02/2014   NEUTROABS 3.2 12/02/2014   HGB 13.2 12/02/2014   HCT 38.9 12/02/2014   MCV 86.0 12/02/2014   PLT 361 12/02/2014      Chemistry      Component Value Date/Time   NA 139 12/02/2014 0820   NA 140 04/23/2012 1418   K 3.8 12/02/2014 0820   K 3.8 04/23/2012 1418   CL 99 04/23/2012 1418   CL 104 01/18/2012 0948   CO2 25 12/02/2014 0820   CO2 30 04/23/2012 1418   BUN 12.3 12/02/2014 0820   BUN 15 04/23/2012 1418   CREATININE 0.7 12/02/2014 0820   CREATININE 0.58 04/23/2012 1418      Component Value Date/Time   CALCIUM 9.3 12/02/2014 0820   CALCIUM 10.4 04/23/2012 1418   ALKPHOS 60 12/02/2014 0820   ALKPHOS 75 04/23/2012 1418   AST 18 12/02/2014 0820   AST 24 04/23/2012 1418   ALT 24 12/02/2014 0820   ALT 26 04/23/2012 1418   BILITOT 0.42 12/02/2014 0820   BILITOT 0.5 04/23/2012 1418      STUDIES: Nm Pet Image Restage (ps) Whole Body  12/02/2014  CLINICAL DATA:   Subsequent treatment strategy for melanoma and breast cancer EXAM: NUCLEAR MEDICINE PET WHOLE BODY TECHNIQUE: 6.6 mCi F-18 FDG was injected intravenously. Full-ring PET imaging was performed from the vertex to the feet after the radiotracer. CT data was obtained and used for attenuation correction and anatomic localization. FASTING BLOOD GLUCOSE:  Value:  179 mg/dl COMPARISON:  11/18/2013 FINDINGS: Head/Neck: No hypermetabolic lymph nodes in the neck. Chest: No hypermetabolic mediastinal or hilar nodes. Scattered small pulmonary nodules are again noted. Index nodule in the right middle lobe Measures 5 mm, image number 45 of series 9. Unchanged from previous exam. Index nodule in the left lower lobe Measures 4 mm, image 55 of series 9. Unchanged from previous exam. Abdomen/Pelvis: No abnormal hypermetabolic activity within the liver, pancreas, adrenal glands, or spleen. No hypermetabolic lymph nodes in the abdomen or pelvis. Skeleton: No focal hypermetabolic activity to suggest skeletal metastasis. Extremities: No hypermetabolic activity to suggest metastasis. IMPRESSION: 1. No evidence for residual or recurrent hypermetabolic tumor. 2. Similar appearance of multiple small nonspecific pulmonary nodules which are too small to characterize by PET-CT. Electronically Signed   By: Kerby Moors M.D.   On: 12/02/2014 11:39   CLINICAL DATA: Status post removal of melanoma on the back. Also history of breast cancer. Abnormal increased FDG uptake within the T6 vertebra on head CT.  EXAM: MRI THORACIC SPINE WITHOUT AND WITH CONTRAST  TECHNIQUE: Multiplanar and multiecho pulse sequences of the thoracic spine were obtained without and with intravenous contrast.  CONTRAST: 34m MULTIHANCE GADOBENATE DIMEGLUMINE 529 MG/ML IV SOLN  COMPARISON: PET-CT 11/18/2013. Thoracic spine radiographs 12/09/2013. Chest CT 07/11/2012.  FINDINGS: Vertebral alignment is normal. Vertebral body heights are preserved. A  hemangioma is noted in the L1 vertebral body. Mild fatty degenerative endplate changes are present in the mid and lower thoracic spine. No vertebral marrow edema or abnormal enhancement is identified.  Mild disc bulging is noted at C6-7 without gross spinal stenosis. Small left paracentral disc protrusions are noted at T1-2 and T2-3. Small right paracentral disc protrusions are present at T7-8 and T8-9. No spinal canal or neural foraminal stenosis is seen. Small perineural cysts are incidentally noted in the right neural foramen at T9-10 and in the left neural foramen at  T11-12. Thoracic spinal cord is normal in caliber and signal. Conus medullaris terminates at the superior aspect of L1. Paraspinal soft tissues are unremarkable.  IMPRESSION: 1. No evidence of metastatic disease in the thoracic spine. 2. Mild thoracic spondylosis without stenosis.   Electronically Signed  By: Logan Bores  On: 12/16/2013 09:51  CLINICAL DATA: Screening.  EXAM: DIGITAL SCREENING UNILATERAL RIGHT MAMMOGRAM WITH CAD  COMPARISON: Previous exam(s).  ACR Breast Density Category b: There are scattered areas of fibroglandular density.  FINDINGS: There are no findings suspicious for malignancy. Images were processed with CAD.  IMPRESSION: No mammographic evidence of malignancy. A result letter of this screening mammogram will be mailed directly to the patient.  RECOMMENDATION: Screening mammogram in one year. (Code:SM-B-01Y)  BI-RADS CATEGORY 1: Negative.   Electronically Signed  By: Lajean Manes M.D.  On: 08/21/2014 12:55  ASSESSMENT: 69 y.o. BRCA negative [place] woman   (1) status post left mastectomy with TRAM reconstruction June of 1995 for a T1c N1, stage IIA invasive ductal carcinoma, grade 2, estrogen receptor 97% positive, progesterone receptor 45% positive, treated adjuvantly according to CALGB 9394 with 3 cycles of doxorubicin followed by 3 cycles of  cyclophosphamide, then tamoxifen for 5 years, off therapy completed November of 2000  (2) status post right lumpectomy October 2007 for high-grade ductal carcinoma in situ, with negative margins estrogen and progesterone receptor negative, followed by adjuvant radiation therapy   (3) malignant melanoma, T2a N1a = stage IIIA, as follows:  (a) status post shave biopsy from the right upper back 09/15/2011 for a superficial spreading melanoma, Breslow depth 1.45 mm, Clark's level IV, with focal regression but no ulceration.  (b) status post wide excision and sentinel lymph node sampling 10/10/2011 with residual malignant melanoma in situ, but negative margins; the single sentinel lymph node was involved by melanoma with the largest subcapsular deposit measuring 0.22 mm, no evidence of capsular involvement or extracapsular extension  (c) completion nodal dissection was discussed, but not performed in part because the patient had already had a complete left axillary lymph node dissection at the time of her 1995 left mastectomy (10 lymph nodes were removed at that time)  (d) adjuvant interferon was discussed with the patient when she visited [facility] in September 2013, but given that it side effects and marginal benefits the patient declined  (e) BRAF testing has not been done on the original tumor; this was also discussed with the patient at the time of her [facility] visit, as it might possibly lead to participation in a research protocol; the patient decided not to pursue that option  (4) PET scan shows nonspecific uptake only at T6.   (a) MRI of the thoracic spine November 2015 showed no evidence of metastatic disease  (b) repeat PET scan 12/02/2014 shows no residual or recurrent hypermetabolic tumor.   PLAN: [place]asia is doing fine with no evidence of disease recurrence clinically and with a very reassuring PET scan obtained last week  We discussed follow-up options in detail. She sees her dermatologist twice a  year. She will be seeing Dr. Marni Griffon at [facility] in May so she can return to see me again in November. We are not going to obtain a PET scan next year unless she has symptoms. However when she gets to the 5 year mark, which will be to years from now, I think it would be prudent to repeat a PET scan and we will do that.  She cut back on her calcium because of a high serum  level, but I think she can stop it altogether. There is fairly good data recently that most people obtaining enough calcium from your diet so long as they replace vitamin D, which she is doing.  She of course had her mammogram in July and will continue on that schedule.  Netasha knows to call for any problems that may develop before next visit here.    Chauncey Cruel, MD   12/08/2014 11:54 AM

## 2015-02-09 ENCOUNTER — Other Ambulatory Visit: Payer: Self-pay | Admitting: Cardiology

## 2015-03-02 ENCOUNTER — Telehealth: Payer: Self-pay | Admitting: Oncology

## 2015-03-02 NOTE — Telephone Encounter (Signed)
Patient request to move 11/14 to 11/21,done

## 2015-04-09 DIAGNOSIS — Z85820 Personal history of malignant melanoma of skin: Secondary | ICD-10-CM | POA: Diagnosis not present

## 2015-04-09 DIAGNOSIS — D225 Melanocytic nevi of trunk: Secondary | ICD-10-CM | POA: Diagnosis not present

## 2015-04-09 DIAGNOSIS — L821 Other seborrheic keratosis: Secondary | ICD-10-CM | POA: Diagnosis not present

## 2015-04-09 DIAGNOSIS — D2272 Melanocytic nevi of left lower limb, including hip: Secondary | ICD-10-CM | POA: Diagnosis not present

## 2015-04-09 DIAGNOSIS — D1801 Hemangioma of skin and subcutaneous tissue: Secondary | ICD-10-CM | POA: Diagnosis not present

## 2015-04-09 DIAGNOSIS — D2261 Melanocytic nevi of right upper limb, including shoulder: Secondary | ICD-10-CM | POA: Diagnosis not present

## 2015-04-09 DIAGNOSIS — L814 Other melanin hyperpigmentation: Secondary | ICD-10-CM | POA: Diagnosis not present

## 2015-06-28 DIAGNOSIS — M859 Disorder of bone density and structure, unspecified: Secondary | ICD-10-CM | POA: Diagnosis not present

## 2015-06-28 DIAGNOSIS — M8589 Other specified disorders of bone density and structure, multiple sites: Secondary | ICD-10-CM | POA: Diagnosis not present

## 2015-06-30 DIAGNOSIS — T1512XA Foreign body in conjunctival sac, left eye, initial encounter: Secondary | ICD-10-CM | POA: Diagnosis not present

## 2015-08-24 ENCOUNTER — Other Ambulatory Visit: Payer: Self-pay | Admitting: Oncology

## 2015-08-24 DIAGNOSIS — Z1231 Encounter for screening mammogram for malignant neoplasm of breast: Secondary | ICD-10-CM

## 2015-08-24 DIAGNOSIS — C4352 Malignant melanoma of skin of breast: Secondary | ICD-10-CM | POA: Diagnosis not present

## 2015-09-02 ENCOUNTER — Ambulatory Visit
Admission: RE | Admit: 2015-09-02 | Discharge: 2015-09-02 | Disposition: A | Discharge status: Home / Self Care | Payer: Medicare Other | Source: Ambulatory Visit | Attending: Oncology | Admitting: Oncology

## 2015-09-02 DIAGNOSIS — Z1231 Encounter for screening mammogram for malignant neoplasm of breast: Secondary | ICD-10-CM

## 2015-09-27 ENCOUNTER — Other Ambulatory Visit (HOSPITAL_COMMUNITY): Payer: Self-pay | Admitting: *Deleted

## 2015-09-27 MED ORDER — HYDROCHLOROTHIAZIDE 25 MG PO TABS: ORAL_TABLET | ORAL | 2 refills | Status: DC

## 2015-10-13 ENCOUNTER — Ambulatory Visit (INDEPENDENT_AMBULATORY_CARE_PROVIDER_SITE_OTHER): Payer: Medicare Other | Admitting: Nurse Practitioner

## 2015-10-13 ENCOUNTER — Encounter: Payer: Self-pay | Admitting: Nurse Practitioner

## 2015-10-13 VITALS — BP 110/80 | HR 69 | Ht 62.0 in | Wt 131.1 lb

## 2015-10-13 DIAGNOSIS — I10 Essential (primary) hypertension: Secondary | ICD-10-CM

## 2015-10-13 DIAGNOSIS — E785 Hyperlipidemia, unspecified: Secondary | ICD-10-CM | POA: Diagnosis not present

## 2015-10-13 DIAGNOSIS — I429 Cardiomyopathy, unspecified: Secondary | ICD-10-CM

## 2015-10-13 DIAGNOSIS — I428 Other cardiomyopathies: Secondary | ICD-10-CM

## 2015-10-13 MED ORDER — HYDROCHLOROTHIAZIDE 25 MG PO TABS: ORAL_TABLET | ORAL | 3 refills | Status: DC

## 2015-10-13 NOTE — Progress Notes (Signed)
CARDIOLOGY OFFICE NOTE  Date:  10/13/2015    Tanya Mitchell Date of Birth: 10-12-1945 Medical Record [id number]  PCP:  Gerrit Heck, MD  Cardiologist:  Annabell Howells    Chief Complaint  Patient presents with  . Cardiomyopathy  . Hyperlipidemia    1 year check - seen for Dr. Aundra Dubin    History of Present Illness: Tanya Mitchell is a 70 y.o. female who presents today for a one year check. She is seen for Dr. Aundra Dubin. She is a former patient of Dr. Susa Simmonds.   She has had a prior cardiomyopathy secondary to chemotherapy for breast cancer in the remote past. Her EF has recovered. Last echo was in July of 2011. Other issues include melanoma - now seeing Dr. Jana Hakim and Dr. Harriet Masson at [facility] for her melanoma.Labs are checked by PCP.   I last saw her a year ago - she was doing well.   She comes in today. She is here alone. Continues to do well. No chest pain. Not short of breath. Staying active. She is happy with how she is doing. Feels good on her medicines. Has been placed on low dose metformin by her PCP. Her labs are done by PCP.   Past Medical History:  Diagnosis Date  . Breast cancer ([place]) 1995 and 2007  . Cardiomyopathy secondary to chemotherapy [facility])    has improved   . Hyperlipidemia   . Hypertension    under control, has been on med. x 3 yrs.  . Melanoma in situ of back ([place]) 09/2011   left  . Skin cancer 2013   Melanoma    Past Surgical History:  Procedure Laterality Date  . BREAST LUMPECTOMY  12/21/2005   right  . MASTECTOMY  1995   with breast reconstruction - left     Medications: Current Outpatient Prescriptions  Medication Sig Dispense Refill  . aspirin 325 MG tablet Take 325 mg by mouth daily. 1/2 tablet daily    . atorvastatin (LIPITOR) 40 MG tablet Take 40 mg by mouth daily.    . carvedilol (COREG) 12.5 MG tablet TAKE 1 BY MOUTH TWICE DAILY 180 tablet 3  . cholecalciferol (VITAMIN D) 1000 UNITS tablet Take 2,000 Units by mouth  daily.     . hydrochlorothiazide (HYDRODIURIL) 25 MG tablet TAKE 1 BY MOUTH DAILY 90 tablet 3  . metFORMIN (GLUCOPHAGE) 500 MG tablet 250 mg twice a day.    . ramipril (ALTACE) 10 MG capsule TAKE 1 BY MOUTH DAILY 90 capsule 3   No current facility-administered medications for this visit.     Allergies: No Known Allergies  Social History: The patient  reports that she quit smoking about 51 years ago. She has never used smokeless tobacco. She reports that she drinks alcohol. She reports that she does not use drugs.   Family History: The patient's family history includes Autism in her son; Cancer in her father; Lung cancer in her father.   Review of Systems: Please see the history of present illness.   Otherwise, the review of systems is positive for none.   All other systems are reviewed and negative.   Physical Exam: VS:  BP 110/80   Pulse 69   Ht 5\' 2"  (1.575 m)   Wt 131 lb 1.9 oz (59.5 kg)   BMI 23.98 kg/m  .  BMI Body mass index is 23.98 kg/m.  Wt Readings from Last 3 Encounters:  10/13/15 131 lb 1.9 oz (59.5 kg)  12/08/14 134 lb 12.8 oz (61.1 kg)  10/06/14 133 lb 1.9 oz (60.4 kg)    General: Pleasant. Well developed, well nourished and in no acute distress.   HEENT: Normal.  Neck: Supple, no JVD, carotid bruits, or masses noted.  Cardiac: Regular rate and rhythm. No murmurs, rubs, or gallops. No edema.  Respiratory:  Lungs are clear to auscultation bilaterally with normal work of breathing.  GI: Soft and nontender.  MS: No deformity or atrophy. Gait and ROM intact.  Skin: Warm and dry. Color is normal.  Neuro:  Strength and sensation are intact and no gross focal deficits noted.  Psych: Alert, appropriate and with normal affect.   LABORATORY DATA:  EKG:  EKG is ordered today. This demonstrates NSR with septal Q's - no change from last tracing.  Lab Results  Component Value Date   WBC 6.1 12/02/2014   HGB 13.2 12/02/2014   HCT 38.9 12/02/2014   PLT 361  12/02/2014   GLUCOSE 182 (H) 12/02/2014   ALT 24 12/02/2014   AST 18 12/02/2014   NA 139 12/02/2014   K 3.8 12/02/2014   CL 99 04/23/2012   CREATININE 0.7 12/02/2014   BUN 12.3 12/02/2014   CO2 25 12/02/2014    BNP (last 3 results) No results for input(s): BNP in the last 8760 hours.  ProBNP (last 3 results) No results for input(s): PROBNP in the last 8760 hours.   Other Studies Reviewed Today:  Echo Study Conclusions from 2014  - Left ventricle: The cavity size was normal. Wall thickness was normal. The estimated ejection fraction was 55%. - Mitral valve: Mild regurgitation. - Atrial septum: No defect or patent foramen ovale was identified.  Assessment/Plan: 1. History of CM - last echo was in 2014 - EF normal. She is doing great clinically. No change in her current regimen. I will see her back in a year.  2. Melanoma - followed by oncology at [facility]  3. HLD -  She has her labs with her PCP  4. HTN - BP looks great on her current regimen.   Current medicines are reviewed with the patient today.  The patient does not have concerns regarding medicines other than what has been noted above.  The following changes have been made:  See above.  Labs/ tests ordered today include:    Orders Placed This Encounter  Procedures  . EKG 12-Lead     Disposition:   FU with me in 1 year.   Patient is agreeable to this plan and will call if any problems develop in the interim.   Signed: Burtis Junes, RN, ANP-C 10/13/2015 9:39 AM  [facility] [address] [place], [place]  [address] Phone: [phone] Fax: [phone]

## 2015-10-13 NOTE — Patient Instructions (Addendum)
We will be checking the following labs today - NONE   Medication Instructions:    Continue with your current medicines.   I have refilled the HCTZ today    Testing/Procedures To Be Arranged:  N/A  Follow-Up:   See me in one year.     Other Special Instructions:   N/A    If you need a refill on your cardiac medications before your next appointment, please call your pharmacy.   Call the [place] office at [phone] if you have any questions, problems or concerns.

## 2015-10-21 ENCOUNTER — Other Ambulatory Visit: Payer: Self-pay | Admitting: *Deleted

## 2015-10-21 MED ORDER — RAMIPRIL 10 MG PO CAPS: 10.0000 mg | ORAL_CAPSULE | Freq: Every day | ORAL | 3 refills | Status: DC

## 2015-10-21 MED ORDER — HYDROCHLOROTHIAZIDE 25 MG PO TABS: 25.0000 mg | ORAL_TABLET | Freq: Every day | ORAL | 3 refills | Status: DC

## 2015-10-21 MED ORDER — CARVEDILOL 12.5 MG PO TABS: 12.5000 mg | ORAL_TABLET | Freq: Two times a day (BID) | ORAL | 3 refills | Status: DC

## 2015-11-04 ENCOUNTER — Encounter: Payer: Self-pay | Admitting: *Deleted

## 2015-11-29 ENCOUNTER — Other Ambulatory Visit: Payer: Self-pay | Admitting: *Deleted

## 2015-11-29 DIAGNOSIS — C50012 Malignant neoplasm of nipple and areola, left female breast: Principal | ICD-10-CM

## 2015-11-29 DIAGNOSIS — C50011 Malignant neoplasm of nipple and areola, right female breast: Secondary | ICD-10-CM

## 2015-11-30 ENCOUNTER — Other Ambulatory Visit (HOSPITAL_BASED_OUTPATIENT_CLINIC_OR_DEPARTMENT_OTHER): Payer: Medicare Other

## 2015-11-30 DIAGNOSIS — C50011 Malignant neoplasm of nipple and areola, right female breast: Secondary | ICD-10-CM

## 2015-11-30 DIAGNOSIS — Z853 Personal history of malignant neoplasm of breast: Secondary | ICD-10-CM | POA: Diagnosis not present

## 2015-11-30 DIAGNOSIS — C50012 Malignant neoplasm of nipple and areola, left female breast: Principal | ICD-10-CM

## 2015-11-30 LAB — CBC WITH DIFFERENTIAL/PLATELET
BASO%: 0.8 % (ref 0.0–2.0)
Basophils Absolute: 0.1 10*3/uL (ref 0.0–0.1)
EOS%: 3.9 % (ref 0.0–7.0)
Eosinophils Absolute: 0.3 10*3/uL (ref 0.0–0.5)
HCT: 40.3 % (ref 34.8–46.6)
HGB: 13.4 g/dL (ref 11.6–15.9)
LYMPH%: 30.8 % (ref 14.0–49.7)
MCH: 28.7 pg (ref 25.1–34.0)
MCHC: 33.2 g/dL (ref 31.5–36.0)
MCV: 86.4 fL (ref 79.5–101.0)
MONO#: 0.6 10*3/uL (ref 0.1–0.9)
MONO%: 7.4 % (ref 0.0–14.0)
NEUT#: 4.8 10*3/uL (ref 1.5–6.5)
NEUT%: 57.1 % (ref 38.4–76.8)
Platelets: 413 10*3/uL — ABNORMAL HIGH (ref 145–400)
RBC: 4.67 10*6/uL (ref 3.70–5.45)
RDW: 13.7 % (ref 11.2–14.5)
WBC: 8.5 10*3/uL (ref 3.9–10.3)
lymph#: 2.6 10*3/uL (ref 0.9–3.3)

## 2015-11-30 LAB — COMPREHENSIVE METABOLIC PANEL
ALT: 27 U/L (ref 0–55)
AST: 25 U/L (ref 5–34)
Albumin: 3.8 g/dL (ref 3.5–5.0)
Alkaline Phosphatase: 83 U/L (ref 40–150)
Anion Gap: 10 mEq/L (ref 3–11)
BUN: 13.1 mg/dL (ref 7.0–26.0)
CO2: 27 mEq/L (ref 22–29)
Calcium: 10 mg/dL (ref 8.4–10.4)
Chloride: 102 mEq/L (ref 98–109)
Creatinine: 0.7 mg/dL (ref 0.6–1.1)
EGFR: 85 mL/min/{1.73_m2} — ABNORMAL LOW (ref >90)
Glucose: 124 mg/dl (ref 70–140)
Potassium: 4.6 mEq/L (ref 3.5–5.1)
Sodium: 139 mEq/L (ref 136–145)
Total Bilirubin: 0.6 mg/dL (ref 0.20–1.20)
Total Protein: 7.4 g/dL (ref 6.4–8.3)

## 2015-11-30 LAB — DRAW EXTRA CLOT TUBE

## 2015-12-21 ENCOUNTER — Other Ambulatory Visit: Payer: Medicare Other

## 2015-12-27 ENCOUNTER — Other Ambulatory Visit: Payer: Self-pay | Admitting: Nurse Practitioner

## 2015-12-27 MED ORDER — RAMIPRIL 10 MG PO CAPS: 10.0000 mg | ORAL_CAPSULE | Freq: Every day | ORAL | 0 refills | Status: DC

## 2015-12-27 MED ORDER — CARVEDILOL 12.5 MG PO TABS: 12.5000 mg | ORAL_TABLET | Freq: Two times a day (BID) | ORAL | 0 refills | Status: DC

## 2015-12-28 ENCOUNTER — Ambulatory Visit: Payer: Medicare Other | Admitting: Oncology

## 2016-01-04 ENCOUNTER — Ambulatory Visit (HOSPITAL_BASED_OUTPATIENT_CLINIC_OR_DEPARTMENT_OTHER): Payer: Medicare Other | Admitting: Oncology

## 2016-01-04 VITALS — BP 151/80 | HR 65 | Temp 97.7°F | Resp 18 | Ht 62.0 in | Wt 131.7 lb

## 2016-01-04 DIAGNOSIS — C4359 Malignant melanoma of other part of trunk: Secondary | ICD-10-CM | POA: Diagnosis not present

## 2016-01-04 DIAGNOSIS — Z853 Personal history of malignant neoplasm of breast: Secondary | ICD-10-CM

## 2016-01-04 DIAGNOSIS — D0511 Intraductal carcinoma in situ of right breast: Secondary | ICD-10-CM

## 2016-01-04 DIAGNOSIS — Z86000 Personal history of in-situ neoplasm of breast: Secondary | ICD-10-CM

## 2016-01-04 DIAGNOSIS — M8580 Other specified disorders of bone density and structure, unspecified site: Secondary | ICD-10-CM

## 2016-01-04 NOTE — Progress Notes (Signed)
ID: Lawson Radar Meeker OB: Dec 25, 1945  MR#: [id number]  EXH#:[id number]  PCP: Gerrit Heck, MD GYN:  Bobbye Charleston, MD SU:  OTHER MD: Sydnee Levans, MD (FAX # [phone]); Everardo All, MD;  Berle Mull, MD ([address], [address], [place], [place] [address])   CHIEF COMPLAINTS:  1)  Hx Left Breast Cancer 1995      2) Hx Right Breast Cancer 2007                 3)  Hx Melanoma, Stage IIIA   HISTORY OF PRESENT ILLNESS: From the earlier summary:  Alexxis's history of breast cancer dates back to June of 1995, when she had left modified radical mastectomy under [facility] for a pT1c pN1, stage IIA invasive ductal carcinoma, grade 2, estrogen receptor 97% positive, progesterone receptor 45% positive, treated according to CALGB 9394, sequential arm (doxorubicin x3, fourth dose apparently omitted, followed by high-dose cyclophosphamide x3), followed by tamoxifen for 5 years. There has been no evidence of disease recurrence.  Further, in October of 2007 she underwent right lumpectomy for a ductal carcinoma in situ, high-grade, estrogen and progesterone receptor negative, followed by adjuvant radiation.  In July of 2013 the patient was noted to have an irregular mole in her left upper back. It is not clear to me whether this might be related to her prior left breast irradiation. Shave biopsy of this area 09/15/2011 by Dr. Derrel Nip [phone]) showed a superficial spreading malignant melanoma, with Breslow depth 1.45 mm, Clark's level IV. There was brisk host response and 8 mitoses per high power field were noted. There was focal regression but no definitive ulceration. There was no satellitosis. There was no vascular and urgent. Margins were positive, but cleared by wide excision under Dr Stark Klein 10/10/2011. There was some residual malignant melanoma in situ but margins to 2 cm were obtained, with advancement flap closure of the skin defect. In addition left axillary sentinel  lymph node mapping was performed showing metastatic melanoma in the single lymph node removed.  Noela sought a second opinion at [facility] under Dr Joaquim Lai Collichio. There was a full discussion regarding completion nodal resection, use of adjuvant interferon, and BRAF testing with a view to participation in a research protocol then available. The patient considered all these options and after much discussion the decision was made not to pursue full nodal dissection, partly because the area in question had a ready undergone surgery and radiation. The patient opted against interferon adjuvant therapy because of its very marginal benefit and significant side effects. She declined consideration of a research study and BRAF testing was not performed.  Her subsequent history is as detailed below.  INTERVAL HISTORY: Norelle returns today for follow-up of her noninvasive breast cancer and remote invasive breast cancer.. She is being followed with observation alone. She also has a history of melanoma, followed at [facility] by Dr Collichio.  REVIEW OF SYSTEMS: The interval history is generally benign. Verdella has been vacationing in [place] and a cruise trip that took her through the [place] can. She exercises chiefly by walking and also goes to the gym on a regular basis. She has an irregular heartbeat followed by cardiology but this is well-controlled on medication. A detailed review of systems today was otherwise stable  PAST MEDICAL HISTORY: Past Medical History:  Diagnosis Date  . Breast cancer ([place]) 1995 and 2007  . Cardiomyopathy secondary to chemotherapy [facility])    has improved   . Hyperlipidemia   .  Hypertension    under control, has been on med. x 3 yrs.  . Melanoma in situ of back ([place]) 09/2011   left  . Skin cancer 2013   Melanoma    PAST SURGICAL HISTORY: Past Surgical History:  Procedure Laterality Date  . BREAST LUMPECTOMY  12/21/2005   right  . MASTECTOMY  1995   with breast  reconstruction - left    FAMILY HISTORY Family History  Problem Relation Age of Onset  . Lung cancer Father   . Cancer Father     lung  . Autism Son    the patient's father died from complications of lung cancer at the age of 14. He was a heavy smoker. The patient's mother died at the age of 83. Kenli had no siblings. She underwent genetic testing for breast and ovarian cancer panel April of 2014. There were no demonstrable mutations in the BRCA or the other genes in the panel  GYNECOLOGIC HISTORY:  Menarche age 70, first live birth age 70. She is GX P2. She entered menopause at age 70, when she received her chemotherapy. She did not use hormone replacement. She did use birth control remotely, for approximately 14 years, with no complications.  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 Itzia's son [name] is handicapped, and works as an Training and development officer. [name] is Brewing technologist limited. Oceane has 3 grandchildren and Drumright 2. Harlynn grew up in an Database administrator denomination  ADVANCED DIRECTIVES: In place   HEALTH MAINTENANCE: (Updated 06/03/2013) Social History  Substance Use Topics  . Smoking status: Former Smoker    Quit date: 02/14/1964  . Smokeless tobacco: Never Used  . Alcohol use 0.0 oz/week     Comment: daily glass of wine     Colonoscopy: 2011  PAP: November 2014  Bone density: January 2013, osteopenia  Lipid panel:   August 2013/Dr. Barnes   No Known Allergies  Current Outpatient Prescriptions  Medication Sig Dispense Refill  . aspirin 325 MG tablet Take 325 mg by mouth daily. 1/2 tablet daily    . atorvastatin (LIPITOR) 40 MG tablet Take 40 mg by mouth daily.    . carvedilol (COREG) 12.5 MG tablet Take 1  tablet (12.5 mg total) by mouth 2 (two) times daily. 20 tablet 0  . cholecalciferol (VITAMIN D) 1000 UNITS tablet Take 2,000 Units by mouth daily.     . hydrochlorothiazide (HYDRODIURIL) 25 MG tablet Take 1 tablet (25 mg total) by mouth daily. 90 tablet 3  . metFORMIN (GLUCOPHAGE) 500 MG tablet 250 mg twice a day.    . ramipril (ALTACE) 10 MG capsule Take 1 capsule (10 mg total) by mouth daily. 10 capsule 0   No current facility-administered medications for this visit.     OBJECTIVE: And least white woman In no acute distress  Vitals:   01/04/16 1225  BP: (!) 151/80  Pulse: 65  Resp: 18  Temp: 97.7 F (36.5 C)     Body mass index is 24.09 kg/m.    ECOG FS:0 - Asymptomatic Filed Weights   01/04/16 1225  Weight: 131 lb 11.2 oz (59.7 kg)   Sclerae unicteric, EOMs intact Oropharynx clear and moist No cervical or supraclavicular adenopathy Lungs no rales or rhonchi Heart regular rate  and rhythm Abd soft, nontender, positive bowel sounds MSK no focal spinal tenderness, no upper extremity lymphedema Neuro: nonfocal, well oriented, appropriate affect Breasts: The right breast is status post lumpectomy with no evidence of disease recurrence. The left breast is status post mastectomy with no evidence of chest wall recurrence. The left axilla is benign   LAB RESULTS:   Lab Results  Component Value Date   WBC 8.5 11/30/2015   NEUTROABS 4.8 11/30/2015   HGB 13.4 11/30/2015   HCT 40.3 11/30/2015   MCV 86.4 11/30/2015   PLT 413 (H) 11/30/2015      Chemistry      Component Value Date/Time   NA 139 11/30/2015 1225   K 4.6 11/30/2015 1225   CL 99 04/23/2012 1418   CL 104 01/18/2012 0948   CO2 27 11/30/2015 1225   BUN 13.1 11/30/2015 1225   CREATININE 0.7 11/30/2015 1225      Component Value Date/Time   CALCIUM 10.0 11/30/2015 1225   ALKPHOS 83 11/30/2015 1225   AST 25 11/30/2015 1225   ALT 27 11/30/2015 1225   BILITOT 0.60 11/30/2015 1225      STUDIES: CLINICAL  DATA:  Screening.  EXAM: 2D DIGITAL SCREENING UNILATERAL RIGHT MAMMOGRAM WITH CAD AND ADJUNCT TOMO  COMPARISON:  Previous exam(s).  ACR Breast Density Category b: There are scattered areas of fibroglandular density.  FINDINGS: The patient has had a left mastectomy. There are no findings suspicious for malignancy. Images were processed with CAD.  IMPRESSION: No mammographic evidence of malignancy. A result letter of this screening mammogram will be mailed directly to the patient.  RECOMMENDATION: Screening mammogram in one year.  (Code:SM-R-63M)  BI-RADS CATEGORY  1: Negative.   Electronically Signed   By: Nolon Nations M.D.   On: 09/03/2015 11:06   ASSESSMENT: 70 y.o. BRCA negative [place] woman   (1) status post left mastectomy with TRAM reconstruction June of 1995 for a T1c N1, stage IIA invasive ductal carcinoma, grade 2, estrogen receptor 97% positive, progesterone receptor 45% positive, treated adjuvantly according to CALGB 9394 with 3 cycles of doxorubicin followed by 3 cycles of cyclophosphamide, then tamoxifen for 5 years, off therapy completed November of 2000  (2) status post right lumpectomy October 2007 for high-grade ductal carcinoma in situ, with negative margins estrogen and progesterone receptor negative, followed by adjuvant radiation therapy   (3) malignant melanoma, T2a N1a = stage IIIA, as follows:  (a) status post shave biopsy from the right upper back 09/15/2011 for a superficial spreading melanoma, Breslow depth 1.45 mm, Clark's level IV, with focal regression but no ulceration.  (b) status post wide excision and sentinel lymph node sampling 10/10/2011 with residual malignant melanoma in situ, but negative margins; the single sentinel lymph node was involved by melanoma with the largest subcapsular deposit measuring 0.22 mm, no evidence of capsular involvement or extracapsular extension  (c) completion nodal dissection was discussed, but  not performed in part because the patient had already had a complete left axillary lymph node dissection at the time of her 1995 left mastectomy (10 lymph nodes were removed at that time)  (d) adjuvant interferon was discussed with the patient when she visited [facility] in September 2013, but given that it side effects and marginal benefits the patient declined  (e) BRAF testing has not been done on the original tumor; this was also discussed with the patient at the time of her [facility] visit, as it might possibly lead to participation in a research protocol; the  patient decided not to pursue that option  (4) PET scan shows nonspecific uptake only at T6.   (a) MRI of the thoracic spine November 2015 showed no evidence of metastatic disease  (b) repeat PET scan 12/02/2014 shows no residual or recurrent hypermetabolic tumor.  PLAN: Arcenia is now 10 years out from definitive surgery for her ductal carcinoma in situ, with no evidence of disease recurrence. This is very favorable.  She is 4 years out from definitive surgery for her melanoma, again with no evidence of disease recurrence. This is followed at [facility], and per an CCN guidelines we are doing no scans in the absence of specific symptoms to evaluate.  At this point I feel comfortable releasing her to her primary care physician. All she will need as far as breast cancer is concerned is a yearly right mammogram and a yearly physician breast and chest wall exam.  I will be glad to see Rylynn at any point in the future if on when the need arises, but as of now we are making no further routine appointment for her here.   Chauncey Cruel, MD   01/09/2016 8:57 AM

## 2016-01-10 DIAGNOSIS — H524 Presbyopia: Secondary | ICD-10-CM | POA: Diagnosis not present

## 2016-01-27 ENCOUNTER — Other Ambulatory Visit: Payer: Self-pay | Admitting: Obstetrics and Gynecology

## 2016-01-27 DIAGNOSIS — Z124 Encounter for screening for malignant neoplasm of cervix: Secondary | ICD-10-CM | POA: Diagnosis not present

## 2016-01-27 DIAGNOSIS — Z01419 Encounter for gynecological examination (general) (routine) without abnormal findings: Secondary | ICD-10-CM | POA: Diagnosis not present

## 2016-01-28 LAB — CYTOLOGY - PAP

## 2016-02-18 DIAGNOSIS — Z85820 Personal history of malignant melanoma of skin: Secondary | ICD-10-CM | POA: Diagnosis not present

## 2016-02-18 DIAGNOSIS — Z853 Personal history of malignant neoplasm of breast: Secondary | ICD-10-CM | POA: Diagnosis not present

## 2016-02-18 DIAGNOSIS — E7800 Pure hypercholesterolemia, unspecified: Secondary | ICD-10-CM | POA: Diagnosis not present

## 2016-02-18 DIAGNOSIS — I429 Cardiomyopathy, unspecified: Secondary | ICD-10-CM | POA: Diagnosis not present

## 2016-02-18 DIAGNOSIS — E119 Type 2 diabetes mellitus without complications: Secondary | ICD-10-CM | POA: Diagnosis not present

## 2016-02-18 DIAGNOSIS — Z7984 Long term (current) use of oral hypoglycemic drugs: Secondary | ICD-10-CM | POA: Diagnosis not present

## 2016-03-24 DIAGNOSIS — N390 Urinary tract infection, site not specified: Secondary | ICD-10-CM | POA: Diagnosis not present

## 2016-03-24 DIAGNOSIS — R350 Frequency of micturition: Secondary | ICD-10-CM | POA: Diagnosis not present

## 2016-04-11 DIAGNOSIS — Z85820 Personal history of malignant melanoma of skin: Secondary | ICD-10-CM | POA: Diagnosis not present

## 2016-04-11 DIAGNOSIS — L821 Other seborrheic keratosis: Secondary | ICD-10-CM | POA: Diagnosis not present

## 2016-04-11 DIAGNOSIS — L905 Scar conditions and fibrosis of skin: Secondary | ICD-10-CM | POA: Diagnosis not present

## 2016-04-11 DIAGNOSIS — D225 Melanocytic nevi of trunk: Secondary | ICD-10-CM | POA: Diagnosis not present

## 2016-04-11 DIAGNOSIS — L814 Other melanin hyperpigmentation: Secondary | ICD-10-CM | POA: Diagnosis not present

## 2016-06-07 DIAGNOSIS — H1045 Other chronic allergic conjunctivitis: Secondary | ICD-10-CM | POA: Diagnosis not present

## 2016-06-07 DIAGNOSIS — H538 Other visual disturbances: Secondary | ICD-10-CM | POA: Diagnosis not present

## 2016-06-07 DIAGNOSIS — H2513 Age-related nuclear cataract, bilateral: Secondary | ICD-10-CM | POA: Diagnosis not present

## 2016-07-24 ENCOUNTER — Other Ambulatory Visit: Payer: Self-pay | Admitting: Oncology

## 2016-07-24 DIAGNOSIS — Z1231 Encounter for screening mammogram for malignant neoplasm of breast: Secondary | ICD-10-CM

## 2016-09-21 ENCOUNTER — Ambulatory Visit
Admission: RE | Admit: 2016-09-21 | Discharge: 2016-09-21 | Disposition: A | Discharge status: Home / Self Care | Payer: Medicare Other | Source: Ambulatory Visit | Attending: Oncology | Admitting: Oncology

## 2016-09-21 DIAGNOSIS — Z1231 Encounter for screening mammogram for malignant neoplasm of breast: Secondary | ICD-10-CM

## 2016-09-21 HISTORY — DX: Personal history of irradiation: Z92.3

## 2016-09-21 HISTORY — DX: Personal history of antineoplastic chemotherapy: Z92.21

## 2016-09-27 ENCOUNTER — Encounter: Payer: Self-pay | Admitting: Nurse Practitioner

## 2016-10-02 ENCOUNTER — Other Ambulatory Visit: Payer: Self-pay | Admitting: *Deleted

## 2016-10-02 MED ORDER — CARVEDILOL 12.5 MG PO TABS: 12.5000 mg | ORAL_TABLET | Freq: Two times a day (BID) | ORAL | 0 refills | Status: DC

## 2016-10-02 MED ORDER — RAMIPRIL 10 MG PO CAPS: 10.0000 mg | ORAL_CAPSULE | Freq: Every day | ORAL | 0 refills | Status: DC

## 2016-10-02 MED ORDER — HYDROCHLOROTHIAZIDE 25 MG PO TABS: 25.0000 mg | ORAL_TABLET | Freq: Every day | ORAL | 0 refills | Status: DC

## 2016-10-04 ENCOUNTER — Ambulatory Visit: Payer: Medicare Other | Admitting: Nurse Practitioner

## 2016-10-11 ENCOUNTER — Ambulatory Visit: Payer: Medicare Other | Admitting: Nurse Practitioner

## 2016-10-18 ENCOUNTER — Ambulatory Visit (INDEPENDENT_AMBULATORY_CARE_PROVIDER_SITE_OTHER): Payer: Medicare Other | Admitting: Nurse Practitioner

## 2016-10-18 ENCOUNTER — Encounter: Payer: Self-pay | Admitting: Nurse Practitioner

## 2016-10-18 VITALS — BP 150/90 | HR 71 | Ht 62.0 in | Wt 129.0 lb

## 2016-10-18 DIAGNOSIS — I428 Other cardiomyopathies: Secondary | ICD-10-CM | POA: Diagnosis not present

## 2016-10-18 MED ORDER — ASPIRIN EC 81 MG PO TBEC: 81.0000 mg | DELAYED_RELEASE_TABLET | Freq: Every day | ORAL | Status: DC

## 2016-10-18 NOTE — Progress Notes (Signed)
CARDIOLOGY OFFICE NOTE  Date:  10/18/2016    Tanya Mitchell Date of Birth: Dec 04, 1945 Medical Record #[id number]  PCP:  Leighton Ruff, MD  Cardiologist:  Servando Snare     Chief Complaint  Patient presents with  . Cardiomyopathy    1 year check     History of Present Illness: Tanya Mitchell is a 71 y.o. female who presents today for a one year check. She is a former patient of Dr. Claris Gladden as well as Dr. Susa Simmonds. Primarily follows with me.   She has had a prior cardiomyopathy secondary to chemotherapy for breast cancer in the remote past. Her EF recovered. Last echo was in May of 2014. Other issues include melanoma - now seeing Dr. Jana Hakim and Dr. Harriet Masson at [facility] for her melanoma.Labs are checked by PCP. She has HLD and DM as well.   I last saw her a year ago - she was doing well.   She comes in today. She is here alone. She is doing very well. No chest pain. Breathing is fine. Weight is down a few pounds - eating less carbs and husband has been actively losing weight so she has lost a little. Not dizzy. Tolerating her medicines. BP is great at home. She is very happy with how she is doing. Step son has died from lung cancer. 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 Labs are being checked by PCP.   Past Medical History:  Diagnosis Date  . Breast cancer ([place]) 1995 and 2007  . Cardiomyopathy secondary to chemotherapy [facility])    has improved   . Hyperlipidemia   . Hypertension    under control, has been on med. x 3 yrs.  . Melanoma in situ of back ([place]) 09/2011   left  . Personal history of chemotherapy   . Personal history of radiation therapy   . Skin cancer 2013   Melanoma    Past Surgical History:  Procedure Laterality Date  . BREAST LUMPECTOMY  12/21/2005   right  . MASTECTOMY  1995   with breast reconstruction - left     Medications: Current Meds  Medication Sig  . atorvastatin (LIPITOR) 40 MG tablet Take 40 mg by mouth daily.  . carvedilol  (COREG) 12.5 MG tablet Take 1 tablet (12.5 mg total) by mouth 2 (two) times daily.  . cholecalciferol (VITAMIN D) 1000 UNITS tablet Take 2,000 Units by mouth daily.   . hydrochlorothiazide (HYDRODIURIL) 25 MG tablet Take 1 tablet (25 mg total) by mouth daily.  . metFORMIN (GLUCOPHAGE) 500 MG tablet 250 mg twice a day.  . ramipril (ALTACE) 10 MG capsule Take 1 capsule (10 mg total) by mouth daily.  . [DISCONTINUED] aspirin 325 MG tablet Take 325 mg by mouth daily. 1/2 tablet daily     Allergies: No Known Allergies  Social History: The patient  reports that she quit smoking about 52 years ago. She has never used smokeless tobacco. She reports that she drinks alcohol. She reports that she does not use drugs.   Family History: The patient's family history includes Autism in her son; Breast cancer in her daughter; Cancer in her father; Lung cancer in her father.   Review of Systems: Please see the history of present illness.   Otherwise, the review of systems is positive for none.   All other systems are reviewed and negative.   Physical Exam: VS:  BP (!) 150/90 (BP Location: Left Arm, Patient Position: Sitting, Cuff Size: Normal)  Pulse 71   Ht 5\' 2"  (1.575 m)   Wt 129 lb (58.5 kg)   BMI 23.59 kg/m  .  BMI Body mass index is 23.59 kg/m.  Wt Readings from Last 3 Encounters:  10/18/16 129 lb (58.5 kg)  01/04/16 131 lb 11.2 oz (59.7 kg)  10/13/15 131 lb 1.9 oz (59.5 kg)   BP is 122/80 by me.   General: Pleasant. Well developed, well nourished and in no acute distress.   HEENT: Normal.  Neck: Supple, no JVD, carotid bruits, or masses noted.  Cardiac: Regular rate and rhythm. No murmurs, rubs, or gallops. No edema.  Respiratory:  Lungs are clear to auscultation bilaterally with normal work of breathing.  GI: Soft and nontender.  MS: No deformity or atrophy. Gait and ROM intact.  Skin: Warm and dry. Color is normal.  Neuro:  Strength and sensation are intact and no gross focal  deficits noted.  Psych: Alert, appropriate and with normal affect.   LABORATORY DATA:  EKG:  EKG is ordered today. This demonstrates NSR with 2 PVCs.  Lab Results  Component Value Date   WBC 8.5 11/30/2015   HGB 13.4 11/30/2015   HCT 40.3 11/30/2015   PLT 413 (H) 11/30/2015   GLUCOSE 124 11/30/2015   ALT 27 11/30/2015   AST 25 11/30/2015   NA 139 11/30/2015   K 4.6 11/30/2015   CL 99 04/23/2012   CREATININE 0.7 11/30/2015   BUN 13.1 11/30/2015   CO2 27 11/30/2015       BNP (last 3 results) No results for input(s): BNP in the last 8760 hours.  ProBNP (last 3 results) No results for input(s): PROBNP in the last 8760 hours.   Other Studies Reviewed Today:  Echo Study Conclusions from 2014  - Left ventricle: The cavity size was normal. Wall thickness was normal. The estimated ejection fraction was 55%. - Mitral valve: Mild regurgitation. - Atrial septum: No defect or patent foramen ovale was identified.  Assessment/Plan: 1. History of CM - last echo was in 2014 - EF normal. She is doing great clinically. No symptoms noted. No change in her current regimen. I will see her back in a year.  2. Melanoma - followed by oncology at [facility]  3. HLD -  She has her labs with her PCP  4. HTN - she has good reported outpatient control. No changes made today.   Current medicines are reviewed with the patient today.  The patient does not have concerns regarding medicines other than what has been noted above.  The following changes have been made:  See above.  Labs/ tests ordered today include:    Orders Placed This Encounter  Procedures  . EKG 12-Lead     Disposition:   FU with me in 1 year.   Patient is agreeable to this plan and will call if any problems develop in the interim.   SignedTruitt Merle, NP  10/18/2016 3:31 PM  [place] [address] [place], [place]  [address] Phone: [phone] Fax: [phone]

## 2016-10-18 NOTE — Patient Instructions (Addendum)
We will be checking the following labs today - NONE   Medication Instructions:    Continue with your current medicines.   Ok to use low dose baby aspirin daily    Testing/Procedures To Be Arranged:  N/A  Follow-Up:   See me in one year.     Other Special Instructions:   N/A    If you need a refill on your cardiac medications before your next appointment, please call your pharmacy.   Call the [place] office at [phone] if you have any questions, problems or concerns.

## 2017-01-10 DIAGNOSIS — H524 Presbyopia: Secondary | ICD-10-CM | POA: Diagnosis not present

## 2017-01-11 ENCOUNTER — Other Ambulatory Visit: Payer: Self-pay | Admitting: *Deleted

## 2017-01-11 MED ORDER — CARVEDILOL 12.5 MG PO TABS: 12.5000 mg | ORAL_TABLET | Freq: Two times a day (BID) | ORAL | 2 refills | Status: DC

## 2017-01-11 MED ORDER — HYDROCHLOROTHIAZIDE 25 MG PO TABS: 25.0000 mg | ORAL_TABLET | Freq: Every day | ORAL | 2 refills | Status: DC

## 2017-01-11 MED ORDER — RAMIPRIL 10 MG PO CAPS: 10.0000 mg | ORAL_CAPSULE | Freq: Every day | ORAL | 2 refills | Status: DC

## 2017-01-29 DIAGNOSIS — E7800 Pure hypercholesterolemia, unspecified: Secondary | ICD-10-CM | POA: Diagnosis not present

## 2017-01-29 DIAGNOSIS — E119 Type 2 diabetes mellitus without complications: Secondary | ICD-10-CM | POA: Diagnosis not present

## 2017-01-29 DIAGNOSIS — Z1389 Encounter for screening for other disorder: Secondary | ICD-10-CM | POA: Diagnosis not present

## 2017-01-29 DIAGNOSIS — Z0000 Encounter for general adult medical examination without abnormal findings: Secondary | ICD-10-CM | POA: Diagnosis not present

## 2017-04-17 DIAGNOSIS — D2262 Melanocytic nevi of left upper limb, including shoulder: Secondary | ICD-10-CM | POA: Diagnosis not present

## 2017-04-17 DIAGNOSIS — L821 Other seborrheic keratosis: Secondary | ICD-10-CM | POA: Diagnosis not present

## 2017-04-17 DIAGNOSIS — D2261 Melanocytic nevi of right upper limb, including shoulder: Secondary | ICD-10-CM | POA: Diagnosis not present

## 2017-04-17 DIAGNOSIS — Z85820 Personal history of malignant melanoma of skin: Secondary | ICD-10-CM | POA: Diagnosis not present

## 2017-05-31 DIAGNOSIS — Z7984 Long term (current) use of oral hypoglycemic drugs: Secondary | ICD-10-CM | POA: Diagnosis not present

## 2017-05-31 DIAGNOSIS — E7800 Pure hypercholesterolemia, unspecified: Secondary | ICD-10-CM | POA: Diagnosis not present

## 2017-05-31 DIAGNOSIS — E119 Type 2 diabetes mellitus without complications: Secondary | ICD-10-CM | POA: Diagnosis not present

## 2017-06-06 DIAGNOSIS — T1512XA Foreign body in conjunctival sac, left eye, initial encounter: Secondary | ICD-10-CM | POA: Diagnosis not present

## 2017-06-18 DIAGNOSIS — K432 Incisional hernia without obstruction or gangrene: Secondary | ICD-10-CM | POA: Diagnosis not present

## 2017-07-03 DIAGNOSIS — Z853 Personal history of malignant neoplasm of breast: Secondary | ICD-10-CM | POA: Diagnosis not present

## 2017-07-03 DIAGNOSIS — R1011 Right upper quadrant pain: Secondary | ICD-10-CM | POA: Diagnosis not present

## 2017-07-03 DIAGNOSIS — Z9889 Other specified postprocedural states: Secondary | ICD-10-CM | POA: Diagnosis not present

## 2017-07-03 DIAGNOSIS — Z9012 Acquired absence of left breast and nipple: Secondary | ICD-10-CM | POA: Diagnosis not present

## 2017-07-16 DIAGNOSIS — H2511 Age-related nuclear cataract, right eye: Secondary | ICD-10-CM | POA: Diagnosis not present

## 2017-07-16 DIAGNOSIS — H538 Other visual disturbances: Secondary | ICD-10-CM | POA: Diagnosis not present

## 2017-07-16 DIAGNOSIS — H2512 Age-related nuclear cataract, left eye: Secondary | ICD-10-CM | POA: Diagnosis not present

## 2017-08-23 ENCOUNTER — Other Ambulatory Visit: Payer: Self-pay | Admitting: Nurse Practitioner

## 2017-08-23 NOTE — Telephone Encounter (Signed)
Prescribing Provider Encounter Provider  Burtis Junes, NP Juventino Slovak, CMA  Outpatient Medication Detail    Disp Refills Start End   hydrochlorothiazide (HYDRODIURIL) 25 MG tablet 90 tablet 2 01/11/2017    Sig - Route: Take 1 tablet (25 mg total) by mouth daily. - Oral   Sent to pharmacy as: hydrochlorothiazide (HYDRODIURIL) 25 MG tablet   E-Prescribing Status: Receipt confirmed by pharmacy (01/11/2017 2:37 PM EST)   Pharmacy   [facility] [place], [place]

## 2017-08-23 NOTE — Telephone Encounter (Signed)
Burtis Junes, NP Juventino Slovak, CMA  Outpatient Medication Detail    Disp Refills Start End   ramipril (ALTACE) 10 MG capsule 90 capsule 2 01/11/2017    Sig - Route: Take 1 capsule (10 mg total) by mouth daily. - Oral   Sent to pharmacy as: ramipril (ALTACE) 10 MG capsule   E-Prescribing Status: Receipt confirmed by pharmacy (01/11/2017 3:12 PM EST)   Pharmacy   [facility] [place], [place]

## 2017-09-03 ENCOUNTER — Other Ambulatory Visit: Payer: Self-pay | Admitting: Oncology

## 2017-09-03 DIAGNOSIS — Z1231 Encounter for screening mammogram for malignant neoplasm of breast: Secondary | ICD-10-CM

## 2017-09-26 ENCOUNTER — Ambulatory Visit
Admission: RE | Admit: 2017-09-26 | Discharge: 2017-09-26 | Disposition: A | Discharge status: Home / Self Care | Payer: Medicare Other | Source: Ambulatory Visit | Attending: Oncology | Admitting: Oncology

## 2017-09-26 DIAGNOSIS — Z1231 Encounter for screening mammogram for malignant neoplasm of breast: Secondary | ICD-10-CM

## 2017-10-09 DIAGNOSIS — H25013 Cortical age-related cataract, bilateral: Secondary | ICD-10-CM | POA: Diagnosis not present

## 2017-10-09 DIAGNOSIS — I10 Essential (primary) hypertension: Secondary | ICD-10-CM | POA: Diagnosis not present

## 2017-10-09 DIAGNOSIS — H2513 Age-related nuclear cataract, bilateral: Secondary | ICD-10-CM | POA: Diagnosis not present

## 2017-10-09 DIAGNOSIS — H18413 Arcus senilis, bilateral: Secondary | ICD-10-CM | POA: Diagnosis not present

## 2017-10-23 ENCOUNTER — Ambulatory Visit: Payer: Medicare Other | Admitting: Nurse Practitioner

## 2017-10-23 ENCOUNTER — Encounter: Payer: Self-pay | Admitting: Nurse Practitioner

## 2017-10-23 VITALS — BP 132/80 | HR 72 | Ht 62.0 in | Wt 126.4 lb

## 2017-10-23 DIAGNOSIS — R011 Cardiac murmur, unspecified: Secondary | ICD-10-CM | POA: Diagnosis not present

## 2017-10-23 DIAGNOSIS — I428 Other cardiomyopathies: Secondary | ICD-10-CM | POA: Diagnosis not present

## 2017-10-23 MED ORDER — CARVEDILOL 12.5 MG PO TABS: 12.5000 mg | ORAL_TABLET | Freq: Two times a day (BID) | ORAL | 3 refills | Status: DC

## 2017-10-23 NOTE — Patient Instructions (Signed)
We will be checking the following labs today - NONE   Medication Instructions:    Continue with your current medicines.     Testing/Procedures To Be Arranged:  Echocardiogram  Follow-Up:   See me in one year    Other Special Instructions:   N/A    If you need a refill on your cardiac medications before your next appointment, please call your pharmacy.   Call the [place] office at [phone] if you have any questions, problems or concerns.

## 2017-10-23 NOTE — Progress Notes (Addendum)
CARDIOLOGY OFFICE NOTE  Date:  10/23/2017    Tanya Mitchell Date of Birth: 1945-09-14 Medical Record #[id number]  PCP:  Leighton Ruff, MD  Cardiologist:  Servando Snare     Chief Complaint  Patient presents with  . Cardiomyopathy    1 year check     History of Present Illness: Tanya Mitchell is a 72 y.o. female who presents today for a 1 year check. She is a former patient of Dr. Claris Gladden as well as Dr. Susa Simmonds. Primarily follows with me.   She has had a prior cardiomyopathy secondary to chemotherapy for breast cancer in the remote past. Her EF recovered. Last echo was in May of 2014. Other issues include melanoma - now seeing Dr. Jana Hakim and Dr. Harriet Masson at [facility] for her melanoma.Labs are checked by PCP. She has HLD and DM as well.   I have followed her over the past several years - she has done well. Last echo in 2014. Has been released from oncology.   She comes in today. She is here alone. She continues to do well. Continues to volunteer and enjoying traveling. No chest pain. Breathing is good. No swelling. Weight is down a few pounds. Not dizzy or lightheaded. She is happy with how she is feeling.   Past Medical History:  Diagnosis Date  . Breast cancer ([place]) 1995 and 2007  . Cardiomyopathy secondary to chemotherapy [facility])    has improved   . Hyperlipidemia   . Hypertension    under control, has been on med. x 3 yrs.  . Melanoma in situ of back ([place]) 09/2011   left  . Personal history of chemotherapy   . Personal history of radiation therapy   . Skin cancer 2013   Melanoma    Past Surgical History:  Procedure Laterality Date  . BREAST LUMPECTOMY  12/21/2005   right  . MASTECTOMY  1995   with breast reconstruction - left     Medications: Current Meds  Medication Sig  . aspirin EC 81 MG tablet Take 1 tablet (81 mg total) by mouth daily.  [name] atorvastatin (LIPITOR) 40 MG tablet Take 40 mg by mouth daily.  . carvedilol (COREG) 12.5 MG tablet Take 1 tablet  (12.5 mg total) by mouth 2 (two) times daily.  . cholecalciferol (VITAMIN D) 1000 UNITS tablet Take 2,000 Units by mouth daily.   . hydrochlorothiazide (HYDRODIURIL) 25 MG tablet TAKE 1 TABLET BY MOUTH DAILY  . metFORMIN (GLUCOPHAGE) 500 MG tablet 250 mg twice a day.  . ramipril (ALTACE) 10 MG capsule TAKE ONE CAPSULE BY MOUTH DAILY  . [DISCONTINUED] carvedilol (COREG) 12.5 MG tablet Take 1 tablet (12.5 mg total) by mouth 2 (two) times daily.     Allergies: No Known Allergies  Social History: The patient  reports that she quit smoking about 53 years ago. She has never used smokeless tobacco. She reports that she drinks alcohol. She reports that she does not use drugs.   Family History: The patient's family history includes Autism in her son; Breast cancer in her daughter; Cancer in her father; Lung cancer in her father.   Review of Systems: Please see the history of present illness.   Otherwise, the review of systems is positive for none.   All other systems are reviewed and negative.   Physical Exam: VS:  BP 132/80 (BP Location: Left Arm, Patient Position: Sitting, Cuff Size: Normal)   Pulse 72   Ht 5\' 2"  (1.575 m)  Wt 126 lb 6.4 oz (57.3 kg)   BMI 23.12 kg/m  .  BMI Body mass index is 23.12 kg/m.  Wt Readings from Last 3 Encounters:  10/23/17 126 lb 6.4 oz (57.3 kg)  10/18/16 129 lb (58.5 kg)  01/04/16 131 lb 11.2 oz (59.7 kg)    General: Pleasant. Well developed, well nourished and in no acute distress.   HEENT: Normal.  Neck: Supple, no JVD, carotid bruits, or masses noted.  Cardiac: Regular rate and rhythm. Soft outflow murmur. No edema.  Respiratory:  Lungs are clear to auscultation bilaterally with normal work of breathing.  GI: Soft and nontender.  MS: No deformity or atrophy. Gait and ROM intact.  Skin: Warm and dry. Color is normal.  Neuro:  Strength and sensation are intact and no gross focal deficits noted.  Psych: Alert, appropriate and with normal  affect.   LABORATORY DATA:  EKG:  EKG is ordered today. This demonstrates NSR with RSR'.  Lab Results  Component Value Date   WBC 8.5 11/30/2015   HGB 13.4 11/30/2015   HCT 40.3 11/30/2015   PLT 413 (H) 11/30/2015   GLUCOSE 124 11/30/2015   ALT 27 11/30/2015   AST 25 11/30/2015   NA 139 11/30/2015   K 4.6 11/30/2015   CL 99 04/23/2012   CREATININE 0.7 11/30/2015   BUN 13.1 11/30/2015   CO2 27 11/30/2015       BNP (last 3 results) No results for input(s): BNP in the last 8760 hours.  ProBNP (last 3 results) No results for input(s): PROBNP in the last 8760 hours.   Other Studies Reviewed Today:  Echo Study Conclusions from 2014  - Left ventricle: The cavity size was normal. Wall thickness was normal. The estimated ejection fraction was 55%. - Mitral valve: Mild regurgitation. - Atrial septum: No defect or patent foramen ovale was identified.  Assessment/Plan: 1. History of CM - last echo was in 2014 - I would like to get her echo updated - it has been 5 years. Has murmur on exam as well. No symptoms of CHF.   2. Melanoma/breast cancer - she has done well  3. HLD - She has her labs with her PCP - noted from April - looks good.   4. HTN - BP is great - no changes made. Coreg refilled.    Current medicines are reviewed with the patient today.  The patient does not have concerns regarding medicines other than what has been noted above.  The following changes have been made:  See above.  Labs/ tests ordered today include:    Orders Placed This Encounter  Procedures  . EKG 12-Lead  . ECHOCARDIOGRAM COMPLETE     Disposition:   FU with me in 1 year.   Patient is agreeable to this plan and will call if any problems develop in the interim.   SignedTruitt Merle, NP  10/23/2017 3:06 PM  [facility] [address] [place], [place]  [address] Phone: [phone] Fax: [phone]

## 2017-10-31 ENCOUNTER — Other Ambulatory Visit: Payer: Self-pay

## 2017-10-31 ENCOUNTER — Ambulatory Visit (HOSPITAL_COMMUNITY): Payer: Medicare Other | Attending: Internal Medicine

## 2017-10-31 DIAGNOSIS — I428 Other cardiomyopathies: Secondary | ICD-10-CM | POA: Diagnosis not present

## 2017-10-31 DIAGNOSIS — I429 Cardiomyopathy, unspecified: Secondary | ICD-10-CM | POA: Insufficient documentation

## 2017-10-31 DIAGNOSIS — I10 Essential (primary) hypertension: Secondary | ICD-10-CM | POA: Insufficient documentation

## 2017-10-31 DIAGNOSIS — Z87891 Personal history of nicotine dependence: Secondary | ICD-10-CM | POA: Insufficient documentation

## 2017-10-31 DIAGNOSIS — E785 Hyperlipidemia, unspecified: Secondary | ICD-10-CM | POA: Diagnosis not present

## 2017-10-31 DIAGNOSIS — Z9012 Acquired absence of left breast and nipple: Secondary | ICD-10-CM | POA: Insufficient documentation

## 2017-10-31 DIAGNOSIS — Z853 Personal history of malignant neoplasm of breast: Secondary | ICD-10-CM | POA: Diagnosis not present

## 2017-10-31 DIAGNOSIS — E119 Type 2 diabetes mellitus without complications: Secondary | ICD-10-CM | POA: Diagnosis not present

## 2017-10-31 DIAGNOSIS — R011 Cardiac murmur, unspecified: Secondary | ICD-10-CM | POA: Diagnosis not present

## 2017-11-01 DIAGNOSIS — E119 Type 2 diabetes mellitus without complications: Secondary | ICD-10-CM | POA: Diagnosis not present

## 2017-11-01 DIAGNOSIS — E7800 Pure hypercholesterolemia, unspecified: Secondary | ICD-10-CM | POA: Diagnosis not present

## 2017-11-01 DIAGNOSIS — Z7189 Other specified counseling: Secondary | ICD-10-CM | POA: Diagnosis not present

## 2017-11-01 DIAGNOSIS — Z1211 Encounter for screening for malignant neoplasm of colon: Secondary | ICD-10-CM | POA: Diagnosis not present

## 2017-11-05 DIAGNOSIS — E119 Type 2 diabetes mellitus without complications: Secondary | ICD-10-CM | POA: Diagnosis not present

## 2017-11-05 DIAGNOSIS — E7800 Pure hypercholesterolemia, unspecified: Secondary | ICD-10-CM | POA: Diagnosis not present

## 2017-11-05 DIAGNOSIS — R195 Other fecal abnormalities: Secondary | ICD-10-CM | POA: Diagnosis not present

## 2017-11-06 DIAGNOSIS — R7989 Other specified abnormal findings of blood chemistry: Secondary | ICD-10-CM | POA: Diagnosis not present

## 2017-11-07 DIAGNOSIS — Z961 Presence of intraocular lens: Secondary | ICD-10-CM | POA: Diagnosis not present

## 2017-11-07 DIAGNOSIS — H2512 Age-related nuclear cataract, left eye: Secondary | ICD-10-CM | POA: Diagnosis not present

## 2017-11-07 DIAGNOSIS — H2511 Age-related nuclear cataract, right eye: Secondary | ICD-10-CM | POA: Diagnosis not present

## 2017-11-08 ENCOUNTER — Other Ambulatory Visit: Payer: Self-pay | Admitting: Nurse Practitioner

## 2017-11-16 DIAGNOSIS — R7989 Other specified abnormal findings of blood chemistry: Secondary | ICD-10-CM | POA: Diagnosis not present

## 2017-11-28 ENCOUNTER — Telehealth: Payer: Self-pay | Admitting: Oncology

## 2017-11-28 ENCOUNTER — Other Ambulatory Visit: Payer: Self-pay | Admitting: Oncology

## 2017-11-28 DIAGNOSIS — D75839 Thrombocytosis, unspecified: Secondary | ICD-10-CM | POA: Insufficient documentation

## 2017-11-28 DIAGNOSIS — D72829 Elevated white blood cell count, unspecified: Secondary | ICD-10-CM | POA: Insufficient documentation

## 2017-11-28 DIAGNOSIS — D473 Essential (hemorrhagic) thrombocythemia: Secondary | ICD-10-CM

## 2017-11-28 DIAGNOSIS — D72828 Other elevated white blood cell count: Secondary | ICD-10-CM

## 2017-11-28 NOTE — Telephone Encounter (Signed)
Appt scheduled LMVM for patient with date/time per 10/16 sch msg

## 2017-11-28 NOTE — Progress Notes (Signed)
I received a referral on Tanya Mitchell from her primary care physician because of elevated white cell count and platelet counts.  I left her a message on her phone.  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  Depending on results we will follow-up as needed.

## 2017-11-30 ENCOUNTER — Other Ambulatory Visit: Payer: Self-pay | Admitting: Oncology

## 2017-11-30 DIAGNOSIS — D473 Essential (hemorrhagic) thrombocythemia: Secondary | ICD-10-CM

## 2017-11-30 DIAGNOSIS — D75839 Thrombocytosis, unspecified: Secondary | ICD-10-CM

## 2017-11-30 NOTE — Progress Notes (Signed)
We received a note from Dr. Drema [place] [facility].  This shows persistent leukocytosis, with persistently elevated neutrophil count in the 8.5-11 range, and a persistently elevated platelet count in the 513 03/[birth year]/1940 range.  I have not been able to reach [facility] but I did leave her a voicemail stating that we are obtaining some lab on 12/04/2017.  She can drop and that day if she is volunteering, as she usually does on Tuesdays, to discuss this further.  We may need to obtain additional labs if these are all negative.  We may also need to proceed to bone marrow biopsy.

## 2017-12-04 ENCOUNTER — Inpatient Hospital Stay: Payer: Medicare Other | Attending: Family Medicine

## 2017-12-17 DIAGNOSIS — H2512 Age-related nuclear cataract, left eye: Secondary | ICD-10-CM | POA: Diagnosis not present

## 2017-12-18 ENCOUNTER — Inpatient Hospital Stay: Payer: Medicare Other | Attending: Family Medicine

## 2017-12-18 DIAGNOSIS — D72828 Other elevated white blood cell count: Secondary | ICD-10-CM

## 2017-12-18 DIAGNOSIS — C4359 Malignant melanoma of other part of trunk: Secondary | ICD-10-CM | POA: Diagnosis not present

## 2017-12-18 DIAGNOSIS — D473 Essential (hemorrhagic) thrombocythemia: Secondary | ICD-10-CM

## 2017-12-18 DIAGNOSIS — D0511 Intraductal carcinoma in situ of right breast: Secondary | ICD-10-CM

## 2017-12-18 DIAGNOSIS — D75839 Thrombocytosis, unspecified: Secondary | ICD-10-CM

## 2017-12-18 LAB — CBC WITH DIFFERENTIAL/PLATELET
Abs Immature Granulocytes: 0.08 10*3/uL — ABNORMAL HIGH (ref 0.00–0.07)
Basophils Absolute: 0.1 10*3/uL (ref 0.0–0.1)
Basophils Relative: 1 %
Eosinophils Absolute: 0.3 10*3/uL (ref 0.0–0.5)
Eosinophils Relative: 2 %
HCT: 40.4 % (ref 36.0–46.0)
Hemoglobin: 13.6 g/dL (ref 12.0–15.0)
Immature Granulocytes: 1 %
Lymphocytes Relative: 15 %
Lymphs Abs: 2.5 10*3/uL (ref 0.7–4.0)
MCH: 28.9 pg (ref 26.0–34.0)
MCHC: 33.7 g/dL (ref 30.0–36.0)
MCV: 85.8 fL (ref 80.0–100.0)
Monocytes Absolute: 0.4 10*3/uL (ref 0.1–1.0)
Monocytes Relative: 3 %
Neutro Abs: 13.1 10*3/uL — ABNORMAL HIGH (ref 1.7–7.7)
Neutrophils Relative %: 78 %
Platelets: 539 10*3/uL — ABNORMAL HIGH (ref 150–400)
RBC: 4.71 MIL/uL (ref 3.87–5.11)
RDW: 14.1 % (ref 11.5–15.5)
WBC: 16.5 10*3/uL — ABNORMAL HIGH (ref 4.0–10.5)
nRBC: 0 % (ref 0.0–0.2)

## 2017-12-18 LAB — SEDIMENTATION RATE: Sed Rate: 0 mm/hr (ref 0–22)

## 2017-12-18 LAB — SAVE SMEAR(SSMR), FOR PROVIDER SLIDE REVIEW

## 2017-12-18 LAB — FERRITIN: Ferritin: 93 ng/mL (ref 11–307)

## 2017-12-20 ENCOUNTER — Encounter: Payer: Self-pay | Admitting: Oncology

## 2017-12-20 ENCOUNTER — Other Ambulatory Visit: Payer: Self-pay | Admitting: *Deleted

## 2017-12-20 ENCOUNTER — Other Ambulatory Visit: Payer: Self-pay | Admitting: Oncology

## 2017-12-20 DIAGNOSIS — D72828 Other elevated white blood cell count: Secondary | ICD-10-CM

## 2017-12-20 DIAGNOSIS — D0511 Intraductal carcinoma in situ of right breast: Secondary | ICD-10-CM

## 2017-12-20 DIAGNOSIS — D473 Essential (hemorrhagic) thrombocythemia: Secondary | ICD-10-CM

## 2017-12-20 DIAGNOSIS — Z1211 Encounter for screening for malignant neoplasm of colon: Secondary | ICD-10-CM | POA: Diagnosis not present

## 2017-12-20 DIAGNOSIS — D75839 Thrombocytosis, unspecified: Secondary | ICD-10-CM

## 2017-12-20 DIAGNOSIS — I429 Cardiomyopathy, unspecified: Secondary | ICD-10-CM | POA: Diagnosis not present

## 2017-12-24 ENCOUNTER — Other Ambulatory Visit: Payer: Self-pay | Admitting: Oncology

## 2017-12-24 NOTE — Progress Notes (Signed)
Labs from 12/18/2017 on [place] showed a normal sed rate and normal hemoglobin, as well and is a normal ferritin at 93.  The white cell count was elevated at 16.5, with the majority of those being neutrophils, and the platelet count was elevated at 539.  Review of the blood film finds unremarkable red cells, confirms the moderate increase in platelets, and finds very minimal left shift in the white cells series, with a few bands noted.  There was no dysplasia, no detailed poikilocytes, no nucleated red cells  I have asked her to stop in when she is volunteering to see if she wishes to proceed with a formal work-up which would include a Jak 2, BCR ABL, and bone marrow biopsy, as well as possibly CA LR and MPL.

## 2018-01-02 ENCOUNTER — Other Ambulatory Visit: Payer: Self-pay | Admitting: Oncology

## 2018-01-02 LAB — BCR ABL1 FISH (GENPATH)

## 2018-01-02 LAB — JAK2 (INCLUDING V617F AND EXON 12), MPL,& CALR-NEXT GEN SEQ

## 2018-01-08 ENCOUNTER — Telehealth: Payer: Self-pay | Admitting: Oncology

## 2018-01-08 ENCOUNTER — Other Ambulatory Visit: Payer: Self-pay | Admitting: Oncology

## 2018-01-08 DIAGNOSIS — D72828 Other elevated white blood cell count: Secondary | ICD-10-CM

## 2018-01-08 DIAGNOSIS — D473 Essential (hemorrhagic) thrombocythemia: Secondary | ICD-10-CM

## 2018-01-08 DIAGNOSIS — D75839 Thrombocytosis, unspecified: Secondary | ICD-10-CM

## 2018-01-08 NOTE — Progress Notes (Unsigned)
I met with Tanya Mitchell today and reviewed all her results.  These do not suggest either an inflammatory, infectious, or primary myelo proliferative problem.  At this point I think all we need to do his follow-up and I am setting her up for repeat labs 01/22/2018.

## 2018-01-08 NOTE — Telephone Encounter (Signed)
Scheduled appt per 11/26 sch message - lab only - left message for pt with appt date and time.

## 2018-01-16 ENCOUNTER — Other Ambulatory Visit: Payer: Self-pay | Admitting: Oncology

## 2018-01-22 ENCOUNTER — Inpatient Hospital Stay: Payer: Medicare Other | Attending: Family Medicine

## 2018-01-22 DIAGNOSIS — C4359 Malignant melanoma of other part of trunk: Secondary | ICD-10-CM | POA: Diagnosis not present

## 2018-01-22 DIAGNOSIS — D473 Essential (hemorrhagic) thrombocythemia: Secondary | ICD-10-CM

## 2018-01-22 DIAGNOSIS — D72828 Other elevated white blood cell count: Secondary | ICD-10-CM

## 2018-01-22 DIAGNOSIS — D75839 Thrombocytosis, unspecified: Secondary | ICD-10-CM

## 2018-01-22 LAB — CBC WITH DIFFERENTIAL/PLATELET
Abs Immature Granulocytes: 0.06 10*3/uL (ref 0.00–0.07)
Basophils Absolute: 0.1 10*3/uL (ref 0.0–0.1)
Basophils Relative: 1 %
Eosinophils Absolute: 0.5 10*3/uL (ref 0.0–0.5)
Eosinophils Relative: 4 %
HCT: 39.2 % (ref 36.0–46.0)
Hemoglobin: 13.2 g/dL (ref 12.0–15.0)
Immature Granulocytes: 1 %
Lymphocytes Relative: 23 %
Lymphs Abs: 2.7 10*3/uL (ref 0.7–4.0)
MCH: 28.8 pg (ref 26.0–34.0)
MCHC: 33.7 g/dL (ref 30.0–36.0)
MCV: 85.6 fL (ref 80.0–100.0)
Monocytes Absolute: 0.4 10*3/uL (ref 0.1–1.0)
Monocytes Relative: 3 %
Neutro Abs: 8.1 10*3/uL — ABNORMAL HIGH (ref 1.7–7.7)
Neutrophils Relative %: 68 %
Platelets: 543 10*3/uL — ABNORMAL HIGH (ref 150–400)
RBC: 4.58 MIL/uL (ref 3.87–5.11)
RDW: 14 % (ref 11.5–15.5)
WBC: 11.8 10*3/uL — ABNORMAL HIGH (ref 4.0–10.5)
nRBC: 0 % (ref 0.0–0.2)

## 2018-01-22 LAB — SAVE SMEAR(SSMR), FOR PROVIDER SLIDE REVIEW

## 2018-01-29 ENCOUNTER — Telehealth: Payer: Self-pay | Admitting: Oncology

## 2018-01-29 NOTE — Telephone Encounter (Signed)
Scheduled appt per 12/17 schmessage - left message for patient with appt date and time and sent reminder letter in the mail.

## 2018-01-30 DIAGNOSIS — Z124 Encounter for screening for malignant neoplasm of cervix: Secondary | ICD-10-CM | POA: Diagnosis not present

## 2018-01-30 DIAGNOSIS — Z01419 Encounter for gynecological examination (general) (routine) without abnormal findings: Secondary | ICD-10-CM | POA: Diagnosis not present

## 2018-02-18 DIAGNOSIS — D72829 Elevated white blood cell count, unspecified: Secondary | ICD-10-CM | POA: Diagnosis not present

## 2018-02-18 DIAGNOSIS — Z0000 Encounter for general adult medical examination without abnormal findings: Secondary | ICD-10-CM | POA: Diagnosis not present

## 2018-02-18 DIAGNOSIS — E7800 Pure hypercholesterolemia, unspecified: Secondary | ICD-10-CM | POA: Diagnosis not present

## 2018-02-18 DIAGNOSIS — E119 Type 2 diabetes mellitus without complications: Secondary | ICD-10-CM | POA: Diagnosis not present

## 2018-02-21 DIAGNOSIS — Z1211 Encounter for screening for malignant neoplasm of colon: Secondary | ICD-10-CM | POA: Diagnosis not present

## 2018-02-21 DIAGNOSIS — K635 Polyp of colon: Secondary | ICD-10-CM | POA: Diagnosis not present

## 2018-02-21 DIAGNOSIS — K5730 Diverticulosis of large intestine without perforation or abscess without bleeding: Secondary | ICD-10-CM | POA: Diagnosis not present

## 2018-02-21 DIAGNOSIS — D120 Benign neoplasm of cecum: Secondary | ICD-10-CM | POA: Diagnosis not present

## 2018-02-25 ENCOUNTER — Other Ambulatory Visit: Payer: Self-pay | Admitting: Family Medicine

## 2018-02-25 DIAGNOSIS — M8580 Other specified disorders of bone density and structure, unspecified site: Secondary | ICD-10-CM

## 2018-02-26 DIAGNOSIS — D120 Benign neoplasm of cecum: Secondary | ICD-10-CM | POA: Diagnosis not present

## 2018-02-26 DIAGNOSIS — K635 Polyp of colon: Secondary | ICD-10-CM | POA: Diagnosis not present

## 2018-03-19 ENCOUNTER — Telehealth: Payer: Self-pay | Admitting: Oncology

## 2018-03-19 NOTE — Telephone Encounter (Signed)
Patient came in to reschedule  °

## 2018-03-20 ENCOUNTER — Other Ambulatory Visit: Payer: Self-pay | Admitting: Oncology

## 2018-03-20 ENCOUNTER — Inpatient Hospital Stay: Payer: Medicare Other | Attending: Family Medicine

## 2018-03-20 DIAGNOSIS — Z79899 Other long term (current) drug therapy: Secondary | ICD-10-CM | POA: Diagnosis not present

## 2018-03-20 DIAGNOSIS — D72829 Elevated white blood cell count, unspecified: Secondary | ICD-10-CM | POA: Insufficient documentation

## 2018-03-20 DIAGNOSIS — D75839 Thrombocytosis, unspecified: Secondary | ICD-10-CM

## 2018-03-20 DIAGNOSIS — D473 Essential (hemorrhagic) thrombocythemia: Secondary | ICD-10-CM | POA: Insufficient documentation

## 2018-03-20 DIAGNOSIS — D72828 Other elevated white blood cell count: Secondary | ICD-10-CM

## 2018-03-20 LAB — CBC WITH DIFFERENTIAL/PLATELET
Abs Immature Granulocytes: 0.09 10*3/uL — ABNORMAL HIGH (ref 0.00–0.07)
Basophils Absolute: 0.1 10*3/uL (ref 0.0–0.1)
Basophils Relative: 1 %
Eosinophils Absolute: 0.6 10*3/uL — ABNORMAL HIGH (ref 0.0–0.5)
Eosinophils Relative: 3 %
HCT: 37.3 % (ref 36.0–46.0)
Hemoglobin: 12.6 g/dL (ref 12.0–15.0)
Immature Granulocytes: 1 %
Lymphocytes Relative: 19 %
Lymphs Abs: 3.3 10*3/uL (ref 0.7–4.0)
MCH: 29 pg (ref 26.0–34.0)
MCHC: 33.8 g/dL (ref 30.0–36.0)
MCV: 85.7 fL (ref 80.0–100.0)
Monocytes Absolute: 0.5 10*3/uL (ref 0.1–1.0)
Monocytes Relative: 3 %
Neutro Abs: 12.7 10*3/uL — ABNORMAL HIGH (ref 1.7–7.7)
Neutrophils Relative %: 73 %
Platelets: 541 10*3/uL — ABNORMAL HIGH (ref 150–400)
RBC: 4.35 MIL/uL (ref 3.87–5.11)
RDW: 13.6 % (ref 11.5–15.5)
WBC: 17.2 10*3/uL — ABNORMAL HIGH (ref 4.0–10.5)
nRBC: 0 % (ref 0.0–0.2)

## 2018-03-21 ENCOUNTER — Other Ambulatory Visit: Payer: Medicare Other

## 2018-03-26 ENCOUNTER — Other Ambulatory Visit: Payer: Self-pay | Admitting: Oncology

## 2018-03-26 NOTE — Progress Notes (Signed)
Tanya Mitchell's platelet count remains on the high side as does her white cell count.  I have not been able to document a relevant mutation.  This however is persistent.  We are going to proceed to bone marrow biopsy for evaluation of possible myeloproliferative disease.

## 2018-03-28 ENCOUNTER — Ambulatory Visit: Payer: Medicare Other | Admitting: Oncology

## 2018-04-03 ENCOUNTER — Inpatient Hospital Stay (HOSPITAL_BASED_OUTPATIENT_CLINIC_OR_DEPARTMENT_OTHER): Payer: Medicare Other | Admitting: Adult Health

## 2018-04-03 ENCOUNTER — Encounter: Payer: Self-pay | Admitting: Adult Health

## 2018-04-03 ENCOUNTER — Inpatient Hospital Stay: Payer: Medicare Other

## 2018-04-03 ENCOUNTER — Telehealth: Payer: Self-pay | Admitting: Adult Health

## 2018-04-03 VITALS — BP 137/67 | HR 70 | Temp 97.9°F | Resp 16

## 2018-04-03 DIAGNOSIS — D473 Essential (hemorrhagic) thrombocythemia: Secondary | ICD-10-CM | POA: Diagnosis not present

## 2018-04-03 DIAGNOSIS — D7589 Other specified diseases of blood and blood-forming organs: Secondary | ICD-10-CM | POA: Diagnosis not present

## 2018-04-03 DIAGNOSIS — D72829 Elevated white blood cell count, unspecified: Secondary | ICD-10-CM | POA: Diagnosis not present

## 2018-04-03 DIAGNOSIS — D72828 Other elevated white blood cell count: Secondary | ICD-10-CM

## 2018-04-03 DIAGNOSIS — Z79899 Other long term (current) drug therapy: Secondary | ICD-10-CM | POA: Diagnosis not present

## 2018-04-03 DIAGNOSIS — D75839 Thrombocytosis, unspecified: Secondary | ICD-10-CM

## 2018-04-03 LAB — CBC WITH DIFFERENTIAL/PLATELET
Abs Immature Granulocytes: 0.12 10*3/uL — ABNORMAL HIGH (ref 0.00–0.07)
Basophils Absolute: 0.1 10*3/uL (ref 0.0–0.1)
Basophils Relative: 1 %
Eosinophils Absolute: 0.4 10*3/uL (ref 0.0–0.5)
Eosinophils Relative: 3 %
HCT: 39.3 % (ref 36.0–46.0)
Hemoglobin: 13.1 g/dL (ref 12.0–15.0)
Immature Granulocytes: 1 %
Lymphocytes Relative: 19 %
Lymphs Abs: 3 10*3/uL (ref 0.7–4.0)
MCH: 28.4 pg (ref 26.0–34.0)
MCHC: 33.3 g/dL (ref 30.0–36.0)
MCV: 85.1 fL (ref 80.0–100.0)
Monocytes Absolute: 0.4 10*3/uL (ref 0.1–1.0)
Monocytes Relative: 3 %
Neutro Abs: 11.4 10*3/uL — ABNORMAL HIGH (ref 1.7–7.7)
Neutrophils Relative %: 73 %
Platelets: 552 10*3/uL — ABNORMAL HIGH (ref 150–400)
RBC: 4.62 MIL/uL (ref 3.87–5.11)
RDW: 13.4 % (ref 11.5–15.5)
WBC: 15.5 10*3/uL — ABNORMAL HIGH (ref 4.0–10.5)
nRBC: 0 % (ref 0.0–0.2)

## 2018-04-03 MED ORDER — LIDOCAINE HCL 2 % IJ SOLN
INTRAMUSCULAR | Status: AC
  Filled 2018-04-03: qty 20

## 2018-04-03 NOTE — Telephone Encounter (Signed)
No los °

## 2018-04-03 NOTE — Progress Notes (Signed)
INDICATION: leukocytosis and thrombocytosis  Brief history: Patient has h/o breast cancer s/p mastectomy and lumpectomy.  She also has h/o melanoma.  PMH reviewed.  She is taking Metformin for borderline diabetes.  She is also taking Coreg for her cardiomyopathy secondary to Doxorubicin chemotherapy.  She is doing well today.  She is having the procedure done today for the above indications.  She is asymptomatic, has a normal activity level.  She has no other concerns today.    Brief examination was performed. ENT: adequate airway clearance Heart: regular rate and rhythm.No Murmurs Lungs: clear to auscultation, no wheezes, normal respiratory effort  Bone Marrow Biopsy and Aspiration Procedure Note   Informed consent was obtained and potential risks including bleeding, infection and pain were reviewed with the patient.  The patient's name, date of birth, identification, consent and allergies were verified prior to the start of procedure and time out was performed.  The left posterior iliac crest was chosen as the site of biopsy.  The skin was prepped with ChloraPrep.   8 cc of 2% lidocaine was used to provide local anaesthesia.   10 cc of bone marrow aspirate was obtained followed by 1cm biopsy.  Pressure was applied to the biopsy site and bandage was placed over the biopsy site. Patient was made to lie on the back for 30 mins prior to discharge.  The procedure was tolerated well. COMPLICATIONS: None BLOOD LOSS: none The patient was discharged home in stable condition with a 1 week follow up to review results.  Patient was provided with post bone marrow biopsy instructions and instructed to call if there was any bleeding or worsening pain.  Specimens sent for flow cytometry, cytogenetics and additional studies.  Signed Lindsey C Causey, NP   

## 2018-04-03 NOTE — Progress Notes (Signed)
Bone marrow biopsy/aspiration performed by Wilber Bihari, NP with no difficulty. Patient tolerated procedure well. Hemostasis achieved with direct pressure. Sterile gauze dressing applied. At time of discharge, dressing remained clean, dry, and intact. Patient voiced no complaints. Discharged to lab in stable condition.

## 2018-04-03 NOTE — Patient Instructions (Signed)

## 2018-04-04 ENCOUNTER — Other Ambulatory Visit: Payer: Self-pay | Admitting: Oncology

## 2018-04-07 ENCOUNTER — Encounter: Payer: Self-pay | Admitting: Oncology

## 2018-04-07 ENCOUNTER — Other Ambulatory Visit: Payer: Self-pay | Admitting: Oncology

## 2018-04-07 NOTE — Progress Notes (Unsigned)
I called Tanya Mitchell and gave her the preliminary report on her bone marrow biopsy.  If the cytogenetics and FISH we are sending do not clarify the diagnosis, I am going to see if Jerrye Noble at [facility] can review her case

## 2018-04-15 ENCOUNTER — Encounter (HOSPITAL_COMMUNITY): Payer: Self-pay | Admitting: Oncology

## 2018-04-22 ENCOUNTER — Ambulatory Visit
Admission: RE | Admit: 2018-04-22 | Discharge: 2018-04-22 | Disposition: A | Discharge status: Home / Self Care | Payer: Medicare Other | Source: Ambulatory Visit | Attending: Family Medicine | Admitting: Family Medicine

## 2018-04-22 DIAGNOSIS — M8580 Other specified disorders of bone density and structure, unspecified site: Secondary | ICD-10-CM

## 2018-04-22 DIAGNOSIS — M8589 Other specified disorders of bone density and structure, multiple sites: Secondary | ICD-10-CM | POA: Diagnosis not present

## 2018-04-22 DIAGNOSIS — Z780 Asymptomatic menopausal state: Secondary | ICD-10-CM | POA: Diagnosis not present

## 2018-04-23 ENCOUNTER — Other Ambulatory Visit: Payer: Self-pay | Admitting: Oncology

## 2018-04-23 DIAGNOSIS — Z85820 Personal history of malignant melanoma of skin: Secondary | ICD-10-CM | POA: Diagnosis not present

## 2018-04-23 DIAGNOSIS — L2089 Other atopic dermatitis: Secondary | ICD-10-CM | POA: Diagnosis not present

## 2018-04-23 DIAGNOSIS — L814 Other melanin hyperpigmentation: Secondary | ICD-10-CM | POA: Diagnosis not present

## 2018-04-23 DIAGNOSIS — D473 Essential (hemorrhagic) thrombocythemia: Secondary | ICD-10-CM

## 2018-04-23 DIAGNOSIS — D485 Neoplasm of uncertain behavior of skin: Secondary | ICD-10-CM | POA: Diagnosis not present

## 2018-04-23 DIAGNOSIS — D75839 Thrombocytosis, unspecified: Secondary | ICD-10-CM

## 2018-04-23 DIAGNOSIS — D1809 Hemangioma of other sites: Secondary | ICD-10-CM | POA: Diagnosis not present

## 2018-04-23 NOTE — Progress Notes (Unsigned)
Tanya Mitchell's aspirate was not sent for PCR testing-- we wil obtain this from her peripheral bood in the next few days. She is aware.  GM

## 2018-04-30 ENCOUNTER — Other Ambulatory Visit: Payer: Self-pay

## 2018-04-30 ENCOUNTER — Encounter: Payer: Self-pay | Admitting: Oncology

## 2018-04-30 ENCOUNTER — Inpatient Hospital Stay: Payer: Medicare Other | Attending: Family Medicine

## 2018-04-30 DIAGNOSIS — D72829 Elevated white blood cell count, unspecified: Secondary | ICD-10-CM | POA: Insufficient documentation

## 2018-04-30 DIAGNOSIS — D72828 Other elevated white blood cell count: Secondary | ICD-10-CM

## 2018-04-30 DIAGNOSIS — D473 Essential (hemorrhagic) thrombocythemia: Secondary | ICD-10-CM | POA: Diagnosis not present

## 2018-04-30 DIAGNOSIS — D759 Disease of blood and blood-forming organs, unspecified: Secondary | ICD-10-CM | POA: Diagnosis not present

## 2018-04-30 DIAGNOSIS — D75839 Thrombocytosis, unspecified: Secondary | ICD-10-CM

## 2018-04-30 LAB — CBC WITH DIFFERENTIAL/PLATELET
Abs Immature Granulocytes: 0.04 10*3/uL (ref 0.00–0.07)
Basophils Absolute: 0.1 10*3/uL (ref 0.0–0.1)
Basophils Relative: 1 %
Eosinophils Absolute: 0.4 10*3/uL (ref 0.0–0.5)
Eosinophils Relative: 3 %
HCT: 38.1 % (ref 36.0–46.0)
Hemoglobin: 12.7 g/dL (ref 12.0–15.0)
Immature Granulocytes: 0 %
Lymphocytes Relative: 25 %
Lymphs Abs: 3.1 10*3/uL (ref 0.7–4.0)
MCH: 28.9 pg (ref 26.0–34.0)
MCHC: 33.3 g/dL (ref 30.0–36.0)
MCV: 86.8 fL (ref 80.0–100.0)
Monocytes Absolute: 0.4 10*3/uL (ref 0.1–1.0)
Monocytes Relative: 3 %
Neutro Abs: 8.1 10*3/uL — ABNORMAL HIGH (ref 1.7–7.7)
Neutrophils Relative %: 68 %
Platelets: 531 10*3/uL — ABNORMAL HIGH (ref 150–400)
RBC: 4.39 MIL/uL (ref 3.87–5.11)
RDW: 14.1 % (ref 11.5–15.5)
WBC: 12.1 10*3/uL — ABNORMAL HIGH (ref 4.0–10.5)
nRBC: 0 % (ref 0.0–0.2)

## 2018-05-01 ENCOUNTER — Other Ambulatory Visit: Payer: Self-pay | Admitting: *Deleted

## 2018-05-01 ENCOUNTER — Telehealth: Payer: Self-pay | Admitting: *Deleted

## 2018-05-01 DIAGNOSIS — D72829 Elevated white blood cell count, unspecified: Secondary | ICD-10-CM

## 2018-05-01 DIAGNOSIS — D75839 Thrombocytosis, unspecified: Secondary | ICD-10-CM

## 2018-05-01 DIAGNOSIS — D473 Essential (hemorrhagic) thrombocythemia: Secondary | ICD-10-CM

## 2018-05-01 NOTE — Telephone Encounter (Signed)
Referral order placed per MD for consult with Dr Jerrye Noble at [facility] hematology for work up in this office did not give a definitive diagnosis for pt's leukocytosis and thrombocytosis.  This note will be sent to HIM for appointment and records to be sent.

## 2018-05-02 ENCOUNTER — Telehealth: Payer: Self-pay | Admitting: Oncology

## 2018-05-02 NOTE — Telephone Encounter (Signed)
Faxed records to Dr. Florene Glen at [facility]

## 2018-05-02 NOTE — Telephone Encounter (Signed)
FA

## 2018-05-06 ENCOUNTER — Telehealth: Payer: Self-pay | Admitting: Oncology

## 2018-05-06 NOTE — Telephone Encounter (Signed)
Called regarding 9/8 °

## 2018-05-09 LAB — BCR/ABL

## 2018-05-13 ENCOUNTER — Ambulatory Visit: Payer: Medicare Other | Admitting: Oncology

## 2018-05-29 NOTE — Progress Notes (Signed)
Nurse placed call to Dr. Abel Presto office at [facility] to follow up with referral.  Patient scheduled for 11/18/2018 @ 8:15am.   [facility] office will call patient to initiate screening questions and to notify of appointment.  Pt will be placed on wait list for sooner appointment if available.

## 2018-06-20 DIAGNOSIS — E785 Hyperlipidemia, unspecified: Secondary | ICD-10-CM | POA: Diagnosis not present

## 2018-06-20 DIAGNOSIS — D72828 Other elevated white blood cell count: Secondary | ICD-10-CM | POA: Diagnosis not present

## 2018-06-20 DIAGNOSIS — D473 Essential (hemorrhagic) thrombocythemia: Secondary | ICD-10-CM | POA: Diagnosis not present

## 2018-06-20 DIAGNOSIS — D72829 Elevated white blood cell count, unspecified: Secondary | ICD-10-CM | POA: Diagnosis not present

## 2018-06-20 DIAGNOSIS — I10 Essential (primary) hypertension: Secondary | ICD-10-CM | POA: Diagnosis not present

## 2018-06-28 DIAGNOSIS — D7589 Other specified diseases of blood and blood-forming organs: Secondary | ICD-10-CM | POA: Diagnosis not present

## 2018-07-18 DIAGNOSIS — H35362 Drusen (degenerative) of macula, left eye: Secondary | ICD-10-CM | POA: Diagnosis not present

## 2018-07-18 DIAGNOSIS — Z961 Presence of intraocular lens: Secondary | ICD-10-CM | POA: Diagnosis not present

## 2018-08-13 ENCOUNTER — Other Ambulatory Visit: Payer: Self-pay | Admitting: Family Medicine

## 2018-08-13 DIAGNOSIS — Z1231 Encounter for screening mammogram for malignant neoplasm of breast: Secondary | ICD-10-CM

## 2018-09-30 ENCOUNTER — Other Ambulatory Visit: Payer: Self-pay

## 2018-09-30 ENCOUNTER — Ambulatory Visit
Admission: RE | Admit: 2018-09-30 | Discharge: 2018-09-30 | Disposition: A | Discharge status: Home / Self Care | Payer: Medicare Other | Source: Ambulatory Visit | Attending: Family Medicine | Admitting: Family Medicine

## 2018-09-30 DIAGNOSIS — Z1231 Encounter for screening mammogram for malignant neoplasm of breast: Secondary | ICD-10-CM | POA: Diagnosis not present

## 2018-10-18 ENCOUNTER — Other Ambulatory Visit: Payer: Self-pay

## 2018-10-18 ENCOUNTER — Telehealth: Payer: Self-pay | Admitting: Oncology

## 2018-10-18 DIAGNOSIS — D0511 Intraductal carcinoma in situ of right breast: Secondary | ICD-10-CM

## 2018-10-18 NOTE — Telephone Encounter (Signed)
Returned patient's phone call regarding rescheduling 09/08 appointments, patient requested it be moved to 09/24.   Message to provider.

## 2018-10-22 ENCOUNTER — Inpatient Hospital Stay: Payer: Medicare Other

## 2018-10-22 ENCOUNTER — Inpatient Hospital Stay: Payer: Medicare Other | Admitting: Oncology

## 2018-10-25 NOTE — Progress Notes (Signed)
CARDIOLOGY OFFICE NOTE  Date:  10/29/2018    Tanya Mitchell Date of Birth: 07-24-1945 Medical Record [id number]  PCP:  Tanya Ruff, MD  Cardiologist:  Tanya Mitchell     Chief Complaint  Patient presents with  . Follow-up    History of Present Illness: Tanya Mitchell is a 73 y.o. female who presents today for a one year check. She isa former patient ofDr. McLean's as well asDr. Tennant's.Primarily follows with me.  She has had a prior cardiomyopathy secondary to chemotherapy for breast cancer in the remote past. Her EF recovered. Last echo was inMayof 2014. Other issues include melanoma - now seeing Tanya Mitchell and Tanya Mitchell at [facility] for her melanoma.Labs are checked by PCP.She has HLD and DM as well.  I have followed her over the past several years - she has done well. Last echo in 2014. Has been released from oncology. Last seen in September of 2019 and was continuing to do well.   The patient does not have symptoms concerning for COVID-19 infection (fever, chills, cough, or new shortness of breath).   Comes in today. Here alone. Doing well. No chest pain. Breathing is fine. She is in the process of building a house. Downsizing. Feels good. Weight is stable. She has had some issues with her blood counts - elevated WBC and platelets - she actually went to [facility] - nothing has really showed up. This is good. She feels good on her medicines. No swelling. We did talk about getting repeat echo due to last year's findings.   Past Medical History:  Diagnosis Date  . Breast cancer ([place]) 1995 and 2007  . Cardiomyopathy secondary to chemotherapy [facility])    has improved   . Hyperlipidemia   . Hypertension    under control, has been on med. x 3 yrs.  . Melanoma in situ of back ([place]) 09/2011   left  . Personal history of chemotherapy   . Personal history of radiation therapy   . Skin cancer 2013   Melanoma    Past Surgical History:  Procedure  Laterality Date  . BREAST LUMPECTOMY  12/21/2005   right  . MASTECTOMY  1995   with breast reconstruction - left     Medications: Current Meds  Medication Sig  . aspirin EC 81 MG tablet Take 1 tablet (81 mg total) by mouth daily.  [name] atorvastatin (LIPITOR) 40 MG tablet Take 40 mg by mouth daily.  . carvedilol (COREG) 12.5 MG tablet Take 1 tablet (12.5 mg total) by mouth 2 (two) times daily.  . cholecalciferol (VITAMIN D) 1000 UNITS tablet Take 2,000 Units by mouth daily.   . hydrochlorothiazide (HYDRODIURIL) 25 MG tablet TAKE 1 TABLET BY MOUTH DAILY  . metFORMIN (GLUCOPHAGE) 500 MG tablet 250 mg twice a day.  . ramipril (ALTACE) 10 MG capsule TAKE ONE CAPSULE BY MOUTH DAILY     Allergies: No Known Allergies  Social History: The patient  reports that she quit smoking about 54 years ago. She has never used smokeless tobacco. She reports current alcohol use. She reports that she does not use drugs.   Family History: The patient's family history includes Autism in her son; Breast cancer in her daughter; Cancer in her father; Lung cancer in her father.   Review of Systems: Please see the history of present illness.   All other systems are reviewed and negative.   Physical Exam: VS:  BP 130/70   Pulse 78  Ht 5\' 2"  (1.575 m)   Wt 128 lb 1.9 oz (58.1 kg)   SpO2 98%   BMI 23.43 kg/m  .  BMI Body mass index is 23.43 kg/m.  Wt Readings from Last 3 Encounters:  10/29/18 128 lb 1.9 oz (58.1 kg)  10/23/17 126 lb 6.4 oz (57.3 kg)  10/18/16 129 lb (58.5 kg)    General: Pleasant. Well developed, well nourished and in no acute distress.   HEENT: Normal.  Neck: Supple, no JVD, carotid bruits, or masses noted.  Cardiac: Regular rate and rhythm. No murmurs, rubs, or gallops. No edema.  Respiratory:  Lungs are clear to auscultation bilaterally with normal work of breathing.  GI: Soft and nontender.  MS: No deformity or atrophy. Gait and ROM intact.  Skin: Warm and dry. Color is  normal.  Neuro:  Strength and sensation are intact and no gross focal deficits noted.  Psych: Alert, appropriate and with normal affect.   LABORATORY DATA:  EKG:  EKG is ordered today. This demonstrates NSr with 1st degree AV block - incomplete RBBB - PVCs noted today.  Lab Results  Component Value Date   WBC 12.1 (H) 04/30/2018   HGB 12.7 04/30/2018   HCT 38.1 04/30/2018   PLT 531 (H) 04/30/2018   GLUCOSE 124 11/30/2015   ALT 27 11/30/2015   AST 25 11/30/2015   NA 139 11/30/2015   K 4.6 11/30/2015   CL 99 04/23/2012   CREATININE 0.7 11/30/2015   BUN 13.1 11/30/2015   CO2 27 11/30/2015       BNP (last 3 results) No results for input(s): BNP in the last 8760 hours.  ProBNP (last 3 results) No results for input(s): PROBNP in the last 8760 hours.   Other Studies Reviewed Today:  Echo Study Conclusions 10/2017  - Left ventricle: The cavity size was normal. Wall thickness was   normal. Systolic function was normal. The estimated ejection   fraction was in the range of 50% to 55%. Diffusely abnormal GLS   at -13%. Wall motion was normal; there were no regional wall   motion abnormalities. Doppler parameters are consistent with   abnormal left ventricular relaxation (grade 1 diastolic   dysfunction). The E/e&' ratio is between 8-15, suggesting   indeterminate LV filling pressure. - Mitral valve: Mildly thickened leaflets . There was trivial   regurgitation. - Left atrium: The atrium was normal in size. - Inferior vena cava: The vessel was normal in size. The   respirophasic diameter changes were in the normal range (>= 50%),   consistent with normal central venous pressure.  Impressions:  - Low normal LVEF 50-55%, diffusely abnormal GLS at -13%, grade 1   DD, indeterminate LV filling pressure, normal LA size.   Notes recorded by Tanya Junes, NP on 11/05/2017 at 2:23 PM EDT  I reviewed this study with Tanya Mitchell last week - I have reported this to [facility]  myself - we will plan to repeat this study in one year. Discussed the GLS - unknown if this is a reflection from the past or not. Tanya Mitchell remains without symptoms.    EchoStudy Conclusionsfrom 2014  - Left ventricle: The cavity size was normal. Wall thickness was normal. The estimated ejection fraction was 55%. - Mitral valve: Mild regurgitation. - Atrial septum: No defect or patent foramen ovale was identified.  Assessment/Plan:  1. History of CM - will get her echo updated - she is doing well and would be NYHA I - I  had talked with Tanya Mitchell about her study last year and the significance of the GLS is unclear if this is a reflection of her past LV dysfunction or what is to come. She is on beta blocker and ARB.   2. Melanoma/breast cancer - followed by oncology.   3. HLD - She will be having labs at [facility] and then with her PCP.   4. HTN - BP looks good - no changes made today.   5. Elevated WBC and platelet count - she is being followed by [facility].  6. PVCs on EKG - not symptomatic.   7. COVID-19 Education: The signs and symptoms of COVID-19 were discussed with the patient and how to seek care for testing (follow up with PCP or arrange E-visit).  The importance of social distancing, staying at home, hand hygiene and wearing a mask when out in public were discussed today.  Current medicines are reviewed with the patient today.  The patient does not have concerns regarding medicines other than what has been noted above.  The following changes have been made:  See above.  Labs/ tests ordered today include:    Orders Placed This Encounter  Procedures  . EKG 12-Lead  . ECHOCARDIOGRAM COMPLETE     Disposition:   FU with me in 9 months.   Patient is agreeable to this plan and will call if any problems develop in the interim.   SignedTruitt Merle, NP  10/29/2018 3:25 PM  [facility] [address] [place], [place]   [address] Phone: [phone] Fax: [phone]

## 2018-10-29 ENCOUNTER — Other Ambulatory Visit: Payer: Self-pay

## 2018-10-29 ENCOUNTER — Encounter: Payer: Self-pay | Admitting: Nurse Practitioner

## 2018-10-29 ENCOUNTER — Ambulatory Visit (INDEPENDENT_AMBULATORY_CARE_PROVIDER_SITE_OTHER): Payer: Medicare Other | Admitting: Nurse Practitioner

## 2018-10-29 VITALS — BP 130/70 | HR 78 | Ht 62.0 in | Wt 128.1 lb

## 2018-10-29 DIAGNOSIS — I428 Other cardiomyopathies: Secondary | ICD-10-CM | POA: Diagnosis not present

## 2018-10-29 DIAGNOSIS — R011 Cardiac murmur, unspecified: Secondary | ICD-10-CM | POA: Diagnosis not present

## 2018-10-29 DIAGNOSIS — Z7189 Other specified counseling: Secondary | ICD-10-CM | POA: Diagnosis not present

## 2018-10-29 NOTE — Patient Instructions (Addendum)
After Visit Summary:  We will be checking the following labs today - NONE  Medication Instructions:    Continue with your current medicines.    If you need a refill on your cardiac medications before your next appointment, please call your pharmacy.     Testing/Procedures To Be Arranged:  Echocardiogram   Follow-Up:   See me in 9 months    At [facility], you and your health needs are our priority.  As part of our continuing mission to provide you with exceptional heart care, we have created designated Provider Care Teams.  These Care Teams include your primary Cardiologist (physician) and Advanced Practice Providers (APPs -  Physician Assistants and Nurse Practitioners) who all work together to provide you with the care you need, when you need it.  Special Instructions:  . Stay safe, stay home, wash your hands for at least 20 seconds and wear a mask when out in public.  . It was good to talk with you today.    Call the [place] office at [phone] if you have any questions, problems or concerns.

## 2018-10-31 DIAGNOSIS — Z853 Personal history of malignant neoplasm of breast: Secondary | ICD-10-CM | POA: Diagnosis not present

## 2018-10-31 DIAGNOSIS — I10 Essential (primary) hypertension: Secondary | ICD-10-CM | POA: Diagnosis not present

## 2018-10-31 DIAGNOSIS — E785 Hyperlipidemia, unspecified: Secondary | ICD-10-CM | POA: Diagnosis not present

## 2018-10-31 DIAGNOSIS — D72829 Elevated white blood cell count, unspecified: Secondary | ICD-10-CM | POA: Diagnosis not present

## 2018-10-31 DIAGNOSIS — Z85828 Personal history of other malignant neoplasm of skin: Secondary | ICD-10-CM | POA: Diagnosis not present

## 2018-10-31 DIAGNOSIS — D72828 Other elevated white blood cell count: Secondary | ICD-10-CM | POA: Diagnosis not present

## 2018-10-31 DIAGNOSIS — D473 Essential (hemorrhagic) thrombocythemia: Secondary | ICD-10-CM | POA: Diagnosis not present

## 2018-10-31 DIAGNOSIS — Z9012 Acquired absence of left breast and nipple: Secondary | ICD-10-CM | POA: Diagnosis not present

## 2018-10-31 DIAGNOSIS — D471 Chronic myeloproliferative disease: Secondary | ICD-10-CM | POA: Diagnosis not present

## 2018-11-04 ENCOUNTER — Telehealth: Payer: Self-pay | Admitting: *Deleted

## 2018-11-04 ENCOUNTER — Telehealth: Payer: Self-pay | Admitting: Oncology

## 2018-11-04 NOTE — Telephone Encounter (Signed)
Shyonna left VM stating she was seen last week at [facility] by Dr Joan Mayans with labs obtained.  Robie states " my labs actually were a bit improved and the recommendation of Dr Joan Mayans is to cancel appointments this week- and have lab check in 4 months in [place] and then I will return to see him in 8 months."  This RN sent a request to reschedule lab and MD to mid January 2021 per message left by Garrett.  This note will be sent to MD for review and further recommendations if needed.

## 2018-11-04 NOTE — Telephone Encounter (Signed)
Scheduled appt per 9/21 sch message - pt aware of appt date and time   

## 2018-11-07 ENCOUNTER — Inpatient Hospital Stay: Payer: Medicare Other | Admitting: Oncology

## 2018-11-07 ENCOUNTER — Inpatient Hospital Stay: Payer: Medicare Other

## 2018-11-13 ENCOUNTER — Ambulatory Visit (HOSPITAL_COMMUNITY): Payer: Medicare Other | Attending: Cardiovascular Disease

## 2018-11-13 ENCOUNTER — Other Ambulatory Visit: Payer: Self-pay

## 2018-11-13 DIAGNOSIS — I428 Other cardiomyopathies: Secondary | ICD-10-CM | POA: Diagnosis not present

## 2018-11-18 ENCOUNTER — Other Ambulatory Visit: Payer: Self-pay | Admitting: *Deleted

## 2018-11-18 DIAGNOSIS — I428 Other cardiomyopathies: Secondary | ICD-10-CM

## 2018-11-18 MED ORDER — SPIRONOLACTONE 25 MG PO TABS: 12.5000 mg | ORAL_TABLET | Freq: Every day | ORAL | 2 refills | Status: DC

## 2018-11-21 ENCOUNTER — Other Ambulatory Visit: Payer: Medicare Other | Admitting: *Deleted

## 2018-11-21 ENCOUNTER — Other Ambulatory Visit: Payer: Self-pay

## 2018-11-21 DIAGNOSIS — I428 Other cardiomyopathies: Secondary | ICD-10-CM | POA: Diagnosis not present

## 2018-11-21 LAB — BASIC METABOLIC PANEL
BUN/Creatinine Ratio: 25 (ref 12–28)
BUN: 15 mg/dL (ref 8–27)
CO2: 25 mmol/L (ref 20–29)
Calcium: 9.7 mg/dL (ref 8.7–10.3)
Chloride: 97 mmol/L (ref 96–106)
Creatinine, Ser: 0.61 mg/dL (ref 0.57–1.00)
GFR calc Af Amer: 103 mL/min/{1.73_m2} (ref >59)
GFR calc non Af Amer: 90 mL/min/{1.73_m2} (ref >59)
Glucose: 136 mg/dL — ABNORMAL HIGH (ref 65–99)
Potassium: 5.5 mmol/L — ABNORMAL HIGH (ref 3.5–5.2)
Sodium: 137 mmol/L (ref 134–144)

## 2018-11-22 ENCOUNTER — Telehealth: Payer: Self-pay

## 2018-11-22 NOTE — Telephone Encounter (Signed)
-----   Message from Burtis Junes, NP sent at 11/21/2018  8:00 PM EDT ----- Please call.  Please instruct her to NOT start Aldactone as previously instructed - potassium level is too high to start Aldactone.  Needs low potassium diet  Repeat BMET next week.  She may stay off HCTZ during this time also.

## 2018-11-22 NOTE — Telephone Encounter (Signed)
Pt verbalized understanding of her labs results.. we went over foods that are high in K to avoid. She will return 11/28/18 for a repeat BMET.

## 2018-11-25 ENCOUNTER — Other Ambulatory Visit: Payer: Self-pay | Admitting: Nurse Practitioner

## 2018-11-25 ENCOUNTER — Other Ambulatory Visit: Payer: Self-pay | Admitting: *Deleted

## 2018-11-25 MED ORDER — CARVEDILOL 12.5 MG PO TABS: 12.5000 mg | ORAL_TABLET | Freq: Two times a day (BID) | ORAL | 3 refills | Status: DC

## 2018-11-25 NOTE — Telephone Encounter (Signed)
Updated medication list

## 2018-11-27 ENCOUNTER — Other Ambulatory Visit: Payer: Self-pay | Admitting: Nurse Practitioner

## 2018-11-28 ENCOUNTER — Other Ambulatory Visit: Payer: Self-pay

## 2018-11-28 ENCOUNTER — Other Ambulatory Visit: Payer: Medicare Other

## 2018-11-28 DIAGNOSIS — I428 Other cardiomyopathies: Secondary | ICD-10-CM | POA: Diagnosis not present

## 2018-11-29 LAB — BASIC METABOLIC PANEL
BUN/Creatinine Ratio: 22 (ref 12–28)
BUN: 14 mg/dL (ref 8–27)
CO2: 26 mmol/L (ref 20–29)
Calcium: 10.1 mg/dL (ref 8.7–10.3)
Chloride: 102 mmol/L (ref 96–106)
Creatinine, Ser: 0.63 mg/dL (ref 0.57–1.00)
GFR calc Af Amer: 102 mL/min/{1.73_m2} (ref >59)
GFR calc non Af Amer: 89 mL/min/{1.73_m2} (ref >59)
Glucose: 126 mg/dL — ABNORMAL HIGH (ref 65–99)
Potassium: 5.2 mmol/L (ref 3.5–5.2)
Sodium: 142 mmol/L (ref 134–144)

## 2018-12-09 ENCOUNTER — Other Ambulatory Visit: Payer: Self-pay | Admitting: Nurse Practitioner

## 2018-12-09 MED ORDER — CARVEDILOL 25 MG PO TABS: 25.0000 mg | ORAL_TABLET | Freq: Two times a day (BID) | ORAL | 3 refills | Status: DC

## 2018-12-11 ENCOUNTER — Other Ambulatory Visit: Payer: Self-pay | Admitting: Nurse Practitioner

## 2018-12-11 MED ORDER — CARVEDILOL 25 MG PO TABS: 25.0000 mg | ORAL_TABLET | Freq: Two times a day (BID) | ORAL | 3 refills | Status: DC

## 2019-01-01 DIAGNOSIS — Z853 Personal history of malignant neoplasm of breast: Secondary | ICD-10-CM | POA: Diagnosis not present

## 2019-01-01 DIAGNOSIS — E119 Type 2 diabetes mellitus without complications: Secondary | ICD-10-CM | POA: Diagnosis not present

## 2019-01-01 DIAGNOSIS — E7800 Pure hypercholesterolemia, unspecified: Secondary | ICD-10-CM | POA: Diagnosis not present

## 2019-01-01 DIAGNOSIS — I10 Essential (primary) hypertension: Secondary | ICD-10-CM | POA: Diagnosis not present

## 2019-01-16 NOTE — Progress Notes (Signed)
CARDIOLOGY OFFICE NOTE  Date:  01/21/2019    Keith Rake Lynde Date of Birth: 16-Feb-1945 Medical Record [id number]  PCP:  Leighton Ruff, MD  Cardiologist:  Servando Snare   Chief Complaint  Patient presents with  . Follow-up    History of Present Illness: Tanya Mitchell is a 73 y.o. female who presents today for a 3 month check.  She isa former patient ofDr. McLean's as well asDr. Tennant's.Primarily follows with me.  She has had a prior cardiomyopathy secondary to chemotherapy for breast cancer in the remote past. Her EF recovered. Last echo was inMayof 2014. Other issues include melanoma - now seeing Dr. Jana Hakim and Dr. Harriet Masson at [facility] for her melanoma.Labs are checked by PCP.She has HLD and DM as well.  Ihave followed her over the past several years - she has done well. Last echo in 2014. Has been released from oncology.  Last seen in September - continued to do well - had been downsizing. Feeling good - had had some issues with her blood counts - nothing really able to pinpoint as to why. We did get her echo updated - EF dropping some - tried to add Aldactone but potassium too high. Have been able to increase her Coreg.   The patient does not have symptoms concerning for COVID-19 infection (fever, chills, cough, or new shortness of breath).   Comes in today for discussion. Here alone. She is doing well. She feels good. No chest pain. Breathing is good. No swelling. Weight is stable. Tolerating her current regimen. Was not able to be on Aldactone due to hyperkalemia. She is on max Coreg and 10 mg of Altace. BP is ok. She is still waiting on her new house to be finished. Some challenges with that.   Past Medical History:  Diagnosis Date  . Breast cancer ([place]) 1995 and 2007  . Cardiomyopathy secondary to chemotherapy [facility])    has improved   . Hyperlipidemia   . Hypertension    under control, has been on med. x 3 yrs.  . Melanoma in situ of back  ([place]) 09/2011   left  . Personal history of chemotherapy   . Personal history of radiation therapy   . Skin cancer 2013   Melanoma    Past Surgical History:  Procedure Laterality Date  . BREAST LUMPECTOMY  12/21/2005   right  . MASTECTOMY  1995   with breast reconstruction - left     Medications: Current Meds  Medication Sig  . aspirin EC 81 MG tablet Take 1 tablet (81 mg total) by mouth daily.  [name] atorvastatin (LIPITOR) 80 MG tablet Take 1 tablet (80 mg total) by mouth daily.  . carvedilol (COREG) 25 MG tablet Take 1 tablet (25 mg total) by mouth 2 (two) times daily.  . cholecalciferol (VITAMIN D) 1000 UNITS tablet Take 2,000 Units by mouth daily.   . metFORMIN (GLUCOPHAGE) 500 MG tablet 250 mg twice a day.  . ramipril (ALTACE) 10 MG capsule TAKE ONE CAPSULE BY MOUTH DAILY  . [DISCONTINUED] atorvastatin (LIPITOR) 40 MG tablet Take 40 mg by mouth daily.  . [DISCONTINUED] atorvastatin (LIPITOR) 80 MG tablet Take 1 tablet by mouth daily.     Allergies: No Known Allergies  Social History: The patient  reports that she quit smoking about 54 years ago. She has never used smokeless tobacco. She reports current alcohol use. She reports that she does not use drugs.   Family History: The patient's family history includes  Autism in her son; Breast cancer in her daughter; Cancer in her father; Lung cancer in her father.   Review of Systems: Please see the history of present illness.   All other systems are reviewed and negative.   Physical Exam: VS:  BP 130/86   Pulse 70   Ht 5\' 2"  (1.575 m)   Wt 129 lb 12.8 oz (58.9 kg)   SpO2 98%   BMI 23.74 kg/m  .  BMI Body mass index is 23.74 kg/m.  Wt Readings from Last 3 Encounters:  01/21/19 129 lb 12.8 oz (58.9 kg)  10/29/18 128 lb 1.9 oz (58.1 kg)  10/23/17 126 lb 6.4 oz (57.3 kg)    General: Pleasant. Alert and in no acute distress.  She looks good. Weight is stable.  HEENT: Normal.   LABORATORY DATA:  EKG:  EKG is not  ordered today.  Lab Results  Component Value Date   WBC 12.1 (H) 04/30/2018   HGB 12.7 04/30/2018   HCT 38.1 04/30/2018   PLT 531 (H) 04/30/2018   GLUCOSE 126 (H) 11/28/2018   ALT 27 11/30/2015   AST 25 11/30/2015   NA 142 11/28/2018   K 5.2 11/28/2018   CL 102 11/28/2018   CREATININE 0.63 11/28/2018   BUN 14 11/28/2018   CO2 26 11/28/2018       BNP (last 3 results) No results for input(s): BNP in the last 8760 hours.  ProBNP (last 3 results) No results for input(s): PROBNP in the last 8760 hours.   Other Studies Reviewed Today:  ECHO IMPRESSIONS 10/2018   1. Left ventricular ejection fraction, by visual estimation, is 45 to 50%. The left ventricle has mildly decreased function. Normal left ventricular size. There is no left ventricular hypertrophy.  2. Elevated left ventricular end-diastolic pressure.  3. Left ventricular diastolic Doppler parameters are consistent with impaired relaxation pattern of LV diastolic filling.  4. Global longitudinal strain abnormal (-11.5%). Very slightly worse than 10/2017.  5. Global right ventricle has normal systolic function.The right ventricular size is normal. No increase in right ventricular wall thickness.  6. Left atrial size was moderately dilated.  7. Right atrial size was normal.  8. The mitral valve is normal in structure. Trace mitral valve regurgitation. No evidence of mitral stenosis.  9. The tricuspid valve is normal in structure. Tricuspid valve regurgitation is mild. 10. The aortic valve is normal in structure. Aortic valve regurgitation was not visualized by color flow Doppler. Structurally normal aortic valve, with no evidence of sclerosis or stenosis. 11. The pulmonic valve was normal in structure. Pulmonic valve regurgitation is trivial by color flow Doppler. 12. Mildly elevated pulmonary artery systolic pressure. 13. The inferior vena cava is normal in size with greater than 50% respiratory variability, suggesting  right atrial pressure of 3 mmHg.  Echo Study Conclusions 10/2017  - Left ventricle: The cavity size was normal. Wall thickness was normal. Systolic function was normal. The estimated ejection fraction was in the range of 50% to 55%. Diffusely abnormal GLS at -13%. Wall motion was normal; there were no regional wall motion abnormalities. Doppler parameters are consistent with abnormal left ventricular relaxation (grade 1 diastolic dysfunction). The E/e&' ratio is between 8-15, suggesting indeterminate LV filling pressure. - Mitral valve: Mildly thickened leaflets . There was trivial regurgitation. - Left atrium: The atrium was normal in size. - Inferior vena cava: The vessel was normal in size. The respirophasic diameter changes were in the normal range (>= 50%), consistent with normal  central venous pressure.  Impressions:  - Low normal LVEF 50-55%, diffusely abnormal GLS at -13%, grade 1 DD, indeterminate LV filling pressure, normal LA size.   Notes recorded by Burtis Junes, NP on 11/05/2017 at 2:23 PM EDT  I reviewed this study with Dr. Meda Coffee last week - I have reported this to [facility] myself - we will plan to repeat this study in one year. Discussed the GLS - unknown if this is a reflection from the past or not. Lorene remains without symptoms.    EchoStudy Conclusionsfrom 2014  - Left ventricle: The cavity size was normal. Wall thickness was normal. The estimated ejection fraction was 55%. - Mitral valve: Mild regurgitation. - Atrial septum: No defect or patent foramen ovale was identified.  Assessment/Plan:  1. History of CM - most recent echo with some reduction in her EF - she has had abnormal GLS score - I have talked with Dr. Meda Coffee about this in the past - unclear if this was a reflection of past LV dysfunction or what was to come - now with EF down some - she was not able to tolerate Aldactone due to marked hyperkalemia. We have  increased her Coreg to max dose. She is on ACE as well. Will plan to see her back in June - arrange echo in the fall. She is NYHA I at this time. She knows what to be on the lookout for.   2. Melanoma/breast cancer - followed by oncology. Not discussed today.   3. HLD - on statin - needing refilled - this was sent in for her today..   4. HTN - BP is fine - no further changes made.   5. Elevated WBC and platelet count - unknown etiology - followed by [facility].   6. History of PVCs - not endorsed.   7. COVID-19 Education: The signs and symptoms of COVID-19 were discussed with the patient and how to seek care for testing (follow up with PCP or arrange E-visit).  The importance of social distancing, staying at home, hand hygiene and wearing a mask when out in public were discussed today.  Current medicines are reviewed with the patient today.  The patient does not have concerns regarding medicines other than what has been noted above.  The following changes have been made:  See above.  Labs/ tests ordered today include:   No orders of the defined types were placed in this encounter.    Disposition:   FU with me as planned in June.    Patient is agreeable to this plan and will call if any problems develop in the interim.   SignedTruitt Merle, NP  01/21/2019 4:18 PM  [facility] [address] [place], [place]  [address] Phone: [phone] Fax: [phone]

## 2019-01-21 ENCOUNTER — Ambulatory Visit: Payer: Medicare Other | Admitting: Nurse Practitioner

## 2019-01-21 ENCOUNTER — Other Ambulatory Visit: Payer: Self-pay

## 2019-01-21 ENCOUNTER — Encounter: Payer: Self-pay | Admitting: Nurse Practitioner

## 2019-01-21 VITALS — BP 130/86 | HR 70 | Ht 62.0 in | Wt 129.8 lb

## 2019-01-21 DIAGNOSIS — I428 Other cardiomyopathies: Secondary | ICD-10-CM

## 2019-01-21 MED ORDER — ATORVASTATIN CALCIUM 80 MG PO TABS: 80.0000 mg | ORAL_TABLET | Freq: Every day | ORAL | 3 refills | Status: DC

## 2019-01-21 NOTE — Patient Instructions (Addendum)
After Visit Summary:  We will be checking the following labs today - NONE   Medication Instructions:    Continue with your current medicines.    If you need a refill on your cardiac medications before your next appointment, please call your pharmacy.     Testing/Procedures To Be Arranged:  N/A  Follow-Up:   See me as planned in June    At [facility], you and your health needs are our priority.  As part of our continuing mission to provide you with exceptional heart care, we have created designated Provider Care Teams.  These Care Teams include your primary Cardiologist (physician) and Advanced Practice Providers (APPs -  Physician Assistants and Nurse Practitioners) who all work together to provide you with the care you need, when you need it.  Special Instructions:  . Stay safe, stay home, wash your hands for at least 20 seconds and wear a mask when out in public.  . It was good to talk with you today.    Call the [place] office at [phone] if you have any questions, problems or concerns.

## 2019-01-27 DIAGNOSIS — H524 Presbyopia: Secondary | ICD-10-CM | POA: Diagnosis not present

## 2019-01-28 DIAGNOSIS — R197 Diarrhea, unspecified: Secondary | ICD-10-CM | POA: Diagnosis not present

## 2019-01-31 DIAGNOSIS — R197 Diarrhea, unspecified: Secondary | ICD-10-CM | POA: Diagnosis not present

## 2019-02-03 DIAGNOSIS — R197 Diarrhea, unspecified: Secondary | ICD-10-CM | POA: Diagnosis not present

## 2019-02-17 ENCOUNTER — Telehealth: Payer: Self-pay | Admitting: Nurse Practitioner

## 2019-02-17 NOTE — Telephone Encounter (Signed)
New message  Pt c/o medication issue:  1. Name of Medication: spironolactone (ALDACTONE) 25 MG tablet  2. How are you currently taking this medication (dosage and times per day)?  Had discontinued use  3. Are you having a reaction (difficulty breathing--STAT)? No  4. What is your medication issue? Patient is calling in to follow up to make sure she was to discontinue the use of this medication. Please give patient a call back to confirm.

## 2019-02-17 NOTE — Telephone Encounter (Signed)
S/w pt this has been D/C.  Pt is aware.

## 2019-02-21 DIAGNOSIS — E119 Type 2 diabetes mellitus without complications: Secondary | ICD-10-CM | POA: Diagnosis not present

## 2019-02-21 DIAGNOSIS — Z0000 Encounter for general adult medical examination without abnormal findings: Secondary | ICD-10-CM | POA: Diagnosis not present

## 2019-02-21 DIAGNOSIS — E7800 Pure hypercholesterolemia, unspecified: Secondary | ICD-10-CM | POA: Diagnosis not present

## 2019-02-21 DIAGNOSIS — I10 Essential (primary) hypertension: Secondary | ICD-10-CM | POA: Diagnosis not present

## 2019-02-26 DIAGNOSIS — Z86010 Personal history of colonic polyps: Secondary | ICD-10-CM | POA: Diagnosis not present

## 2019-02-26 DIAGNOSIS — E119 Type 2 diabetes mellitus without complications: Secondary | ICD-10-CM | POA: Diagnosis not present

## 2019-02-26 DIAGNOSIS — E7800 Pure hypercholesterolemia, unspecified: Secondary | ICD-10-CM | POA: Diagnosis not present

## 2019-02-26 DIAGNOSIS — Z853 Personal history of malignant neoplasm of breast: Secondary | ICD-10-CM | POA: Diagnosis not present

## 2019-02-26 DIAGNOSIS — R197 Diarrhea, unspecified: Secondary | ICD-10-CM | POA: Diagnosis not present

## 2019-02-26 DIAGNOSIS — I10 Essential (primary) hypertension: Secondary | ICD-10-CM | POA: Diagnosis not present

## 2019-02-26 NOTE — Progress Notes (Signed)
ID: Tanya Mitchell OB: 1945-12-14  MR#: [id number]  CSN#:[id number]  Patient Care Team: Tanya Ruff, MD as PCP - General (Family Medicine) Tanya Junes, NP as Nurse Practitioner (Nurse Practitioner) Tanya Mitchell, Tanya Dad, MD as Consulting Physician (Oncology) Tanya Charleston, MD as Consulting Physician (Obstetrics and Gynecology) Tanya Contes, MD as Referring Physician (Hematology and Oncology) OTHER MD:  CHIEF COMPLAINTS:   1)  Hx Left Breast Cancer 1995       2) Hx Right Breast Cancer 2007                   3)  Hx Melanoma, Stage IIIA       4) leukocytosis/thrombocytosis      CURRENT TREATMENT: observation   INTERVAL HISTORY: Tanya Mitchell returns today for follow-up of her leukocytosis. She was referred to Tanya Mitchell at [facility] for this.  Summary from his note: "Patient has was noted to have a neutrophil pre-dominant leukocytosis starting in 12/2017. WBC count has ranged from 11.8-17.2 since that time. Patient was also noted to have a thrombocytosis dating back to 11/2015. PLT count has ranged from 413- 552 since that time. Per outside record, further evaluation was negative for MPL, CALR, or JAK2 mutations. PCR for BCR-ABL was not detected. Patient subsequently underwent bone marrow biopsy at [facility] on 04/03/18. Bone marrow biopsy revealed an "estimated cellularity of 60-70% with a mixture of myeloid cell types but with predominance of granulocytic precursors including abundant mature neutrophils. Megakaryocytes are also increased with many small forms. Occasional small interstitial and well circumscribed lymphoid aggregates mostly composed of small lymphocytes are seen."   REVIEW OF SYSTEMS: Tanya Mitchell finally got her new house built and they will be moving this month.  At the latter work and she is going up and down 39 steps all the time in her rental apartment (they sold their own home almost as soon as they put it on the market).  Her husband has already got the vaccine but she  has not been able to receive it yet.  She had some diarrhea and her gastroenterologist has sent a second stool culture with results pending.  Aside from this a detailed review of systems today was entirely benign   HISTORY OF PRESENT ILLNESS: From the earlier summary:  Tanya Mitchell's history of breast cancer dates back to June of 1995, when she had left modified radical mastectomy under Tanya Mitchell for a pT1c pN1, stage IIA invasive ductal carcinoma, grade 2, estrogen receptor 97% positive, progesterone receptor 45% positive, treated according to CALGB 9394, sequential arm (doxorubicin x3, fourth dose apparently omitted, followed by high-dose cyclophosphamide x3), followed by tamoxifen for 5 years. There has been no evidence of disease recurrence.  Further, in October of 2007 she underwent right lumpectomy for a ductal carcinoma in situ, high-grade, estrogen and progesterone receptor negative, followed by adjuvant radiation.  In July of 2013 the patient was noted to have an irregular mole in her left upper back. It is not clear to me whether this might be related to her prior left breast irradiation. Shave biopsy of this area 09/15/2011 by Dr. Derrel Mitchell [phone]) showed a superficial spreading malignant melanoma, with Breslow depth 1.45 mm, Clark's level IV. There was brisk host response and 8 mitoses per high power field were noted. There was focal regression but no definitive ulceration. There was no satellitosis. There was no vascular and urgent. Margins were positive, but cleared by wide excision under Dr Tanya Mitchell 10/10/2011. There was some residual malignant melanoma  in situ but margins to 2 cm were obtained, with advancement flap closure of the skin defect. In addition left axillary sentinel lymph node mapping was performed showing metastatic melanoma in the single lymph node removed.  Tanya Mitchell sought a second opinion at [facility] under Dr Tanya Mitchell. There was a full discussion regarding  completion nodal resection, use of adjuvant interferon, and BRAF testing with a view to participation in a research protocol then available. The patient considered all these options and after much discussion the decision was made not to pursue full nodal dissection, partly because the area in question had a ready undergone surgery and radiation. The patient opted against interferon adjuvant therapy because of its very marginal benefit and significant side effects. She declined consideration of a research study and BRAF testing was not performed.  Her subsequent history is as detailed below.   PAST MEDICAL HISTORY: Past Medical History:  Diagnosis Date  . Breast cancer ([place]) 1995 and 2007  . Cardiomyopathy secondary to chemotherapy [facility])    has improved   . Hyperlipidemia   . Hypertension    under control, has been on med. x 3 yrs.  . Melanoma in situ of back ([place]) 09/2011   left  . Personal history of chemotherapy   . Personal history of radiation therapy   . Skin cancer 2013   Melanoma    PAST SURGICAL HISTORY: Past Surgical History:  Procedure Laterality Date  . BREAST LUMPECTOMY  12/21/2005   right  . MASTECTOMY  1995   with breast reconstruction - left    FAMILY HISTORY Family History  Problem Relation Age of Onset  . Lung cancer Father   . Cancer Father        lung  . Autism Son   . Breast cancer Daughter    the patient's father died from complications of lung cancer at the age of 35. He was a heavy smoker. The patient's mother died at the age of 56. Tanya Mitchell had no siblings. She underwent genetic testing for breast and ovarian cancer panel April of 2014. There were no demonstrable mutations in the BRCA or the other genes in the panel   GYNECOLOGIC HISTORY:  Menarche age 23, first live birth age 72. She is GX P2. She entered menopause at age 43, when she received her chemotherapy. She did not use hormone replacement. She did use birth control remotely, for approximately 14  years, with no complications.   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  She also volunteers at the [facility]. Tanya Mitchell [employer]. 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 Tanya Mitchell's son Tanya Mitchell is handicapped, and works as an Training and development officer. 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 Tanya Mitchell has 3 grandchildren and Tanya Mitchell 2. 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 Smoking status: Former Smoker    Quit date: 02/14/1964    Years since quitting: 55.0  . Smokeless tobacco: Never Used  Substance Use Topics  . Alcohol use: Yes    Comment: daily glass of wine  . Drug use: No     Colonoscopy: 2011  PAP: November 2014  Bone density: January 2013, osteopenia  Lipid panel:   August 2013/Dr. Barnes   No Known Allergies  Current Outpatient Medications  Medication  Sig Dispense Refill  . aspirin EC 81 MG tablet Take 1 tablet (81 mg total) by mouth daily.    [name] atorvastatin (LIPITOR) 80 MG tablet Take 1 tablet (80 mg total) by mouth daily. 90 tablet 3  . carvedilol (COREG) 25 MG tablet Take 1 tablet (25 mg total) by mouth 2 (two) times daily. 180 tablet 3  . cholecalciferol (VITAMIN D) 1000 UNITS tablet Take 2,000 Units by mouth daily.     . metFORMIN (GLUCOPHAGE) 500 MG tablet 250 mg twice a day.    . ramipril (ALTACE) 10 MG capsule TAKE ONE CAPSULE BY MOUTH DAILY 90 capsule 3   No current facility-administered medications for this visit.    OBJECTIVE: Middle-aged white woman who appears younger than stated age  60:   02/27/19 1301  BP: 130/63  Pulse: 73  Resp: 18  Temp: (!) 97.4 F (36.3 C)  SpO2: 100%     Body mass index is 23.58 kg/m.    ECOG FS:1 - Symptomatic but completely ambulatory Filed Weights   02/27/19 1301  Weight:  128 lb 14.4 oz (58.5 kg)    Sclerae unicteric, EOMs intact Wearing a mask No cervical or supraclavicular adenopathy Lungs no rales or rhonchi Heart regular rate and rhythm Abd soft, nontender, positive bowel sounds MSK no focal spinal tenderness, no upper extremity lymphedema Neuro: nonfocal, well oriented, appropriate affect Breasts: The right breast is unremarkable.  The left breast is status post mastectomy and reconstruction.  There is no evidence of local recurrence.  Both axillae are benign   LAB RESULTS:   Lab Results  Component Value Date   WBC 19.2 (H) 02/27/2019   NEUTROABS 15.3 (H) 02/27/2019   HGB 12.6 02/27/2019   HCT 36.5 02/27/2019   MCV 85.7 02/27/2019   PLT 475 (H) 02/27/2019      Chemistry      Component Value Date/Time   NA 142 11/28/2018 1603   NA 139 11/30/2015 1225   K 5.2 11/28/2018 1603   K 4.6 11/30/2015 1225   CL 102 11/28/2018 1603   CL 104 01/18/2012 0948   CO2 26 11/28/2018 1603   CO2 27 11/30/2015 1225   BUN 14 11/28/2018 1603   BUN 13.1 11/30/2015 1225   CREATININE 0.63 11/28/2018 1603   CREATININE 0.7 11/30/2015 1225      Component Value Date/Time   CALCIUM 10.1 11/28/2018 1603   CALCIUM 10.0 11/30/2015 1225   ALKPHOS 83 11/30/2015 1225   AST 25 11/30/2015 1225   ALT 27 11/30/2015 1225   BILITOT 0.60 11/30/2015 1225      STUDIES: No results found.   ASSESSMENT: 74 y.o. BRCA negative [place] woman   (1) status post left mastectomy with TRAM reconstruction June of 1995 for a T1c N1, stage IIA invasive ductal carcinoma, grade 2, estrogen receptor 97% positive, progesterone receptor 45% positive, treated adjuvantly according to CALGB 9394 with 3 cycles of doxorubicin followed by 3 cycles of cyclophosphamide, then tamoxifen for 5 years, off therapy completed November of 2000  (2) status post right lumpectomy October 2007 for high-grade ductal carcinoma in situ, with negative margins estrogen and progesterone receptor negative,  followed by adjuvant radiation therapy   (3) malignant melanoma, T2a N1a = stage IIIA, as follows:  (a) status post shave biopsy from the right upper back 09/15/2011 for a superficial spreading melanoma, Breslow depth 1.45 mm, Clark's level IV, with focal regression but no ulceration.  (b) status post wide excision and sentinel lymph node sampling  10/10/2011 with residual malignant melanoma in situ, but negative margins; the single sentinel lymph node was involved by melanoma with the largest subcapsular deposit measuring 0.22 mm, no evidence of capsular involvement or extracapsular extension  (c) completion nodal dissection was discussed, but not performed in part because the patient had already had a complete left axillary lymph node dissection at the time of her 1995 left mastectomy (10 lymph nodes were removed at that time)  (d) adjuvant interferon was discussed with the patient when she visited [facility] in September 2013, but given that it side effects and marginal benefits the patient declined  (e) BRAF testing has not been done on the original tumor; this was also discussed with the patient at the time of her [facility] visit, as it might possibly lead to participation in a research protocol; the patient decided not to pursue that option  (4) PET scan 11/19/18/2015 shows nonspecific uptake only at T6.   (a) MRI of the thoracic spine November 2015 showed no evidence of metastatic disease  (b) repeat PET scan 12/02/2014 shows no residual or recurrent hypermetabolic tumor.  (5) thrombocytosis first noted October 2017, leukocytosis first noted November 2019  (a) bone marrow biopsy 04/03/2018 showed a hypercellular bone marrow with granulocytic and megakaryocytic proliferation, some of the megakaryocytes being small and/or hypolobulated.  There was no increase in blasts, no significant increase in reticulin fibers  (b) cytogenetics from bone marrow biopsy 04/03/2018 showed 46,XX[20].nuc ish (ABL1, BCR)x2  (c)  molecular studies 12/18/2017 showed no mutations in JAK2 exons 12 and 14 (including V 617);  MPL exon 10 or CALR exon 9  (d) BCR/ABL 1 drawn 12/18/2017 showed 94% normal nuclei (6% with single fusion)  (e) repeat BCR/ABL 1 on 04/30/2018 showed 100% normal nuclei  (f) repeat BCR/ABL 1 on 02/27/2019  PLAN: Taron is now just about 20 years out from her left mastectomy for an invasive ductal carcinoma, with no evidence of recurrence.  She is also status post right lumpectomy 13-1/2 years ago for ductal carcinoma in situ.  Again there has been no evidence of disease recurrence.  As noted previously we would have been comfortable releasing jail from follow-up except that she did develop a leukocytosis beginning November 2019, with extensive work-up as detailed above not showing a definitive diagnosis.  An early myeloproliferative syndrome is suspected.  We reviewed her counts today.  The white cell counts are up but there is no clear trend.  The platelets are actually lower.  Mostly what we are seeing is neutrophils and if she proves to have a GI tract infection that might be a sufficient explanation at least for now.  In any case we are following her labs every 4 months.  She already has an appointment with Tanya Mitchell at [facility] in May.  She will return to see me in late August after her right mammography.  She knows to call for any other issue that may develop before the next visit  Total encounter time 35 minutes.Chauncey Cruel, MD   02/27/2019 1:31 PM  Oncology and Hematology [facility] [place] Tel. [phone]  Joylene Igo [phone]   I, Wilburn Mylar, am acting as scribe for Dr. Virgie Mitchell. Tanya Mitchell.  I, Lurline Del MD, have reviewed the above documentation for accuracy and completeness, and I agree with the above.    *Total Encounter Time as defined by the Centers for Medicare and Medicaid Services includes, in addition to the face-to-face time  of  a patient visit (documented in the note above) non-face-to-face time: obtaining and reviewing outside history, ordering and reviewing medications, tests or procedures, care coordination (communications with other health care professionals or caregivers) and documentation in the medical record.

## 2019-02-27 ENCOUNTER — Inpatient Hospital Stay: Payer: Medicare Other | Admitting: Oncology

## 2019-02-27 ENCOUNTER — Other Ambulatory Visit: Payer: Self-pay

## 2019-02-27 ENCOUNTER — Inpatient Hospital Stay: Payer: Medicare Other | Attending: Oncology

## 2019-02-27 VITALS — BP 130/63 | HR 73 | Temp 97.4°F | Resp 18 | Ht 62.0 in | Wt 128.9 lb

## 2019-02-27 DIAGNOSIS — D473 Essential (hemorrhagic) thrombocythemia: Secondary | ICD-10-CM | POA: Diagnosis not present

## 2019-02-27 DIAGNOSIS — D72829 Elevated white blood cell count, unspecified: Secondary | ICD-10-CM | POA: Insufficient documentation

## 2019-02-27 DIAGNOSIS — Z853 Personal history of malignant neoplasm of breast: Secondary | ICD-10-CM | POA: Insufficient documentation

## 2019-02-27 DIAGNOSIS — M8580 Other specified disorders of bone density and structure, unspecified site: Secondary | ICD-10-CM | POA: Diagnosis not present

## 2019-02-27 DIAGNOSIS — C4359 Malignant melanoma of other part of trunk: Secondary | ICD-10-CM | POA: Diagnosis not present

## 2019-02-27 DIAGNOSIS — Z87891 Personal history of nicotine dependence: Secondary | ICD-10-CM | POA: Insufficient documentation

## 2019-02-27 DIAGNOSIS — Z86000 Personal history of in-situ neoplasm of breast: Secondary | ICD-10-CM | POA: Insufficient documentation

## 2019-02-27 DIAGNOSIS — D0511 Intraductal carcinoma in situ of right breast: Secondary | ICD-10-CM | POA: Diagnosis not present

## 2019-02-27 DIAGNOSIS — D72820 Lymphocytosis (symptomatic): Secondary | ICD-10-CM

## 2019-02-27 DIAGNOSIS — R197 Diarrhea, unspecified: Secondary | ICD-10-CM | POA: Insufficient documentation

## 2019-02-27 DIAGNOSIS — D75839 Thrombocytosis, unspecified: Secondary | ICD-10-CM

## 2019-02-27 DIAGNOSIS — D72828 Other elevated white blood cell count: Secondary | ICD-10-CM

## 2019-02-27 DIAGNOSIS — Z85820 Personal history of malignant melanoma of skin: Secondary | ICD-10-CM | POA: Insufficient documentation

## 2019-02-27 LAB — CBC WITH DIFFERENTIAL/PLATELET
Abs Immature Granulocytes: 0.11 10*3/uL — ABNORMAL HIGH (ref 0.00–0.07)
Basophils Absolute: 0.1 10*3/uL (ref 0.0–0.1)
Basophils Relative: 1 %
Eosinophils Absolute: 0.5 10*3/uL (ref 0.0–0.5)
Eosinophils Relative: 3 %
HCT: 36.5 % (ref 36.0–46.0)
Hemoglobin: 12.6 g/dL (ref 12.0–15.0)
Immature Granulocytes: 1 %
Lymphocytes Relative: 15 %
Lymphs Abs: 2.9 10*3/uL (ref 0.7–4.0)
MCH: 29.6 pg (ref 26.0–34.0)
MCHC: 34.5 g/dL (ref 30.0–36.0)
MCV: 85.7 fL (ref 80.0–100.0)
Monocytes Absolute: 0.3 10*3/uL (ref 0.1–1.0)
Monocytes Relative: 2 %
Neutro Abs: 15.3 10*3/uL — ABNORMAL HIGH (ref 1.7–7.7)
Neutrophils Relative %: 78 %
Platelets: 475 10*3/uL — ABNORMAL HIGH (ref 150–400)
RBC: 4.26 MIL/uL (ref 3.87–5.11)
RDW: 14.4 % (ref 11.5–15.5)
WBC: 19.2 10*3/uL — ABNORMAL HIGH (ref 4.0–10.5)
nRBC: 0 % (ref 0.0–0.2)

## 2019-03-07 LAB — BCR/ABL

## 2019-03-10 ENCOUNTER — Other Ambulatory Visit: Payer: Self-pay | Admitting: Oncology

## 2019-03-10 NOTE — Progress Notes (Signed)
BCR/ABL 1 not detected on probe 02/27/2019

## 2019-03-13 ENCOUNTER — Ambulatory Visit: Payer: Medicare Other

## 2019-03-13 DIAGNOSIS — R197 Diarrhea, unspecified: Secondary | ICD-10-CM | POA: Diagnosis not present

## 2019-03-22 ENCOUNTER — Ambulatory Visit: Payer: Medicare Other | Attending: Internal Medicine

## 2019-03-22 DIAGNOSIS — Z23 Encounter for immunization: Secondary | ICD-10-CM | POA: Insufficient documentation

## 2019-03-22 NOTE — Progress Notes (Signed)
   Covid-19 Vaccination Clinic  Name:  Tanya Mitchell    MRN: [id number] DOB: 12-02-1945  03/22/2019  [name] was observed post Covid-19 immunization for 15 minutes without incidence. She was provided with Vaccine Information Sheet and instruction to access the V-Safe system.   [name] was instructed to call 911 with any severe reactions post vaccine: [name] Difficulty breathing  . Swelling of your face and throat  . A fast heartbeat  . A bad rash all over your body  . Dizziness and weakness    Immunizations Administered    Name Date Dose VIS Date Route   Pfizer COVID-19 Vaccine 03/22/2019  9:21 AM 0.3 mL 01/24/2019 Intramuscular   Manufacturer: [place]   Lot: [id number]   [place]: [id number]

## 2019-03-24 ENCOUNTER — Ambulatory Visit: Payer: Medicare Other

## 2019-03-30 ENCOUNTER — Other Ambulatory Visit: Payer: Self-pay | Admitting: Nurse Practitioner

## 2019-03-31 DIAGNOSIS — Z853 Personal history of malignant neoplasm of breast: Secondary | ICD-10-CM | POA: Diagnosis not present

## 2019-03-31 DIAGNOSIS — E7800 Pure hypercholesterolemia, unspecified: Secondary | ICD-10-CM | POA: Diagnosis not present

## 2019-03-31 DIAGNOSIS — I10 Essential (primary) hypertension: Secondary | ICD-10-CM | POA: Diagnosis not present

## 2019-03-31 DIAGNOSIS — E119 Type 2 diabetes mellitus without complications: Secondary | ICD-10-CM | POA: Diagnosis not present

## 2019-04-16 ENCOUNTER — Ambulatory Visit: Payer: Medicare Other | Attending: Internal Medicine

## 2019-04-16 DIAGNOSIS — Z23 Encounter for immunization: Secondary | ICD-10-CM | POA: Insufficient documentation

## 2019-04-16 NOTE — Progress Notes (Signed)
   Covid-19 Vaccination Clinic  Name:  Tanya Mitchell    MRN: [id number] DOB: 1945-12-19  04/16/2019  [name] was observed post Covid-19 immunization for 15 minutes without incident. She was provided with Vaccine Information Sheet and instruction to access the V-Safe system.   [name] was instructed to call 911 with any severe reactions post vaccine: [name] Difficulty breathing  . Swelling of face and throat  . A fast heartbeat  . A bad rash all over body  . Dizziness and weakness   Immunizations Administered    Name Date Dose VIS Date Route   Pfizer COVID-19 Vaccine 04/16/2019 10:11 AM 0.3 mL 01/24/2019 Intramuscular   Manufacturer: [place]   Lot: [id number]   [place]: [id number]

## 2019-04-23 DIAGNOSIS — Z85820 Personal history of malignant melanoma of skin: Secondary | ICD-10-CM | POA: Diagnosis not present

## 2019-04-23 DIAGNOSIS — D2262 Melanocytic nevi of left upper limb, including shoulder: Secondary | ICD-10-CM | POA: Diagnosis not present

## 2019-04-23 DIAGNOSIS — D2261 Melanocytic nevi of right upper limb, including shoulder: Secondary | ICD-10-CM | POA: Diagnosis not present

## 2019-04-23 DIAGNOSIS — L308 Other specified dermatitis: Secondary | ICD-10-CM | POA: Diagnosis not present

## 2019-04-28 ENCOUNTER — Other Ambulatory Visit: Payer: Self-pay | Admitting: Nurse Practitioner

## 2019-04-29 DIAGNOSIS — E7800 Pure hypercholesterolemia, unspecified: Secondary | ICD-10-CM | POA: Diagnosis not present

## 2019-04-29 DIAGNOSIS — E119 Type 2 diabetes mellitus without complications: Secondary | ICD-10-CM | POA: Diagnosis not present

## 2019-04-29 DIAGNOSIS — I10 Essential (primary) hypertension: Secondary | ICD-10-CM | POA: Diagnosis not present

## 2019-04-29 DIAGNOSIS — Z853 Personal history of malignant neoplasm of breast: Secondary | ICD-10-CM | POA: Diagnosis not present

## 2019-05-07 DIAGNOSIS — Z86010 Personal history of colonic polyps: Secondary | ICD-10-CM | POA: Diagnosis not present

## 2019-05-07 DIAGNOSIS — R197 Diarrhea, unspecified: Secondary | ICD-10-CM | POA: Diagnosis not present

## 2019-06-30 DIAGNOSIS — E119 Type 2 diabetes mellitus without complications: Secondary | ICD-10-CM | POA: Diagnosis not present

## 2019-06-30 DIAGNOSIS — I10 Essential (primary) hypertension: Secondary | ICD-10-CM | POA: Diagnosis not present

## 2019-06-30 DIAGNOSIS — E7800 Pure hypercholesterolemia, unspecified: Secondary | ICD-10-CM | POA: Diagnosis not present

## 2019-06-30 DIAGNOSIS — Z853 Personal history of malignant neoplasm of breast: Secondary | ICD-10-CM | POA: Diagnosis not present

## 2019-07-03 DIAGNOSIS — D72828 Other elevated white blood cell count: Secondary | ICD-10-CM | POA: Diagnosis not present

## 2019-07-03 DIAGNOSIS — Z85828 Personal history of other malignant neoplasm of skin: Secondary | ICD-10-CM | POA: Diagnosis not present

## 2019-07-03 DIAGNOSIS — E785 Hyperlipidemia, unspecified: Secondary | ICD-10-CM | POA: Diagnosis not present

## 2019-07-03 DIAGNOSIS — Z853 Personal history of malignant neoplasm of breast: Secondary | ICD-10-CM | POA: Diagnosis not present

## 2019-07-03 DIAGNOSIS — D72829 Elevated white blood cell count, unspecified: Secondary | ICD-10-CM | POA: Diagnosis not present

## 2019-07-03 DIAGNOSIS — Z9012 Acquired absence of left breast and nipple: Secondary | ICD-10-CM | POA: Diagnosis not present

## 2019-07-03 DIAGNOSIS — I427 Cardiomyopathy due to drug and external agent: Secondary | ICD-10-CM | POA: Diagnosis not present

## 2019-07-03 DIAGNOSIS — E119 Type 2 diabetes mellitus without complications: Secondary | ICD-10-CM | POA: Diagnosis not present

## 2019-07-03 DIAGNOSIS — D473 Essential (hemorrhagic) thrombocythemia: Secondary | ICD-10-CM | POA: Diagnosis not present

## 2019-07-03 DIAGNOSIS — I10 Essential (primary) hypertension: Secondary | ICD-10-CM | POA: Diagnosis not present

## 2019-07-03 DIAGNOSIS — Z79899 Other long term (current) drug therapy: Secondary | ICD-10-CM | POA: Diagnosis not present

## 2019-08-05 NOTE — Progress Notes (Signed)
CARDIOLOGY OFFICE NOTE  Date:  08/11/2019    Tanya Mitchell Date of Birth: 10-20-1945 Medical Record #[id number]  PCP:  Leighton Ruff, MD  Cardiologist:  Servando Snare    Chief Complaint  Patient presents with  . Follow-up    History of Present Illness: Tanya Mitchell is a 74 y.o. female who presents today for a 6 month check. She isa former patient ofDr. McLean's as well asDr. Tennant's.Primarily follows with me.  She has had a prior cardiomyopathy secondary to chemotherapy for breast cancer in the remote past. Her EF recovered.  Other issues include melanoma - now seeing Dr. Jana Hakim and Dr. Harriet Masson at [facility] for her melanoma.Labs are checked by PCP.She has HLD and DM as well.  Ihave followed her over the past several years - she has done well. Has had some issues with her blood counts - unknown as to why despite extensive work up. Echo was updated last year - EF has dropped some - we tried adding Aldactone but potassium was to high prior to starting - we have thus increased her Coreg. She has been busy with a new house build.  The patient does not have symptoms concerning for COVID-19 infection (fever, chills, cough, or new shortness of breath).   Comes in today. Here alone. She is doing well. No real complaints. Got moved into her new house finally. No chest pain. Breathing is good. Not dizzy or lightheaded. Weight is stable - down a few pounds. No swelling. She is very happy with how she is doing. She has not had recent lipids - she is not fasting today but can get thru her PCP. Still no answer as to why her WBC count is elevated.   Past Medical History:  Diagnosis Date  . Breast cancer ([place]) 1995 and 2007  . Cardiomyopathy secondary to chemotherapy [facility])    has improved   . Hyperlipidemia   . Hypertension    under control, has been on med. x 3 yrs.  . Melanoma in situ of back ([place]) 09/2011   left  . Personal history of chemotherapy   . Personal  history of radiation therapy   . Skin cancer 2013   Melanoma    Past Surgical History:  Procedure Laterality Date  . BREAST LUMPECTOMY  12/21/2005   right  . MASTECTOMY  1995   with breast reconstruction - left     Medications: Current Meds  Medication Sig  . aspirin EC 81 MG tablet Take 1 tablet (81 mg total) by mouth daily.  [name] atorvastatin (LIPITOR) 80 MG tablet Take 1 tablet (80 mg total) by mouth daily.  . carvedilol (COREG) 25 MG tablet Take 1 tablet (25 mg total) by mouth 2 (two) times daily.  . cholecalciferol (VITAMIN D) 1000 UNITS tablet Take 2,000 Units by mouth daily.   . hydrochlorothiazide (HYDRODIURIL) 25 MG tablet TAKE 1 TABLET BY MOUTH DAILY  . metFORMIN (GLUCOPHAGE) 500 MG tablet 250 mg twice a day.  . ramipril (ALTACE) 10 MG capsule TAKE ONE CAPSULE BY MOUTH DAILY     Allergies: No Known Allergies  Social History: The patient  reports that she quit smoking about 55 years ago. She has never used smokeless tobacco. She reports current alcohol use. She reports that she does not use drugs.   Family History: The patient's family history includes Autism in her son; Breast cancer in her daughter; Cancer in her father; Lung cancer in her father.   Review of Systems:  Please see the history of present illness.   All other systems are reviewed and negative.   Physical Exam: VS:  BP 130/80   Pulse 75   Ht 5\' 2"  (1.575 m)   Wt 125 lb 6.4 oz (56.9 kg)   SpO2 99%   BMI 22.94 kg/m  .  BMI Body mass index is 22.94 kg/m.  Wt Readings from Last 3 Encounters:  08/11/19 125 lb 6.4 oz (56.9 kg)  02/27/19 128 lb 14.4 oz (58.5 kg)  01/21/19 129 lb 12.8 oz (58.9 kg)    General: Pleasant. Alert and in no acute distress.  Weight is down a few pounds.  Cardiac: Regular rate and rhythm. No murmurs, rubs, or gallops. No edema.  Respiratory:  Lungs are clear to auscultation bilaterally with normal work of breathing.  GI: Soft and nontender.  MS: No deformity or atrophy.  Gait and ROM intact.  Skin: Warm and dry. Color is normal.  Neuro:  Strength and sensation are intact and no gross focal deficits noted.  Psych: Alert, appropriate and with normal affect.   LABORATORY DATA:  EKG:  EKG is not ordered today.    Lab Results  Component Value Date   WBC 19.2 (H) 02/27/2019   HGB 12.6 02/27/2019   HCT 36.5 02/27/2019   PLT 475 (H) 02/27/2019   GLUCOSE 126 (H) 11/28/2018   ALT 27 11/30/2015   AST 25 11/30/2015   NA 142 11/28/2018   K 5.2 11/28/2018   CL 102 11/28/2018   CREATININE 0.63 11/28/2018   BUN 14 11/28/2018   CO2 26 11/28/2018       BNP (last 3 results) No results for input(s): BNP in the last 8760 hours.  ProBNP (last 3 results) No results for input(s): PROBNP in the last 8760 hours.   Other Studies Reviewed Today:  ECHO IMPRESSIONS 10/2018  1. Left ventricular ejection fraction, by visual estimation, is 45 to 50%. The left ventricle has mildly decreased function. Normal left ventricular size. There is no left ventricular hypertrophy. 2. Elevated left ventricular end-diastolic pressure. 3. Left ventricular diastolic Doppler parameters are consistent with impaired relaxation pattern of LV diastolic filling. 4. Global longitudinal strain abnormal (-11.5%). Very slightly worse than 10/2017. 5. Global right ventricle has normal systolic function.The right ventricular size is normal. No increase in right ventricular wall thickness. 6. Left atrial size was moderately dilated. 7. Right atrial size was normal. 8. The mitral valve is normal in structure. Trace mitral valve regurgitation. No evidence of mitral stenosis. 9. The tricuspid valve is normal in structure. Tricuspid valve regurgitation is mild. 10. The aortic valve is normal in structure. Aortic valve regurgitation was not visualized by color flow Doppler. Structurally normal aortic valve, with no evidence of sclerosis or stenosis. 11. The pulmonic valve was normal in  structure. Pulmonic valve regurgitation is trivial by color flow Doppler. 12. Mildly elevated pulmonary artery systolic pressure. 13. The inferior vena cava is normal in size with greater than 50% respiratory variability, suggesting right atrial pressure of 3 mmHg.  EchoStudy Conclusions9/2019  - Left ventricle: The cavity size was normal. Wall thickness was normal. Systolic function was normal. The estimated ejection fraction was in the range of 50% to 55%. Diffusely abnormal GLS at -13%. Wall motion was normal; there were no regional wall motion abnormalities. Doppler parameters are consistent with abnormal left ventricular relaxation (grade 1 diastolic dysfunction). The E/e&' ratio is between 8-15, suggesting indeterminate LV filling pressure. - Mitral valve: Mildly thickened leaflets .  There was trivial regurgitation. - Left atrium: The atrium was normal in size. - Inferior vena cava: The vessel was normal in size. The respirophasic diameter changes were in the normal range (>= 50%), consistent with normal central venous pressure.  Impressions:  - Low normal LVEF 50-55%, diffusely abnormal GLS at -13%, grade 1 DD, indeterminate LV filling pressure, normal LA size.   Notes recorded by Burtis Junes, NP on 11/05/2017 at 2:23 PM EDT  I reviewed this study with Dr. Meda Coffee last week - I have reported this to [facility] myself - we will plan to repeat this study in one year. Discussed the GLS - unknown if this is a reflection from the past or not. [place]e remains without symptoms.    EchoStudy Conclusionsfrom 2014  - Left ventricle: The cavity size was normal. Wall thickness was normal. The estimated ejection fraction was 55%. - Mitral valve: Mild regurgitation. - Atrial septum: No defect or patent foramen ovale was identified.  Assessment/Plan:  1. History of NICM - she has had slight drop in her EF - last echo noted - she has also had  abnormal GLS score - I have talked with Dr. Meda Coffee about this in the past - unclear if this was a reflection of past LV dysfunction or what is to come. She is not able to have Aldactone due to hyperkalemia. She is on max dose of Coreg and ACE. Could consider Entresto but she is currently NYHA I - will need to follow.  2. HLD - on statin - she is going to try and get her labs thru her PCP.  3. HTN - BP is fine here today - no changes made - would stay on Coreg and Altace.   4. Melanoma/breast cancer - follows with Oncology - sees them next month.   5. Elevated WBC and platelet count - unknown etiology despite extensive work up.   6. History of PVCs - resolved.   Current medicines are reviewed with the patient today.  The patient does not have concerns regarding medicines other than what has been noted above.  The following changes have been made:  See above.  Labs/ tests ordered today include:   No orders of the defined types were placed in this encounter.    Disposition:   FU with me in 6 months.   Patient is agreeable to this plan and will call if any problems develop in the interim.   SignedTruitt Merle, NP  08/11/2019 3:38 PM  [facility] [address] [place], [place]  [address] Phone: [phone] Fax: [phone]

## 2019-08-11 ENCOUNTER — Encounter: Payer: Self-pay | Admitting: Nurse Practitioner

## 2019-08-11 ENCOUNTER — Ambulatory Visit: Payer: Medicare Other | Admitting: Nurse Practitioner

## 2019-08-11 ENCOUNTER — Other Ambulatory Visit: Payer: Self-pay

## 2019-08-11 VITALS — BP 130/80 | HR 75 | Ht 62.0 in | Wt 125.4 lb

## 2019-08-11 DIAGNOSIS — I10 Essential (primary) hypertension: Secondary | ICD-10-CM | POA: Diagnosis not present

## 2019-08-11 DIAGNOSIS — I5022 Chronic systolic (congestive) heart failure: Secondary | ICD-10-CM | POA: Diagnosis not present

## 2019-08-11 DIAGNOSIS — E782 Mixed hyperlipidemia: Secondary | ICD-10-CM

## 2019-08-11 DIAGNOSIS — I428 Other cardiomyopathies: Secondary | ICD-10-CM

## 2019-08-11 NOTE — Patient Instructions (Addendum)
After Visit Summary:  We will be checking the following labs today - NONE   Medication Instructions:    Continue with your current medicines.    If you need a refill on your cardiac medications before your next appointment, please call your pharmacy.     Testing/Procedures To Be Arranged:  N/A  Follow-Up:   See me in 6 months    At [facility], you and your health needs are our priority.  As part of our continuing mission to provide you with exceptional heart care, we have created designated Provider Care Teams.  These Care Teams include your primary Cardiologist (physician) and Advanced Practice Providers (APPs -  Physician Assistants and Nurse Practitioners) who all work together to provide you with the care you need, when you need it.  Special Instructions:  . Stay safe, wash your hands for at least 20 seconds and wear a mask when needed.  . It was good to talk with you today.    Call the [facility] office at [phone] if you have any questions, problems or concerns.       

## 2019-08-13 ENCOUNTER — Other Ambulatory Visit: Payer: Self-pay | Admitting: Oncology

## 2019-08-13 DIAGNOSIS — D72820 Lymphocytosis (symptomatic): Secondary | ICD-10-CM

## 2019-08-13 DIAGNOSIS — C4359 Malignant melanoma of other part of trunk: Secondary | ICD-10-CM

## 2019-08-13 DIAGNOSIS — D0511 Intraductal carcinoma in situ of right breast: Secondary | ICD-10-CM

## 2019-08-25 DIAGNOSIS — L237 Allergic contact dermatitis due to plants, except food: Secondary | ICD-10-CM | POA: Diagnosis not present

## 2019-09-01 ENCOUNTER — Telehealth: Payer: Self-pay | Admitting: Oncology

## 2019-09-01 NOTE — Telephone Encounter (Signed)
Scheduled per pt req 7/19, pt requested appt reschedule, called and spoke with pt, confirmed 9/1 appts

## 2019-09-25 DIAGNOSIS — Z20822 Contact with and (suspected) exposure to covid-19: Secondary | ICD-10-CM | POA: Diagnosis not present

## 2019-10-07 ENCOUNTER — Other Ambulatory Visit: Payer: Medicare Other

## 2019-10-07 ENCOUNTER — Ambulatory Visit: Payer: Medicare Other | Admitting: Oncology

## 2019-10-13 ENCOUNTER — Other Ambulatory Visit: Payer: Self-pay

## 2019-10-13 ENCOUNTER — Ambulatory Visit
Admission: RE | Admit: 2019-10-13 | Discharge: 2019-10-13 | Disposition: A | Discharge status: Home / Self Care | Payer: Medicare Other | Source: Ambulatory Visit | Attending: Oncology | Admitting: Oncology

## 2019-10-13 DIAGNOSIS — C4359 Malignant melanoma of other part of trunk: Secondary | ICD-10-CM

## 2019-10-13 DIAGNOSIS — D72820 Lymphocytosis (symptomatic): Secondary | ICD-10-CM

## 2019-10-13 DIAGNOSIS — Z1231 Encounter for screening mammogram for malignant neoplasm of breast: Secondary | ICD-10-CM | POA: Diagnosis not present

## 2019-10-13 DIAGNOSIS — D0511 Intraductal carcinoma in situ of right breast: Secondary | ICD-10-CM

## 2019-10-14 NOTE — Progress Notes (Signed)
ID: Tanya Mitchell OB: 1945/06/13  MR#: [id number]  CSN#:[id number]  Patient Care Team: Tanya Ruff, MD as PCP - General (Family Medicine) Tanya Junes, NP as Nurse Practitioner (Nurse Practitioner) Tanya Mitchell, Tanya Dad, MD as Consulting Physician (Oncology) Tanya Charleston, MD as Consulting Physician (Obstetrics and Gynecology) Tanya Contes, MD as Referring Physician (Hematology and Oncology) OTHER MD:  CHIEF COMPLAINTS:   1)  Hx Left Breast Cancer 1995 (s/p left mastectomy)       2) Hx Right Breast Cancer 2007                   3)  Hx Melanoma, Stage IIIA       4) leukocytosis/thrombocytosis      CURRENT TREATMENT: observation   INTERVAL HISTORY: Tanya Mitchell returns today for follow-up of her history of breast cancer.  Since her last visit, she underwent right screening mammography with tomography at [facility] on 10/13/2019 showing: breast density category B; no evidence of malignancy in either breast.   REVIEW OF SYSTEMS: Tanya Mitchell is entirely asymptomatic, travel to the [place] recently, exercises regularly, and has had her Covid vaccines.  A detailed review of systems today was stable  HISTORY OF PRESENT ILLNESS: From the earlier summary:  Tanya Mitchell's history of breast cancer dates back to June of 1995, when she had left modified radical mastectomy under Tanya Mitchell for a pT1c pN1, stage IIA invasive ductal carcinoma, grade 2, estrogen receptor 97% positive, progesterone receptor 45% positive, treated according to CALGB 9394, sequential arm (doxorubicin x3, fourth dose apparently omitted, followed by high-dose cyclophosphamide x3), followed by tamoxifen for 5 years. There has been no evidence of disease recurrence.  Further, in October of 2007 she underwent right lumpectomy for a ductal carcinoma in situ, high-grade, estrogen and progesterone receptor negative, followed by adjuvant radiation.  In July of 2013 the patient was noted to have an irregular mole in her  left upper back. It is not clear to me whether this might be related to her prior left breast irradiation. Shave biopsy of this area 09/15/2011 by Dr. Derrel Mitchell [phone]) showed a superficial spreading malignant melanoma, with Breslow depth 1.45 mm, Clark's level IV. There was brisk host response and 8 mitoses per high power field were noted. There was focal regression but no definitive ulceration. There was no satellitosis. There was no vascular and urgent. Margins were positive, but cleared by wide excision under Dr Tanya Mitchell 10/10/2011. There was some residual malignant melanoma in situ but margins to 2 cm were obtained, with advancement flap closure of the skin defect. In addition left axillary sentinel lymph node mapping was performed showing metastatic melanoma in the single lymph node removed.  Tanya Mitchell sought a second opinion at [facility] under Dr Tanya Mitchell. There was a full discussion regarding completion nodal resection, use of adjuvant interferon, and BRAF testing with a view to participation in a research protocol then available. The patient considered all these options and after much discussion the decision was made not to pursue full nodal dissection, partly because the area in question had a ready undergone surgery and radiation. The patient opted against interferon adjuvant therapy because of its very marginal benefit and significant side effects. She declined consideration of a research study and BRAF testing was not performed.  Her subsequent history is as detailed below.   PAST MEDICAL HISTORY: Past Medical History:  Diagnosis Date  . Breast cancer ([place]) 1995 and 2007  . Cardiomyopathy secondary to chemotherapy ([place])  has improved   . Hyperlipidemia   . Hypertension    under control, has been on med. x 3 yrs.  . Melanoma in situ of back ([place]) 09/2011   left  . Personal history of chemotherapy   . Personal history of radiation therapy   . Skin cancer 2013   Melanoma     PAST SURGICAL HISTORY: Past Surgical History:  Procedure Laterality Date  . BREAST LUMPECTOMY  12/21/2005   right  . MASTECTOMY  1995   with breast reconstruction - left    FAMILY HISTORY Family History  Problem Relation Age of Onset  . Lung cancer Father   . Cancer Father        lung  . Autism Tanya Mitchell   . Breast cancer Daughter    the patient's father died from complications of lung cancer at the age of 45. He was a heavy smoker. The patient's mother died at the age of 38. Tanya Mitchell had no siblings. She underwent genetic testing for breast and ovarian cancer panel April of 2014. There were no demonstrable mutations in the BRCA or the other genes in the panel   GYNECOLOGIC HISTORY:  Menarche age 29, first live birth age 35. She is GX P2. She entered menopause at age 40, when she received her chemotherapy. She did not use hormone replacement. She did use birth control remotely, for approximately 14 years, with no complications.   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  They recently me moved to a Tanya home in the [facility] area.  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 Tanya Mitchell has 3 grandchildren and Tanya Mitchell 2. Tanya Mitchell grew up in an Tanya Mitchell denomination   ADVANCED DIRECTIVES: In place   HEALTH MAINTENANCE:  Social History   Tobacco Use  . Smoking status: Former Smoker    Quit date: 02/14/1964    Years since quitting: 55.7  . Smokeless tobacco: Never Used  Vaping Use  . Vaping Use: Never used  Substance Use Topics  . Alcohol use: Yes    Comment: daily glass of wine  . Drug use: No     Colonoscopy: 2011  PAP: November 2014  Bone density: January  2013, osteopenia  Lipid panel:   August 2013/Dr. Barnes   No Known Allergies  Current Outpatient Medications  Medication Sig Dispense Refill  . aspirin EC 81 MG tablet Take 1 tablet (81 mg total) by mouth daily.    [name] atorvastatin (LIPITOR) 80 MG tablet Take 1 tablet (80 mg total) by mouth daily. 90 tablet 3  . carvedilol (COREG) 25 MG tablet Take 1 tablet (25 mg total) by mouth 2 (two) times daily. 180 tablet 3  . cholecalciferol (VITAMIN D) 1000 UNITS tablet Take 2,000 Units by mouth daily.     . hydrochlorothiazide (HYDRODIURIL) 25 MG tablet TAKE 1 TABLET BY MOUTH DAILY 90 tablet 3  . metFORMIN (GLUCOPHAGE) 500 MG tablet 250 mg twice a day.    . ramipril (ALTACE) 10 MG capsule TAKE ONE CAPSULE BY MOUTH DAILY 90 capsule 3   No current facility-administered medications for this visit.    OBJECTIVE: White woman who appears younger than stated age  35:   10/15/19  0900  BP: 128/63  Pulse: 74  Resp: 20  Temp: 97.6 F (36.4 C)  SpO2: 100%     Body mass index is 22.52 kg/m.    ECOG FS:1 - Symptomatic but completely ambulatory Filed Weights   10/15/19 0900  Weight: 123 lb 1.6 oz (55.8 kg)    Sclerae unicteric, EOMs intact Wearing a mask No cervical or supraclavicular adenopathy Lungs no rales or rhonchi Heart regular rate and rhythm Abd soft, nontender, positive bowel sounds MSK no focal spinal tenderness, no upper extremity lymphedema Neuro: nonfocal, well oriented, appropriate affect Breasts: The right breast is benign.  The left breast has undergone mastectomy followed by reconstruction with no evidence of recurrence.  Both axillae are benign.   LAB RESULTS:   Lab Results  Component Value Date   WBC 22.7 (H) 10/15/2019   NEUTROABS 16.6 (H) 10/15/2019   HGB 12.8 10/15/2019   HCT 37.3 10/15/2019   MCV 88.0 10/15/2019   PLT 556 (H) 10/15/2019      Chemistry      Component Value Date/Time   NA 142 11/28/2018 1603   NA 139 11/30/2015 1225   K 5.2 11/28/2018  1603   K 4.6 11/30/2015 1225   CL 102 11/28/2018 1603   CL 104 01/18/2012 0948   CO2 26 11/28/2018 1603   CO2 27 11/30/2015 1225   BUN 14 11/28/2018 1603   BUN 13.1 11/30/2015 1225   CREATININE 0.63 11/28/2018 1603   CREATININE 0.7 11/30/2015 1225      Component Value Date/Time   CALCIUM 10.1 11/28/2018 1603   CALCIUM 10.0 11/30/2015 1225   ALKPHOS 83 11/30/2015 1225   AST 25 11/30/2015 1225   ALT 27 11/30/2015 1225   BILITOT 0.60 11/30/2015 1225      STUDIES: MM 3D SCREEN BREAST UNI RIGHT  Result Date: 10/14/2019 CLINICAL DATA:  Screening. EXAM: DIGITAL SCREENING UNILATERAL RIGHT MAMMOGRAM WITH CAD AND TOMO COMPARISON:  Previous exam(s). ACR Breast Density Category b: There are scattered areas of fibroglandular density. FINDINGS: There are no findings suspicious for malignancy. Images were processed with CAD. IMPRESSION: No mammographic evidence of malignancy. A result letter of this screening mammogram will be mailed directly to the patient. RECOMMENDATION: Screening mammogram in one year. (Code:SM-B-01Y) BI-RADS CATEGORY  1: Negative. Electronically Signed   By: Abelardo Diesel M.D.   On: 10/14/2019 13:57     ASSESSMENT: 74 y.o. BRCA negative [place] woman   (1) status post left mastectomy with TRAM reconstruction June of 1995 for a T1c N1, stage IIA invasive ductal carcinoma, grade 2, estrogen receptor 97% positive, progesterone receptor 45% positive, treated adjuvantly according to CALGB 9394 with 3 cycles of doxorubicin followed by 3 cycles of cyclophosphamide, then tamoxifen for 5 years, off therapy completed November of 2000  (2) status post right lumpectomy October 2007 for high-grade ductal carcinoma in situ, with negative margins estrogen and progesterone receptor negative, followed by adjuvant radiation therapy   (3) malignant melanoma, T2a N1a = stage IIIA, as follows:  (a) status post shave biopsy from the right upper back 09/15/2011 for a superficial spreading  melanoma, Breslow depth 1.45 mm, Clark's level IV, with focal regression but no ulceration.  (b) status post wide excision and sentinel lymph node sampling 10/10/2011 with residual malignant melanoma in situ, but negative margins; the single sentinel lymph node was involved by melanoma with the largest subcapsular deposit measuring 0.22 mm, no evidence of capsular involvement or extracapsular extension  (c) completion nodal dissection was discussed,  but not performed in part because the patient had already had a complete left axillary lymph node dissection at the time of her 1995 left mastectomy (10 lymph nodes were removed at that time)  (d) adjuvant interferon was discussed with the patient when she visited [facility] in September 2013, but given that it side effects and marginal benefits the patient declined  (e) BRAF testing has not been done on the original tumor; this was also discussed with the patient at the time of her [facility] visit, as it might possibly lead to participation in a research protocol; the patient decided not to pursue that option  (4) PET scan 11/19/18/2015 shows nonspecific uptake only at T6.   (a) MRI of the thoracic spine November 2015 showed no evidence of metastatic disease  (b) repeat PET scan 12/02/2014 shows no residual or recurrent hypermetabolic tumor.  (5) thrombocytosis first noted October 2017, leukocytosis first noted November 2019  (a) bone marrow biopsy 04/03/2018 showed a hypercellular bone marrow with granulocytic and megakaryocytic proliferation, some of the megakaryocytes being small and/or hypolobulated.  There was no increase in blasts, no significant increase in reticulin fibers  (b) cytogenetics from bone marrow biopsy 04/03/2018 showed 46,XX[20].nuc ish (ABL1, BCR)x2  (c) molecular studies 12/18/2017 showed no mutations in JAK2 exons 12 and 14 (including V 617);  MPL exon 10 or CALR exon 9  (d) BCR/ABL 1 drawn 12/18/2017 showed 94% normal nuclei (6% with single  fusion)  (e) repeat BCR/ABL 1 on 04/30/2018 showed 100% normal nuclei  (f) repeat BCR/ABL 1 on 02/27/2019: Translocation not detected   PLAN: Tanya Mitchell is now 27 years out from her left mastectomy and 14 years out from her right lumpectomy with no evidence of breast cancer recurrence.  That is very favorable.  We have been following her counts because of concern regarding an emerging problem.  The counts continue to rise and now her white cell count is greater than 20,000.  Review of the peripheral blood film shows increasing left shift.  I think it is time for [place] to repeat a bone marrow biopsy.  [place] is agreeable.  We will get that set up here in the next couple of weeks.  I am setting her up for a virtual visit in about a month just to discuss those results after which we may resume routine follow-up  Total encounter time 30 minutes.Chauncey Cruel, MD   10/15/2019 7:16 PM  Oncology and Hematology [facility] [place], [place] [address] Tel. [phone]  Joylene Igo [phone]   I, Wilburn Mylar, am acting as scribe for Dr. Sarajane Jews C. Jode Lippe.  I, Lurline Del MD, have reviewed the above documentation for accuracy and completeness, and I agree with the above.   *Total Encounter Time as defined by the Centers for Medicare and Medicaid Services includes, in addition to the face-to-face time of a patient visit (documented in the note above) non-face-to-face time: obtaining and reviewing outside history, ordering and reviewing medications, tests or procedures, care coordination (communications with other health care professionals or caregivers) and documentation in the medical record.

## 2019-10-15 ENCOUNTER — Inpatient Hospital Stay: Payer: Medicare Other | Attending: Oncology

## 2019-10-15 ENCOUNTER — Inpatient Hospital Stay: Payer: Medicare Other | Admitting: Oncology

## 2019-10-15 ENCOUNTER — Other Ambulatory Visit: Payer: Self-pay

## 2019-10-15 VITALS — BP 128/63 | HR 74 | Temp 97.6°F | Resp 20 | Ht 62.0 in | Wt 123.1 lb

## 2019-10-15 DIAGNOSIS — Z853 Personal history of malignant neoplasm of breast: Secondary | ICD-10-CM | POA: Diagnosis not present

## 2019-10-15 DIAGNOSIS — Z923 Personal history of irradiation: Secondary | ICD-10-CM | POA: Diagnosis not present

## 2019-10-15 DIAGNOSIS — D72828 Other elevated white blood cell count: Secondary | ICD-10-CM

## 2019-10-15 DIAGNOSIS — Z87891 Personal history of nicotine dependence: Secondary | ICD-10-CM | POA: Diagnosis not present

## 2019-10-15 DIAGNOSIS — Z9221 Personal history of antineoplastic chemotherapy: Secondary | ICD-10-CM | POA: Insufficient documentation

## 2019-10-15 DIAGNOSIS — Z79899 Other long term (current) drug therapy: Secondary | ICD-10-CM | POA: Diagnosis not present

## 2019-10-15 DIAGNOSIS — Z85820 Personal history of malignant melanoma of skin: Secondary | ICD-10-CM | POA: Diagnosis not present

## 2019-10-15 DIAGNOSIS — D72829 Elevated white blood cell count, unspecified: Secondary | ICD-10-CM | POA: Insufficient documentation

## 2019-10-15 DIAGNOSIS — D0511 Intraductal carcinoma in situ of right breast: Secondary | ICD-10-CM | POA: Diagnosis not present

## 2019-10-15 DIAGNOSIS — C4359 Malignant melanoma of other part of trunk: Secondary | ICD-10-CM | POA: Diagnosis not present

## 2019-10-15 DIAGNOSIS — Z86000 Personal history of in-situ neoplasm of breast: Secondary | ICD-10-CM | POA: Diagnosis not present

## 2019-10-15 DIAGNOSIS — D75839 Thrombocytosis, unspecified: Secondary | ICD-10-CM

## 2019-10-15 LAB — CBC WITH DIFFERENTIAL/PLATELET
Abs Immature Granulocytes: 2.02 10*3/uL — ABNORMAL HIGH (ref 0.00–0.07)
Basophils Absolute: 0.2 10*3/uL — ABNORMAL HIGH (ref 0.0–0.1)
Basophils Relative: 1 %
Eosinophils Absolute: 0.5 10*3/uL (ref 0.0–0.5)
Eosinophils Relative: 2 %
HCT: 37.3 % (ref 36.0–46.0)
Hemoglobin: 12.8 g/dL (ref 12.0–15.0)
Immature Granulocytes: 9 %
Lymphocytes Relative: 14 %
Lymphs Abs: 3.2 10*3/uL (ref 0.7–4.0)
MCH: 30.2 pg (ref 26.0–34.0)
MCHC: 34.3 g/dL (ref 30.0–36.0)
MCV: 88 fL (ref 80.0–100.0)
Monocytes Absolute: 0.2 10*3/uL (ref 0.1–1.0)
Monocytes Relative: 1 %
Neutro Abs: 16.6 10*3/uL — ABNORMAL HIGH (ref 1.7–7.7)
Neutrophils Relative %: 73 %
Platelets: 556 10*3/uL — ABNORMAL HIGH (ref 150–400)
RBC: 4.24 MIL/uL (ref 3.87–5.11)
RDW: 14.6 % (ref 11.5–15.5)
WBC: 22.7 10*3/uL — ABNORMAL HIGH (ref 4.0–10.5)
nRBC: 0 % (ref 0.0–0.2)

## 2019-10-16 ENCOUNTER — Telehealth: Payer: Self-pay

## 2019-10-16 NOTE — Telephone Encounter (Signed)
Per Dr Jana Hakim, pt needs BMBX 9/17. Wilber Bihari, NP aware. Message sent to scheduling for biopsy and lab, and flowtometry aware as well. Pt understands she should arrive at [place] and is in agreement.

## 2019-10-24 DIAGNOSIS — R238 Other skin changes: Secondary | ICD-10-CM | POA: Diagnosis not present

## 2019-10-31 ENCOUNTER — Other Ambulatory Visit: Payer: Self-pay

## 2019-10-31 ENCOUNTER — Inpatient Hospital Stay: Payer: Medicare Other

## 2019-10-31 ENCOUNTER — Inpatient Hospital Stay (HOSPITAL_BASED_OUTPATIENT_CLINIC_OR_DEPARTMENT_OTHER): Payer: Medicare Other | Admitting: Adult Health

## 2019-10-31 VITALS — BP 139/81 | HR 70 | Temp 98.6°F | Resp 18

## 2019-10-31 DIAGNOSIS — D72820 Lymphocytosis (symptomatic): Secondary | ICD-10-CM | POA: Diagnosis not present

## 2019-10-31 DIAGNOSIS — Z86000 Personal history of in-situ neoplasm of breast: Secondary | ICD-10-CM | POA: Diagnosis not present

## 2019-10-31 DIAGNOSIS — Z853 Personal history of malignant neoplasm of breast: Secondary | ICD-10-CM | POA: Diagnosis not present

## 2019-10-31 DIAGNOSIS — Z9221 Personal history of antineoplastic chemotherapy: Secondary | ICD-10-CM | POA: Diagnosis not present

## 2019-10-31 DIAGNOSIS — D649 Anemia, unspecified: Secondary | ICD-10-CM | POA: Diagnosis not present

## 2019-10-31 DIAGNOSIS — D75839 Thrombocytosis, unspecified: Secondary | ICD-10-CM

## 2019-10-31 DIAGNOSIS — D72828 Other elevated white blood cell count: Secondary | ICD-10-CM

## 2019-10-31 DIAGNOSIS — D473 Essential (hemorrhagic) thrombocythemia: Secondary | ICD-10-CM

## 2019-10-31 DIAGNOSIS — Z85820 Personal history of malignant melanoma of skin: Secondary | ICD-10-CM | POA: Diagnosis not present

## 2019-10-31 DIAGNOSIS — Z79899 Other long term (current) drug therapy: Secondary | ICD-10-CM | POA: Diagnosis not present

## 2019-10-31 DIAGNOSIS — Z87891 Personal history of nicotine dependence: Secondary | ICD-10-CM | POA: Diagnosis not present

## 2019-10-31 DIAGNOSIS — Z923 Personal history of irradiation: Secondary | ICD-10-CM | POA: Diagnosis not present

## 2019-10-31 DIAGNOSIS — D72829 Elevated white blood cell count, unspecified: Secondary | ICD-10-CM | POA: Diagnosis not present

## 2019-10-31 LAB — CBC WITH DIFFERENTIAL/PLATELET
Abs Immature Granulocytes: 4.98 10*3/uL — ABNORMAL HIGH (ref 0.00–0.07)
Basophils Absolute: 0.3 10*3/uL — ABNORMAL HIGH (ref 0.0–0.1)
Basophils Relative: 1 %
Eosinophils Absolute: 0.6 10*3/uL — ABNORMAL HIGH (ref 0.0–0.5)
Eosinophils Relative: 2 %
HCT: 34.4 % — ABNORMAL LOW (ref 36.0–46.0)
Hemoglobin: 11.8 g/dL — ABNORMAL LOW (ref 12.0–15.0)
Immature Granulocytes: 15 %
Lymphocytes Relative: 12 %
Lymphs Abs: 4.3 10*3/uL — ABNORMAL HIGH (ref 0.7–4.0)
MCH: 30.4 pg (ref 26.0–34.0)
MCHC: 34.3 g/dL (ref 30.0–36.0)
MCV: 88.7 fL (ref 80.0–100.0)
Monocytes Absolute: 0.5 10*3/uL (ref 0.1–1.0)
Monocytes Relative: 1 %
Neutro Abs: 23.6 10*3/uL — ABNORMAL HIGH (ref 1.7–7.7)
Neutrophils Relative %: 69 %
Platelets: 335 10*3/uL (ref 150–400)
RBC: 3.88 MIL/uL (ref 3.87–5.11)
RDW: 14.8 % (ref 11.5–15.5)
WBC: 34.2 10*3/uL — ABNORMAL HIGH (ref 4.0–10.5)
nRBC: 0 % (ref 0.0–0.2)

## 2019-10-31 MED ORDER — LIDOCAINE HCL 2 % IJ SOLN
INTRAMUSCULAR | Status: AC
  Filled 2019-10-31: qty 20

## 2019-10-31 NOTE — Progress Notes (Signed)
Dressing to bone marrow site clean, dry and intact. No drainage noted.

## 2019-10-31 NOTE — Progress Notes (Signed)
INDICATION: leukocytosis  Brief examination was performed. ENT: adequate airway clearance Heart: regular rate and rhythm.No Murmurs Lungs: clear to auscultation, no wheezes, normal respiratory effort  Bone Marrow Biopsy and Aspiration Procedure Note   Informed consent was obtained and potential risks including bleeding, infection and pain were reviewed with the patient.  The patient's name, date of birth, identification, consent and allergies were verified prior to the start of procedure and time out was performed.  The left posterior iliac crest was chosen as the site of biopsy.  The skin was prepped with ChloraPrep.   8 cc of 2% lidocaine was used to provide local anaesthesia.   10 cc of bone marrow aspirate was obtained followed by 1cm biopsy.  Pressure was applied to the biopsy site and bandage was placed over the biopsy site. Patient was made to lie on the back for 30 mins prior to discharge.  The procedure was tolerated well. COMPLICATIONS: None BLOOD LOSS: none The patient was discharged home in stable condition with a 1 week follow up to review results.  Patient was provided with post bone marrow biopsy instructions and instructed to call if there was any bleeding or worsening pain.  Specimens sent for flow cytometry, cytogenetics and additional studies.  Signed Scot Dock, NP

## 2019-10-31 NOTE — Patient Instructions (Signed)
Bone Marrow Aspiration and Bone Marrow Biopsy, Adult, Care After This sheet gives you information about how to care for yourself after your procedure. Your health care provider may also give you more specific instructions. If you have problems or questions, contact your health care provider. What can I expect after the procedure? After the procedure, it is common to have:  Mild pain and tenderness.  Swelling.  Bruising. Follow these instructions at home: Puncture site care   Follow instructions from your health care provider about how to take care of the puncture site. Make sure you: ? Wash your hands with soap and water before and after you change your bandage (dressing). If soap and water are not available, use hand sanitizer. ? Change your dressing as told by your health care provider.  Check your puncture site every day for signs of infection. Check for: ? More redness, swelling, or pain. ? Fluid or blood. ? Warmth. ? Pus or a bad smell. Activity  Return to your normal activities as told by your health care provider. Ask your health care provider what activities are safe for you.  Do not lift anything that is heavier than 10 lb (4.5 kg), or the limit that you are told, until your health care provider says that it is safe.  Do not drive for 24 hours if you were given a sedative during your procedure. General instructions   Take over-the-counter and prescription medicines only as told by your health care provider.  Do not take baths, swim, or use a hot tub until your health care provider approves. Ask your health care provider if you may take showers. You may only be allowed to take sponge baths.  If directed, put ice on the affected area. To do this: ? Put ice in a plastic bag. ? Place a towel between your skin and the bag. ? Leave the ice on for 20 minutes, 2-3 times a day.  Keep all follow-up visits as told by your health care provider. This is important. Contact a  health care provider if:  Your pain is not controlled with medicine.  You have a fever.  You have more redness, swelling, or pain around the puncture site.  You have fluid or blood coming from the puncture site.  Your puncture site feels warm to the touch.  You have pus or a bad smell coming from the puncture site. Summary  After the procedure, it is common to have mild pain, tenderness, swelling, and bruising.  Follow instructions from your health care provider about how to take care of the puncture site and what activities are safe for you.  Take over-the-counter and prescription medicines only as told by your health care provider.  Contact a health care provider if you have any signs of infection, such as fluid or blood coming from the puncture site. This information is not intended to replace advice given to you by your health care provider. Make sure you discuss any questions you have with your health care provider. Document Revised: 06/18/2018 Document Reviewed: 06/18/2018 Elsevier Patient Education  2020 Elsevier Inc.  

## 2019-11-03 ENCOUNTER — Telehealth: Payer: Self-pay | Admitting: Adult Health

## 2019-11-03 NOTE — Telephone Encounter (Signed)
No 9/17 los. No changes made to pt's schedule.  

## 2019-11-04 ENCOUNTER — Telehealth: Payer: Self-pay

## 2019-11-04 LAB — SURGICAL PATHOLOGY

## 2019-11-04 NOTE — Telephone Encounter (Signed)
Pt called and LVM asking for BMBX results. Will consult with Dr Jana Hakim about this matter.

## 2019-11-05 ENCOUNTER — Other Ambulatory Visit: Payer: Self-pay | Admitting: Oncology

## 2019-11-05 NOTE — Progress Notes (Signed)
I called Nakeita and gave her the preliminary results from her bone marrow which show no evidence of transformation to acute leukemia.  We are essentially saying the same thing we saw before except a little "hotter".  We do have an NGS pending and hopefully we will have those results by the time we speak again 11/20/2019

## 2019-11-09 ENCOUNTER — Other Ambulatory Visit: Payer: Self-pay | Admitting: Nurse Practitioner

## 2019-11-10 ENCOUNTER — Encounter (HOSPITAL_COMMUNITY): Payer: Self-pay | Admitting: Oncology

## 2019-11-18 ENCOUNTER — Other Ambulatory Visit: Payer: Self-pay | Admitting: Nurse Practitioner

## 2019-11-19 NOTE — Progress Notes (Signed)
ID: Tanya Mitchell Verret OB: 1945/05/12  MR#: [id number]  CSN#:[id number]  Patient Care Team: Leighton Ruff, MD as PCP - General (Family Medicine) Burtis Junes, NP as Nurse Practitioner (Nurse Practitioner) Amiel Sharrow, Virgie Dad, MD as Consulting Physician (Oncology) Bobbye Charleston, MD as Consulting Physician (Obstetrics and Gynecology) Janyth Contes, MD as Referring Physician (Hematology and Oncology) OTHER MD:  I connected with Tanya Mitchell Baldi on 11/20/19 at  9:30 AM EDT by video enabled telemedicine visit and verified that I am speaking with the correct person using two identifiers.   I discussed the limitations, risks, security and privacy concerns of performing an evaluation and management service by telemedicine and the availability of in-person appointments. I also discussed with the patient that there may be a patient responsible charge related to this service. The patient expressed understanding and agreed to proceed.   Other persons participating in the visit and their role in the encounter: Patient's husband  Patient's location: Home Provider's location: [place]:   1)  Hx Left Breast Cancer 1995 (s/p left mastectomy)       2) Hx Right Breast Cancer 2007                   3)  Hx Melanoma, Stage IIIA       4) leukocytosis/thrombocytosis      CURRENT TREATMENT: observation   INTERVAL HISTORY: [name] was contacted today for follow-up of her history of breast cancer.  Her husband also participated in the video visit  Since her last visit, she underwent bone marrow biopsy on 10/31/2019. Pathology from the procedure ([id number]) showed: hypercellular marrow with myeloid and megakaryocytic hyperplasia. Peripheral blood showed: leukocytosis; normocytic anemia.   REVIEW OF SYSTEMS: Tanya Mitchell    HISTORY OF PRESENT ILLNESS: From the earlier summary:  [name]'s history of breast cancer dates back to June of 1995, when she had left  modified radical mastectomy under [facility] for a pT1c pN1, stage IIA invasive ductal carcinoma, grade 2, estrogen receptor 97% positive, progesterone receptor 45% positive, treated according to CALGB 9394, sequential arm (doxorubicin x3, fourth dose apparently omitted, followed by high-dose cyclophosphamide x3), followed by tamoxifen for 5 years. There has been no evidence of disease recurrence.  Further, in October of 2007 she underwent right lumpectomy for a ductal carcinoma in situ, high-grade, estrogen and progesterone receptor negative, followed by adjuvant radiation.  In July of 2013 the patient was noted to have an irregular mole in her left upper back. It is not clear to me whether this might be related to her prior left breast irradiation. Shave biopsy of this area 09/15/2011 by Dr. Derrel Nip [phone]) showed a superficial spreading malignant melanoma, with Breslow depth 1.45 mm, Clark's level IV. There was brisk host response and 8 mitoses per high power field were noted. There was focal regression but no definitive ulceration. There was no satellitosis. There was no vascular and urgent. Margins were positive, but cleared by wide excision under Dr Stark Klein 10/10/2011. There was some residual malignant melanoma in situ but margins to 2 cm were obtained, with advancement flap closure of the skin defect. In addition left axillary sentinel lymph node mapping was performed showing metastatic melanoma in the single lymph node removed.  [name] sought a second opinion at [facility] under Dr Joaquim Lai Collichio. There was a full discussion regarding completion nodal resection, use of adjuvant interferon, and BRAF testing with a view to participation in a research protocol then available.  The patient considered all these options and after much discussion the decision was made not to pursue full nodal dissection, partly because the area in question had a ready undergone surgery and radiation. The patient  opted against interferon adjuvant therapy because of its very marginal benefit and significant side effects. She declined consideration of a research study and BRAF testing was not performed.  Her subsequent history is as detailed below.   PAST MEDICAL HISTORY: Past Medical History:  Diagnosis Date  . Breast cancer ([place]) 1995 and 2007  . Cardiomyopathy secondary to chemotherapy [facility])    has improved   . Hyperlipidemia   . Hypertension    under control, has been on med. x 3 yrs.  . Melanoma in situ of back ([place]) 09/2011   left  . Personal history of chemotherapy   . Personal history of radiation therapy   . Skin cancer 2013   Melanoma    PAST SURGICAL HISTORY: Past Surgical History:  Procedure Laterality Date  . BREAST LUMPECTOMY  12/21/2005   right  . MASTECTOMY  1995   with breast reconstruction - left    FAMILY HISTORY Family History  Problem Relation Age of Onset  . Lung cancer Father   . Cancer Father        lung  . Autism Son   . Breast cancer Daughter    the patient's father died from complications of lung cancer at the age of 60. He was a heavy smoker. The patient's mother died at the age of 83. Tanya Mitchell had no siblings. She underwent genetic testing for breast and ovarian cancer panel April of 2014. There were no demonstrable mutations in the BRCA or the other genes in the panel   GYNECOLOGIC HISTORY:  Menarche age 41, first live birth age 2. She is GX P2. She entered menopause at age 89, when she received her chemotherapy. She did not use hormone replacement. She did use birth control remotely, for approximately 14 years, with no complications.   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  They recently me moved to a new home in the [facility] area.   Ragna's son Azucena Mitchell is handicapped, and works as an Training and development officer. [name] is Brewing technologist limited. Makayela has 3 grandchildren and [place] 2. Honor grew up in an Database administrator denomination   ADVANCED DIRECTIVES: In place   HEALTH MAINTENANCE:  Social History   Tobacco Use  . Smoking status: Former Smoker    Quit date: 02/14/1964    Years since quitting: 55.8  . Smokeless tobacco: Never Used  Vaping Use  . Vaping Use: Never used  Substance Use Topics  . Alcohol use: Yes    Comment: daily glass of wine  . Drug use: No     Colonoscopy: 2011  PAP: November 2014  Bone density: January 2013, osteopenia  Lipid panel:   August 2013/Dr. Barnes   No Known Allergies  Current Outpatient Medications  Medication Sig Dispense Refill  . aspirin EC 81 MG tablet Take 1 tablet (81 mg total) by mouth daily.    [name] atorvastatin (LIPITOR) 80 MG tablet TAKE 1 TABLET BY MOUTH DAILY 90 tablet 2  . carvedilol (COREG) 25 MG tablet TAKE ONE TABLET BY MOUTH TWO TIMES  DAILY. GENERIC EQUIVALENT FOR COREG 180 tablet 3  . cholecalciferol (VITAMIN D) 1000 UNITS tablet Take 2,000 Units by mouth daily.     . hydrochlorothiazide (HYDRODIURIL) 25 MG tablet TAKE 1 TABLET BY MOUTH DAILY 90 tablet 3  . metFORMIN (GLUCOPHAGE) 500 MG tablet 250 mg twice a day.    . ramipril (ALTACE) 10 MG capsule TAKE 1 CAPSULE BY MOUTH DAILY 90 capsule 2   No current facility-administered medications for this visit.    OBJECTIVE:   There were no vitals filed for this visit.   There is no height or weight on file to calculate BMI.    ECOG FS:1 - Symptomatic but completely ambulatory There were no vitals filed for this visit.  Telemedicine visit 11/20/2019     LAB RESULTS:   Lab Results  Component Value Date   WBC 34.2 (H) 10/31/2019   NEUTROABS 23.6 (H) 10/31/2019   HGB 11.8 (L) 10/31/2019   HCT 34.4 (L) 10/31/2019   MCV 88.7 10/31/2019   PLT 335 10/31/2019       Chemistry      Component Value Date/Time   NA 142 11/28/2018 1603   NA 139 11/30/2015 1225   K 5.2 11/28/2018 1603   K 4.6 11/30/2015 1225   CL 102 11/28/2018 1603   CL 104 01/18/2012 0948   CO2 26 11/28/2018 1603   CO2 27 11/30/2015 1225   BUN 14 11/28/2018 1603   BUN 13.1 11/30/2015 1225   CREATININE 0.63 11/28/2018 1603   CREATININE 0.7 11/30/2015 1225      Component Value Date/Time   CALCIUM 10.1 11/28/2018 1603   CALCIUM 10.0 11/30/2015 1225   ALKPHOS 83 11/30/2015 1225   AST 25 11/30/2015 1225   ALT 27 11/30/2015 1225   BILITOT 0.60 11/30/2015 1225      STUDIES: No results found.   ASSESSMENT: 74 y.o. BRCA negative [place] woman   (1) status post left mastectomy with TRAM reconstruction June of 1995 for a T1c N1, stage IIA invasive ductal carcinoma, grade 2, estrogen receptor 97% positive, progesterone receptor 45% positive, treated adjuvantly according to CALGB 9394 with 3 cycles of doxorubicin followed by 3 cycles of cyclophosphamide, then tamoxifen for 5 years, off therapy completed November of 2000  (2) status post right lumpectomy October 2007 for high-grade ductal carcinoma in situ, with negative margins estrogen and progesterone receptor negative, followed by adjuvant radiation therapy   (3) malignant melanoma, T2a N1a = stage IIIA, as follows:  (a) status post shave biopsy from the right upper back 09/15/2011 for a superficial spreading melanoma, Breslow depth 1.45 mm, Clark's level IV, with focal regression but no ulceration.  (b) status post wide excision and sentinel lymph node sampling 10/10/2011 with residual malignant melanoma in situ, but negative margins; the single sentinel lymph node was involved by melanoma with the largest subcapsular deposit measuring 0.22 mm, no evidence of capsular involvement or extracapsular extension  (c) completion nodal dissection was discussed, but not performed in part because the patient had already had a complete left  axillary lymph node dissection at the time of her 1995 left mastectomy (10 lymph nodes were removed at that time)  (d) adjuvant interferon was discussed with the patient when she visited [facility] in September 2013, but given that it side effects and marginal benefits the patient declined  (e) BRAF testing has not been done on the original tumor; this was also discussed with the patient at the time of her [facility] visit, as it might  possibly lead to participation in a research protocol; the patient decided not to pursue that option  (4) PET scan 11/19/18/2015 shows nonspecific uptake only at T6.   (a) MRI of the thoracic spine November 2015 showed no evidence of metastatic disease  (b) repeat PET scan 12/02/2014 shows no residual or recurrent hypermetabolic tumor.  (5) thrombocytosis first noted October 2017, leukocytosis first noted November 2019  (a) bone marrow biopsy 04/03/2018 showed a hypercellular bone marrow with granulocytic and megakaryocytic proliferation, some of the megakaryocytes being small and/or hypolobulated.  There was no increase in blasts, no significant increase in reticulin fibers  (b) cytogenetics from bone marrow biopsy 04/03/2018 showed 46,XX[20].nuc ish (ABL1, BCR)x2  (c) molecular studies 12/18/2017 showed no mutations in JAK2 exons 12 and 14 (including V 617);  MPL exon 10 or CALR exon 9  (d) BCR/ABL 1 drawn 12/18/2017 showed 94% normal nuclei (6% with single fusion)  (e) repeat BCR/ABL 1 on 04/30/2018 showed 100% normal nuclei  (f) repeat BCR/ABL 1 on 02/27/2019: Translocation not detected  (g) bone marrow biopsy 10/31/2019 shows again a hypercellular marrow with myeloid hyperplasia but no increase in blasts (1%).  There were a few atypical megakaryocytes.  Cytogenetics found no metaphase cells available for analysis.  NGS panel is pending   PLAN: Norleen is now just over 27 years from her left mastectomy and 14 years from her right lumpectomy.  There is no evidence of disease  recurrence.  She continues to have an elevated white cell counts and a hypercellular marrow but there is no increase in blasts, no significant dysplasia other than for a few atypical megakaryocytes and cytogenetics has not been informative.  An NGS panel is pending.  We discussed these results.  At this point all we are going to do is continue follow-up.  She is going to see me again in 6 months with labs a few days before that visit.  She does know to call for any problems that may develop before then   Chauncey Cruel, MD   11/20/2019 3:16 PM  Oncology and Hematology [facility] [place], [place] [address] Tel. [phone]  Joylene Igo [phone]   I, Wilburn Mylar, am acting as scribe for Dr. Virgie Dad. Gael Delude.  I, Lurline Del MD, have reviewed the above documentation for accuracy and completeness, and I agree with the above.   *Total Encounter Time as defined by the Centers for Medicare and Medicaid Services includes, in addition to the face-to-face time of a patient visit (documented in the note above) non-face-to-face time: obtaining and reviewing outside history, ordering and reviewing medications, tests or procedures, care coordination (communications with other health care professionals or caregivers) and documentation in the medical record.

## 2019-11-20 ENCOUNTER — Telehealth (HOSPITAL_BASED_OUTPATIENT_CLINIC_OR_DEPARTMENT_OTHER): Payer: Medicare Other | Admitting: Oncology

## 2019-11-20 DIAGNOSIS — C4359 Malignant melanoma of other part of trunk: Secondary | ICD-10-CM | POA: Diagnosis not present

## 2019-11-25 ENCOUNTER — Encounter: Payer: Self-pay | Admitting: Oncology

## 2019-11-25 ENCOUNTER — Encounter (HOSPITAL_COMMUNITY): Payer: Self-pay | Admitting: Oncology

## 2019-12-02 LAB — SURGICAL PATHOLOGY

## 2019-12-09 ENCOUNTER — Telehealth: Payer: Self-pay | Admitting: *Deleted

## 2019-12-09 NOTE — Telephone Encounter (Signed)
S/w pt to move pt's appt same day and time to [facility].  Pt is agreeable.

## 2019-12-31 DIAGNOSIS — Z853 Personal history of malignant neoplasm of breast: Secondary | ICD-10-CM | POA: Diagnosis not present

## 2019-12-31 DIAGNOSIS — E7800 Pure hypercholesterolemia, unspecified: Secondary | ICD-10-CM | POA: Diagnosis not present

## 2019-12-31 DIAGNOSIS — E119 Type 2 diabetes mellitus without complications: Secondary | ICD-10-CM | POA: Diagnosis not present

## 2019-12-31 DIAGNOSIS — I10 Essential (primary) hypertension: Secondary | ICD-10-CM | POA: Diagnosis not present

## 2020-01-06 ENCOUNTER — Other Ambulatory Visit: Payer: Self-pay | Admitting: Oncology

## 2020-01-12 ENCOUNTER — Encounter: Payer: Self-pay | Admitting: Oncology

## 2020-01-12 ENCOUNTER — Other Ambulatory Visit: Payer: Self-pay | Admitting: Oncology

## 2020-01-13 ENCOUNTER — Telehealth: Payer: Self-pay | Admitting: Oncology

## 2020-01-13 NOTE — Telephone Encounter (Signed)
Scheduled appt per 11/29 sch msg - mailed reminder letter with appt date and time  ° °

## 2020-01-14 NOTE — Progress Notes (Deleted)
CARDIOLOGY OFFICE NOTE  Date:  01/14/2020    Tanya Mitchell Date of Birth: [birth year] Medical Record #[id number]  PCP:  Leighton Ruff, MD  Cardiologist:  Berton Bon chief complaint on file.   History of Present Illness: Tanya Mitchell is a 74 y.o. female who presents today for a follow up visit. She is a former patient of Dr. Claris Gladden as well as Dr. Susa Simmonds. Primarily follows with me.    She has had a prior cardiomyopathy secondary to chemotherapy for breast cancer in the remote past. Her EF recovered.  Other issues include melanoma - now seeing Dr. Jana Hakim and Dr. Harriet Masson at [facility] for her melanoma. Labs are checked by PCP. She has HLD and DM as well.    I have followed her over the past several years - she has done well. Has had some issues with her blood counts - unknown as to why despite extensive work up. Echo was updated last year - EF has dropped some - we tried adding Aldactone but potassium was to high prior to starting - we have thus increased her Coreg. She has been busy with a new house build. Last seen in June - she was doing well - finally got moved into her house. White count remains up for unclear reasons.     Comes in today. Here with   Past Medical History:  Diagnosis Date  . Breast cancer ([place]) 1995 and 2007  . Cardiomyopathy secondary to chemotherapy [facility])    has improved   . Hyperlipidemia   . Hypertension    under control, has been on med. x 3 yrs.  . Melanoma in situ of back ([place]) 09/2011   left  . Personal history of chemotherapy   . Personal history of radiation therapy   . Skin cancer 2013   Melanoma    Past Surgical History:  Procedure Laterality Date  . BREAST LUMPECTOMY  12/21/2005   right  . MASTECTOMY  1995   with breast reconstruction - left     Medications: No outpatient medications have been marked as taking for the 01/28/20 encounter (Appointment) with Burtis Junes, NP.     Allergies: No Known  Allergies  Social History: The patient  reports that she quit smoking about 55 years ago. She has never used smokeless tobacco. She reports current alcohol use. She reports that she does not use drugs.   Family History: The patient's ***family history includes Autism in her son; Breast cancer in her daughter; Cancer in her father; Lung cancer in her father.   Review of Systems: Please see the history of present illness.   All other systems are reviewed and negative.   Physical Exam: VS:  There were no vitals taken for this visit. [name]  BMI There is no height or weight on file to calculate BMI.  Wt Readings from Last 3 Encounters:  10/15/19 123 lb 1.6 oz (55.8 kg)  08/11/19 125 lb 6.4 oz (56.9 kg)  02/27/19 128 lb 14.4 oz (58.5 kg)    General: Pleasant. Well developed, well nourished and in no acute distress.   HEENT: Normal.  Neck: Supple, no JVD, carotid bruits, or masses noted.  Cardiac: ***Regular rate and rhythm. No murmurs, rubs, or gallops. No edema.  Respiratory:  Lungs are clear to auscultation bilaterally with normal work of breathing.  GI: Soft and nontender.  MS: No deformity or atrophy. Gait and ROM intact.  Skin: Warm and dry. Color  is normal.  Neuro:  Strength and sensation are intact and no gross focal deficits noted.  Psych: Alert, appropriate and with normal affect.   LABORATORY DATA:  EKG:  EKG {ACTION; IS/IS ZOX:[id number]} ordered today.  Personally reviewed by me. This demonstrates ***.  Lab Results  Component Value Date   WBC 34.2 (H) 10/31/2019   HGB 11.8 (L) 10/31/2019   HCT 34.4 (L) 10/31/2019   PLT 335 10/31/2019   GLUCOSE 126 (H) 11/28/2018   ALT 27 11/30/2015   AST 25 11/30/2015   NA 142 11/28/2018   K 5.2 11/28/2018   CL 102 11/28/2018   CREATININE 0.63 11/28/2018   BUN 14 11/28/2018   CO2 26 11/28/2018     BNP (last 3 results) No results for input(s): BNP in the last 8760 hours.  ProBNP (last 3 results) No results for input(s): PROBNP  in the last 8760 hours.   Other Studies Reviewed Today:   ECHO IMPRESSIONS 10/2018    1. Left ventricular ejection fraction, by visual estimation, is 45 to 50%. The left ventricle has mildly decreased function. Normal left ventricular size. There is no left ventricular hypertrophy.  2. Elevated left ventricular end-diastolic pressure.  3. Left ventricular diastolic Doppler parameters are consistent with impaired relaxation pattern of LV diastolic filling.  4. Global longitudinal strain abnormal (-11.5%). Very slightly worse than 10/2017.  5. Global right ventricle has normal systolic function.The right ventricular size is normal. No increase in right ventricular wall thickness.  6. Left atrial size was moderately dilated.  7. Right atrial size was normal.  8. The mitral valve is normal in structure. Trace mitral valve regurgitation. No evidence of mitral stenosis.  9. The tricuspid valve is normal in structure. Tricuspid valve regurgitation is mild. 10. The aortic valve is normal in structure. Aortic valve regurgitation was not visualized by color flow Doppler. Structurally normal aortic valve, with no evidence of sclerosis or stenosis. 11. The pulmonic valve was normal in structure. Pulmonic valve regurgitation is trivial by color flow Doppler. 12. Mildly elevated pulmonary artery systolic pressure. 13. The inferior vena cava is normal in size with greater than 50% respiratory variability, suggesting right atrial pressure of 3 mmHg.   Echo Study Conclusions 10/2017   - Left ventricle: The cavity size was normal. Wall thickness was   normal. Systolic function was normal. The estimated ejection   fraction was in the range of 50% to 55%. Diffusely abnormal GLS   at -13%. Wall motion was normal; there were no regional wall   motion abnormalities. Doppler parameters are consistent with   abnormal left ventricular relaxation (grade 1 diastolic   dysfunction). The E/e&' ratio is between 8-15,  suggesting   indeterminate LV filling pressure. - Mitral valve: Mildly thickened leaflets . There was trivial   regurgitation. - Left atrium: The atrium was normal in size. - Inferior vena cava: The vessel was normal in size. The   respirophasic diameter changes were in the normal range (>= 50%),   consistent with normal central venous pressure.   Impressions:   - Low normal LVEF 50-55%, diffusely abnormal GLS at -13%, grade 1   DD, indeterminate LV filling pressure, normal LA size.     Notes recorded by Burtis Junes, NP on 11/05/2017 at 2:23 PM EDT  I reviewed this study with Dr. Meda Coffee last week - I have reported this to [facility] myself - we will plan to repeat this study in one year. Discussed the GLS - unknown if  this is a reflection from the past or not. Jaunita remains without symptoms.       Echo Study Conclusions from 2014  - Left ventricle: The cavity size was normal. Wall thickness   was normal. The estimated ejection fraction was 55%. - Mitral valve: Mild regurgitation. - Atrial septum: No defect or patent foramen ovale was   identified.   Assessment/Plan:   1. History of NICM - she has had slight drop in her EF - last echo noted - she has also had abnormal GLS score - I have talked with Dr. Meda Coffee about this in the past - unclear if this was a reflection of past LV dysfunction or what is to come. She is not able to have Aldactone due to hyperkalemia. She is on max dose of Coreg and ACE. Could consider Entresto but she is currently NYHA I - will need to follow.   2. HLD - on statin - she is going to try and get her labs thru her PCP.   3. HTN - BP is fine here today - no changes made - would stay on Coreg and Altace.    4. Melanoma/breast cancer - follows with Oncology - sees them next month.    5. Elevated WBC and platelet count - unknown etiology despite extensive work up.    6. History of PVCs - resolved.   Current medicines are reviewed with the patient today.   The patient does not have concerns regarding medicines other than what has been noted above.  The following changes have been made:  See above.  Labs/ tests ordered today include:   No orders of the defined types were placed in this encounter.    Disposition:   FU with *** in {gen number 4-23:536144} {Days to years:10300}.   Patient is agreeable to this plan and will call if any problems develop in the interim.   SignedTruitt Merle, NP  01/14/2020 11:19 AM  [place] [facility] [address] [place], [place]  [address] Phone: [phone] Fax: [phone]

## 2020-01-27 NOTE — Progress Notes (Signed)
CARDIOLOGY OFFICE NOTE  Date:  01/28/2020    Tanya Mitchell Date of Birth: 02-01-1946 Medical Record #[id number]  PCP:  Tanya Ruff, MD  Cardiologist:  Tanya Mitchell     Chief Complaint  Patient presents with  . Follow-up    History of Present Illness: Tanya Mitchell is a 74 y.o. female who presents today for a follow up visit. She is a former patient of Dr. Claris Mitchell as well as Dr. Susa Mitchell. Primarily follows with me.    She has had a prior cardiomyopathy secondary to chemotherapy for breast cancer in the remote past. Her EF initially recovered.  Other issues include melanoma - seeing Dr. Jana Mitchell and Dr. Harriet Mitchell at [facility] for her melanoma. Other issues include HLD and DM as well. Chronically abnormal EKG.    I have followed her over the past several years - she has done well. Has had some issues with her blood counts - unknown as to why despite extensive work up. Echo was updated last year - EF had dropped some - we tried adding Aldactone but potassium was to high prior to starting - we have thus increased her Coreg. She had been busy with a new house build. Last seen in June - had gotten to move into her new house - was doing well. Still with some elevation in her WBC.     Comes in today. Here alone. She is doing well. No chest pain. Not short of breath. Not dizzy or lightheaded. Feels good on her medicines. Has had bone marrow biopsy - there is some issue that is going on - but seems slow growing.   Past Medical History:  Diagnosis Date  . Breast cancer ([place]) 1995 and 2007  . Cardiomyopathy secondary to chemotherapy [facility])    has improved   . Hyperlipidemia   . Hypertension    under control, has been on med. x 3 yrs.  . Melanoma in situ of back ([place]) 09/2011   left  . Personal history of chemotherapy   . Personal history of radiation therapy   . Skin cancer 2013   Melanoma    Past Surgical History:  Procedure Laterality Date  . BREAST LUMPECTOMY   12/21/2005   right  . MASTECTOMY  1995   with breast reconstruction - left     Medications: Current Meds  Medication Sig  . aspirin EC 81 MG tablet Take 1 tablet (81 mg total) by mouth daily.  [name] atorvastatin (LIPITOR) 80 MG tablet TAKE 1 TABLET BY MOUTH DAILY  . carvedilol (COREG) 25 MG tablet TAKE ONE TABLET BY MOUTH TWO TIMES DAILY. GENERIC EQUIVALENT FOR COREG  . cholecalciferol (VITAMIN D) 1000 UNITS tablet Take 2,000 Units by mouth daily.   . hydrochlorothiazide (HYDRODIURIL) 25 MG tablet TAKE 1 TABLET BY MOUTH DAILY  . metFORMIN (GLUCOPHAGE) 500 MG tablet 250 mg twice a day.  . ramipril (ALTACE) 10 MG capsule TAKE 1 CAPSULE BY MOUTH DAILY     Allergies: No Known Allergies  Social History: The patient  reports that she quit smoking about 55 years ago. She has never used smokeless tobacco. She reports current alcohol use. She reports that she does not use drugs.   Family History: The patient's family history includes Autism in her son; Breast cancer in her daughter; Cancer in her father; Lung cancer in her father.   Review of Systems: Please see the history of present illness.   All other systems are reviewed and negative.  Physical Exam: VS:  BP 120/76   Pulse 71   Ht '5\' 2"'  (1.575 m)   Wt 123 lb (55.8 kg)   SpO2 98%   BMI 22.50 kg/m  .  BMI Body mass index is 22.5 kg/m.  Wt Readings from Last 3 Encounters:  01/28/20 123 lb (55.8 kg)  10/15/19 123 lb 1.6 oz (55.8 kg)  08/11/19 125 lb 6.4 oz (56.9 kg)    General: Pleasant. Alert and in no acute distress.   Cardiac: Regular rate and rhythm. No murmurs, rubs, or gallops. No edema.  Respiratory:  Lungs are clear to auscultation bilaterally with normal work of breathing.  GI: Soft and nontender.  MS: No deformity or atrophy. Gait and ROM intact.  Skin: Warm and dry. Color is normal.  Neuro:  Strength and sensation are intact and no gross focal deficits noted.  Psych: Alert, appropriate and with normal  affect.   LABORATORY DATA:  EKG:  EKG is ordered today.  Personally reviewed by me. This demonstrates sinus rhythm with 1st degree AV block - RSR' and septal Q's - unchanged.  Lab Results  Component Value Date   WBC 34.2 (H) 10/31/2019   HGB 11.8 (L) 10/31/2019   HCT 34.4 (L) 10/31/2019   PLT 335 10/31/2019   GLUCOSE 126 (H) 11/28/2018   ALT 27 11/30/2015   AST 25 11/30/2015   NA 142 11/28/2018   K 5.2 11/28/2018   CL 102 11/28/2018   CREATININE 0.63 11/28/2018   BUN 14 11/28/2018   CO2 26 11/28/2018       BNP (last 3 results) No results for input(s): BNP in the last 8760 hours.  ProBNP (last 3 results) No results for input(s): PROBNP in the last 8760 hours.   Other Studies Reviewed Today:  ECHO IMPRESSIONS 10/2018    1. Left ventricular ejection fraction, by visual estimation, is 45 to 50%. The left ventricle has mildly decreased function. Normal left ventricular size. There is no left ventricular hypertrophy.  2. Elevated left ventricular end-diastolic pressure.  3. Left ventricular diastolic Doppler parameters are consistent with impaired relaxation pattern of LV diastolic filling.  4. Global longitudinal strain abnormal (-11.5%). Very slightly worse than 10/2017.  5. Global right ventricle has normal systolic function.The right ventricular size is normal. No increase in right ventricular wall thickness.  6. Left atrial size was moderately dilated.  7. Right atrial size was normal.  8. The mitral valve is normal in structure. Trace mitral valve regurgitation. No evidence of mitral stenosis.  9. The tricuspid valve is normal in structure. Tricuspid valve regurgitation is mild. 10. The aortic valve is normal in structure. Aortic valve regurgitation was not visualized by color flow Doppler. Structurally normal aortic valve, with no evidence of sclerosis or stenosis. 11. The pulmonic valve was normal in structure. Pulmonic valve regurgitation is trivial by color flow  Doppler. 12. Mildly elevated pulmonary artery systolic pressure. 13. The inferior vena cava is normal in size with greater than 50% respiratory variability, suggesting right atrial pressure of 3 mmHg.   Echo Study Conclusions 10/2017   - Left ventricle: The cavity size was normal. Wall thickness was   normal. Systolic function was normal. The estimated ejection   fraction was in the range of 50% to 55%. Diffusely abnormal GLS   at -13%. Wall motion was normal; there were no regional wall   motion abnormalities. Doppler parameters are consistent with   abnormal left ventricular relaxation (grade 1 diastolic   dysfunction). The  E/e&' ratio is between 8-15, suggesting   indeterminate LV filling pressure. - Mitral valve: Mildly thickened leaflets . There was trivial   regurgitation. - Left atrium: The atrium was normal in size. - Inferior vena cava: The vessel was normal in size. The   respirophasic diameter changes were in the normal range (>= 50%),   consistent with normal central venous pressure.   Impressions:   - Low normal LVEF 50-55%, diffusely abnormal GLS at -13%, grade 1   DD, indeterminate LV filling pressure, normal LA size.    Notes recorded by Burtis Junes, NP on 11/05/2017 at 2:23 PM EDT  I reviewed this study with Dr. Meda Coffee last week - I have reported this to [facility] myself - we will plan to repeat this study in one year. Discussed the GLS - unknown if this is a reflection from the past or not. Eddis remains without symptoms.      Echo Study Conclusions from 2014  - Left ventricle: The cavity size was normal. Wall thickness   was normal. The estimated ejection fraction was 55%. - Mitral valve: Mild regurgitation. - Atrial septum: No defect or patent foramen ovale was   identified.    Assessment/Plan:  1. NICM - has had slight drop back in her EF - she has also had abnormal GLS score - I have talked with Dr. Meda Coffee about this in the pat - unclear if this was a  reflection of past LV dysfunction or what is to come. No Aldactone due to hyperkalemia. On maximum Coreg. NYHA I.   2. HLD - on statin - labs today  3. HTN - BP is great - no changes mdae today - continue beta blocker and ACE.   4. Melanoma/breast cancer - follows with Oncology.   5. Elevated WBC/platelet count - getting repeat lab with CBC with diff for Dr. Jana Mitchell.   Current medicines are reviewed with the patient today.  The patient does not have concerns regarding medicines other than what has been noted above.  The following changes have been made:  See above.  Labs/ tests ordered today include:    Orders Placed This Encounter  Procedures  . Basic metabolic panel  . CBC with Differential/Platelet  . Hepatic function panel  . Lipid panel  . EKG 12-Lead     Disposition:   FU with Dr. Johney Frame in about 6 months. She is aware that I am leaving in February. Consider scheduling echo on return. No change with current regimen plan of care. She is felt to be doing well.   Patient is agreeable to this plan and will call if any problems develop in the interim.   SignedTruitt Merle, NP  01/28/2020 12:20 PM  [facility] [address] [place], [place]  [address] Phone: [phone] Fax: [phone]

## 2020-01-28 ENCOUNTER — Other Ambulatory Visit: Payer: Self-pay

## 2020-01-28 ENCOUNTER — Ambulatory Visit: Payer: Medicare Other | Admitting: Nurse Practitioner

## 2020-01-28 ENCOUNTER — Other Ambulatory Visit: Payer: Medicare Other | Admitting: *Deleted

## 2020-01-28 ENCOUNTER — Encounter: Payer: Self-pay | Admitting: Nurse Practitioner

## 2020-01-28 VITALS — BP 120/76 | HR 71 | Ht 62.0 in | Wt 123.0 lb

## 2020-01-28 DIAGNOSIS — I5022 Chronic systolic (congestive) heart failure: Secondary | ICD-10-CM

## 2020-01-28 DIAGNOSIS — E782 Mixed hyperlipidemia: Secondary | ICD-10-CM | POA: Diagnosis not present

## 2020-01-28 DIAGNOSIS — I10 Essential (primary) hypertension: Secondary | ICD-10-CM

## 2020-01-28 DIAGNOSIS — I428 Other cardiomyopathies: Secondary | ICD-10-CM

## 2020-01-28 NOTE — Patient Instructions (Addendum)
After Visit Summary:  We will be checking the following labs today - BMET, HPF, Lipids & CBC with differential   Medication Instructions:    Continue with your current medicines.    If you need a refill on your cardiac medications before your next appointment, please call your pharmacy.     Testing/Procedures To Be Arranged:  N/A  Follow-Up:   See Dr. Gwyndolyn Kaufman in 6 months as new patient    At [facility], you and your health needs are our priority.  As part of our continuing mission to provide you with exceptional heart care, we have created designated Provider Care Teams.  These Care Teams include your primary Cardiologist (physician) and Advanced Practice Providers (APPs -  Physician Assistants and Nurse Practitioners) who all work together to provide you with the care you need, when you need it.  Special Instructions:  . Stay safe, wash your hands for at least 20 seconds and wear a mask when needed.  . It was good to talk with you today.    Call the [place] office at [phone] if you have any questions, problems or concerns.

## 2020-01-29 ENCOUNTER — Telehealth: Payer: Self-pay

## 2020-01-29 ENCOUNTER — Other Ambulatory Visit: Payer: Self-pay | Admitting: Oncology

## 2020-01-29 DIAGNOSIS — I5022 Chronic systolic (congestive) heart failure: Secondary | ICD-10-CM

## 2020-01-29 DIAGNOSIS — C4359 Malignant melanoma of other part of trunk: Secondary | ICD-10-CM

## 2020-01-29 DIAGNOSIS — D75839 Thrombocytosis, unspecified: Secondary | ICD-10-CM

## 2020-01-29 DIAGNOSIS — D72829 Elevated white blood cell count, unspecified: Secondary | ICD-10-CM

## 2020-01-29 LAB — BASIC METABOLIC PANEL
BUN/Creatinine Ratio: 19 (ref 12–28)
BUN: 11 mg/dL (ref 8–27)
CO2: 27 mmol/L (ref 20–29)
Calcium: 9.6 mg/dL (ref 8.7–10.3)
Chloride: 95 mmol/L — ABNORMAL LOW (ref 96–106)
Creatinine, Ser: 0.58 mg/dL (ref 0.57–1.00)
GFR calc Af Amer: 104 mL/min/{1.73_m2} (ref >59)
GFR calc non Af Amer: 90 mL/min/{1.73_m2} (ref >59)
Glucose: 136 mg/dL — ABNORMAL HIGH (ref 65–99)
Potassium: 4.7 mmol/L (ref 3.5–5.2)
Sodium: 136 mmol/L (ref 134–144)

## 2020-01-29 LAB — CBC WITH DIFFERENTIAL/PLATELET

## 2020-01-29 LAB — LIPID PANEL
Chol/HDL Ratio: 4.3 ratio (ref 0.0–4.4)
Cholesterol, Total: 126 mg/dL (ref 100–199)
HDL: 29 mg/dL — ABNORMAL LOW (ref >39)
LDL Chol Calc (NIH): 65 mg/dL (ref 0–99)
Triglycerides: 190 mg/dL — ABNORMAL HIGH (ref 0–149)
VLDL Cholesterol Cal: 32 mg/dL (ref 5–40)

## 2020-01-29 LAB — HEPATIC FUNCTION PANEL
ALT: 23 IU/L (ref 0–32)
AST: 26 IU/L (ref 0–40)
Albumin: 4.2 g/dL (ref 3.7–4.7)
Alkaline Phosphatase: 57 IU/L (ref 44–121)
Bilirubin Total: 0.4 mg/dL (ref 0.0–1.2)
Bilirubin, Direct: 0.12 mg/dL (ref 0.00–0.40)
Total Protein: 6.5 g/dL (ref 6.0–8.5)

## 2020-01-29 NOTE — Telephone Encounter (Addendum)
Left message for the pt to call back.. see CBC lab result from 01/28/20  pt had a clot in her specimen so needs to be redrawn she can have at [place] and Katrina says she will have the CBC ran STAT.   CBC ordered.   LABS BEING DONE FOR ONCOLOGY.   TRIAGE TO CONTINUE TO TRY TO REACH THE PT.. I SPOKE WITH MARSHA

## 2020-01-29 NOTE — Progress Notes (Signed)
[place] lab work yesterday clotted.  She needs to have it redrawn anyway.  She is going to come 02/02/2020 to get a CBC and differential here and I will review the blood film and then gave her the molecular studies that we obtained the most recently.

## 2020-01-29 NOTE — Telephone Encounter (Signed)
-----   Message from Burtis Junes, NP sent at 01/29/2020 11:55 AM EST ----- Ok to report. Labs are stable - looks like there was a problem with resulting the CBC - can we check on this - this was being done for Oncology.  ? If needs to be redrawn - which I am happy to do (CBC with diff) or defer back to Dr. Jana Hakim.

## 2020-01-30 NOTE — Telephone Encounter (Signed)
Spoke with the pt and she reports that Dr. Jana Hakim will be redrawing the CBC on Monday 02/02/20. She was appreciative for the follow through from our office and will call back if she has any further questions or problems

## 2020-01-30 NOTE — Addendum Note (Signed)
Addended by: Stephani Police on: 01/30/2020 11:05 AM   Modules accepted: Orders

## 2020-02-02 ENCOUNTER — Other Ambulatory Visit: Payer: Self-pay | Admitting: Oncology

## 2020-02-02 ENCOUNTER — Inpatient Hospital Stay: Payer: Medicare Other | Attending: Oncology

## 2020-02-02 ENCOUNTER — Other Ambulatory Visit: Payer: Self-pay

## 2020-02-02 ENCOUNTER — Inpatient Hospital Stay (HOSPITAL_BASED_OUTPATIENT_CLINIC_OR_DEPARTMENT_OTHER): Payer: Medicare Other | Admitting: Oncology

## 2020-02-02 VITALS — BP 150/79 | HR 80 | Temp 97.0°F | Resp 18 | Wt 122.3 lb

## 2020-02-02 DIAGNOSIS — D75839 Thrombocytosis, unspecified: Secondary | ICD-10-CM

## 2020-02-02 DIAGNOSIS — Z9221 Personal history of antineoplastic chemotherapy: Secondary | ICD-10-CM | POA: Insufficient documentation

## 2020-02-02 DIAGNOSIS — D72829 Elevated white blood cell count, unspecified: Secondary | ICD-10-CM | POA: Insufficient documentation

## 2020-02-02 DIAGNOSIS — Z853 Personal history of malignant neoplasm of breast: Secondary | ICD-10-CM | POA: Insufficient documentation

## 2020-02-02 DIAGNOSIS — D649 Anemia, unspecified: Secondary | ICD-10-CM | POA: Diagnosis not present

## 2020-02-02 DIAGNOSIS — D72820 Lymphocytosis (symptomatic): Secondary | ICD-10-CM

## 2020-02-02 DIAGNOSIS — Z85820 Personal history of malignant melanoma of skin: Secondary | ICD-10-CM | POA: Diagnosis not present

## 2020-02-02 DIAGNOSIS — Z9012 Acquired absence of left breast and nipple: Secondary | ICD-10-CM | POA: Insufficient documentation

## 2020-02-02 DIAGNOSIS — Z923 Personal history of irradiation: Secondary | ICD-10-CM | POA: Insufficient documentation

## 2020-02-02 DIAGNOSIS — C4359 Malignant melanoma of other part of trunk: Secondary | ICD-10-CM

## 2020-02-02 DIAGNOSIS — Z87891 Personal history of nicotine dependence: Secondary | ICD-10-CM | POA: Insufficient documentation

## 2020-02-02 DIAGNOSIS — D72828 Other elevated white blood cell count: Secondary | ICD-10-CM

## 2020-02-02 LAB — CBC WITH DIFFERENTIAL/PLATELET
Abs Immature Granulocytes: 8 10*3/uL — ABNORMAL HIGH (ref 0.00–0.07)
Band Neutrophils: 15 %
Basophils Absolute: 0 10*3/uL (ref 0.0–0.1)
Basophils Relative: 0 %
Blasts: 2 %
Eosinophils Absolute: 0.5 10*3/uL (ref 0.0–0.5)
Eosinophils Relative: 1 %
HCT: 31.8 % — ABNORMAL LOW (ref 36.0–46.0)
Hemoglobin: 10.9 g/dL — ABNORMAL LOW (ref 12.0–15.0)
Lymphocytes Relative: 11 %
Lymphs Abs: 5.5 10*3/uL — ABNORMAL HIGH (ref 0.7–4.0)
MCH: 31.9 pg (ref 26.0–34.0)
MCHC: 34.3 g/dL (ref 30.0–36.0)
MCV: 93 fL (ref 80.0–100.0)
Metamyelocytes Relative: 10 %
Monocytes Absolute: 0 10*3/uL — ABNORMAL LOW (ref 0.1–1.0)
Monocytes Relative: 0 %
Myelocytes: 6 %
Neutro Abs: 34.8 10*3/uL — ABNORMAL HIGH (ref 1.7–7.7)
Neutrophils Relative %: 55 %
Platelets: 208 10*3/uL (ref 150–400)
RBC: 3.42 MIL/uL — ABNORMAL LOW (ref 3.87–5.11)
RDW: 17 % — ABNORMAL HIGH (ref 11.5–15.5)
WBC: 49.7 10*3/uL — ABNORMAL HIGH (ref 4.0–10.5)
nRBC: 0 % (ref 0.0–0.2)

## 2020-02-02 LAB — SAVE SMEAR(SSMR), FOR PROVIDER SLIDE REVIEW

## 2020-02-02 LAB — PATHOLOGIST SMEAR REVIEW

## 2020-02-02 NOTE — Progress Notes (Unsigned)
I asked Tanya Mitchell to drop in today after we checked a repeat CBC.  Tanya Mitchell platelet count is now normal, but Tanya Mitchell white cell count continues to rise and is currently over 40,000.  I do not see frank leukemia on the peripheral blood film review although there are some unusual cells and I have requested a pathology smear review.  Tanya Mitchell is completely asymptomatic, feels fine, has no rash, no fever, no bleeding, no pain, no change in weight or functional status.  She feels completely normal.  I reviewed the neotype analysis obtained from Tanya Mitchell prior bone marrow here.  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  This of course raises concern regarding a developing myeloid neoplasm  At this point we could do a bone marrow biopsy here but I think the better plan is for Tanya Mitchell to see the hematologist at [facility] Dr. Joan Mayans, who can do a review of Tanya Mitchell blood film there and decide whether to proceed to bone marrow biopsy and which particular studies to obtain.  I have called [facility] and set up the patient there for an appointment 02/16/2019.  I will do a virtual visit with Tanya Mitchell the second week in January for follow-up.

## 2020-02-02 NOTE — Progress Notes (Signed)
ID: Tanya Mitchell OB: [birth year]  MR#: [id number]  CSN#:[id number]  Patient Care Team: Leighton Ruff, MD as PCP - General (Family Medicine) Burtis Junes, NP as Nurse Practitioner (Nurse Practitioner) Stella Encarnacion, Virgie Dad, MD as Consulting Physician (Oncology) Bobbye Charleston, MD as Consulting Physician (Obstetrics and Gynecology) Janyth Contes, MD as Referring Physician (Hematology and Oncology) OTHER MD:  CHIEF COMPLAINTS:   1)  Hx Left Breast Cancer 1995 (s/p left mastectomy)       2) Hx Right Breast Cancer 2007                   3)  Hx Melanoma, Stage IIIA       4) leukocytosis/thrombocytosis      CURRENT TREATMENT: observation   INTERVAL HISTORY: Tanya Mitchell returns today for follow-up of her leukocytosis.  She also has a remote history of breast cancer  Since her last visit, she underwent right screening mammography with tomography at [facility] on 10/13/2019 showing: breast density category B; no evidence of malignancy in either breast.   REVIEW OF SYSTEMS: Tanya Mitchell is entirely asymptomatic.  She is planning visits with family over the holidays.  Specifically there has been no bleeding no rash no fever no pain no weight loss and no unusual or unexplained fatigue.  HISTORY OF PRESENT ILLNESS: From the earlier summary:  Tanya Mitchell's history of breast cancer dates back to June of 1995, when she had left modified radical mastectomy under [facility] for a pT1c pN1, stage IIA invasive ductal carcinoma, grade 2, estrogen receptor 97% positive, progesterone receptor 45% positive, treated according to CALGB 9394, sequential arm (doxorubicin x3, fourth dose apparently omitted, followed by high-dose cyclophosphamide x3), followed by tamoxifen for 5 years. There has been no evidence of disease recurrence.  Further, in October of 2007 she underwent right lumpectomy for a ductal carcinoma in situ, high-grade, estrogen and progesterone receptor negative, followed by adjuvant  radiation.  In July of 2013 the patient was noted to have an irregular mole in her left upper back. It is not clear to me whether this might be related to her prior left breast irradiation. Shave biopsy of this area 09/15/2011 by Dr. Derrel Nip [phone]) showed a superficial spreading malignant melanoma, with Breslow depth 1.45 mm, Clark's level IV. There was brisk host response and 8 mitoses per high power field were noted. There was focal regression but no definitive ulceration. There was no satellitosis. There was no vascular and urgent. Margins were positive, but cleared by wide excision under Dr Stark Klein 10/10/2011. There was some residual malignant melanoma in situ but margins to 2 cm were obtained, with advancement flap closure of the skin defect. In addition left axillary sentinel lymph node mapping was performed showing metastatic melanoma in the single lymph node removed.  Tanya Mitchell sought a second opinion at [facility] under Dr Joaquim Lai Collichio. There was a full discussion regarding completion nodal resection, use of adjuvant interferon, and BRAF testing with a view to participation in a research protocol then available. The patient considered all these options and after much discussion the decision was made not to pursue full nodal dissection, partly because the area in question had a ready undergone surgery and radiation. The patient opted against interferon adjuvant therapy because of its very marginal benefit and significant side effects. She declined consideration of a research study and BRAF testing was not performed.  Her subsequent history is as detailed below.   PAST MEDICAL HISTORY: Past Medical History:  Diagnosis Date  .  Breast cancer ([place]) 1995 and 2007  . Cardiomyopathy secondary to chemotherapy [facility])    has improved   . Hyperlipidemia   . Hypertension    under control, has been on med. x 3 yrs.  . Melanoma in situ of back ([place]) 09/2011   left  . Personal history of  chemotherapy   . Personal history of radiation therapy   . Skin cancer 2013   Melanoma    PAST SURGICAL HISTORY: Past Surgical History:  Procedure Laterality Date  . BREAST LUMPECTOMY  12/21/2005   right  . MASTECTOMY  1995   with breast reconstruction - left    FAMILY HISTORY Family History  Problem Relation Age of Onset  . Lung cancer Father   . Cancer Father        lung  . Autism Son   . Breast cancer Daughter    the patient's father died from complications of lung cancer at the age of 18. He was a heavy smoker. The patient's mother died at the age of 14. Tanya Mitchell had no siblings. She underwent genetic testing for breast and ovarian cancer panel April of 2014. There were no demonstrable mutations in the BRCA or the other genes in the panel   GYNECOLOGIC HISTORY:  Menarche age 74, first live birth age 28. She is GX P2. She entered menopause at age 74, when she received her chemotherapy. She did not use hormone replacement. She did use birth control remotely, for approximately 14 years, with no complications.   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  She also volunteered at the [employer]. 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  They recently me moved to a new home in the [facility] area.  Tanya Mitchell's son Tanya Mitchell is handicapped, and works as an Training and development officer. 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 Tanya Mitchell has 3 grandchildren and [place] 2. 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 Smoking status: Former Smoker    Quit date: 02/14/1964    Years since quitting: 56.0  . Smokeless tobacco: Never Used  Vaping Use  . Vaping Use: Never used  Substance Use Topics  . Alcohol use: Yes    Comment: daily glass of  wine  . Drug use: No     Colonoscopy: 2011  PAP: November 2014  Bone density: January 2013, osteopenia  Lipid panel:   August 2013/Dr. Barnes   No Known Allergies  Current Outpatient Medications  Medication Sig Dispense Refill  . aspirin EC 81 MG tablet Take 1 tablet (81 mg total) by mouth daily.    [name] atorvastatin (LIPITOR) 80 MG tablet TAKE 1 TABLET BY MOUTH DAILY 90 tablet 2  . carvedilol (COREG) 25 MG tablet TAKE ONE TABLET BY MOUTH TWO TIMES DAILY. GENERIC EQUIVALENT FOR COREG 180 tablet 3  . cholecalciferol (VITAMIN D) 1000 UNITS tablet Take 2,000 Units by mouth daily.     . hydrochlorothiazide (HYDRODIURIL) 25 MG tablet TAKE 1 TABLET BY MOUTH DAILY 90 tablet 3  . metFORMIN (GLUCOPHAGE) 500 MG tablet 250 mg twice a day.    . ramipril (ALTACE) 10 MG capsule TAKE 1 CAPSULE BY MOUTH DAILY 90 capsule 2   No current facility-administered medications for this visit.    OBJECTIVE:  White woman who appears younger than stated age  84:   02/02/20 1014  Pulse: 80  Resp: 18  Temp: (!) 97 F (36.1 C)     There is no height or weight on file to calculate BMI.    ECOG FS:1 - Symptomatic but completely ambulatory There were no vitals filed for this visit.     LAB RESULTS:   Lab Results  Component Value Date   WBC 49.7 (H) 02/02/2020   NEUTROABS 34.8 (H) 02/02/2020   HGB 10.9 (L) 02/02/2020   HCT 31.8 (L) 02/02/2020   MCV 93.0 02/02/2020   PLT 208 02/02/2020      Chemistry      Component Value Date/Time   NA 136 01/28/2020 1315   NA 139 11/30/2015 1225   K 4.7 01/28/2020 1315   K 4.6 11/30/2015 1225   CL 95 (L) 01/28/2020 1315   CL 104 01/18/2012 0948   CO2 27 01/28/2020 1315   CO2 27 11/30/2015 1225   BUN 11 01/28/2020 1315   BUN 13.1 11/30/2015 1225   CREATININE 0.58 01/28/2020 1315   CREATININE 0.7 11/30/2015 1225      Component Value Date/Time   CALCIUM 9.6 01/28/2020 1315   CALCIUM 10.0 11/30/2015 1225   ALKPHOS 57 01/28/2020 1315   ALKPHOS 83  11/30/2015 1225   AST 26 01/28/2020 1315   AST 25 11/30/2015 1225   ALT 23 01/28/2020 1315   ALT 27 11/30/2015 1225   BILITOT 0.4 01/28/2020 1315   BILITOT 0.60 11/30/2015 1225      STUDIES: No results found.   ASSESSMENT: 74 y.o. BRCA negative [place] woman   (1) status post left mastectomy with TRAM reconstruction June of 1995 for a T1c N1, stage IIA invasive ductal carcinoma, grade 2, estrogen receptor 97% positive, progesterone receptor 45% positive, treated adjuvantly according to CALGB 9394 with 3 cycles of doxorubicin followed by 3 cycles of cyclophosphamide, then tamoxifen for 5 years, off therapy completed November of 2000  (2) status post right lumpectomy October 2007 for high-grade ductal carcinoma in situ, with negative margins estrogen and progesterone receptor negative, followed by adjuvant radiation therapy   (3) malignant melanoma, T2a N1a = stage IIIA, as follows:  (a) status post shave biopsy from the right upper back 09/15/2011 for a superficial spreading melanoma, Breslow depth 1.45 mm, Clark's level IV, with focal regression but no ulceration.  (b) status post wide excision and sentinel lymph node sampling 10/10/2011 with residual malignant melanoma in situ, but negative margins; the single sentinel lymph node was involved by melanoma with the largest subcapsular deposit measuring 0.22 mm, no evidence of capsular involvement or extracapsular extension  (c) completion nodal dissection was discussed, but not performed in part because the patient had already had a complete left axillary lymph node dissection at the time of her 1995 left mastectomy (10 lymph nodes were removed at that time)  (d) adjuvant interferon was discussed with the patient when she visited [facility] in September 2013, but given that it side effects and marginal benefits the patient declined  (e) BRAF testing has not been done on the original tumor; this was also discussed with the patient at the time of  her [facility] visit, as it might possibly lead to participation in a research protocol; the patient decided not to pursue that option  (4) PET scan 11/19/18/2015 shows nonspecific uptake only at T6.   (a) MRI of the thoracic spine November 2015 showed no evidence of metastatic disease  (  b) repeat PET scan 12/02/2014 shows no residual or recurrent hypermetabolic tumor.  (5) thrombocytosis first noted October 2017, leukocytosis first noted November 2019  (a) bone marrow biopsy 04/03/2018 showed a hypercellular bone marrow with granulocytic and megakaryocytic proliferation, some of the megakaryocytes being small and/or hypolobulated.  There was no increase in blasts, no significant increase in reticulin fibers  (b) cytogenetics from bone marrow biopsy 04/03/2018 showed 46,XX[20].nuc ish (ABL1, BCR)x2  (c) molecular studies 12/18/2017 showed no mutations in JAK2 exons 12 and 14 (including V 617);  MPL exon 10 or CALR exon 9  (d) BCR/ABL 1 drawn 12/18/2017 showed 94% normal nuclei (6% with single fusion)  (e) repeat BCR/ABL 1 on 04/30/2018 showed 100% normal nuclei  (f) repeat BCR/ABL 1 on 02/27/2019: Translocation not detected  (6) bone marrow biopsy on 10/31/2019 shows a hypercellular marrow with myeloid and megakaryocytic hyperplasia but no increase in blasts.  (a) cytogenetics showed no metaphase cells for analysis  (b) Next generation sequencing for myeloid disorders reveals mutations of  ASXL1, GATA2, and U2AF1. These findings support the impression of a  myeloid neoplasm.    PLAN: Dayle now has a normal platelet count but is moderately anemic.  Her white cell count continues to slowly rise and is now in the 45,000 range.  While there are a couple of blasts on the peripheral smear there is no evidence of frank transformation.  Her sequencing studies are quite worrisome but not definitive.  At this point I think reassessment at [facility] is the best idea.  We have set her up to see Dr. Joan Mayans on  02/16/2019.  We have faxed the information to her.  I expect that they will be doing a bone marrow biopsy there and perhaps we will get a definitive diagnosis and treatment plan at this time  I am tentatively going to set [place] up for a virtual visit with me in a couple of weeks just to make sure we keep up  Total encounter time 20 minutes.Chauncey Cruel, MD   02/02/2020 10:15 AM  Oncology and Hematology [facility] [place], [place] [address] Tel. [phone]  Joylene Igo [phone]   I, Wilburn Mylar, am acting as scribe for Dr. Virgie Dad. Ardice Boyan.  I, Lurline Del MD, have reviewed the above documentation for accuracy and completeness, and I agree with the above.   *Total Encounter Time as defined by the Centers for Medicare and Medicaid Services includes, in addition to the face-to-face time of a patient visit (documented in the note above) non-face-to-face time: obtaining and reviewing outside history, ordering and reviewing medications, tests or procedures, care coordination (communications with other health care professionals or caregivers) and documentation in the medical record.

## 2020-02-03 ENCOUNTER — Telehealth: Payer: Self-pay | Admitting: Oncology

## 2020-02-03 NOTE — Telephone Encounter (Signed)
Scheduled appts per 12/20 los. Pt confirmed appt date and time.  

## 2020-02-16 DIAGNOSIS — D471 Chronic myeloproliferative disease: Secondary | ICD-10-CM | POA: Diagnosis not present

## 2020-02-17 DIAGNOSIS — Z01419 Encounter for gynecological examination (general) (routine) without abnormal findings: Secondary | ICD-10-CM | POA: Diagnosis not present

## 2020-02-17 DIAGNOSIS — N393 Stress incontinence (female) (male): Secondary | ICD-10-CM | POA: Diagnosis not present

## 2020-02-19 DIAGNOSIS — Z20822 Contact with and (suspected) exposure to covid-19: Secondary | ICD-10-CM | POA: Diagnosis not present

## 2020-02-19 DIAGNOSIS — Z03818 Encounter for observation for suspected exposure to other biological agents ruled out: Secondary | ICD-10-CM | POA: Diagnosis not present

## 2020-02-24 ENCOUNTER — Other Ambulatory Visit: Payer: Self-pay | Admitting: Oncology

## 2020-02-25 ENCOUNTER — Other Ambulatory Visit: Payer: Self-pay | Admitting: Oncology

## 2020-03-02 ENCOUNTER — Other Ambulatory Visit: Payer: Self-pay | Admitting: Oncology

## 2020-03-03 ENCOUNTER — Other Ambulatory Visit: Payer: Self-pay | Admitting: Oncology

## 2020-03-05 NOTE — Progress Notes (Signed)
[facility] New Patient Evaluation and Consultation  Referring Provider: Bobbye Charleston, MD PCP: Leighton Ruff, MD Date of Service: 03/10/2020  SUBJECTIVE Chief Complaint: New Patient (Initial Visit) Tanya Mitchell is a 75 y.o. female complains of urinary incontinence.)  History of Present Illness: Tanya Mitchell is a 75 y.o. White or Caucasian female seen in consultation at the request of Dr. Philis Pique for evaluation of stress incontinence.    Review of records significant for: Has leakage of urine once a week with lifting. She is interested in pelvic floor PT.   Urinary Symptoms: Leaks urine with lifting Leaks 2 time(s) per day, most days.  Pad use: 2 liners/ mini-pads per day.   She is bothered by her UI symptoms.  Day time voids 4.  Nocturia: 0-1 times per night to void. Voiding dysfunction: she does not empty her bladder well.  does not use a catheter to empty bladder.  When urinating, she feels she has no difficulties  UTIs: 2 UTI's in the last year.   Denies history of blood in urine and kidney or bladder stones  Pelvic Organ Prolapse Symptoms:                  She Denies a feeling of a bulge the vaginal area.   Bowel Symptom: Bowel movements: 1-2 time(s) per day Stool consistency: soft  Straining: no.  Splinting: no.  Incomplete evacuation: no.  She Denies accidental bowel leakage / fecal incontinence Bowel regimen: none Last colonoscopy: Date Dec 2021, Results negative  Sexual Function Sexually active: yes.  Pain with sex: No  Pelvic Pain Denies pelvic pain   Past Medical History:  Past Medical History:  Diagnosis Date  . Breast cancer ([place]) 1995 and 2007  . Cardiomyopathy secondary to chemotherapy [facility])    has improved   . Hyperlipidemia   . Hypertension    under control, has been on med. x 3 yrs.  . Melanoma in situ of back ([place]) 09/2011   left  . Personal history of chemotherapy   . Personal history of radiation  therapy   . Skin cancer 2013   Melanoma     Past Surgical History:   Past Surgical History:  Procedure Laterality Date  . BREAST LUMPECTOMY  12/21/2005   right  . MASTECTOMY  1995   with breast reconstruction - left     Past OB/GYN History: G2 P2 Vaginal deliveries: 2,  Forceps/ Vacuum deliveries: 0, Cesarean section: 0 Menopausal: Yes, at age 45, Denies vaginal bleeding since menopause Contraception: n/a. Last pap smear was 2021.  Any history of abnormal pap smears: no.   Medications: She has a current medication list which includes the following prescription(s): atorvastatin, carvedilol, cholecalciferol, hydrochlorothiazide, metformin, and ramipril.   Allergies: Patient has No Known Allergies.   Social History:  Social History   Tobacco Use  . Smoking status: Former Smoker    Quit date: 02/14/1964    Years since quitting: 56.1  . Smokeless tobacco: Never Used  Vaping Use  . Vaping Use: Never used  Substance Use Topics  . Alcohol use: Yes    Comment: daily glass of wine  . Drug use: No    Relationship status: married She lives with husband.   She is not employed.   Family History:   Family History  Problem Relation Age of Onset  . Lung cancer Father   . Cancer Father        lung  . Autism Son   .  Breast cancer Daughter      Review of Systems: Review of Systems  Constitutional: Negative for fever, malaise/fatigue and weight loss.  Respiratory: Negative for cough, shortness of breath and wheezing.   Cardiovascular: Negative for chest pain, palpitations and leg swelling.  Gastrointestinal: Negative for abdominal pain and blood in stool.  Genitourinary: Negative for dysuria.  Musculoskeletal: Negative for myalgias.  Skin: Negative for rash.  Neurological: Negative for dizziness and headaches.  Endo/Heme/Allergies: Does not bruise/bleed easily.  Psychiatric/Behavioral: Negative for depression. The patient is not nervous/anxious.       OBJECTIVE Physical Exam: Vitals:   03/10/20 1440  BP: (!) 151/78  Pulse: 84  Weight: 123 lb (55.8 kg)  Height: 5\' 1"  (1.549 m)    Physical Exam Constitutional:      General: She is not in acute distress. Pulmonary:     Effort: Pulmonary effort is normal.  Abdominal:     General: There is no distension.     Palpations: Abdomen is soft.     Tenderness: There is no abdominal tenderness. There is no rebound.  Musculoskeletal:        General: No swelling. Normal range of motion.  Skin:    General: Skin is warm and dry.     Findings: No rash.  Neurological:     Mental Status: She is alert and oriented to person, place, and time.  Psychiatric:        Mood and Affect: Mood normal.        Behavior: Behavior normal.      GU / Detailed Urogynecologic Evaluation:  Pelvic Exam: Normal external female genitalia; Bartholin's and Skene's glands normal in appearance; urethral meatus normal in appearance, no urethral masses or discharge.   CST: negative  Speculum exam reveals normal vaginal mucosa with atrophy. Cervix normal appearance. Uterus normal single, nontender. Adnexa no mass, fullness, tenderness.      Pelvic floor strength III/V  Pelvic floor musculature: Right levator non-tender, Right obturator non-tender, Left levator non-tender, Left obturator non-tender  POP-Q:   POP-Q  -1.5                                            Aa   -1.5                                           Ba  -8                                              C   3                                            Gh  3.5                                            Pb  10  tvl   -2                                            Ap  -2                                            Bp  -9                                              D     Rectal Exam:  Normal external rectum  Post-Void Residual (PVR) by Bladder Scan: In order to evaluate bladder  emptying, we discussed obtaining a postvoid residual and she agreed to this procedure.  Procedure: The ultrasound unit was placed on the patient's abdomen in the suprapubic region after the patient had voided. A PVR of 14 ml was obtained by bladder scan.  Laboratory Results: POC urine: negative  I visualized the urine specimen, noting the specimen to be clear yellow  ASSESSMENT AND PLAN Ms. Constantine is a 75 y.o. with:  1. SUI (stress urinary incontinence, female)   2. Urinary frequency     For treatment of stress urinary incontinence,  non-surgical options include expectant management, weight loss, physical therapy, as well as a pessary.  Surgical options include a midurethral sling, Burch urethropexy, and transurethral injection of a bulking agent. - She is interested in starting with physical therapy and performing kegel exercises at home. She was able to demonstrate them on exam today. Referral also placed to PT. Handout provided on home exercises.   She will return in 3 months to reassess progress.   Jaquita Folds, MD   Medical Decision Making:  - Reviewed/ ordered a clinical laboratory test - Review and summation of prior records

## 2020-03-06 NOTE — Progress Notes (Signed)
ID: Tanya Rake Carberry OB: 1946/01/24  MR#: [id number]  CSN#:[id number]  Patient Care Team: Leighton Ruff, MD as PCP - General (Family Medicine) Burtis Junes, NP as Nurse Practitioner (Nurse Practitioner) Lyrik Buresh, Virgie Dad, MD as Consulting Physician (Oncology) Bobbye Charleston, MD as Consulting Physician (Obstetrics and Gynecology) Janyth Contes, MD as Referring Physician (Hematology and Oncology) OTHER MD:  I connected with Tanya Mitchell on 03/08/20 at 11:00 AM EST by video enabled telemedicine visit and verified that I am speaking with the correct person using two identifiers.   I discussed the limitations, risks, security and privacy concerns of performing an evaluation and management service by telemedicine and the availability of in-person appointments. I also discussed with the patient that there may be a patient responsible charge related to this service. The patient expressed understanding and agreed to proceed.   Other persons participating in the visit and their role in the encounter: None  Patient's location: Home Provider's location: [place]:   1)  Hx Left Breast Cancer 1995 (s/p left mastectomy)       2) Hx Right Breast Cancer 2007                   3)  Hx Melanoma, Stage IIIA       4) leukocytosis/thrombocytosis      CURRENT TREATMENT: observation   INTERVAL HISTORY: Tanya Mitchell was contacted today for follow-up of her leukocytosis.  She also has a remote history of breast cancer. She continues under observation.  Since her last visit, she was seen by Dr. Joan Mayans at [facility] on 02/25/2020, who reviewed her lab work and the molecular studies that we obtained.  She has set Tanya Mitchell up for a bone marrow biopsy and further follow-up in February  Her most recent right screening mammogram from 10/13/2019 at [facility] was negative.   REVIEW OF SYSTEMS: Tanya Mitchell just returned from a double [place] cruise, each a week, which she  took back-to-back with her husband.  She enjoyed that a great deal.  She exercises by walking and she walks 3 to 5 days a week.  A detailed review of systems today was otherwise entirely benign   COVID 19 VACCINATION STATUS: [place] x2, most recently 04/2019   HISTORY OF PRESENT ILLNESS: From the earlier summary:  Tanya Mitchell's history of breast cancer dates back to June of 1995, when she had left modified radical mastectomy under [facility] for a pT1c pN1, stage IIA invasive ductal carcinoma, grade 2, estrogen receptor 97% positive, progesterone receptor 45% positive, treated according to CALGB 9394, sequential arm (doxorubicin x3, fourth dose apparently omitted, followed by high-dose cyclophosphamide x3), followed by tamoxifen for 5 years. There has been no evidence of disease recurrence.  Further, in October of 2007 she underwent right lumpectomy for a ductal carcinoma in situ, high-grade, estrogen and progesterone receptor negative, followed by adjuvant radiation.  In July of 2013 the patient was noted to have an irregular mole in her left upper back. It is not clear to me whether this might be related to her prior left breast irradiation. Shave biopsy of this area 09/15/2011 by Dr. Derrel Nip [phone]) showed a superficial spreading malignant melanoma, with Breslow depth 1.45 mm, Clark's level IV. There was brisk host response and 8 mitoses per high power field were noted. There was focal regression but no definitive ulceration. There was no satellitosis. There was no vascular and urgent. Margins were positive, but cleared by wide excision  under Dr Stark Klein 10/10/2011. There was some residual malignant melanoma in situ but margins to 2 cm were obtained, with advancement flap closure of the skin defect. In addition left axillary sentinel lymph node mapping was performed showing metastatic melanoma in the single lymph node removed.  Tanya Mitchell sought a second opinion at [facility] under Dr Joaquim Lai Collichio.  There was a full discussion regarding completion nodal resection, use of adjuvant interferon, and BRAF testing with a view to participation in a research protocol then available. The patient considered all these options and after much discussion the decision was made not to pursue full nodal dissection, partly because the area in question had a ready undergone surgery and radiation. The patient opted against interferon adjuvant therapy because of its very marginal benefit and significant side effects. She declined consideration of a research study and BRAF testing was not performed.  Her subsequent history is as detailed below.   PAST MEDICAL HISTORY: Past Medical History:  Diagnosis Date  . Breast cancer ([place]) 1995 and 2007  . Cardiomyopathy secondary to chemotherapy [facility])    has improved   . Hyperlipidemia   . Hypertension    under control, has been on med. x 3 yrs.  . Melanoma in situ of back ([place]) 09/2011   left  . Personal history of chemotherapy   . Personal history of radiation therapy   . Skin cancer 2013   Melanoma    PAST SURGICAL HISTORY: Past Surgical History:  Procedure Laterality Date  . BREAST LUMPECTOMY  12/21/2005   right  . MASTECTOMY  1995   with breast reconstruction - left    FAMILY HISTORY Family History  Problem Relation Age of Onset  . Lung cancer Father   . Cancer Father        lung  . Autism Son   . Breast cancer Daughter    the patient's father died from complications of lung cancer at the age of 68. He was a heavy smoker. The patient's mother died at the age of 5. Tanya Mitchell had no siblings. She underwent genetic testing for breast and ovarian cancer panel April of 2014. There were no demonstrable mutations in the BRCA or the other genes in the panel   GYNECOLOGIC HISTORY:  Menarche age 22, first live birth age 75. She is GX P2. She entered menopause at age 28, when she received her chemotherapy. She did not use hormone replacement. She did use birth  control remotely, for approximately 14 years, with no complications.   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  She also volunteered at the [employer] [facility] [employer]. 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  They recently me moved [place] a new home in the [facility] area.  Tanya Mitchell's son Tanya Mitchell is handicapped, and works as an Training and development officer. [name] is Brewing technologist limited. Almer has 3 grandchildren and Tanya Mitchell 2. 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 Smoking status: Former Smoker    Quit date: 02/14/1964    Years since quitting: 56.1  . Smokeless tobacco: Never Used  Vaping Use  . Vaping Use: Never used  Substance Use Topics  . Alcohol use: Yes    Comment: daily glass of wine  . Drug use: No  Colonoscopy: 2011  PAP: November 2014  Bone density: January 2013, osteopenia  Lipid panel:   August 2013/Dr. Barnes   No Known Allergies  Current Outpatient Medications  Medication Sig Dispense Refill  . aspirin EC 81 MG tablet Take 1 tablet (81 mg total) by mouth daily.    [name] atorvastatin (LIPITOR) 80 MG tablet TAKE 1 TABLET BY MOUTH DAILY 90 tablet 2  . carvedilol (COREG) 25 MG tablet TAKE ONE TABLET BY MOUTH TWO TIMES DAILY. GENERIC EQUIVALENT FOR COREG 180 tablet 3  . cholecalciferol (VITAMIN D) 1000 UNITS tablet Take 2,000 Units by mouth daily.     . hydrochlorothiazide (HYDRODIURIL) 25 MG tablet TAKE 1 TABLET BY MOUTH DAILY 90 tablet 3  . metFORMIN (GLUCOPHAGE) 500 MG tablet 250 mg twice a day.    . ramipril (ALTACE) 10 MG capsule TAKE 1 CAPSULE BY MOUTH DAILY 90 capsule 2   No current facility-administered medications for this visit.    OBJECTIVE: White woman who appears younger than stated age  There were no  vitals filed for this visit.   There is no height or weight on file to calculate BMI.    ECOG FS:1 - Symptomatic but completely ambulatory There were no vitals filed for this visit.  Telemedicine 03/08/2020   LAB RESULTS:   Lab Results  Component Value Date   WBC 49.7 (H) 02/02/2020   NEUTROABS 34.8 (H) 02/02/2020   HGB 10.9 (L) 02/02/2020   HCT 31.8 (L) 02/02/2020   MCV 93.0 02/02/2020   PLT 208 02/02/2020      Chemistry      Component Value Date/Time   NA 136 01/28/2020 1315   NA 139 11/30/2015 1225   K 4.7 01/28/2020 1315   K 4.6 11/30/2015 1225   CL 95 (L) 01/28/2020 1315   CL 104 01/18/2012 0948   CO2 27 01/28/2020 1315   CO2 27 11/30/2015 1225   BUN 11 01/28/2020 1315   BUN 13.1 11/30/2015 1225   CREATININE 0.58 01/28/2020 1315   CREATININE 0.7 11/30/2015 1225      Component Value Date/Time   CALCIUM 9.6 01/28/2020 1315   CALCIUM 10.0 11/30/2015 1225   ALKPHOS 57 01/28/2020 1315   ALKPHOS 83 11/30/2015 1225   AST 26 01/28/2020 1315   AST 25 11/30/2015 1225   ALT 23 01/28/2020 1315   ALT 27 11/30/2015 1225   BILITOT 0.4 01/28/2020 1315   BILITOT 0.60 11/30/2015 1225      STUDIES: No results found.   ASSESSMENT: 75 y.o. BRCA negative [place] woman   (1) status post left mastectomy with TRAM reconstruction June of 1995 for a T1c N1, stage IIA invasive ductal carcinoma, grade 2, estrogen receptor 97% positive, progesterone receptor 45% positive, treated adjuvantly according to CALGB 9394 with 3 cycles of doxorubicin followed by 3 cycles of cyclophosphamide, then tamoxifen for 5 years, off therapy completed November of 2000  (2) status post right lumpectomy October 2007 for high-grade ductal carcinoma in situ, with negative margins estrogen and progesterone receptor negative, followed by adjuvant radiation therapy   (3) malignant melanoma, T2a N1a = stage IIIA, as follows:  (a) status post shave biopsy from the right upper back 09/15/2011 for a  superficial spreading melanoma, Breslow depth 1.45 mm, Clark's level IV, with focal regression but no ulceration.  (b) status post wide excision and sentinel lymph node sampling 10/10/2011 with residual malignant melanoma in situ, but negative margins; the single sentinel lymph node was involved by melanoma with the largest  subcapsular deposit measuring 0.22 mm, no evidence of capsular involvement or extracapsular extension  (c) completion nodal dissection was discussed, but not performed in part because the patient had already had a complete left axillary lymph node dissection at the time of her 1995 left mastectomy (10 lymph nodes were removed at that time)  (d) adjuvant interferon was discussed with the patient when she visited [facility] in September 2013, but given that it side effects and marginal benefits the patient declined  (e) BRAF testing has not been done on the original tumor; this was also discussed with the patient at the time of her [facility] visit, as it might possibly lead to participation in a research protocol; the patient decided not to pursue that option  (4) PET scan 11/19/18/2015 shows nonspecific uptake only at T6.   (a) MRI of the thoracic spine November 2015 showed no evidence of metastatic disease  (b) repeat PET scan 12/02/2014 shows no residual or recurrent hypermetabolic tumor.  (5) thrombocytosis first noted October 2017, leukocytosis first noted November 2019  (a) bone marrow biopsy 04/03/2018 showed a hypercellular bone marrow with granulocytic and megakaryocytic proliferation, some of the megakaryocytes being small and/or hypolobulated.  There was no increase in blasts, no significant increase in reticulin fibers  (b) cytogenetics from bone marrow biopsy 04/03/2018 showed 46,XX[20].nuc ish (ABL1, BCR)x2  (c) molecular studies 12/18/2017 showed no mutations in JAK2 exons 12 and 14 (including V 617);  MPL exon 10 or CALR exon 9  (d) BCR/ABL 1 drawn 12/18/2017 showed 94% normal  nuclei (6% with single fusion)  (e) repeat BCR/ABL 1 on 04/30/2018 showed 100% normal nuclei  (f) repeat BCR/ABL 1 on 02/27/2019: Translocation not detected  (6) bone marrow biopsy on 10/31/2019 shows a hypercellular marrow with myeloid and megakaryocytic hyperplasia but no increase in blasts.  (a) cytogenetics showed no metaphase cells for analysis  (b) Next generation sequencing for myeloid disorders reveals mutations of  ASXL1, GATA2, and U2AF1. These findings support the impression of a  myeloid neoplasm.     PLAN: [place] is now a little over 2 years out since we started monitoring her elevated blood counts.  Currently her platelet count is in the normal range, she has very minimal anemia, and her white cell count is greater than 50,000.  This is going to be further evaluated with repeat bone marrow biopsy February at [facility].  She had an appointment with me in March but given that she will be intensely worked up in February at [facility] we are going to move that back to May  She knows to call for any other issue that may develop before the next visit.   Chauncey Cruel, MD   03/08/2020 11:08 AM  [facility] [address], [place] [address] Tel. [phone]  Joylene Igo [phone]   I, Wilburn Mylar, am acting as scribe for Dr. Sarajane Jews C. Rebekah Zackery.  I, Lurline Del MD, have reviewed the above documentation for accuracy and completeness, and I agree with the above.   *Total Encounter Time as defined by the Centers for Medicare and Medicaid Services includes, in addition to the face-to-face time of a patient visit (documented in the note above) non-face-to-face time: obtaining and reviewing outside history, ordering and reviewing medications, tests or procedures, care coordination (communications with other health care professionals or caregivers) and documentation in the medical record.

## 2020-03-08 ENCOUNTER — Inpatient Hospital Stay: Payer: Medicare Other | Attending: Oncology | Admitting: Oncology

## 2020-03-08 DIAGNOSIS — D75839 Thrombocytosis, unspecified: Secondary | ICD-10-CM

## 2020-03-08 DIAGNOSIS — D0511 Intraductal carcinoma in situ of right breast: Secondary | ICD-10-CM

## 2020-03-08 DIAGNOSIS — C4359 Malignant melanoma of other part of trunk: Secondary | ICD-10-CM

## 2020-03-08 DIAGNOSIS — I428 Other cardiomyopathies: Secondary | ICD-10-CM | POA: Diagnosis not present

## 2020-03-08 DIAGNOSIS — D72829 Elevated white blood cell count, unspecified: Secondary | ICD-10-CM | POA: Diagnosis not present

## 2020-03-09 ENCOUNTER — Telehealth: Payer: Self-pay | Admitting: Oncology

## 2020-03-09 DIAGNOSIS — H5203 Hypermetropia, bilateral: Secondary | ICD-10-CM | POA: Diagnosis not present

## 2020-03-09 NOTE — Telephone Encounter (Signed)
Scheduled appts per 1/24 los. Left voicemail with appt date and time.

## 2020-03-10 ENCOUNTER — Encounter: Payer: Self-pay | Admitting: Obstetrics and Gynecology

## 2020-03-10 ENCOUNTER — Other Ambulatory Visit: Payer: Self-pay

## 2020-03-10 ENCOUNTER — Ambulatory Visit (INDEPENDENT_AMBULATORY_CARE_PROVIDER_SITE_OTHER): Payer: Medicare Other | Admitting: Obstetrics and Gynecology

## 2020-03-10 VITALS — BP 151/78 | HR 84 | Ht 61.0 in | Wt 123.0 lb

## 2020-03-10 DIAGNOSIS — R350 Frequency of micturition: Secondary | ICD-10-CM

## 2020-03-10 DIAGNOSIS — N393 Stress incontinence (female) (male): Secondary | ICD-10-CM

## 2020-03-10 LAB — POCT URINALYSIS DIPSTICK
Appearance: NORMAL
Bilirubin, UA: NEGATIVE
Blood, UA: NEGATIVE
Glucose, UA: NEGATIVE
Ketones, UA: NEGATIVE
Leukocytes, UA: NEGATIVE
Nitrite, UA: NEGATIVE
Protein, UA: NEGATIVE
Spec Grav, UA: 1.01 (ref 1.010–1.025)
Urobilinogen, UA: 0.2 E.U./dL
pH, UA: 5 (ref 5.0–8.0)

## 2020-03-18 DIAGNOSIS — Z1389 Encounter for screening for other disorder: Secondary | ICD-10-CM | POA: Diagnosis not present

## 2020-03-18 DIAGNOSIS — Z0000 Encounter for general adult medical examination without abnormal findings: Secondary | ICD-10-CM | POA: Diagnosis not present

## 2020-03-19 ENCOUNTER — Other Ambulatory Visit: Payer: Self-pay | Admitting: Family Medicine

## 2020-03-19 DIAGNOSIS — E2839 Other primary ovarian failure: Secondary | ICD-10-CM

## 2020-03-22 ENCOUNTER — Other Ambulatory Visit: Payer: Self-pay | Admitting: Adult Health

## 2020-03-22 DIAGNOSIS — Z1231 Encounter for screening mammogram for malignant neoplasm of breast: Secondary | ICD-10-CM

## 2020-03-29 DIAGNOSIS — I10 Essential (primary) hypertension: Secondary | ICD-10-CM | POA: Diagnosis not present

## 2020-03-29 DIAGNOSIS — Z853 Personal history of malignant neoplasm of breast: Secondary | ICD-10-CM | POA: Diagnosis not present

## 2020-03-29 DIAGNOSIS — E7800 Pure hypercholesterolemia, unspecified: Secondary | ICD-10-CM | POA: Diagnosis not present

## 2020-03-29 DIAGNOSIS — E119 Type 2 diabetes mellitus without complications: Secondary | ICD-10-CM | POA: Diagnosis not present

## 2020-03-30 DIAGNOSIS — D75839 Thrombocytosis, unspecified: Secondary | ICD-10-CM | POA: Diagnosis not present

## 2020-03-30 DIAGNOSIS — D72829 Elevated white blood cell count, unspecified: Secondary | ICD-10-CM | POA: Diagnosis not present

## 2020-03-30 DIAGNOSIS — D471 Chronic myeloproliferative disease: Secondary | ICD-10-CM | POA: Diagnosis not present

## 2020-03-30 DIAGNOSIS — D75838 Other thrombocytosis: Secondary | ICD-10-CM | POA: Diagnosis not present

## 2020-03-30 DIAGNOSIS — D72828 Other elevated white blood cell count: Secondary | ICD-10-CM | POA: Diagnosis not present

## 2020-04-12 ENCOUNTER — Other Ambulatory Visit: Payer: Self-pay | Admitting: Nurse Practitioner

## 2020-04-19 ENCOUNTER — Other Ambulatory Visit: Payer: Medicare Other

## 2020-04-19 ENCOUNTER — Other Ambulatory Visit: Payer: Self-pay | Admitting: Oncology

## 2020-04-21 ENCOUNTER — Other Ambulatory Visit: Payer: Self-pay | Admitting: Oncology

## 2020-04-21 ENCOUNTER — Ambulatory Visit: Payer: Medicare Other | Admitting: Oncology

## 2020-04-21 DIAGNOSIS — T451X5A Adverse effect of antineoplastic and immunosuppressive drugs, initial encounter: Secondary | ICD-10-CM

## 2020-04-21 DIAGNOSIS — D6481 Anemia due to antineoplastic chemotherapy: Secondary | ICD-10-CM | POA: Insufficient documentation

## 2020-04-22 ENCOUNTER — Telehealth: Payer: Self-pay | Admitting: Oncology

## 2020-04-22 NOTE — Telephone Encounter (Signed)
Scheduled appts per 3/9 sch msg. Pt aware.

## 2020-04-27 ENCOUNTER — Inpatient Hospital Stay: Payer: Medicare Other

## 2020-04-27 ENCOUNTER — Other Ambulatory Visit: Payer: Self-pay | Admitting: Oncology

## 2020-04-27 ENCOUNTER — Other Ambulatory Visit: Payer: Self-pay

## 2020-04-27 ENCOUNTER — Inpatient Hospital Stay: Payer: Medicare Other | Attending: Oncology

## 2020-04-27 VITALS — BP 133/65 | HR 77 | Temp 98.3°F | Resp 16

## 2020-04-27 DIAGNOSIS — D469 Myelodysplastic syndrome, unspecified: Secondary | ICD-10-CM | POA: Diagnosis not present

## 2020-04-27 DIAGNOSIS — D0511 Intraductal carcinoma in situ of right breast: Secondary | ICD-10-CM

## 2020-04-27 DIAGNOSIS — D72829 Elevated white blood cell count, unspecified: Secondary | ICD-10-CM

## 2020-04-27 DIAGNOSIS — D72820 Lymphocytosis (symptomatic): Secondary | ICD-10-CM

## 2020-04-27 DIAGNOSIS — D75839 Thrombocytosis, unspecified: Secondary | ICD-10-CM

## 2020-04-27 DIAGNOSIS — D72828 Other elevated white blood cell count: Secondary | ICD-10-CM

## 2020-04-27 LAB — CBC WITH DIFFERENTIAL/PLATELET
Abs Immature Granulocytes: 7.6 10*3/uL — ABNORMAL HIGH (ref 0.00–0.07)
Band Neutrophils: 13 %
Basophils Absolute: 0 10*3/uL (ref 0.0–0.1)
Basophils Relative: 0 %
Eosinophils Absolute: 0 10*3/uL (ref 0.0–0.5)
Eosinophils Relative: 0 %
HCT: 27.3 % — ABNORMAL LOW (ref 36.0–46.0)
Hemoglobin: 9.5 g/dL — ABNORMAL LOW (ref 12.0–15.0)
Lymphocytes Relative: 2 %
Lymphs Abs: 0.9 10*3/uL (ref 0.7–4.0)
MCH: 35.7 pg — ABNORMAL HIGH (ref 26.0–34.0)
MCHC: 34.8 g/dL (ref 30.0–36.0)
MCV: 102.6 fL — ABNORMAL HIGH (ref 80.0–100.0)
Metamyelocytes Relative: 4 %
Monocytes Absolute: 0.5 10*3/uL (ref 0.1–1.0)
Monocytes Relative: 1 %
Myelocytes: 12 %
Neutro Abs: 38.3 10*3/uL — ABNORMAL HIGH (ref 1.7–7.7)
Neutrophils Relative %: 68 %
Platelets: 264 10*3/uL (ref 150–400)
RBC: 2.66 MIL/uL — ABNORMAL LOW (ref 3.87–5.11)
RDW: 18.7 % — ABNORMAL HIGH (ref 11.5–15.5)
WBC: 47.3 10*3/uL — ABNORMAL HIGH (ref 4.0–10.5)
nRBC: 0 % (ref 0.0–0.2)

## 2020-04-27 MED ORDER — DARBEPOETIN ALFA 300 MCG/0.6ML IJ SOSY
PREFILLED_SYRINGE | INTRAMUSCULAR | Status: AC
  Filled 2020-04-27: qty 0.6

## 2020-04-27 MED ORDER — DARBEPOETIN ALFA 300 MCG/0.6ML IJ SOSY
300.0000 ug | PREFILLED_SYRINGE | Freq: Once | INTRAMUSCULAR | Status: AC
  Administered 2020-04-27: 300 ug via SUBCUTANEOUS

## 2020-04-27 NOTE — Patient Instructions (Signed)
Darbepoetin Alfa injection What is this medicine? DARBEPOETIN ALFA (dar be POE e tin AL fa) helps your body make more red blood cells. It is used to treat anemia caused by chronic kidney failure and chemotherapy. This medicine may be used for other purposes; ask your health care provider or pharmacist if you have questions. COMMON BRAND NAME(S): Aranesp What should I tell my health care provider before I take this medicine? They need to know if you have any of these conditions:  blood clotting disorders or history of blood clots  cancer patient not on chemotherapy  cystic fibrosis  heart disease, such as angina, heart failure, or a history of a heart attack  hemoglobin level of 12 g/dL or greater  high blood pressure  low levels of folate, iron, or vitamin B12  seizures  an unusual or allergic reaction to darbepoetin, erythropoietin, albumin, hamster proteins, latex, other medicines, foods, dyes, or preservatives  pregnant or trying to get pregnant  breast-feeding How should I use this medicine? This medicine is for injection into a vein or under the skin. It is usually given by a health care professional in a hospital or clinic setting. If you get this medicine at home, you will be taught how to prepare and give this medicine. Use exactly as directed. Take your medicine at regular intervals. Do not take your medicine more often than directed. It is important that you put your used needles and syringes in a special sharps container. Do not put them in a trash can. If you do not have a sharps container, call your pharmacist or healthcare provider to get one. A special MedGuide will be given to you by the pharmacist with each prescription and refill. Be sure to read this information carefully each time. Talk to your pediatrician regarding the use of this medicine in children. While this medicine may be used in children as young as 1 month of age for selected conditions, precautions do  apply. Overdosage: If you think you have taken too much of this medicine contact a poison control center or emergency room at once. NOTE: This medicine is only for you. Do not share this medicine with others. What if I miss a dose? If you miss a dose, take it as soon as you can. If it is almost time for your next dose, take only that dose. Do not take double or extra doses. What may interact with this medicine? Do not take this medicine with any of the following medications:  epoetin alfa This list may not describe all possible interactions. Give your health care provider a list of all the medicines, herbs, non-prescription drugs, or dietary supplements you use. Also tell them if you smoke, drink alcohol, or use illegal drugs. Some items may interact with your medicine. What should I watch for while using this medicine? Your condition will be monitored carefully while you are receiving this medicine. You may need blood work done while you are taking this medicine. This medicine may cause a decrease in vitamin B6. You should make sure that you get enough vitamin B6 while you are taking this medicine. Discuss the foods you eat and the vitamins you take with your health care professional. What side effects may I notice from receiving this medicine? Side effects that you should report to your doctor or health care professional as soon as possible:  allergic reactions like skin rash, itching or hives, swelling of the face, lips, or tongue  breathing problems  changes in   vision  chest pain  confusion, trouble speaking or understanding  feeling faint or lightheaded, falls  high blood pressure  muscle aches or pains  pain, swelling, warmth in the leg  rapid weight gain  severe headaches  sudden numbness or weakness of the face, arm or leg  trouble walking, dizziness, loss of balance or coordination  seizures (convulsions)  swelling of the ankles, feet, hands  unusually weak or  tired Side effects that usually do not require medical attention (report to your doctor or health care professional if they continue or are bothersome):  diarrhea  fever, chills (flu-like symptoms)  headaches  nausea, vomiting  redness, stinging, or swelling at site where injected This list may not describe all possible side effects. Call your doctor for medical advice about side effects. You may report side effects to FDA at 1-800-FDA-1088. Where should I keep my medicine? Keep out of the reach of children. Store in a refrigerator between 2 and 8 degrees C (36 and 46 degrees F). Do not freeze. Do not shake. Throw away any unused portion if using a single-dose vial. Throw away any unused medicine after the expiration date. NOTE: This sheet is a summary. It may not cover all possible information. If you have questions about this medicine, talk to your doctor, pharmacist, or health care provider.  2021 Elsevier/Gold Standard (2017-02-14 16:44:20)  

## 2020-04-27 NOTE — Progress Notes (Signed)
I dropped into say hello to [facility] while she received her first dose of Aranesp.  She has been started on Hydrea 500 mg.  So far she is tolerating it well.  After a couple of Aranesp doses we may try to increase the dose to thousand depending as tolerated.  I did review her peripheral blood today there was at least one blast but really there is just about everything in the peripheral blood at present.  The bone marrow biopsy at [facility] was very reassuring.  For the full evaluation and plan please see Dr. Tamela Gammon note in [facility]

## 2020-04-28 LAB — PATHOLOGIST SMEAR REVIEW

## 2020-05-05 DIAGNOSIS — Z85820 Personal history of malignant melanoma of skin: Secondary | ICD-10-CM | POA: Diagnosis not present

## 2020-05-05 DIAGNOSIS — L821 Other seborrheic keratosis: Secondary | ICD-10-CM | POA: Diagnosis not present

## 2020-05-05 DIAGNOSIS — D225 Melanocytic nevi of trunk: Secondary | ICD-10-CM | POA: Diagnosis not present

## 2020-05-05 DIAGNOSIS — D2261 Melanocytic nevi of right upper limb, including shoulder: Secondary | ICD-10-CM | POA: Diagnosis not present

## 2020-05-07 ENCOUNTER — Other Ambulatory Visit: Payer: Self-pay

## 2020-05-07 DIAGNOSIS — D0511 Intraductal carcinoma in situ of right breast: Secondary | ICD-10-CM

## 2020-05-11 ENCOUNTER — Inpatient Hospital Stay: Payer: Medicare Other

## 2020-05-11 ENCOUNTER — Other Ambulatory Visit: Payer: Self-pay

## 2020-05-11 VITALS — BP 122/62 | HR 73

## 2020-05-11 DIAGNOSIS — D75839 Thrombocytosis, unspecified: Secondary | ICD-10-CM

## 2020-05-11 DIAGNOSIS — D0511 Intraductal carcinoma in situ of right breast: Secondary | ICD-10-CM

## 2020-05-11 DIAGNOSIS — D469 Myelodysplastic syndrome, unspecified: Secondary | ICD-10-CM | POA: Diagnosis not present

## 2020-05-11 DIAGNOSIS — D72828 Other elevated white blood cell count: Secondary | ICD-10-CM

## 2020-05-11 DIAGNOSIS — D72829 Elevated white blood cell count, unspecified: Secondary | ICD-10-CM

## 2020-05-11 LAB — CBC WITH DIFFERENTIAL (CANCER CENTER ONLY)
Abs Immature Granulocytes: 0.9 10*3/uL — ABNORMAL HIGH (ref 0.00–0.07)
Basophils Absolute: 0 10*3/uL (ref 0.0–0.1)
Basophils Relative: 0 %
Eosinophils Absolute: 0.1 10*3/uL (ref 0.0–0.5)
Eosinophils Relative: 1 %
HCT: 29.5 % — ABNORMAL LOW (ref 36.0–46.0)
Hemoglobin: 10.1 g/dL — ABNORMAL LOW (ref 12.0–15.0)
Immature Granulocytes: 7 %
Lymphocytes Relative: 18 %
Lymphs Abs: 2.2 10*3/uL (ref 0.7–4.0)
MCH: 36.2 pg — ABNORMAL HIGH (ref 26.0–34.0)
MCHC: 34.2 g/dL (ref 30.0–36.0)
MCV: 105.7 fL — ABNORMAL HIGH (ref 80.0–100.0)
Monocytes Absolute: 0.2 10*3/uL (ref 0.1–1.0)
Monocytes Relative: 1 %
Neutro Abs: 9 10*3/uL — ABNORMAL HIGH (ref 1.7–7.7)
Neutrophils Relative %: 73 %
Platelet Count: 266 10*3/uL (ref 150–400)
RBC: 2.79 MIL/uL — ABNORMAL LOW (ref 3.87–5.11)
RDW: 20 % — ABNORMAL HIGH (ref 11.5–15.5)
WBC Count: 12.3 10*3/uL — ABNORMAL HIGH (ref 4.0–10.5)
nRBC: 0 % (ref 0.0–0.2)

## 2020-05-11 LAB — SAVE SMEAR(SSMR), FOR PROVIDER SLIDE REVIEW

## 2020-05-11 MED ORDER — DARBEPOETIN ALFA 300 MCG/0.6ML IJ SOSY
PREFILLED_SYRINGE | INTRAMUSCULAR | Status: AC
  Filled 2020-05-11: qty 0.6

## 2020-05-11 MED ORDER — DARBEPOETIN ALFA 300 MCG/0.6ML IJ SOSY: 300.0000 ug | PREFILLED_SYRINGE | Freq: Once | INTRAMUSCULAR | Status: DC

## 2020-05-11 NOTE — Progress Notes (Signed)
Per desk nurse, no aranesp injection today. Parameters not met. Hgb 10.1

## 2020-05-11 NOTE — Progress Notes (Signed)
No Aranesp per Dr Jana Hakim d/t hgb 10.1. Lexie, LPN aware.

## 2020-05-18 NOTE — Progress Notes (Signed)
Pt to have lab work and injection on 05/25/20 at [facility]. Appointments here canceled for 05/25/20. Remaining series to be done here at [facility].

## 2020-05-25 ENCOUNTER — Other Ambulatory Visit: Payer: Medicare Other

## 2020-05-25 ENCOUNTER — Other Ambulatory Visit: Payer: Self-pay

## 2020-05-25 ENCOUNTER — Inpatient Hospital Stay: Payer: Medicare Other | Attending: Oncology

## 2020-05-25 ENCOUNTER — Inpatient Hospital Stay: Payer: Medicare Other

## 2020-05-25 ENCOUNTER — Ambulatory Visit: Payer: Medicare Other

## 2020-05-25 ENCOUNTER — Other Ambulatory Visit: Payer: Self-pay | Admitting: Oncology

## 2020-05-25 DIAGNOSIS — D469 Myelodysplastic syndrome, unspecified: Secondary | ICD-10-CM | POA: Insufficient documentation

## 2020-05-25 DIAGNOSIS — D75839 Thrombocytosis, unspecified: Secondary | ICD-10-CM

## 2020-05-25 DIAGNOSIS — D72828 Other elevated white blood cell count: Secondary | ICD-10-CM

## 2020-05-25 LAB — CBC WITH DIFFERENTIAL/PLATELET
Abs Immature Granulocytes: 0.99 10*3/uL — ABNORMAL HIGH (ref 0.00–0.07)
Basophils Absolute: 0 10*3/uL (ref 0.0–0.1)
Basophils Relative: 0 %
Eosinophils Absolute: 0.1 10*3/uL (ref 0.0–0.5)
Eosinophils Relative: 0 %
HCT: 29.4 % — ABNORMAL LOW (ref 36.0–46.0)
Hemoglobin: 10.2 g/dL — ABNORMAL LOW (ref 12.0–15.0)
Immature Granulocytes: 7 %
Lymphocytes Relative: 17 %
Lymphs Abs: 2.5 10*3/uL (ref 0.7–4.0)
MCH: 37.2 pg — ABNORMAL HIGH (ref 26.0–34.0)
MCHC: 34.7 g/dL (ref 30.0–36.0)
MCV: 107.3 fL — ABNORMAL HIGH (ref 80.0–100.0)
Monocytes Absolute: 0.1 10*3/uL (ref 0.1–1.0)
Monocytes Relative: 1 %
Neutro Abs: 11.5 10*3/uL — ABNORMAL HIGH (ref 1.7–7.7)
Neutrophils Relative %: 75 %
Platelets: 228 10*3/uL (ref 150–400)
RBC: 2.74 MIL/uL — ABNORMAL LOW (ref 3.87–5.11)
RDW: 19.9 % — ABNORMAL HIGH (ref 11.5–15.5)
WBC: 15.2 10*3/uL — ABNORMAL HIGH (ref 4.0–10.5)
nRBC: 0 % (ref 0.0–0.2)

## 2020-05-25 NOTE — Progress Notes (Signed)
No aranesp injection today. Parameters not met. Hgb 10.2

## 2020-06-08 ENCOUNTER — Ambulatory Visit: Payer: Medicare Other | Admitting: Obstetrics and Gynecology

## 2020-06-08 ENCOUNTER — Inpatient Hospital Stay: Payer: Medicare Other

## 2020-06-08 ENCOUNTER — Other Ambulatory Visit: Payer: Self-pay

## 2020-06-08 VITALS — BP 127/57 | HR 75 | Temp 98.3°F

## 2020-06-08 DIAGNOSIS — D72828 Other elevated white blood cell count: Secondary | ICD-10-CM

## 2020-06-08 DIAGNOSIS — D75839 Thrombocytosis, unspecified: Secondary | ICD-10-CM

## 2020-06-08 DIAGNOSIS — D72829 Elevated white blood cell count, unspecified: Secondary | ICD-10-CM

## 2020-06-08 DIAGNOSIS — D0511 Intraductal carcinoma in situ of right breast: Secondary | ICD-10-CM

## 2020-06-08 DIAGNOSIS — D469 Myelodysplastic syndrome, unspecified: Secondary | ICD-10-CM | POA: Diagnosis not present

## 2020-06-08 LAB — CBC WITH DIFFERENTIAL/PLATELET
Abs Immature Granulocytes: 0.23 10*3/uL — ABNORMAL HIGH (ref 0.00–0.07)
Basophils Absolute: 0 10*3/uL (ref 0.0–0.1)
Basophils Relative: 0 %
Eosinophils Absolute: 0.1 10*3/uL (ref 0.0–0.5)
Eosinophils Relative: 0 %
HCT: 27.4 % — ABNORMAL LOW (ref 36.0–46.0)
Hemoglobin: 9.4 g/dL — ABNORMAL LOW (ref 12.0–15.0)
Immature Granulocytes: 2 %
Lymphocytes Relative: 14 %
Lymphs Abs: 1.7 10*3/uL (ref 0.7–4.0)
MCH: 37.6 pg — ABNORMAL HIGH (ref 26.0–34.0)
MCHC: 34.3 g/dL (ref 30.0–36.0)
MCV: 109.6 fL — ABNORMAL HIGH (ref 80.0–100.0)
Monocytes Absolute: 0.1 10*3/uL (ref 0.1–1.0)
Monocytes Relative: 1 %
Neutro Abs: 9.6 10*3/uL — ABNORMAL HIGH (ref 1.7–7.7)
Neutrophils Relative %: 83 %
Platelets: 242 10*3/uL (ref 150–400)
RBC: 2.5 MIL/uL — ABNORMAL LOW (ref 3.87–5.11)
RDW: 18.9 % — ABNORMAL HIGH (ref 11.5–15.5)
WBC: 11.7 10*3/uL — ABNORMAL HIGH (ref 4.0–10.5)
nRBC: 0 % (ref 0.0–0.2)

## 2020-06-08 MED ORDER — DARBEPOETIN ALFA 300 MCG/0.6ML IJ SOSY
300.0000 ug | PREFILLED_SYRINGE | Freq: Once | INTRAMUSCULAR | Status: AC
  Administered 2020-06-08: 300 ug via SUBCUTANEOUS

## 2020-06-08 MED ORDER — DARBEPOETIN ALFA 300 MCG/0.6ML IJ SOSY
PREFILLED_SYRINGE | INTRAMUSCULAR | Status: AC
  Filled 2020-06-08: qty 0.6

## 2020-06-09 ENCOUNTER — Telehealth: Payer: Self-pay

## 2020-06-09 NOTE — Telephone Encounter (Signed)
Contacted pt regarding MD appointment schedule change. Changed 06/30/20 appointment to 08/09/20 at 10:30 am

## 2020-06-14 ENCOUNTER — Telehealth: Payer: Self-pay | Admitting: Oncology

## 2020-06-14 NOTE — Telephone Encounter (Signed)
R/s appts per 4/27 sch msg. Pt aware.

## 2020-06-22 ENCOUNTER — Ambulatory Visit: Payer: Medicare Other

## 2020-06-22 ENCOUNTER — Other Ambulatory Visit: Payer: Medicare Other

## 2020-06-28 ENCOUNTER — Inpatient Hospital Stay: Payer: Medicare Other

## 2020-06-30 ENCOUNTER — Ambulatory Visit: Payer: Medicare Other | Admitting: Oncology

## 2020-07-06 ENCOUNTER — Inpatient Hospital Stay: Payer: Medicare Other | Attending: Oncology

## 2020-07-06 ENCOUNTER — Inpatient Hospital Stay: Payer: Medicare Other

## 2020-07-13 DIAGNOSIS — E1169 Type 2 diabetes mellitus with other specified complication: Secondary | ICD-10-CM | POA: Diagnosis not present

## 2020-07-13 DIAGNOSIS — Z853 Personal history of malignant neoplasm of breast: Secondary | ICD-10-CM | POA: Diagnosis not present

## 2020-07-13 DIAGNOSIS — E7800 Pure hypercholesterolemia, unspecified: Secondary | ICD-10-CM | POA: Diagnosis not present

## 2020-07-13 DIAGNOSIS — I10 Essential (primary) hypertension: Secondary | ICD-10-CM | POA: Diagnosis not present

## 2020-07-19 DIAGNOSIS — Z9013 Acquired absence of bilateral breasts and nipples: Secondary | ICD-10-CM | POA: Diagnosis not present

## 2020-07-19 DIAGNOSIS — Z8679 Personal history of other diseases of the circulatory system: Secondary | ICD-10-CM | POA: Diagnosis not present

## 2020-07-19 DIAGNOSIS — E119 Type 2 diabetes mellitus without complications: Secondary | ICD-10-CM | POA: Diagnosis not present

## 2020-07-19 DIAGNOSIS — Z85820 Personal history of malignant melanoma of skin: Secondary | ICD-10-CM | POA: Diagnosis not present

## 2020-07-19 DIAGNOSIS — Z9221 Personal history of antineoplastic chemotherapy: Secondary | ICD-10-CM | POA: Diagnosis not present

## 2020-07-19 DIAGNOSIS — D471 Chronic myeloproliferative disease: Secondary | ICD-10-CM | POA: Diagnosis not present

## 2020-07-19 DIAGNOSIS — I10 Essential (primary) hypertension: Secondary | ICD-10-CM | POA: Diagnosis not present

## 2020-07-19 DIAGNOSIS — Z853 Personal history of malignant neoplasm of breast: Secondary | ICD-10-CM | POA: Diagnosis not present

## 2020-07-19 DIAGNOSIS — E785 Hyperlipidemia, unspecified: Secondary | ICD-10-CM | POA: Diagnosis not present

## 2020-07-20 ENCOUNTER — Other Ambulatory Visit: Payer: Medicare Other

## 2020-07-20 ENCOUNTER — Ambulatory Visit: Payer: Medicare Other

## 2020-07-21 ENCOUNTER — Ambulatory Visit: Payer: Medicare Other | Admitting: Nurse Practitioner

## 2020-07-28 ENCOUNTER — Other Ambulatory Visit: Payer: Self-pay | Admitting: Oncology

## 2020-07-28 NOTE — Progress Notes (Signed)
Per Dr. Rudene Christians AVE's note, we are changing the RNS to every 4 weeks after the August 03, 2020 dose.

## 2020-08-03 ENCOUNTER — Inpatient Hospital Stay: Payer: Medicare Other | Attending: Oncology

## 2020-08-03 ENCOUNTER — Inpatient Hospital Stay: Payer: Medicare Other

## 2020-08-03 DIAGNOSIS — Z9221 Personal history of antineoplastic chemotherapy: Secondary | ICD-10-CM | POA: Insufficient documentation

## 2020-08-03 DIAGNOSIS — Z8616 Personal history of COVID-19: Secondary | ICD-10-CM | POA: Insufficient documentation

## 2020-08-03 DIAGNOSIS — Z853 Personal history of malignant neoplasm of breast: Secondary | ICD-10-CM | POA: Insufficient documentation

## 2020-08-03 DIAGNOSIS — Z923 Personal history of irradiation: Secondary | ICD-10-CM | POA: Insufficient documentation

## 2020-08-03 DIAGNOSIS — D75839 Thrombocytosis, unspecified: Secondary | ICD-10-CM | POA: Insufficient documentation

## 2020-08-03 DIAGNOSIS — Z9012 Acquired absence of left breast and nipple: Secondary | ICD-10-CM | POA: Insufficient documentation

## 2020-08-03 DIAGNOSIS — Z87891 Personal history of nicotine dependence: Secondary | ICD-10-CM | POA: Insufficient documentation

## 2020-08-03 DIAGNOSIS — Z85820 Personal history of malignant melanoma of skin: Secondary | ICD-10-CM | POA: Insufficient documentation

## 2020-08-03 DIAGNOSIS — Z86006 Personal history of melanoma in-situ: Secondary | ICD-10-CM | POA: Insufficient documentation

## 2020-08-08 NOTE — Progress Notes (Signed)
ID: Keith Rake Hitch OB: November 09, 1945  MR#: [id number]  IRW#:[id number]  Patient Care Team: Leighton Ruff, MD as PCP - General (Family Medicine) Burtis Junes, NP (Inactive) as Nurse Practitioner (Nurse Practitioner) Jeovani Weisenburger, Virgie Dad, MD as Consulting Physician (Oncology) Bobbye Charleston, MD as Consulting Physician (Obstetrics and Gynecology) Janyth Contes, MD as Referring Physician (Hematology and Oncology) OTHER MD:   CHIEF COMPLAINTS:   1)  Hx Left Breast Cancer 1995 (s/p left mastectomy)       2) Hx Right Breast Cancer 2007                   3)  Hx Melanoma, Stage IIIA       4) MPN with leukocytosis/thrombocytosis      CURRENT TREATMENT: hydrea; aranesp   INTERVAL HISTORY: Margherita returns today for follow-up of her leukocytosis.  She also has a remote history of breast cancer. She continues under observation for that with mammography (and a bone density scan) scheduled for August..  Since her last visit, she underwent bone marrow biopsy at [facility] on 03/30/2020. Pathology from the procedure ([id number]) showed: hypercellular marrow (95%) with granulocytic predominance, left shift, and no increase in blasts.  She was started on Hydrea 500 mg daily with Aranesp 300 mcg subcu every 14 days support.  She is tolerating these well, with no side effects that she is aware of.  Her last Aranesp dose was actually 06/08/2020 because her hemoglobin generally reads just above 10  REVIEW OF SYSTEMS: Jeanny and her husband went on a wonderful trip across [place] and then landed in [place] where they were to have boarded a ship for [place].  At the very last day they were found to be COVID-positive and had to be quarantined.  They missed the [place] trip.  She had some fatigue and some cough but no other symptoms related to the COVID and she has completely recovered.  She is exercising regularly chiefly by walking.  She has lost a little weight she thinks because she is eating a little less.  She  has not changed her diet.  Detailed review of systems today was otherwise stable   COVID 19 VACCINATION STATUS: [place] x2, most recently 04/2019; had COVID May 2022   HISTORY OF PRESENT ILLNESS: From the earlier summary:  Areanna's history of breast cancer dates back to June of 1995, when she had left modified radical mastectomy under [facility] for a pT1c pN1, stage IIA invasive ductal carcinoma, grade 2, estrogen receptor 97% positive, progesterone receptor 45% positive, treated according to CALGB 9394, sequential arm (doxorubicin x3, fourth dose apparently omitted, followed by high-dose cyclophosphamide x3), followed by tamoxifen for 5 years. There has been no evidence of disease recurrence.  Further, in October of 2007 she underwent right lumpectomy for a ductal carcinoma in situ, high-grade, estrogen and progesterone receptor negative, followed by adjuvant radiation.  In July of 2013 the patient was noted to have an irregular mole in her left upper back. It is not clear to me whether this might be related to her prior left breast irradiation. Shave biopsy of this area 09/15/2011 by Dr. Derrel Nip [phone]) showed a superficial spreading malignant melanoma, with Breslow depth 1.45 mm, Clark's level IV. There was brisk host response and 8 mitoses per high power field were noted. There was focal regression but no definitive ulceration. There was no satellitosis. There was no vascular and urgent. Margins were positive, but cleared by wide excision under Dr Stark Klein 10/10/2011. There was  some residual malignant melanoma in situ but margins to 2 cm were obtained, with advancement flap closure of the skin defect. In addition left axillary sentinel lymph node mapping was performed showing metastatic melanoma in the single lymph node removed.  Iliyana sought a second opinion at [facility] under Dr Joaquim Lai Collichio. There was a full discussion regarding completion nodal resection, use of adjuvant interferon,  and BRAF testing with a view to participation in a research protocol then available. The patient considered all these options and after much discussion the decision was made not to pursue full nodal dissection, partly because the area in question had a ready undergone surgery and radiation. The patient opted against interferon adjuvant therapy because of its very marginal benefit and significant side effects. She declined consideration of a research study and BRAF testing was not performed.  Her subsequent history is as detailed below.   PAST MEDICAL HISTORY: Past Medical History:  Diagnosis Date   Breast cancer ([place]) 1995 and 2007   Cardiomyopathy secondary to chemotherapy [facility])    has improved    Hyperlipidemia    Hypertension    under control, has been on med. x 3 yrs.   Melanoma in situ of back [facility]) 09/2011   left   Personal history of chemotherapy    Personal history of radiation therapy    Skin cancer 2013   Melanoma    PAST SURGICAL HISTORY: Past Surgical History:  Procedure Laterality Date   BREAST LUMPECTOMY  12/21/2005   right   MASTECTOMY  1995   with breast reconstruction - left    FAMILY HISTORY Family History  Problem Relation Age of Onset   Lung cancer Father    Cancer Father        lung   Autism Son    Breast cancer Daughter    the patient's father died from complications of lung cancer at the age of 2. He was a heavy smoker. The patient's mother died at the age of 72. Alyssah had no siblings. She underwent genetic testing for breast and ovarian cancer panel April of 2014. There were no demonstrable mutations in the BRCA or the other genes in the panel   GYNECOLOGIC HISTORY:  Menarche age 6, first live birth age 1. She is GX P2. She entered menopause at age 44, when she received her chemotherapy. She did not use hormone replacement. She did use birth control remotely, for approximately 14 years, with no complications.   SOCIAL HISTORY:   (Updated  06/03/2013) [name] used to work at [employer], and she is still "fills in" there part-time.  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  They recently me moved to a new home in the [facility] area.  Zana's son [name] is handicapped, and works as an Training and development officer. [name] is Brewing technologist limited. Madine has 3 grandchildren and Friendship 2. Hermelinda grew up in an Database administrator denomination   ADVANCED DIRECTIVES: In place   HEALTH MAINTENANCE:  Social History   Tobacco Use   Smoking status: Former    Pack years: 0.00    Types: Cigarettes    Quit date: 02/14/1964    Years since quitting: 56.5   Smokeless tobacco: Never  Vaping Use   Vaping Use: Never used  Substance Use Topics   Alcohol use: Yes    Comment: daily glass of wine   Drug  use: No     Colonoscopy: 2011  PAP: November 2014  Bone density: January 2013, osteopenia  Lipid panel:   August 2013/Dr. Drema Dallas   No Known Allergies  Current Outpatient Medications  Medication Sig Dispense Refill   hydroxyurea (HYDREA) 500 MG capsule Take 1 capsule (500 mg total) by mouth daily. May take with food to minimize GI side effects.     atorvastatin (LIPITOR) 80 MG tablet TAKE 1 TABLET BY MOUTH DAILY 90 tablet 2   carvedilol (COREG) 25 MG tablet TAKE ONE TABLET BY MOUTH TWO TIMES DAILY. GENERIC EQUIVALENT FOR COREG 180 tablet 3   cholecalciferol (VITAMIN D) 1000 UNITS tablet Take 2,000 Units by mouth daily.      hydrochlorothiazide (HYDRODIURIL) 25 MG tablet TAKE 1 TABLET BY MOUTH DAILY. 90 tablet 3   metFORMIN (GLUCOPHAGE) 500 MG tablet 250 mg twice a day.     ramipril (ALTACE) 10 MG capsule TAKE 1 CAPSULE BY MOUTH DAILY 90 capsule 2   No current facility-administered medications for this visit.    OBJECTIVE: White woman who appears younger than stated age  75:   08/09/20 1036  BP:  133/61  Pulse: 78  Resp: 18  Temp: 97.7 F (36.5 C)  SpO2: 97%     Body mass index is 22.14 kg/m.    ECOG FS:1 - Symptomatic but completely ambulatory Filed Weights   08/09/20 1036  Weight: 117 lb 3.2 oz (53.2 kg)    Sclerae unicteric, EOMs intact Wearing a mask No cervical or supraclavicular adenopathy, no axillary adenopathy Lungs no rales or rhonchi Heart regular rate and rhythm Abd soft, nontender, no splenomegaly MSK no focal spinal tenderness, no upper extremity lymphedema Neuro: nonfocal, well oriented, appropriate affect Breasts: Deferred   LAB RESULTS:   Lab Results  Component Value Date   WBC 11.7 (H) 06/08/2020   NEUTROABS 9.6 (H) 06/08/2020   HGB 9.4 (L) 06/08/2020   HCT 27.4 (L) 06/08/2020   MCV 109.6 (H) 06/08/2020   PLT 242 06/08/2020      Chemistry      Component Value Date/Time   NA 136 01/28/2020 1315   NA 139 11/30/2015 1225   K 4.7 01/28/2020 1315   K 4.6 11/30/2015 1225   CL 95 (L) 01/28/2020 1315   CL 104 01/18/2012 0948   CO2 27 01/28/2020 1315   CO2 27 11/30/2015 1225   BUN 11 01/28/2020 1315   BUN 13.1 11/30/2015 1225   CREATININE 0.58 01/28/2020 1315   CREATININE 0.7 11/30/2015 1225      Component Value Date/Time   CALCIUM 9.6 01/28/2020 1315   CALCIUM 10.0 11/30/2015 1225   ALKPHOS 57 01/28/2020 1315   ALKPHOS 83 11/30/2015 1225   AST 26 01/28/2020 1315   AST 25 11/30/2015 1225   ALT 23 01/28/2020 1315   ALT 27 11/30/2015 1225   BILITOT 0.4 01/28/2020 1315   BILITOT 0.60 11/30/2015 1225      STUDIES: No results found.   ASSESSMENT: 75 y.o. BRCA negative [place] woman   (1) status post left mastectomy with TRAM reconstruction June of 1995 for a T1c N1, stage IIA invasive ductal carcinoma, grade 2, estrogen receptor 97% positive, progesterone receptor 45% positive, treated adjuvantly according to CALGB 9394 with 3 cycles of doxorubicin followed by 3 cycles of cyclophosphamide, then tamoxifen for 5 years, off therapy  completed November of 2000  (2) status post right lumpectomy October 2007 for high-grade ductal carcinoma in situ, with negative margins estrogen and progesterone receptor  negative, followed by adjuvant radiation therapy   (3) malignant melanoma, T2a N1a = stage IIIA, as follows:  (a) status post shave biopsy from the right upper back 09/15/2011 for a superficial spreading melanoma, Breslow depth 1.45 mm, Clark's level IV, with focal regression but no ulceration.  (b) status post wide excision and sentinel lymph node sampling 10/10/2011 with residual malignant melanoma in situ, but negative margins; the single sentinel lymph node was involved by melanoma with the largest subcapsular deposit measuring 0.22 mm, no evidence of capsular involvement or extracapsular extension  (c) completion nodal dissection was discussed, but not performed in part because the patient had already had a complete left axillary lymph node dissection at the time of her 1995 left mastectomy (10 lymph nodes were removed at that time)  (d) adjuvant interferon was discussed with the patient when she visited [facility] in September 2013, but given that it side effects and marginal benefits the patient declined  (e) BRAF testing has not been done on the original tumor; this was also discussed with the patient at the time of her [facility] visit, as it might possibly lead to participation in a research protocol; the patient decided not to pursue that option  (4) PET scan 11/19/18/2015 shows nonspecific uptake only at T6.   (a) MRI of the thoracic spine November 2015 showed no evidence of metastatic disease  (b) repeat PET scan 12/02/2014 shows no residual or recurrent hypermetabolic tumor.  (5) thrombocytosis first noted October 2017, leukocytosis first noted November 2019  (a) bone marrow biopsy 04/03/2018 showed a hypercellular bone marrow with granulocytic and megakaryocytic proliferation, some of the megakaryocytes being small and/or  hypolobulated.  There was no increase in blasts, no significant increase in reticulin fibers  (b) cytogenetics from bone marrow biopsy 04/03/2018 showed 46,XX[20].nuc ish (ABL1, BCR)x2  (c) molecular studies 12/18/2017 showed no mutations in JAK2 exons 12 and 14 (including V 617);  MPL exon 10 or CALR exon 9  (d) BCR/ABL 1 drawn 12/18/2017 showed 94% normal nuclei (6% with single fusion)  (e) repeat BCR/ABL 1 on 04/30/2018 showed 100% normal nuclei  (f) repeat BCR/ABL 1 on 02/27/2019: Translocation not detected  (6) bone marrow biopsy on 10/31/2019 shows a hypercellular marrow with myeloid and megakaryocytic hyperplasia but no increase in blasts.  (a) cytogenetics showed no metaphase cells for analysis  (b) Next generation sequencing for myeloid disorders reveals mutations of  ASXL1, GATA2, and U2AF1. These findings support the impression of a  myeloid neoplasm.    (c) hydroxyurea 500 mg daily started March 2022 with Aranesp support   PLAN: Finola is tolerating her Hydrea remarkably well and we have not had to increase her dose.  She has not received the Aranesp now for some weeks.  Generally it is held in these cases for hemoglobin of 10 or greater.  Her hemoglobin as noted above last week was 10.1.  I think we could broaden the treatment interval so she will have the same dose every 3 weeks and we will start that on September 07, 2020 (she will be in [place] mid July).  She is already scheduled for mammography and a DEXA scan at the end of August.  She will see Dr. Mortimer Fries early September and see me late September.  At this point I am very pleased with how well she is doing with her treatment and how stable things appear.  She has lost a little bit of weight and we discussed how to maintain her weight today  Total encounter time 25 minutes.Chauncey Cruel, MD   08/09/2020 11:03 AM  Oncology and Hematology [facility] [place], [place] [address] Tel.  [phone]  Joylene Igo [phone]   I, Wilburn Mylar, am acting as scribe for Dr. Virgie Dad. Rai Sinagra.  I, Lurline Del MD, have reviewed the above documentation for accuracy and completeness, and I agree with the above.   *Total Encounter Time as defined by the Centers for Medicare and Medicaid Services includes, in addition to the face-to-face time of a patient visit (documented in the note above) non-face-to-face time: obtaining and reviewing outside history, ordering and reviewing medications, tests or procedures, care coordination (communications with other health care professionals or caregivers) and documentation in the medical record.

## 2020-08-09 ENCOUNTER — Other Ambulatory Visit: Payer: Self-pay

## 2020-08-09 ENCOUNTER — Inpatient Hospital Stay (HOSPITAL_BASED_OUTPATIENT_CLINIC_OR_DEPARTMENT_OTHER): Payer: Medicare Other | Admitting: Oncology

## 2020-08-09 VITALS — BP 133/61 | HR 78 | Temp 97.7°F | Resp 18 | Ht 61.0 in | Wt 117.2 lb

## 2020-08-09 DIAGNOSIS — D72829 Elevated white blood cell count, unspecified: Secondary | ICD-10-CM

## 2020-08-09 DIAGNOSIS — I428 Other cardiomyopathies: Secondary | ICD-10-CM | POA: Diagnosis not present

## 2020-08-09 DIAGNOSIS — Z853 Personal history of malignant neoplasm of breast: Secondary | ICD-10-CM | POA: Diagnosis not present

## 2020-08-09 DIAGNOSIS — Z9012 Acquired absence of left breast and nipple: Secondary | ICD-10-CM | POA: Diagnosis not present

## 2020-08-09 DIAGNOSIS — Z85820 Personal history of malignant melanoma of skin: Secondary | ICD-10-CM | POA: Diagnosis not present

## 2020-08-09 DIAGNOSIS — Z8616 Personal history of COVID-19: Secondary | ICD-10-CM | POA: Diagnosis not present

## 2020-08-09 DIAGNOSIS — Z86006 Personal history of melanoma in-situ: Secondary | ICD-10-CM | POA: Diagnosis not present

## 2020-08-09 DIAGNOSIS — D75839 Thrombocytosis, unspecified: Secondary | ICD-10-CM

## 2020-08-09 DIAGNOSIS — Z9221 Personal history of antineoplastic chemotherapy: Secondary | ICD-10-CM | POA: Diagnosis not present

## 2020-08-09 DIAGNOSIS — Z923 Personal history of irradiation: Secondary | ICD-10-CM | POA: Diagnosis not present

## 2020-08-09 DIAGNOSIS — Z87891 Personal history of nicotine dependence: Secondary | ICD-10-CM | POA: Diagnosis not present

## 2020-08-13 ENCOUNTER — Telehealth: Payer: Self-pay | Admitting: Oncology

## 2020-08-13 NOTE — Telephone Encounter (Signed)
Sch per 06/27 los, pt aware 

## 2020-08-17 ENCOUNTER — Other Ambulatory Visit: Payer: Medicare Other

## 2020-08-17 ENCOUNTER — Ambulatory Visit: Payer: Medicare Other

## 2020-08-20 DIAGNOSIS — Z20822 Contact with and (suspected) exposure to covid-19: Secondary | ICD-10-CM | POA: Diagnosis not present

## 2020-08-29 NOTE — Progress Notes (Signed)
Cardiology Office Note:    Date:  08/31/2020   ID:  Keith Rake Rostad, DOB 01-Apr-1945, MRN [id number]  PCP:  Leighton Ruff, MD   [facility] Providers Cardiologist:  None {  Referring MD: Leighton Ruff, MD    History of Present Illness:    Tanya Mitchell is a 75 y.o. female with a hx of prior cardiomyopathy secondary to chemo from breast cancer with recovered EF, melenoma, HLD and DMII who was previously followed by Truitt Merle, NP who now presents to clinic for follow-up.  Last seen in clinic 01/28/20 where she was doing well.   Today, the patient overall feels well. No chest pain, SOB, LE edema, lightheadedness, or dizziness. Has chronic elevated WBC and has undergone bone biopsy with no evidence of malignancy. Has been maintained on hydroxyurea. This has controlled the WBC without issues. She is overall active and no symptoms with activity.   Past Medical History:  Diagnosis Date   Breast cancer ([place]) 1995 and 2007   Cardiomyopathy secondary to chemotherapy [facility])    has improved    Hyperlipidemia    Hypertension    under control, has been on med. x 3 yrs.   Melanoma in situ of back [facility]) 09/2011   left   Personal history of chemotherapy    Personal history of radiation therapy    Skin cancer 2013   Melanoma    Past Surgical History:  Procedure Laterality Date   BREAST LUMPECTOMY  12/21/2005   right   MASTECTOMY  1995   with breast reconstruction - left    Current Medications: Current Meds  Medication Sig   atorvastatin (LIPITOR) 80 MG tablet TAKE 1 TABLET BY MOUTH DAILY   carvedilol (COREG) 25 MG tablet TAKE ONE TABLET BY MOUTH TWO TIMES DAILY. GENERIC EQUIVALENT FOR COREG   cholecalciferol (VITAMIN D) 1000 UNITS tablet Take 2,000 Units by mouth daily.    glucosamine-chondroitin 500-400 MG tablet Take 1 tablet by mouth daily.   hydrochlorothiazide (HYDRODIURIL) 25 MG tablet TAKE 1 TABLET BY MOUTH DAILY.   hydroxyurea (HYDREA) 500 MG capsule  Take 1 capsule (500 mg total) by mouth daily. May take with food to minimize GI side effects.   metFORMIN (GLUCOPHAGE) 500 MG tablet 250 mg twice a day.   ramipril (ALTACE) 10 MG capsule TAKE 1 CAPSULE BY MOUTH DAILY     Allergies:   Patient has no known allergies.   Social History   Socioeconomic History   Marital status: Married    Spouse name: Not on file   Number of children: Not on file   Years of education: Not on file   Highest education level: Not on file  Occupational History   Not on file  Tobacco Use   Smoking status: Former    Types: Cigarettes    Quit date: 02/14/1964    Years since quitting: 56.5   Smokeless tobacco: Never  Vaping Use   Vaping Use: Never used  Substance and Sexual Activity   Alcohol use: Yes    Comment: daily glass of wine   Drug use: No   Sexual activity: Yes    Birth control/protection: Post-menopausal  Other Topics Concern   Not on file  Social History Narrative   Not on file   Social Determinants of Health   Financial Resource Strain: Not on file  Food Insecurity: Not on file  Transportation Needs: Not on file  Physical Activity: Not on file  Stress: Not on file  Social Connections:  Not on file     Family History: The patient's family history includes Autism in her son; Breast cancer in her daughter; Cancer in her father; Lung cancer in her father.  ROS:   Please see the history of present illness.    Review of Systems  Constitutional:  Negative for chills and fever.  HENT:  Negative for hearing loss.   Eyes:  Negative for blurred vision.  Respiratory:  Negative for shortness of breath.   Cardiovascular:  Negative for chest pain, palpitations, orthopnea, claudication, leg swelling and PND.  Gastrointestinal:  Negative for melena, nausea and vomiting.  Genitourinary:  Negative for hematuria.  Musculoskeletal:  Negative for falls.  Neurological:  Negative for dizziness and loss of consciousness.  Psychiatric/Behavioral:   Negative for substance abuse.     EKGs/Labs/Other Studies Reviewed:    The following studies were reviewed today: TTE 11/22/18: IMPRESSIONS   1. Left ventricular ejection fraction, by visual estimation, is 45 to  50%. The left ventricle has mildly decreased function. Normal left  ventricular size. There is no left ventricular hypertrophy.   2. Elevated left ventricular end-diastolic pressure.   3. Left ventricular diastolic Doppler parameters are consistent with  impaired relaxation pattern of LV diastolic filling.   4. Global longitudinal strain abnormal (-11.5%). Very slightly worse than  10/2017.   5. Global right ventricle has normal systolic function.The right  ventricular size is normal. No increase in right ventricular wall  thickness.   6. Left atrial size was moderately dilated.   7. Right atrial size was normal.   8. The mitral valve is normal in structure. Trace mitral valve  regurgitation. No evidence of mitral stenosis.   9. The tricuspid valve is normal in structure. Tricuspid valve  regurgitation is mild.  10. The aortic valve is normal in structure. Aortic valve regurgitation  was not visualized by color flow Doppler. Structurally normal aortic  valve, with no evidence of sclerosis or stenosis.  11. The pulmonic valve was normal in structure. Pulmonic valve  regurgitation is trivial by color flow Doppler.  12. Mildly elevated pulmonary artery systolic pressure.  13. The inferior vena cava is normal in size with greater than 50%  respiratory variability, suggesting right atrial pressure of 3 mmHg.     EKG:  EKG not performed  Recent Labs: 01/28/2020: ALT 23; BUN 11; Creatinine, Ser 0.58; Potassium 4.7; Sodium 136 06/08/2020: Hemoglobin 9.4; Platelets 242  Recent Lipid Panel    Component Value Date/Time   CHOL 126 01/28/2020 1315   TRIG 190 (H) 01/28/2020 1315   HDL 29 (L) 01/28/2020 1315   CHOLHDL 4.3 01/28/2020 1315   LDLCALC 65 01/28/2020 1315     Risk  Assessment/Calculations:           Physical Exam:    VS:  BP 116/72   Pulse 77   Ht 5\' 1"  (1.549 m)   Wt 120 lb 6.4 oz (54.6 kg)   SpO2 99%   BMI 22.75 kg/m     Wt Readings from Last 3 Encounters:  08/31/20 120 lb 6.4 oz (54.6 kg)  08/09/20 117 lb 3.2 oz (53.2 kg)  03/10/20 123 lb (55.8 kg)     GEN:  Well nourished, well developed in no acute distress HEENT: Normal NECK: No JVD; No carotid bruits CARDIAC: RRR, 2/6 systolic murmur. No rubs, gallops RESPIRATORY:  Clear to auscultation without rales, wheezing or rhonchi  ABDOMEN: Soft, non-tender, non-distended MUSCULOSKELETAL:  No edema; No deformity  SKIN: Warm and  dry NEUROLOGIC:  Alert and oriented x 3 PSYCHIATRIC:  Normal affect   ASSESSMENT:    1. Nonischemic cardiomyopathy ([place])   2. Benign essential HTN   3. Essential hypertension   4. NICM (nonischemic cardiomyopathy) ([place])   5. Mixed hyperlipidemia   6. Chronic systolic heart failure ([place])   7. Malignant neoplasm of female breast, unspecified estrogen receptor status, unspecified laterality, unspecified site of breast ([place])    PLAN:    In order of problems listed above:  #Nonoschemic CM: #History of chemotherapy induced CM: History of NICM with reduced LVEF secondary to chemo with recovered EF. Repeat TTE in 2020 with slight drop in LVEF 45% (unclear etiology). Currently asymptomatic with NYHA class I symptoms. -Repeat TTE to reassess -Cannot tolerate spiro due to hyperK -Continue coreg 25mg  BID -Continue ramipril 10mg  daily (?entresto) -Low Na diet  #HTN: Very well controlled and at goal <120s/80s. -Continue coreg 25mg  BID -Continue ramipril 10mg  daily  -Continue HCTZ 25mg  daily  #HLD: LDL 65 01/2020. -Continue lipitor 80mg  daily  #History of Breast Cancer in remission History of chemo with anthracycline and XRT. Had depressed LVEF but recovered. Now back with LVEF 45% in 2020. -Off chemo agents -Repeat TTE for monitoring     Medication  Adjustments/Labs and Tests Ordered: Current medicines are reviewed at length with the patient today.  Concerns regarding medicines are outlined above.  Orders Placed This Encounter  Procedures   ECHOCARDIOGRAM COMPLETE   No orders of the defined types were placed in this encounter.   Patient Instructions  Medication Instructions:   Your physician recommends that you continue on your current medications as directed. Please refer to the Current Medication list given to you today.  *If you need a refill on your cardiac medications before your next appointment, please call your pharmacy*   Testing/Procedures:  Your physician has requested that you have an echocardiogram. Echocardiography is a painless test that uses sound waves to create images of your heart. It provides your doctor with information about the size and shape of your heart and how well your heart's chambers and valves are working. This procedure takes approximately one hour. There are no restrictions for this procedure.   Follow-Up: At [facility], you and your health needs are our priority.  As part of our continuing mission to provide you with exceptional heart care, we have created designated Provider Care Teams.  These Care Teams include your primary Cardiologist (physician) and Advanced Practice Providers (APPs -  Physician Assistants and Nurse Practitioners) who all work together to provide you with the care you need, when you need it.  We recommend signing up for the patient portal called "MyChart".  Sign up information is provided on this After Visit Summary.  MyChart is used to connect with patients for Virtual Visits (Telemedicine).  Patients are able to view lab/test results, encounter notes, upcoming appointments, etc.  Non-urgent messages can be sent to your provider as well.   To learn more about what you can do with MyChart, go to [email or web address].    Your next appointment:   6 month(s)  The format  for your next appointment:   In Person  Provider:   Gwyndolyn Kaufman, MD     Signed, Freada Bergeron, MD  08/31/2020 2:15 PM    [place]

## 2020-08-31 ENCOUNTER — Encounter: Payer: Self-pay | Admitting: Cardiology

## 2020-08-31 ENCOUNTER — Ambulatory Visit: Payer: Medicare Other | Admitting: Cardiology

## 2020-08-31 ENCOUNTER — Other Ambulatory Visit: Payer: Self-pay

## 2020-08-31 VITALS — BP 116/72 | HR 77 | Ht 61.0 in | Wt 120.4 lb

## 2020-08-31 DIAGNOSIS — E782 Mixed hyperlipidemia: Secondary | ICD-10-CM | POA: Diagnosis not present

## 2020-08-31 DIAGNOSIS — I10 Essential (primary) hypertension: Secondary | ICD-10-CM

## 2020-08-31 DIAGNOSIS — C50919 Malignant neoplasm of unspecified site of unspecified female breast: Secondary | ICD-10-CM

## 2020-08-31 DIAGNOSIS — I5022 Chronic systolic (congestive) heart failure: Secondary | ICD-10-CM | POA: Diagnosis not present

## 2020-08-31 DIAGNOSIS — I428 Other cardiomyopathies: Secondary | ICD-10-CM

## 2020-08-31 NOTE — Patient Instructions (Signed)
Medication Instructions:   Your physician recommends that you continue on your current medications as directed. Please refer to the Current Medication list given to you today.  *If you need a refill on your cardiac medications before your next appointment, please call your pharmacy*   Testing/Procedures:  Your physician has requested that you have an echocardiogram. Echocardiography is a painless test that uses sound waves to create images of your heart. It provides your doctor with information about the size and shape of your heart and how well your heart's chambers and valves are working. This procedure takes approximately one hour. There are no restrictions for this procedure.   Follow-Up: At [facility], you and your health needs are our priority.  As part of our continuing mission to provide you with exceptional heart care, we have created designated Provider Care Teams.  These Care Teams include your primary Cardiologist (physician) and Advanced Practice Providers (APPs -  Physician Assistants and Nurse Practitioners) who all work together to provide you with the care you need, when you need it.  We recommend signing up for the patient portal called "MyChart".  Sign up information is provided on this After Visit Summary.  MyChart is used to connect with patients for Virtual Visits (Telemedicine).  Patients are able to view lab/test results, encounter notes, upcoming appointments, etc.  Non-urgent messages can be sent to your provider as well.   To learn more about what you can do with MyChart, go to [email or web address].    Your next appointment:   6 month(s)  The format for your next appointment:   In Person  Provider:   Heather Pemberton, MD    

## 2020-09-06 ENCOUNTER — Inpatient Hospital Stay: Payer: Medicare Other | Attending: Oncology

## 2020-09-06 ENCOUNTER — Other Ambulatory Visit: Payer: Self-pay

## 2020-09-06 ENCOUNTER — Inpatient Hospital Stay: Payer: Medicare Other

## 2020-09-06 VITALS — BP 114/80 | HR 70 | Temp 98.5°F | Resp 16

## 2020-09-06 DIAGNOSIS — D72828 Other elevated white blood cell count: Secondary | ICD-10-CM

## 2020-09-06 DIAGNOSIS — I428 Other cardiomyopathies: Secondary | ICD-10-CM

## 2020-09-06 DIAGNOSIS — D469 Myelodysplastic syndrome, unspecified: Secondary | ICD-10-CM | POA: Diagnosis not present

## 2020-09-06 DIAGNOSIS — D0511 Intraductal carcinoma in situ of right breast: Secondary | ICD-10-CM

## 2020-09-06 DIAGNOSIS — D72829 Elevated white blood cell count, unspecified: Secondary | ICD-10-CM

## 2020-09-06 DIAGNOSIS — D75839 Thrombocytosis, unspecified: Secondary | ICD-10-CM

## 2020-09-06 LAB — CBC WITH DIFFERENTIAL/PLATELET
Abs Immature Granulocytes: 0.2 10*3/uL — ABNORMAL HIGH (ref 0.00–0.07)
Band Neutrophils: 3 %
Basophils Absolute: 0 10*3/uL (ref 0.0–0.1)
Basophils Relative: 0 %
Eosinophils Absolute: 0 10*3/uL (ref 0.0–0.5)
Eosinophils Relative: 0 %
HCT: 25.2 % — ABNORMAL LOW (ref 36.0–46.0)
Hemoglobin: 8.6 g/dL — ABNORMAL LOW (ref 12.0–15.0)
Lymphocytes Relative: 12 %
Lymphs Abs: 2.9 10*3/uL (ref 0.7–4.0)
MCH: 39.8 pg — ABNORMAL HIGH (ref 26.0–34.0)
MCHC: 34.1 g/dL (ref 30.0–36.0)
MCV: 116.7 fL — ABNORMAL HIGH (ref 80.0–100.0)
Metamyelocytes Relative: 1 %
Monocytes Absolute: 0.2 10*3/uL (ref 0.1–1.0)
Monocytes Relative: 1 %
Neutro Abs: 20.7 10*3/uL — ABNORMAL HIGH (ref 1.7–7.7)
Neutrophils Relative %: 83 %
Platelets: 179 10*3/uL (ref 150–400)
RBC: 2.16 MIL/uL — ABNORMAL LOW (ref 3.87–5.11)
RDW: 15 % (ref 11.5–15.5)
WBC: 24.1 10*3/uL — ABNORMAL HIGH (ref 4.0–10.5)
nRBC: 0 % (ref 0.0–0.2)

## 2020-09-06 LAB — RETICULOCYTES
Immature Retic Fract: 33.6 % — ABNORMAL HIGH (ref 2.3–15.9)
RBC.: 2.13 MIL/uL — ABNORMAL LOW (ref 3.87–5.11)
Retic Count, Absolute: 38.1 10*3/uL (ref 19.0–186.0)
Retic Ct Pct: 1.8 % (ref 0.4–3.1)

## 2020-09-06 LAB — FERRITIN: Ferritin: 116 ng/mL (ref 11–307)

## 2020-09-06 LAB — IRON AND TIBC
Iron: 97 ug/dL (ref 41–142)
Saturation Ratios: 29 % (ref 21–57)
TIBC: 331 ug/dL (ref 236–444)
UIBC: 234 ug/dL (ref 120–384)

## 2020-09-06 MED ORDER — DARBEPOETIN ALFA 300 MCG/0.6ML IJ SOSY
PREFILLED_SYRINGE | INTRAMUSCULAR | Status: AC
  Filled 2020-09-06: qty 0.6

## 2020-09-06 MED ORDER — DARBEPOETIN ALFA 300 MCG/0.6ML IJ SOSY
300.0000 ug | PREFILLED_SYRINGE | Freq: Once | INTRAMUSCULAR | Status: AC
  Administered 2020-09-06: 300 ug via SUBCUTANEOUS

## 2020-09-15 DIAGNOSIS — E7800 Pure hypercholesterolemia, unspecified: Secondary | ICD-10-CM | POA: Diagnosis not present

## 2020-09-15 DIAGNOSIS — I10 Essential (primary) hypertension: Secondary | ICD-10-CM | POA: Diagnosis not present

## 2020-09-15 DIAGNOSIS — E1169 Type 2 diabetes mellitus with other specified complication: Secondary | ICD-10-CM | POA: Diagnosis not present

## 2020-09-15 DIAGNOSIS — D72829 Elevated white blood cell count, unspecified: Secondary | ICD-10-CM | POA: Diagnosis not present

## 2020-09-17 ENCOUNTER — Other Ambulatory Visit (HOSPITAL_COMMUNITY): Payer: Medicare Other

## 2020-09-20 ENCOUNTER — Other Ambulatory Visit: Payer: Self-pay

## 2020-09-20 MED ORDER — RAMIPRIL 10 MG PO CAPS: 10.0000 mg | ORAL_CAPSULE | Freq: Every day | ORAL | 2 refills | Status: DC

## 2020-09-20 MED ORDER — CARVEDILOL 25 MG PO TABS: ORAL_TABLET | ORAL | 2 refills | Status: DC

## 2020-09-20 MED ORDER — ATORVASTATIN CALCIUM 80 MG PO TABS: 80.0000 mg | ORAL_TABLET | Freq: Every day | ORAL | 2 refills | Status: DC

## 2020-09-23 ENCOUNTER — Other Ambulatory Visit: Payer: Self-pay

## 2020-09-23 MED ORDER — CARVEDILOL 25 MG PO TABS: ORAL_TABLET | ORAL | 3 refills | Status: DC

## 2020-09-23 MED ORDER — RAMIPRIL 10 MG PO CAPS: 10.0000 mg | ORAL_CAPSULE | Freq: Every day | ORAL | 3 refills | Status: DC

## 2020-09-23 MED ORDER — ATORVASTATIN CALCIUM 80 MG PO TABS: 80.0000 mg | ORAL_TABLET | Freq: Every day | ORAL | 2 refills | Status: DC

## 2020-09-27 ENCOUNTER — Inpatient Hospital Stay: Payer: Medicare Other | Attending: Oncology

## 2020-09-27 ENCOUNTER — Inpatient Hospital Stay: Payer: Medicare Other

## 2020-09-27 ENCOUNTER — Other Ambulatory Visit: Payer: Self-pay

## 2020-09-27 VITALS — BP 106/61 | HR 77 | Temp 98.3°F | Resp 16

## 2020-09-27 DIAGNOSIS — D72829 Elevated white blood cell count, unspecified: Secondary | ICD-10-CM

## 2020-09-27 DIAGNOSIS — D75839 Thrombocytosis, unspecified: Secondary | ICD-10-CM

## 2020-09-27 DIAGNOSIS — I428 Other cardiomyopathies: Secondary | ICD-10-CM

## 2020-09-27 DIAGNOSIS — D0511 Intraductal carcinoma in situ of right breast: Secondary | ICD-10-CM

## 2020-09-27 DIAGNOSIS — D469 Myelodysplastic syndrome, unspecified: Secondary | ICD-10-CM | POA: Diagnosis not present

## 2020-09-27 DIAGNOSIS — D72828 Other elevated white blood cell count: Secondary | ICD-10-CM

## 2020-09-27 LAB — CBC WITH DIFFERENTIAL/PLATELET
Abs Immature Granulocytes: 3.74 10*3/uL — ABNORMAL HIGH (ref 0.00–0.07)
Basophils Absolute: 0.1 10*3/uL (ref 0.0–0.1)
Basophils Relative: 1 %
Eosinophils Absolute: 0 10*3/uL (ref 0.0–0.5)
Eosinophils Relative: 0 %
HCT: 24.8 % — ABNORMAL LOW (ref 36.0–46.0)
Hemoglobin: 8.6 g/dL — ABNORMAL LOW (ref 12.0–15.0)
Immature Granulocytes: 13 %
Lymphocytes Relative: 10 %
Lymphs Abs: 2.9 10*3/uL (ref 0.7–4.0)
MCH: 40.2 pg — ABNORMAL HIGH (ref 26.0–34.0)
MCHC: 34.7 g/dL (ref 30.0–36.0)
MCV: 115.9 fL — ABNORMAL HIGH (ref 80.0–100.0)
Monocytes Absolute: 0.5 10*3/uL (ref 0.1–1.0)
Monocytes Relative: 2 %
Neutro Abs: 21.2 10*3/uL — ABNORMAL HIGH (ref 1.7–7.7)
Neutrophils Relative %: 74 %
Platelets: 179 10*3/uL (ref 150–400)
RBC: 2.14 MIL/uL — ABNORMAL LOW (ref 3.87–5.11)
RDW: 16.3 % — ABNORMAL HIGH (ref 11.5–15.5)
WBC: 28.5 10*3/uL — ABNORMAL HIGH (ref 4.0–10.5)
nRBC: 0 % (ref 0.0–0.2)

## 2020-09-27 LAB — IRON AND TIBC
Iron: 137 ug/dL (ref 41–142)
Saturation Ratios: 40 % (ref 21–57)
TIBC: 345 ug/dL (ref 236–444)
UIBC: 208 ug/dL (ref 120–384)

## 2020-09-27 LAB — RETICULOCYTES
Immature Retic Fract: 32.3 % — ABNORMAL HIGH (ref 2.3–15.9)
RBC.: 2.16 MIL/uL — ABNORMAL LOW (ref 3.87–5.11)
Retic Count, Absolute: 37.8 10*3/uL (ref 19.0–186.0)
Retic Ct Pct: 1.8 % (ref 0.4–3.1)

## 2020-09-27 LAB — FERRITIN: Ferritin: 142 ng/mL (ref 11–307)

## 2020-09-27 MED ORDER — DARBEPOETIN ALFA 300 MCG/0.6ML IJ SOSY
300.0000 ug | PREFILLED_SYRINGE | Freq: Once | INTRAMUSCULAR | Status: AC
  Administered 2020-09-27: 300 ug via SUBCUTANEOUS
  Filled 2020-09-27: qty 0.6

## 2020-09-27 NOTE — Patient Instructions (Signed)
Darbepoetin Alfa injection What is this medication? DARBEPOETIN ALFA (dar be POE e tin AL fa) helps your body make more red blood cells. It is used to treat anemia caused by chronic kidney failure and chemotherapy. This medicine may be used for other purposes; ask your health care provider or pharmacist if you have questions. COMMON BRAND NAME(S): Aranesp What should I tell my care team before I take this medication? They need to know if you have any of these conditions: blood clotting disorders or history of blood clots cancer patient not on chemotherapy cystic fibrosis heart disease, such as angina, heart failure, or a history of a heart attack hemoglobin level of 12 g/dL or greater high blood pressure low levels of folate, iron, or vitamin B12 seizures an unusual or allergic reaction to darbepoetin, erythropoietin, albumin, hamster proteins, latex, other medicines, foods, dyes, or preservatives pregnant or trying to get pregnant breast-feeding How should I use this medication? This medicine is for injection into a vein or under the skin. It is usually given by a health care professional in a hospital or clinic setting. If you get this medicine at home, you will be taught how to prepare and give this medicine. Use exactly as directed. Take your medicine at regular intervals. Do not take your medicine more often than directed. It is important that you put your used needles and syringes in a special sharps container. Do not put them in a trash can. If you do not have a sharps container, call your pharmacist or healthcare provider to get one. A special MedGuide will be given to you by the pharmacist with each prescription and refill. Be sure to read this information carefully each time. Talk to your pediatrician regarding the use of this medicine in children. While this medicine may be used in children as young as 1 month of age for selected conditions, precautions do apply. Overdosage: If you  think you have taken too much of this medicine contact a poison control center or emergency room at once. NOTE: This medicine is only for you. Do not share this medicine with others. What if I miss a dose? If you miss a dose, take it as soon as you can. If it is almost time for your next dose, take only that dose. Do not take double or extra doses. What may interact with this medication? Do not take this medicine with any of the following medications: epoetin alfa This list may not describe all possible interactions. Give your health care provider a list of all the medicines, herbs, non-prescription drugs, or dietary supplements you use. Also tell them if you smoke, drink alcohol, or use illegal drugs. Some items may interact with your medicine. What should I watch for while using this medication? Your condition will be monitored carefully while you are receiving this medicine. You may need blood work done while you are taking this medicine. This medicine may cause a decrease in vitamin B6. You should make sure that you get enough vitamin B6 while you are taking this medicine. Discuss the foods you eat and the vitamins you take with your health care professional. What side effects may I notice from receiving this medication? Side effects that you should report to your doctor or health care professional as soon as possible: allergic reactions like skin rash, itching or hives, swelling of the face, lips, or tongue breathing problems changes in vision chest pain confusion, trouble speaking or understanding feeling faint or lightheaded, falls high blood pressure   muscle aches or pains pain, swelling, warmth in the leg rapid weight gain severe headaches sudden numbness or weakness of the face, arm or leg trouble walking, dizziness, loss of balance or coordination seizures (convulsions) swelling of the ankles, feet, hands unusually weak or tired Side effects that usually do not require medical  attention (report to your doctor or health care professional if they continue or are bothersome): diarrhea fever, chills (flu-like symptoms) headaches nausea, vomiting redness, stinging, or swelling at site where injected This list may not describe all possible side effects. Call your doctor for medical advice about side effects. You may report side effects to FDA at 1-800-FDA-1088. Where should I keep my medication? Keep out of the reach of children. Store in a refrigerator between 2 and 8 degrees C (36 and 46 degrees F). Do not freeze. Do not shake. Throw away any unused portion if using a single-dose vial. Throw away any unused medicine after the expiration date. NOTE: This sheet is a summary. It may not cover all possible information. If you have questions about this medicine, talk to your doctor, pharmacist, or health care provider.  2022 Elsevier/Gold Standard (2017-02-14 16:44:20)  

## 2020-09-30 ENCOUNTER — Ambulatory Visit (HOSPITAL_COMMUNITY): Payer: Medicare Other | Attending: Cardiovascular Disease

## 2020-09-30 ENCOUNTER — Other Ambulatory Visit: Payer: Self-pay

## 2020-09-30 DIAGNOSIS — I428 Other cardiomyopathies: Secondary | ICD-10-CM | POA: Insufficient documentation

## 2020-09-30 DIAGNOSIS — I10 Essential (primary) hypertension: Secondary | ICD-10-CM | POA: Diagnosis not present

## 2020-09-30 LAB — ECHOCARDIOGRAM COMPLETE
AR max vel: 1.6 cm2
AV Area VTI: 1.95 cm2
AV Area mean vel: 1.45 cm2
AV Mean grad: 8 mmHg
AV Peak grad: 15.8 mmHg
Ao pk vel: 1.99 m/s
Area-P 1/2: 4.02 cm2
S' Lateral: 4.3 cm

## 2020-10-05 ENCOUNTER — Other Ambulatory Visit: Payer: Self-pay | Admitting: Family Medicine

## 2020-10-05 DIAGNOSIS — E2839 Other primary ovarian failure: Secondary | ICD-10-CM

## 2020-10-08 DIAGNOSIS — I10 Essential (primary) hypertension: Secondary | ICD-10-CM | POA: Diagnosis not present

## 2020-10-08 DIAGNOSIS — E7800 Pure hypercholesterolemia, unspecified: Secondary | ICD-10-CM | POA: Diagnosis not present

## 2020-10-08 DIAGNOSIS — E1169 Type 2 diabetes mellitus with other specified complication: Secondary | ICD-10-CM | POA: Diagnosis not present

## 2020-10-12 ENCOUNTER — Ambulatory Visit: Payer: Medicare Other

## 2020-10-12 ENCOUNTER — Other Ambulatory Visit: Payer: Medicare Other

## 2020-10-13 ENCOUNTER — Other Ambulatory Visit: Payer: Medicare Other

## 2020-10-13 ENCOUNTER — Ambulatory Visit: Payer: Medicare Other

## 2020-10-19 ENCOUNTER — Other Ambulatory Visit: Payer: Medicare Other

## 2020-10-19 ENCOUNTER — Ambulatory Visit: Payer: Medicare Other

## 2020-10-25 DIAGNOSIS — D471 Chronic myeloproliferative disease: Secondary | ICD-10-CM | POA: Diagnosis not present

## 2020-10-25 DIAGNOSIS — Z853 Personal history of malignant neoplasm of breast: Secondary | ICD-10-CM | POA: Diagnosis not present

## 2020-11-03 ENCOUNTER — Ambulatory Visit
Admission: RE | Admit: 2020-11-03 | Discharge: 2020-11-03 | Disposition: A | Discharge status: Home / Self Care | Payer: Medicare Other | Source: Ambulatory Visit | Attending: Adult Health | Admitting: Adult Health

## 2020-11-03 ENCOUNTER — Other Ambulatory Visit: Payer: Self-pay

## 2020-11-03 DIAGNOSIS — Z1231 Encounter for screening mammogram for malignant neoplasm of breast: Secondary | ICD-10-CM

## 2020-11-08 NOTE — Progress Notes (Signed)
ID: Tanya Mitchell OB: [birth year]  MR#: [id number]  CSN#:[id number]  Patient Care Team: Leighton Ruff, MD as PCP - General (Family Medicine) Burtis Junes, NP (Inactive) as Nurse Practitioner (Nurse Practitioner) Tavious Griesinger, Virgie Dad, MD as Consulting Physician (Oncology) Bobbye Charleston, MD as Consulting Physician (Obstetrics and Gynecology) Janyth Contes, MD as Referring Physician (Hematology and Oncology) OTHER MD:   CHIEF COMPLAINTS:   1)  Hx Left Breast Cancer 1995 (s/p left mastectomy)       2) Hx Right Breast Cancer 2007                   3)  Hx Melanoma, Stage IIIA       4) MPN with leukocytosis/thrombocytosis      CURRENT TREATMENT: hydrea; aranesp; allopurinol   INTERVAL HISTORY: Tanya Mitchell returns today for follow-up of her leukocytosis.  She also has a remote history of breast cancer, for which she continues under observation.  She had been on on Hydrea 500 mg daily with Aranesp 300 mcg subQ every 4 weeks.  However she required transfusion in her recent visit to Dr. Joan Mayans at [facility] (10/25/2020).  It was felt that the Hydrea should be held for a time to get her marrow some time to recover and that the EPO should be increased to every 2 weeks.  From Dr. Tamela Gammon summary on 10/25/2020:  75yo F with PMH significant for L sided breast cancer s/p mastectomy and chemo in 1995, R sided breast cancer s/p lumpectomy, XRT, and chemo in 2007, HTN, HLP, DM type 2, chemo induced cardiomyopathy, and stage IIIA melanoma s/p wide local excision who presents to follow up on her diagnosis of MPN NOS.   Pt on hydrea 568m once daily and aranesp 306m sq q4weeks for hb<=10.   Presents for follow up. Hb has fallen to 8s since 08/2020. Since she has been getting once a month counts, she has only received aranesp once a month.   Today, no hsm.   Today, wbc 23.3, no blasts, anc 20,500, hb 7.6, plt 193, ldh and uric acid wnl. Iron profile wnl.   Will transfuse 2u prbc in clinic  today.   Would like pt to stop hydrea at this time.   Would like pt to start getting q2week cbc with diff, aranesp 30070msq q2weeks for hb<=10, transfusion support locally starting next week (2u prbc for hb<8, irradiated blood products).    REVIEW OF SYSTEMS: JilMaddilynn about to go on a cruise which she and her family have planned for 3 years and has been repeatedly delayed for COVID and other reasons.  Specifically [place] leaves [place] for [place] will be there between October 3 and October 7.  She will then be on a cruise and [place].  She feels fine, has not noticed any changes since going off the Hydrea.  Specifically she has had no fevers, rash, pruritus, focal pain, drenching sweats, or unexplained fatigue or weight loss.  A detailed review of systems was otherwise stable   COVID 19 [place]US[place]; had COVID May 2022   HISTORY OF PRESENT ILLNESS: From the earlier summary:  Tanya Mitchell's history of breast cancer dates back to June of 1995, when she had left modified radical mastectomy under [facility] a pT1c pN1, stage IIA invasive ductal carcinoma, grade 2, estrogen receptor 97% positive, progesterone receptor 45% positive, treated according to CALGB 9394, sequential arm (doxorubicin  x3, fourth dose apparently omitted, followed by high-dose cyclophosphamide x3), followed by tamoxifen for 5 years. There has been no evidence of disease recurrence.  Further, in October of 2007 she underwent right lumpectomy for a ductal carcinoma in situ, high-grade, estrogen and progesterone receptor negative, followed by adjuvant radiation.  In July of 2013 the patient was noted to have an irregular mole in her left upper back. It is not clear to me whether this might be related to her prior left breast irradiation. Shave biopsy of this area 09/15/2011 by Dr. Derrel Nip [phone]) showed a superficial spreading  malignant melanoma, with Breslow depth 1.45 mm, Clark's level IV. There was brisk host response and 8 mitoses per high power field were noted. There was focal regression but no definitive ulceration. There was no satellitosis. There was no vascular and urgent. Margins were positive, but cleared by wide excision under Dr Stark Klein 10/10/2011. There was some residual malignant melanoma in situ but margins to 2 cm were obtained, with advancement flap closure of the skin defect. In addition left axillary sentinel lymph node mapping was performed showing metastatic melanoma in the single lymph node removed.  Dalena sought a second opinion at [facility] under Dr Joaquim Lai Collichio. There was a full discussion regarding completion nodal resection, use of adjuvant interferon, and BRAF testing with a view to participation in a research protocol then available. The patient considered all these options and after much discussion the decision was made not to pursue full nodal dissection, partly because the area in question had a ready undergone surgery and radiation. The patient opted against interferon adjuvant therapy because of its very marginal benefit and significant side effects. She declined consideration of a research study and BRAF testing was not performed.  Her subsequent history is as detailed below.   PAST MEDICAL HISTORY: Past Medical History:  Diagnosis Date   Breast cancer ([place]) 1995 and 2007   Cardiomyopathy secondary to chemotherapy [facility])    has improved    Hyperlipidemia    Hypertension    under control, has been on med. x 3 yrs.   Melanoma in situ of back [facility]) 09/2011   left   Personal history of chemotherapy    Personal history of radiation therapy    Skin cancer 2013   Melanoma    PAST SURGICAL HISTORY: Past Surgical History:  Procedure Laterality Date   BREAST LUMPECTOMY Right 12/21/2005   right   MASTECTOMY Left 1995   with breast reconstruction - left    FAMILY HISTORY Family  History  Problem Relation Age of Onset   Lung cancer Father    Cancer Father        lung   Autism Son    Breast cancer Daughter    the patient's father died from complications of lung cancer at the age of 6. He was a heavy smoker. The patient's mother died at the age of 20. Aaminah had no siblings. She underwent genetic testing for breast and ovarian cancer panel April of 2014. There were no demonstrable mutations in the BRCA or the other genes in the panel   GYNECOLOGIC HISTORY:  Menarche age 13, first live birth age 89. She is GX P2. She entered menopause at age 10, when she received her chemotherapy. She did not use hormone replacement. She did use birth control remotely, for approximately 14 years, with no complications.   SOCIAL HISTORY:   (Updated 06/03/2013) [name] used to work at [employer], and she is still "fills in"  there part-time.  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  They recently me moved [place] a new home in the [place] area.  Louine's son [name] is handicapped, and works as an Training and development officer. [name] is Brewing technologist limited. Amory has 3 grandchildren and [place] 2. Akyia grew up in an Database administrator denomination   ADVANCED DIRECTIVES: In place   HEALTH MAINTENANCE:  Social History   Tobacco Use   Smoking status: Former    Types: Cigarettes    Quit date: 02/14/1964    Years since quitting: 56.7   Smokeless tobacco: Never  Vaping Use   Vaping Use: Never used  Substance Use Topics   Alcohol use: Yes    Comment: daily glass of wine   Drug use: No     Colonoscopy: 2011  PAP: November 2014  Bone density: January 2013, osteopenia  Lipid panel:   August 2013/Dr. Drema Dallas   No Known Allergies  Current Outpatient Medications  Medication Sig Dispense Refill   atorvastatin (LIPITOR) 80 MG tablet Take 1 tablet (80 mg  total) by mouth daily. 90 tablet 2   carvedilol (COREG) 25 MG tablet TAKE ONE TABLET BY MOUTH TWO TIMES DAILY. GENERIC EQUIVALENT FOR COREG 180 tablet 3   cholecalciferol (VITAMIN D) 1000 UNITS tablet Take 2,000 Units by mouth daily.      glucosamine-chondroitin 500-400 MG tablet Take 1 tablet by mouth daily.     hydrochlorothiazide (HYDRODIURIL) 25 MG tablet TAKE 1 TABLET BY MOUTH DAILY. 90 tablet 3   hydroxyurea (HYDREA) 500 MG capsule Take 1 capsule (500 mg total) by mouth daily. May take with food to minimize GI side effects.     metFORMIN (GLUCOPHAGE) 500 MG tablet 250 mg twice a day.     ramipril (ALTACE) 10 MG capsule Take 1 capsule (10 mg total) by mouth daily. 90 capsule 3   No current facility-administered medications for this visit.    OBJECTIVE: White woman who appears younger than stated age  55:   11/09/20 1058  BP: 128/63  Pulse: 74  Temp: 97.9 F (36.6 C)  SpO2: 100%      Body mass index is 22.3 kg/m.    ECOG FS:1 - Symptomatic but completely ambulatory Filed Weights   11/09/20 1058  Weight: 118 lb (53.5 kg)    Sclerae unicteric, EOMs intact Wearing a mask No cervical or supraclavicular adenopathy Lungs no rales or rhonchi Heart regular rate and rhythm Abd soft, nontender, positive bowel sounds MSK no focal spinal tenderness, no upper extremity lymphedema Neuro: nonfocal, well oriented, appropriate affect Breasts: The right breast is status post remote lumpectomy and radiation.  There is no evidence of local recurrence.  Left breast and both axillae are benign.   LAB RESULTS:   Lab Results  Component Value Date   WBC 81.0 (HH) 11/09/2020   NEUTROABS PENDING 11/09/2020   HGB 9.1 (L) 11/09/2020   HCT 26.6 (L) 11/09/2020   MCV 112.7 (H) 11/09/2020   PLT 168 11/09/2020      Chemistry      Component Value Date/Time   NA 136 01/28/2020 1315   NA 139 11/30/2015 1225   K 4.7 01/28/2020 1315   K 4.6 11/30/2015 1225   CL 95 (L) 01/28/2020 1315    CL 104 01/18/2012 0948   CO2 27 01/28/2020 1315   CO2 27 11/30/2015 1225  BUN 11 01/28/2020 1315   BUN 13.1 11/30/2015 1225   CREATININE 0.58 01/28/2020 1315   CREATININE 0.7 11/30/2015 1225      Component Value Date/Time   CALCIUM 9.6 01/28/2020 1315   CALCIUM 10.0 11/30/2015 1225   ALKPHOS 57 01/28/2020 1315   ALKPHOS 83 11/30/2015 1225   AST 26 01/28/2020 1315   AST 25 11/30/2015 1225   ALT 23 01/28/2020 1315   ALT 27 11/30/2015 1225   BILITOT 0.4 01/28/2020 1315   BILITOT 0.60 11/30/2015 1225      STUDIES: No results found.   ASSESSMENT: 75 y.o. BRCA negative [place] woman   (1) status post left mastectomy with TRAM reconstruction June of 1995 for a T1c N1, stage IIA invasive ductal carcinoma, grade 2, estrogen receptor 97% positive, progesterone receptor 45% positive, treated adjuvantly according to CALGB 9394 with 3 cycles of doxorubicin followed by 3 cycles of cyclophosphamide, then tamoxifen for 5 years, off therapy completed November of 2000  (2) status post right lumpectomy October 2007 for high-grade ductal carcinoma in situ, with negative margins estrogen and progesterone receptor negative, followed by adjuvant radiation therapy   (3) malignant melanoma, T2a N1a = stage IIIA, as follows:  (a) status post shave biopsy from the right upper back 09/15/2011 for a superficial spreading melanoma, Breslow depth 1.45 mm, Clark's level IV, with focal regression but no ulceration.  (b) status post wide excision and sentinel lymph node sampling 10/10/2011 with residual malignant melanoma in situ, but negative margins; the single sentinel lymph node was involved by melanoma with the largest subcapsular deposit measuring 0.22 mm, no evidence of capsular involvement or extracapsular extension  (c) completion nodal dissection was discussed, but not performed in part because the patient had already had a complete left axillary lymph node dissection at the time of her 1995 left  mastectomy (10 lymph nodes were removed at that time)  (d) adjuvant interferon was discussed with the patient when she visited [facility] in September 2013, but given that it side effects and marginal benefits the patient declined  (e) BRAF testing has not been done on the original tumor; this was also discussed with the patient at the time of her [facility] visit, as it might possibly lead to participation in a research protocol; the patient decided not to pursue that option  (4) PET scan 11/19/18/2015 shows nonspecific uptake only at T6.   (a) MRI of the thoracic spine November 2015 showed no evidence of metastatic disease  (b) repeat PET scan 12/02/2014 shows no residual or recurrent hypermetabolic tumor.  (5) thrombocytosis first noted October 2017, leukocytosis first noted November 2019  (a) bone marrow biopsy 04/03/2018 showed a hypercellular bone marrow with granulocytic and megakaryocytic proliferation, some of the megakaryocytes being small and/or hypolobulated.  There was no increase in blasts, no significant increase in reticulin fibers  (b) cytogenetics from bone marrow biopsy 04/03/2018 showed 46,XX[20].nuc ish (ABL1, BCR)x2  (c) molecular studies 12/18/2017 showed no mutations in JAK2 exons 12 and 14 (including V 617);  MPL exon 10 or CALR exon 9  (d) BCR/ABL 1 drawn 12/18/2017 showed 94% normal nuclei (6% with single fusion)  (e) repeat BCR/ABL 1 on 04/30/2018 showed 100% normal nuclei  (f) repeat BCR/ABL 1 on 02/27/2019: Translocation not detected  (6) bone marrow biopsy on 10/31/2019 shows a hypercellular marrow with myeloid and megakaryocytic hyperplasia but no increase in blasts.  (a) cytogenetics showed no metaphase cells for analysis  (b) Next generation sequencing for myeloid disorders reveals mutations  of  ASXL1, GATA2, and U2AF1. These findings support the impression of a  myeloid neoplasm.    (c) hydroxyurea 500 mg daily started March 2022 with Aranesp support every 4 weeks  (d)  hydroxyurea held on 10/25/2020 secondary to progressive anemia, requiring transfusion; EPO increased to every 2 weeks  (e) hydroxyurea resumed at 500 mg every other day as of 11/09/2020 because of a rapid rise in the white cell count, erythropoietin continued, allopurinol added.   PLAN: Tenzin's white cell count has increased dramatically off Hydrea.  On the peripheral blood I see quite a few immature cells including at least one blast.  Some of this may be simply the bone marrow response from release from the [facility] but I am concerned that she might be transforming and I am obtaining flow cytometry.  I think it would be prudent to resume the Hydrea at this point.  We are going to do it every other day.  I am also starting her on allopurinol 300 mg daily.  She will be in [place] October 3 through 7 and then again October 21 through 31.  In between she will be on a cruise.  I have asked her to see a hematologist in [place] the fifth or 6 October and before that she will send me an email so I can send her the results of the flow cytometry.  She will receive EPO today of course and she will see me again December 14, 2020 for her next Depo dose and to review her blood film again.  Addendum: Dr Joan Mayans returned my call and we discussed the above plan, with which she agrees.  She tells me that she noted 1% blasts on prior counts and so she does not think this is a sudden transformation.  She will arrange for Keysha to have a repeat bone marrow biopsy shortly after her return from [place].  Total encounter time 35 minutes.Chauncey Cruel, MD   11/09/2020 11:07 AM  Oncology and Hematology [facility] [address] [place] [address], [place] [address] Tel. [phone]  Joylene Igo [phone]   I, Wilburn Mylar, am acting as scribe for Dr. Virgie Dad. Alvis Edgell.  I, Lurline Del MD, have reviewed the above documentation for accuracy and completeness, and I agree with the above.   *Total  Encounter Time as defined by the Centers for Medicare and Medicaid Services includes, in addition to the face-to-face time of a patient visit (documented in the note above) non-face-to-face time: obtaining and reviewing outside history, ordering and reviewing medications, tests or procedures, care coordination (communications with other health care professionals or caregivers) and documentation in the medical record.

## 2020-11-09 ENCOUNTER — Other Ambulatory Visit: Payer: Self-pay

## 2020-11-09 ENCOUNTER — Inpatient Hospital Stay: Payer: Medicare Other | Admitting: Oncology

## 2020-11-09 ENCOUNTER — Inpatient Hospital Stay: Payer: Medicare Other

## 2020-11-09 ENCOUNTER — Inpatient Hospital Stay: Payer: Medicare Other | Attending: Oncology

## 2020-11-09 VITALS — BP 128/63 | HR 74 | Temp 97.9°F | Wt 118.0 lb

## 2020-11-09 DIAGNOSIS — D469 Myelodysplastic syndrome, unspecified: Secondary | ICD-10-CM | POA: Diagnosis not present

## 2020-11-09 DIAGNOSIS — D471 Chronic myeloproliferative disease: Secondary | ICD-10-CM

## 2020-11-09 DIAGNOSIS — Z853 Personal history of malignant neoplasm of breast: Secondary | ICD-10-CM

## 2020-11-09 DIAGNOSIS — D72829 Elevated white blood cell count, unspecified: Secondary | ICD-10-CM

## 2020-11-09 DIAGNOSIS — D75839 Thrombocytosis, unspecified: Secondary | ICD-10-CM | POA: Diagnosis not present

## 2020-11-09 DIAGNOSIS — D0511 Intraductal carcinoma in situ of right breast: Secondary | ICD-10-CM

## 2020-11-09 DIAGNOSIS — D72828 Other elevated white blood cell count: Secondary | ICD-10-CM

## 2020-11-09 DIAGNOSIS — I428 Other cardiomyopathies: Secondary | ICD-10-CM

## 2020-11-09 LAB — CBC WITH DIFFERENTIAL/PLATELET
Abs Immature Granulocytes: 15.4 K/uL — ABNORMAL HIGH (ref 0.00–0.07)
Band Neutrophils: 6 %
Basophils Absolute: 0 K/uL (ref 0.0–0.1)
Basophils Relative: 0 %
Blasts: 2 %
Eosinophils Absolute: 0 K/uL (ref 0.0–0.5)
Eosinophils Relative: 0 %
HCT: 26.6 % — ABNORMAL LOW (ref 36.0–46.0)
Hemoglobin: 9.1 g/dL — ABNORMAL LOW (ref 12.0–15.0)
Lymphocytes Relative: 3 %
Lymphs Abs: 2.4 K/uL (ref 0.7–4.0)
MCH: 38.6 pg — ABNORMAL HIGH (ref 26.0–34.0)
MCHC: 34.2 g/dL (ref 30.0–36.0)
MCV: 112.7 fL — ABNORMAL HIGH (ref 80.0–100.0)
Metamyelocytes Relative: 4 %
Monocytes Absolute: 1.6 K/uL — ABNORMAL HIGH (ref 0.1–1.0)
Monocytes Relative: 2 %
Myelocytes: 15 %
Neutro Abs: 59.9 K/uL — ABNORMAL HIGH (ref 1.7–7.7)
Neutrophils Relative %: 68 %
Platelets: 168 K/uL (ref 150–400)
RBC: 2.36 MIL/uL — ABNORMAL LOW (ref 3.87–5.11)
RDW: 21.2 % — ABNORMAL HIGH (ref 11.5–15.5)
WBC: 81 K/uL (ref 4.0–10.5)
nRBC: 0 % (ref 0.0–0.2)

## 2020-11-09 LAB — RETICULOCYTES
Immature Retic Fract: 36.6 % — ABNORMAL HIGH (ref 2.3–15.9)
RBC.: 2.36 MIL/uL — ABNORMAL LOW (ref 3.87–5.11)
Retic Count, Absolute: 34.7 10*3/uL (ref 19.0–186.0)
Retic Ct Pct: 1.5 % (ref 0.4–3.1)

## 2020-11-09 LAB — IRON AND TIBC
Iron: 137 ug/dL (ref 41–142)
Saturation Ratios: 41 % (ref 21–57)
TIBC: 333 ug/dL (ref 236–444)
UIBC: 196 ug/dL (ref 120–384)

## 2020-11-09 LAB — SAVE SMEAR(SSMR), FOR PROVIDER SLIDE REVIEW

## 2020-11-09 LAB — FERRITIN: Ferritin: 280 ng/mL (ref 11–307)

## 2020-11-09 MED ORDER — DARBEPOETIN ALFA 300 MCG/0.6ML IJ SOSY
300.0000 ug | PREFILLED_SYRINGE | Freq: Once | INTRAMUSCULAR | Status: AC
  Administered 2020-11-09: 300 ug via SUBCUTANEOUS
  Filled 2020-11-09: qty 0.6

## 2020-11-09 MED ORDER — ALLOPURINOL 300 MG PO TABS: 300.0000 mg | ORAL_TABLET | Freq: Every day | ORAL | 6 refills | Status: AC

## 2020-11-09 NOTE — Patient Instructions (Signed)
Darbepoetin Alfa injection What is this medication? DARBEPOETIN ALFA (dar be POE e tin AL fa) helps your body make more red blood cells. It is used to treat anemia caused by chronic kidney failure and chemotherapy. This medicine may be used for other purposes; ask your health care provider or pharmacist if you have questions. COMMON BRAND NAME(S): Aranesp What should I tell my care team before I take this medication? They need to know if you have any of these conditions: blood clotting disorders or history of blood clots cancer patient not on chemotherapy cystic fibrosis heart disease, such as angina, heart failure, or a history of a heart attack hemoglobin level of 12 g/dL or greater high blood pressure low levels of folate, iron, or vitamin B12 seizures an unusual or allergic reaction to darbepoetin, erythropoietin, albumin, hamster proteins, latex, other medicines, foods, dyes, or preservatives pregnant or trying to get pregnant breast-feeding How should I use this medication? This medicine is for injection into a vein or under the skin. It is usually given by a health care professional in a hospital or clinic setting. If you get this medicine at home, you will be taught how to prepare and give this medicine. Use exactly as directed. Take your medicine at regular intervals. Do not take your medicine more often than directed. It is important that you put your used needles and syringes in a special sharps container. Do not put them in a trash can. If you do not have a sharps container, call your pharmacist or healthcare provider to get one. A special MedGuide will be given to you by the pharmacist with each prescription and refill. Be sure to read this information carefully each time. Talk to your pediatrician regarding the use of this medicine in children. While this medicine may be used in children as young as 1 month of age for selected conditions, precautions do apply. Overdosage: If you  think you have taken too much of this medicine contact a poison control center or emergency room at once. NOTE: This medicine is only for you. Do not share this medicine with others. What if I miss a dose? If you miss a dose, take it as soon as you can. If it is almost time for your next dose, take only that dose. Do not take double or extra doses. What may interact with this medication? Do not take this medicine with any of the following medications: epoetin alfa This list may not describe all possible interactions. Give your health care provider a list of all the medicines, herbs, non-prescription drugs, or dietary supplements you use. Also tell them if you smoke, drink alcohol, or use illegal drugs. Some items may interact with your medicine. What should I watch for while using this medication? Your condition will be monitored carefully while you are receiving this medicine. You may need blood work done while you are taking this medicine. This medicine may cause a decrease in vitamin B6. You should make sure that you get enough vitamin B6 while you are taking this medicine. Discuss the foods you eat and the vitamins you take with your health care professional. What side effects may I notice from receiving this medication? Side effects that you should report to your doctor or health care professional as soon as possible: allergic reactions like skin rash, itching or hives, swelling of the face, lips, or tongue breathing problems changes in vision chest pain confusion, trouble speaking or understanding feeling faint or lightheaded, falls high blood pressure   muscle aches or pains pain, swelling, warmth in the leg rapid weight gain severe headaches sudden numbness or weakness of the face, arm or leg trouble walking, dizziness, loss of balance or coordination seizures (convulsions) swelling of the ankles, feet, hands unusually weak or tired Side effects that usually do not require medical  attention (report to your doctor or health care professional if they continue or are bothersome): diarrhea fever, chills (flu-like symptoms) headaches nausea, vomiting redness, stinging, or swelling at site where injected This list may not describe all possible side effects. Call your doctor for medical advice about side effects. You may report side effects to FDA at 1-800-FDA-1088. Where should I keep my medication? Keep out of the reach of children. Store in a refrigerator between 2 and 8 degrees C (36 and 46 degrees F). Do not freeze. Do not shake. Throw away any unused portion if using a single-dose vial. Throw away any unused medicine after the expiration date. NOTE: This sheet is a summary. It may not cover all possible information. If you have questions about this medicine, talk to your doctor, pharmacist, or health care provider.  2022 Elsevier/Gold Standard (2017-02-14 16:44:20)  

## 2020-11-10 LAB — SURGICAL PATHOLOGY

## 2020-11-11 LAB — FLOW CYTOMETRY

## 2020-11-17 ENCOUNTER — Other Ambulatory Visit: Payer: Self-pay | Admitting: Oncology

## 2020-11-30 ENCOUNTER — Other Ambulatory Visit: Payer: Medicare Other

## 2020-11-30 ENCOUNTER — Ambulatory Visit: Payer: Medicare Other

## 2020-12-13 NOTE — Progress Notes (Addendum)
ID: Keith Rake Haskett OB: September 01, 1945  MR#: [id number]  CSN#:[id number]  Patient Care Team: Leighton Ruff, MD as PCP - General (Family Medicine) Burtis Junes, NP (Inactive) as Nurse Practitioner (Nurse Practitioner) Magrinat, Virgie Dad, MD as Consulting Physician (Oncology) Bobbye Charleston, MD as Consulting Physician (Obstetrics and Gynecology) Janyth Contes, MD as Referring Physician (Hematology and Oncology) OTHER MD:   CHIEF COMPLAINTS:   1)  Hx Left Breast Cancer 1995 (s/p left mastectomy)       2) Hx Right Breast Cancer 2007                   3)  Hx Melanoma, Stage IIIA       4) MPN with leukocytosis/thrombocytosis      CURRENT TREATMENT: hydrea; aranesp; allopurinol   INTERVAL HISTORY: Tanya Mitchell returns today for follow-up of her myeloproliferative syndrome.  She also has a remote history of breast cancer, for which she continues under observation.  She had been on on Hydrea 500 mg daily with Aranesp 300 mcg subQ every 4 weeks.  However she required transfusion in her recent visit to Dr. Joan Mayans at [facility] (10/25/2020).  It was felt that the Hydrea should be held for a time to get her marrow some time to recover and that the EPO should be increased to every 2 weeks.  Holding the Hydrea resulted in a rapid rise in her total white cell count.  10.5 K/uL 81.0 High Panic   28.5 High   24.1 High   11.   Her Hydrea was then resumed at a lower dose, 500 mg every other day.  Today her total white cell count is 72.9 which is stable.  Her hemoglobin is down to 7.2.  Her darbepoetin is being increased as of today to 500 mcg every 3 weeks (up from 300 mcg.  Flow cytometry 11/09/2020 showed 2% circulating blasts.  This was not felt to be a significant change from baseline.  She has a follow-up appointment at [facility] 12/16/2020 and is expected to undergo repeat bone marrow biopsy at that time   REVIEW OF SYSTEMS: Tanya Mitchell traveled in [place] between 11/09/2020 and just returned  last night 12/13/2020.  She denies any shortness of breath or fatigue in fact has had excellent energy as she walked around the [place] and dined in [place].  She has had no fevers, drenching sweats, bleeding or bruising or other symptoms of concern. She feels just fine.  A detailed review of systems today was stable   COVID 19 VACCINATION STATUS: [place] x2, most recently 04/2019; had COVID May 2022   HISTORY OF PRESENT ILLNESS: From the earlier summary:  Tanya Mitchell's history of breast cancer dates back to June of 1995, when she had left modified radical mastectomy under [facility] for a pT1c pN1, stage IIA invasive ductal carcinoma, grade 2, estrogen receptor 97% positive, progesterone receptor 45% positive, treated according to CALGB 9394, sequential arm (doxorubicin x3, fourth dose apparently omitted, followed by high-dose cyclophosphamide x3), followed by tamoxifen for 5 years. There has been no evidence of disease recurrence.  Further, in October of 2007 she underwent right lumpectomy for a ductal carcinoma in situ, high-grade, estrogen and progesterone receptor negative, followed by adjuvant radiation.  In July of 2013 the patient was noted to have an irregular mole in her left upper back. It is not clear to me whether this might be related to her prior left breast irradiation. Shave biopsy of this area 09/15/2011 by Dr.  Steinhelfer [phone]) showed a superficial spreading malignant melanoma, with Breslow depth 1.45 mm, Clark's level IV. There was brisk host response and 8 mitoses per high power field were noted. There was focal regression but no definitive ulceration. There was no satellitosis. There was no vascular and urgent. Margins were positive, but cleared by wide excision under Dr Stark Klein 10/10/2011. There was some residual malignant melanoma in situ but margins to 2 cm were obtained, with advancement flap closure of the skin defect. In addition left axillary sentinel lymph  node mapping was performed showing metastatic melanoma in the single lymph node removed.  Lacresia sought a second opinion at [facility] under Dr Joaquim Lai Collichio. There was a full discussion regarding completion nodal resection, use of adjuvant interferon, and BRAF testing with a view to participation in a research protocol then available. The patient considered all these options and after much discussion the decision was made not to pursue full nodal dissection, partly because the area in question had a ready undergone surgery and radiation. The patient opted against interferon adjuvant therapy because of its very marginal benefit and significant side effects. She declined consideration of a research study and BRAF testing was not performed.  Her subsequent history is as detailed below.   PAST MEDICAL HISTORY: Past Medical History:  Diagnosis Date   Breast cancer ([place]) 1995 and 2007   Cardiomyopathy secondary to chemotherapy [facility])    has improved    Hyperlipidemia    Hypertension    under control, has been on med. x 3 yrs.   Melanoma in situ of back [facility]) 09/2011   left   Personal history of chemotherapy    Personal history of radiation therapy    Skin cancer 2013   Melanoma    PAST SURGICAL HISTORY: Past Surgical History:  Procedure Laterality Date   BREAST LUMPECTOMY Right 12/21/2005   right   MASTECTOMY Left 1995   with breast reconstruction - left    FAMILY HISTORY Family History  Problem Relation Age of Onset   Lung cancer Father    Cancer Father        lung   Autism Son    Breast cancer Daughter    the patient's father died from complications of lung cancer at the age of 1. He was a heavy smoker. The patient's mother died at the age of 76. Tanya Mitchell had no siblings. She underwent genetic testing for breast and ovarian cancer panel April of 2014. There were no demonstrable mutations in the BRCA or the other genes in the panel   GYNECOLOGIC HISTORY:  Menarche age 18, first live  birth age 32. She is GX P2. She entered menopause at age 1, when she received her chemotherapy. She did not use hormone replacement. She did use birth control remotely, for approximately 14 years, with no complications.   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  They recently moved to a new home in the [facility] [place] [facility] area.  Belina's son [name] is handicapped, and works as an Training and development officer. [name] is Brewing technologist limited. Myria has 3 grandchildren and [place] 2. Ansley grew up in an Database administrator denomination   ADVANCED DIRECTIVES: In place   HEALTH MAINTENANCE:  Social History   Tobacco Use  Smoking status: Former    Types: Cigarettes    Quit date: 02/14/1964    Years since quitting: 56.8   Smokeless tobacco: Never  Vaping Use   Vaping Use: Never used  Substance Use Topics   Alcohol use: Yes    Comment: daily glass of wine   Drug use: No     Colonoscopy: 2011  PAP: November 2014  Bone density: January 2013, osteopenia  Lipid panel:   August 2013/Dr. Drema Dallas   No Known Allergies  Current Outpatient Medications  Medication Sig Dispense Refill   allopurinol (ZYLOPRIM) 300 MG tablet Take 1 tablet (300 mg total) by mouth daily. 60 tablet 6   atorvastatin (LIPITOR) 80 MG tablet Take 1 tablet (80 mg total) by mouth daily. 90 tablet 2   carvedilol (COREG) 25 MG tablet TAKE ONE TABLET BY MOUTH TWO TIMES DAILY. GENERIC EQUIVALENT FOR COREG 180 tablet 3   cholecalciferol (VITAMIN D) 1000 UNITS tablet Take 2,000 Units by mouth daily.      glucosamine-chondroitin 500-400 MG tablet Take 1 tablet by mouth daily.     hydrochlorothiazide (HYDRODIURIL) 25 MG tablet TAKE 1 TABLET BY MOUTH DAILY. 90 tablet 3   hydroxyurea (HYDREA)  500 MG capsule Take 1 capsule (500 mg total) by mouth daily. May take with food to minimize GI side effects.     metFORMIN (GLUCOPHAGE) 500 MG tablet 250 mg twice a day.     ramipril (ALTACE) 10 MG capsule Take 1 capsule (10 mg total) by mouth daily. 90 capsule 3   No current facility-administered medications for this visit.    OBJECTIVE: White woman who appears younger than stated age  There were no vitals filed for this visit.     There is no height or weight on file to calculate BMI.    ECOG FS:1 - Symptomatic but completely ambulatory There were no vitals filed for this visit.    LAB RESULTS:   Lab Results  Component Value Date   WBC 72.9 (HH) 12/14/2020   NEUTROABS PENDING 12/14/2020   HGB 7.2 (L) 12/14/2020   HCT 20.4 (L) 12/14/2020   MCV 113.3 (H) 12/14/2020   PLT 151 12/14/2020      Chemistry      Component Value Date/Time   NA 136 01/28/2020 1315   NA 139 11/30/2015 1225   K 4.7 01/28/2020 1315   K 4.6 11/30/2015 1225   CL 95 (L) 01/28/2020 1315   CL 104 01/18/2012 0948   CO2 27 01/28/2020 1315   CO2 27 11/30/2015 1225   BUN 11 01/28/2020 1315   BUN 13.1 11/30/2015 1225   CREATININE 0.58 01/28/2020 1315   CREATININE 0.7 11/30/2015 1225      Component Value Date/Time   CALCIUM 9.6 01/28/2020 1315   CALCIUM 10.0 11/30/2015 1225   ALKPHOS 57 01/28/2020 1315   ALKPHOS 83 11/30/2015 1225   AST 26 01/28/2020 1315   AST 25 11/30/2015 1225   ALT 23 01/28/2020 1315   ALT 27 11/30/2015 1225   BILITOT 0.4 01/28/2020 1315   BILITOT 0.60 11/30/2015 1225      STUDIES: No results found.   ASSESSMENT: 75 y.o. BRCA negative [place] woman   (1) status post left mastectomy with TRAM reconstruction June of 1995 for a T1c N1, stage IIA invasive ductal carcinoma, grade 2, estrogen receptor 97% positive, progesterone receptor 45% positive, treated adjuvantly according to CALGB 9394 with 3 cycles of doxorubicin followed by 3 cycles of cyclophosphamide, then  tamoxifen for 5  years, off therapy completed November of 2000  (2) status post right lumpectomy October 2007 for high-grade ductal carcinoma in situ, with negative margins estrogen and progesterone receptor negative, followed by adjuvant radiation therapy   (3) malignant melanoma, T2a N1a = stage IIIA, as follows:  (a) status post shave biopsy from the right upper back 09/15/2011 for a superficial spreading melanoma, Breslow depth 1.45 mm, Clark's level IV, with focal regression but no ulceration.  (b) status post wide excision and sentinel lymph node sampling 10/10/2011 with residual malignant melanoma in situ, but negative margins; the single sentinel lymph node was involved by melanoma with the largest subcapsular deposit measuring 0.22 mm, no evidence of capsular involvement or extracapsular extension  (c) completion nodal dissection was discussed, but not performed in part because the patient had already had a complete left axillary lymph node dissection at the time of her 1995 left mastectomy (10 lymph nodes were removed at that time)  (d) adjuvant interferon was discussed with the patient when she visited [facility] in September 2013, but given that it side effects and marginal benefits the patient declined  (e) BRAF testing has not been done on the original tumor; this was also discussed with the patient at the time of her [facility] visit, as it might possibly lead to participation in a research protocol; the patient decided not to pursue that option  (4) PET scan 11/19/18/2015 shows nonspecific uptake only at T6.   (a) MRI of the thoracic spine November 2015 showed no evidence of metastatic disease  (b) repeat PET scan 12/02/2014 shows no residual or recurrent hypermetabolic tumor.  (5) thrombocytosis first noted October 2017, leukocytosis first noted November 2019  (a) bone marrow biopsy 04/03/2018 showed a hypercellular bone marrow with granulocytic and megakaryocytic proliferation, some of the  megakaryocytes being small and/or hypolobulated.  There was no increase in blasts, no significant increase in reticulin fibers  (b) cytogenetics from bone marrow biopsy 04/03/2018 showed 46,XX[20].nuc ish (ABL1, BCR)x2  (c) molecular studies 12/18/2017 showed no mutations in JAK2 exons 12 and 14 (including V 617);  MPL exon 10 or CALR exon 9  (d) BCR/ABL 1 drawn 12/18/2017 showed 94% normal nuclei (6% with single fusion)  (e) repeat BCR/ABL 1 on 04/30/2018 showed 100% normal nuclei  (f) repeat BCR/ABL 1 on 02/27/2019: Translocation not detected  (6) bone marrow biopsy on 10/31/2019 shows a hypercellular marrow with myeloid and megakaryocytic hyperplasia but no increase in blasts.  (a) cytogenetics showed no metaphase cells for analysis  (b) Next generation sequencing for myeloid disorders reveals mutations of  ASXL1, GATA2, and U2AF1. These findings support the impression of a  myeloid neoplasm.    (c) hydroxyurea 500 mg daily started March 2022 with Aranesp support every 4 weeks  (d) hydroxyurea held on 10/25/2020 secondary to progressive anemia, requiring transfusion; EPO increased to every 2 weeks  (e) hydroxyurea resumed at 500 mg every other day as of 11/09/2020 because of a rapid rise in the white cell count, erythropoietin continued, allopurinol added.   PLAN: Tanya Mitchell's white cell count is stable on her current dose of Hydrea, 500 mg every other day.  Her hemoglobin is 7.2.  She is however asymptomatic.  She already has an appointment 2 days from now at [facility].  We faxed the labs to Dr. Tamela Gammon office today and we anticipate Tanya Mitchell will be transfused there and also have a bone marrow biopsy.  She will receive EPO today and every 3 weeks at the higher dose.  Hopefully this will improve the hemoglobin.  I will see her again briefly with her 01/25/2021 labs and then she will see my partner Dr. Lorenso Courier with her January dose to establish herself in his service.   Chauncey Cruel, MD    12/14/2020 8:56 AM  Oncology and Hematology [facility] [place], [place] [address] Tel. [phone]  Joylene Igo [phone]   I, Wilburn Mylar, am acting as scribe for Dr. Sarajane Jews C. Magrinat.  I, Lurline Del MD, have reviewed the above documentation for accuracy and completeness, and I agree with the above.   *Total Encounter Time as defined by the Centers for Medicare and Medicaid Services includes, in addition to the face-to-face time of a patient visit (documented in the note above) non-face-to-face time: obtaining and reviewing outside history, ordering and reviewing medications, tests or procedures, care coordination (communications with other health care professionals or caregivers) and documentation in the medical record.

## 2020-12-14 ENCOUNTER — Inpatient Hospital Stay: Payer: Medicare Other | Attending: Oncology

## 2020-12-14 ENCOUNTER — Other Ambulatory Visit: Payer: Self-pay

## 2020-12-14 ENCOUNTER — Inpatient Hospital Stay: Payer: Medicare Other

## 2020-12-14 ENCOUNTER — Inpatient Hospital Stay: Payer: Medicare Other | Admitting: Oncology

## 2020-12-14 VITALS — BP 114/54 | HR 69 | Temp 98.4°F | Resp 16

## 2020-12-14 DIAGNOSIS — D46Z Other myelodysplastic syndromes: Secondary | ICD-10-CM | POA: Insufficient documentation

## 2020-12-14 DIAGNOSIS — D0511 Intraductal carcinoma in situ of right breast: Secondary | ICD-10-CM | POA: Diagnosis not present

## 2020-12-14 DIAGNOSIS — I428 Other cardiomyopathies: Secondary | ICD-10-CM

## 2020-12-14 DIAGNOSIS — D72829 Elevated white blood cell count, unspecified: Secondary | ICD-10-CM

## 2020-12-14 DIAGNOSIS — D469 Myelodysplastic syndrome, unspecified: Secondary | ICD-10-CM

## 2020-12-14 DIAGNOSIS — C946 Myelodysplastic disease, not classified: Secondary | ICD-10-CM

## 2020-12-14 DIAGNOSIS — C4359 Malignant melanoma of other part of trunk: Secondary | ICD-10-CM

## 2020-12-14 DIAGNOSIS — D72828 Other elevated white blood cell count: Secondary | ICD-10-CM

## 2020-12-14 DIAGNOSIS — D75839 Thrombocytosis, unspecified: Secondary | ICD-10-CM

## 2020-12-14 LAB — CBC WITH DIFFERENTIAL/PLATELET
Abs Immature Granulocytes: 15.64 10*3/uL — ABNORMAL HIGH (ref 0.00–0.07)
Basophils Absolute: 0.1 10*3/uL (ref 0.0–0.1)
Basophils Relative: 0 %
Eosinophils Absolute: 0 10*3/uL (ref 0.0–0.5)
Eosinophils Relative: 0 %
HCT: 20.4 % — ABNORMAL LOW (ref 36.0–46.0)
Hemoglobin: 7.2 g/dL — ABNORMAL LOW (ref 12.0–15.0)
Immature Granulocytes: 21 %
Lymphocytes Relative: 6 %
Lymphs Abs: 4 10*3/uL (ref 0.7–4.0)
MCH: 40 pg — ABNORMAL HIGH (ref 26.0–34.0)
MCHC: 35.3 g/dL (ref 30.0–36.0)
MCV: 113.3 fL — ABNORMAL HIGH (ref 80.0–100.0)
Monocytes Absolute: 1.2 10*3/uL — ABNORMAL HIGH (ref 0.1–1.0)
Monocytes Relative: 2 %
Neutro Abs: 52 10*3/uL — ABNORMAL HIGH (ref 1.7–7.7)
Neutrophils Relative %: 71 %
Platelets: 151 10*3/uL (ref 150–400)
RBC: 1.8 MIL/uL — ABNORMAL LOW (ref 3.87–5.11)
RDW: 20.6 % — ABNORMAL HIGH (ref 11.5–15.5)
Smear Review: NORMAL
WBC: 72.9 10*3/uL (ref 4.0–10.5)
nRBC: 0 % (ref 0.0–0.2)

## 2020-12-14 LAB — IRON AND TIBC
Iron: 174 ug/dL — ABNORMAL HIGH (ref 41–142)
Saturation Ratios: 48 % (ref 21–57)
TIBC: 363 ug/dL (ref 236–444)
UIBC: 189 ug/dL (ref 120–384)

## 2020-12-14 LAB — RETICULOCYTES
Immature Retic Fract: 41.4 % — ABNORMAL HIGH (ref 2.3–15.9)
RBC.: 1.81 MIL/uL — ABNORMAL LOW (ref 3.87–5.11)
Retic Count, Absolute: 24.6 10*3/uL (ref 19.0–186.0)
Retic Ct Pct: 1.4 % (ref 0.4–3.1)

## 2020-12-14 LAB — FERRITIN: Ferritin: 252 ng/mL (ref 11–307)

## 2020-12-14 MED ORDER — DARBEPOETIN ALFA 500 MCG/ML IJ SOSY
500.0000 ug | PREFILLED_SYRINGE | Freq: Once | INTRAMUSCULAR | Status: AC
  Administered 2020-12-14: 500 ug via SUBCUTANEOUS
  Filled 2020-12-14: qty 1

## 2020-12-16 DIAGNOSIS — D471 Chronic myeloproliferative disease: Secondary | ICD-10-CM | POA: Diagnosis not present

## 2020-12-16 DIAGNOSIS — Z853 Personal history of malignant neoplasm of breast: Secondary | ICD-10-CM | POA: Diagnosis not present

## 2020-12-16 DIAGNOSIS — Z9221 Personal history of antineoplastic chemotherapy: Secondary | ICD-10-CM | POA: Diagnosis not present

## 2020-12-16 DIAGNOSIS — Z85820 Personal history of malignant melanoma of skin: Secondary | ICD-10-CM | POA: Diagnosis not present

## 2020-12-16 DIAGNOSIS — Z923 Personal history of irradiation: Secondary | ICD-10-CM | POA: Diagnosis not present

## 2020-12-16 DIAGNOSIS — I10 Essential (primary) hypertension: Secondary | ICD-10-CM | POA: Diagnosis not present

## 2020-12-16 DIAGNOSIS — E119 Type 2 diabetes mellitus without complications: Secondary | ICD-10-CM | POA: Diagnosis not present

## 2020-12-21 ENCOUNTER — Ambulatory Visit: Payer: Medicare Other

## 2020-12-21 ENCOUNTER — Other Ambulatory Visit: Payer: Medicare Other

## 2021-01-03 ENCOUNTER — Other Ambulatory Visit: Payer: Self-pay | Admitting: *Deleted

## 2021-01-03 DIAGNOSIS — L570 Actinic keratosis: Secondary | ICD-10-CM | POA: Diagnosis not present

## 2021-01-03 DIAGNOSIS — L821 Other seborrheic keratosis: Secondary | ICD-10-CM | POA: Diagnosis not present

## 2021-01-03 DIAGNOSIS — Z85820 Personal history of malignant melanoma of skin: Secondary | ICD-10-CM | POA: Diagnosis not present

## 2021-01-04 ENCOUNTER — Encounter: Payer: Self-pay | Admitting: Oncology

## 2021-01-04 ENCOUNTER — Other Ambulatory Visit: Payer: Self-pay

## 2021-01-04 ENCOUNTER — Other Ambulatory Visit: Payer: Self-pay | Admitting: Oncology

## 2021-01-04 ENCOUNTER — Inpatient Hospital Stay: Payer: Medicare Other

## 2021-01-04 VITALS — BP 125/60 | HR 71 | Temp 98.7°F | Resp 16

## 2021-01-04 DIAGNOSIS — D72829 Elevated white blood cell count, unspecified: Secondary | ICD-10-CM

## 2021-01-04 DIAGNOSIS — D0511 Intraductal carcinoma in situ of right breast: Secondary | ICD-10-CM

## 2021-01-04 DIAGNOSIS — D46Z Other myelodysplastic syndromes: Secondary | ICD-10-CM | POA: Diagnosis not present

## 2021-01-04 DIAGNOSIS — C4359 Malignant melanoma of other part of trunk: Secondary | ICD-10-CM

## 2021-01-04 DIAGNOSIS — D469 Myelodysplastic syndrome, unspecified: Secondary | ICD-10-CM

## 2021-01-04 DIAGNOSIS — D72828 Other elevated white blood cell count: Secondary | ICD-10-CM

## 2021-01-04 DIAGNOSIS — I428 Other cardiomyopathies: Secondary | ICD-10-CM

## 2021-01-04 DIAGNOSIS — D75839 Thrombocytosis, unspecified: Secondary | ICD-10-CM

## 2021-01-04 LAB — CBC WITH DIFFERENTIAL/PLATELET
Abs Immature Granulocytes: 11.2 10*3/uL — ABNORMAL HIGH (ref 0.00–0.07)
Band Neutrophils: 6 %
Basophils Absolute: 0 10*3/uL (ref 0.0–0.1)
Basophils Relative: 0 %
Blasts: 2 %
Eosinophils Absolute: 0 10*3/uL (ref 0.0–0.5)
Eosinophils Relative: 0 %
HCT: 26.1 % — ABNORMAL LOW (ref 36.0–46.0)
Hemoglobin: 8.7 g/dL — ABNORMAL LOW (ref 12.0–15.0)
Lymphocytes Relative: 5 %
Lymphs Abs: 2.9 10*3/uL (ref 0.7–4.0)
MCH: 35.5 pg — ABNORMAL HIGH (ref 26.0–34.0)
MCHC: 33.3 g/dL (ref 30.0–36.0)
MCV: 106.5 fL — ABNORMAL HIGH (ref 80.0–100.0)
Metamyelocytes Relative: 9 %
Monocytes Absolute: 0.6 10*3/uL (ref 0.1–1.0)
Monocytes Relative: 1 %
Myelocytes: 10 %
Neutro Abs: 42.9 10*3/uL — ABNORMAL HIGH (ref 1.7–7.7)
Neutrophils Relative %: 67 %
Platelets: 195 10*3/uL (ref 150–400)
RBC: 2.45 MIL/uL — ABNORMAL LOW (ref 3.87–5.11)
RDW: 21.9 % — ABNORMAL HIGH (ref 11.5–15.5)
Smear Review: NORMAL
WBC: 58.8 10*3/uL (ref 4.0–10.5)
nRBC: 0 % (ref 0.0–0.2)

## 2021-01-04 LAB — IRON AND TIBC
Iron: 171 ug/dL — ABNORMAL HIGH (ref 41–142)
Saturation Ratios: 56 % (ref 21–57)
TIBC: 304 ug/dL (ref 236–444)
UIBC: 133 ug/dL (ref 120–384)

## 2021-01-04 LAB — SAVE SMEAR(SSMR), FOR PROVIDER SLIDE REVIEW

## 2021-01-04 LAB — TYPE AND SCREEN
ABO/RH(D): AB POS
Antibody Screen: NEGATIVE

## 2021-01-04 LAB — FERRITIN: Ferritin: 420 ng/mL — ABNORMAL HIGH (ref 11–307)

## 2021-01-04 LAB — RETICULOCYTES
Immature Retic Fract: 28.4 % — ABNORMAL HIGH (ref 2.3–15.9)
RBC.: 2.43 MIL/uL — ABNORMAL LOW (ref 3.87–5.11)
Retic Count, Absolute: 31.1 10*3/uL (ref 19.0–186.0)
Retic Ct Pct: 1.3 % (ref 0.4–3.1)

## 2021-01-04 MED ORDER — EPOETIN ALFA-EPBX 20000 UNIT/ML IJ SOLN
20000.0000 [IU] | Freq: Once | INTRAMUSCULAR | Status: AC
  Administered 2021-01-04: 20000 [IU] via SUBCUTANEOUS
  Filled 2021-01-04: qty 1

## 2021-01-04 MED ORDER — EPOETIN ALFA-EPBX 40000 UNIT/ML IJ SOLN
40000.0000 [IU] | Freq: Once | INTRAMUSCULAR | Status: AC
  Administered 2021-01-04: 40000 [IU] via SUBCUTANEOUS
  Filled 2021-01-04: qty 1

## 2021-01-04 NOTE — Progress Notes (Signed)
Aranesp orders changed to Retacrit per insurance preferred biosimilar. Retacrit 60,000 Q 3 weeks per discussion with provider.  Raul Del [place]ta, [place], BCPS, BCOP 01/04/2021 9:29 AM

## 2021-01-04 NOTE — Patient Instructions (Signed)
Epoetin Alfa injection °What is this medication? °EPOETIN ALFA (e POE e tin AL fa) helps your body make more red blood cells. This medicine is used to treat anemia caused by chronic kidney disease, cancer chemotherapy, or HIV-therapy. It may also be used before surgery if you have anemia. °This medicine may be used for other purposes; ask your health care provider or pharmacist if you have questions. °COMMON BRAND NAME(S): Epogen, Procrit, Retacrit °What should I tell my care team before I take this medication? °They need to know if you have any of these conditions: °cancer °heart disease °high blood pressure °history of blood clots °history of stroke °low levels of folate, iron, or vitamin B12 in the blood °seizures °an unusual or allergic reaction to erythropoietin, albumin, benzyl alcohol, hamster proteins, other medicines, foods, dyes, or preservatives °pregnant or trying to get pregnant °breast-feeding °How should I use this medication? °This medicine is for injection into a vein or under the skin. It is usually given by a health care professional in a hospital or clinic setting. °If you get this medicine at home, you will be taught how to prepare and give this medicine. Use exactly as directed. Take your medicine at regular intervals. Do not take your medicine more often than directed. °It is important that you put your used needles and syringes in a special sharps container. Do not put them in a trash can. If you do not have a sharps container, call your pharmacist or healthcare provider to get one. °A special MedGuide will be given to you by the pharmacist with each prescription and refill. Be sure to read this information carefully each time. °Talk to your pediatrician regarding the use of this medicine in children. While this drug may be prescribed for selected conditions, precautions do apply. °Overdosage: If you think you have taken too much of this medicine contact a poison control center or emergency  room at once. °NOTE: This medicine is only for you. Do not share this medicine with others. °What if I miss a dose? °If you miss a dose, take it as soon as you can. If it is almost time for your next dose, take only that dose. Do not take double or extra doses. °What may interact with this medication? °Interactions have not been studied. °This list may not describe all possible interactions. Give your health care provider a list of all the medicines, herbs, non-prescription drugs, or dietary supplements you use. Also tell them if you smoke, drink alcohol, or use illegal drugs. Some items may interact with your medicine. °What should I watch for while using this medication? °Your condition will be monitored carefully while you are receiving this medicine. °You may need blood work done while you are taking this medicine. °This medicine may cause a decrease in vitamin B6. You should make sure that you get enough vitamin B6 while you are taking this medicine. Discuss the foods you eat and the vitamins you take with your health care professional. °What side effects may I notice from receiving this medication? °Side effects that you should report to your doctor or health care professional as soon as possible: °allergic reactions like skin rash, itching or hives, swelling of the face, lips, or tongue °seizures °signs and symptoms of a blood clot such as breathing problems; changes in vision; chest pain; severe, sudden headache; pain, swelling, warmth in the leg; trouble speaking; sudden numbness or weakness of the face, arm or leg °signs and symptoms of a stroke like   changes in vision; confusion; trouble speaking or understanding; severe headaches; sudden numbness or weakness of the face, arm or leg; trouble walking; dizziness; loss of balance or coordination °Side effects that usually do not require medical attention (report to your doctor or health care professional if they continue or are  bothersome): °chills °cough °dizziness °fever °headaches °joint pain °muscle cramps °muscle pain °nausea, vomiting °pain, redness, or irritation at site where injected °This list may not describe all possible side effects. Call your doctor for medical advice about side effects. You may report side effects to FDA at 1-800-FDA-1088. °Where should I keep my medication? °Keep out of the reach of children. °Store in a refrigerator between 2 and 8 degrees C (36 and 46 degrees F). Do not freeze or shake. Throw away any unused portion if using a single-dose vial. Multi-dose vials can be kept in the refrigerator for up to 21 days after the initial dose. Throw away unused medicine. °NOTE: This sheet is a summary. It may not cover all possible information. If you have questions about this medicine, talk to your doctor, pharmacist, or health care provider. °© 2022 Elsevier/Gold Standard (2016-10-03 00:00:00) ° °

## 2021-01-04 NOTE — Addendum Note (Signed)
Addended by: Neysa Hotter on: 01/04/2021 09:28 AM   Modules accepted: Orders

## 2021-01-04 NOTE — Progress Notes (Signed)
She has an Other Myelodysplastic Syndrome   Thanks  GM

## 2021-01-10 ENCOUNTER — Other Ambulatory Visit: Payer: Self-pay | Admitting: *Deleted

## 2021-01-10 ENCOUNTER — Other Ambulatory Visit: Payer: Self-pay

## 2021-01-10 ENCOUNTER — Inpatient Hospital Stay: Payer: Medicare Other

## 2021-01-10 ENCOUNTER — Telehealth: Payer: Self-pay | Admitting: *Deleted

## 2021-01-10 DIAGNOSIS — I428 Other cardiomyopathies: Secondary | ICD-10-CM

## 2021-01-10 DIAGNOSIS — D46Z Other myelodysplastic syndromes: Secondary | ICD-10-CM | POA: Diagnosis not present

## 2021-01-10 DIAGNOSIS — D471 Chronic myeloproliferative disease: Secondary | ICD-10-CM

## 2021-01-10 DIAGNOSIS — D72829 Elevated white blood cell count, unspecified: Secondary | ICD-10-CM

## 2021-01-10 DIAGNOSIS — D75839 Thrombocytosis, unspecified: Secondary | ICD-10-CM

## 2021-01-10 LAB — CBC WITH DIFFERENTIAL (CANCER CENTER ONLY)
Abs Immature Granulocytes: 14.47 10*3/uL — ABNORMAL HIGH (ref 0.00–0.07)
Basophils Absolute: 0.1 10*3/uL (ref 0.0–0.1)
Basophils Relative: 0 %
Eosinophils Absolute: 0.2 10*3/uL (ref 0.0–0.5)
Eosinophils Relative: 0 %
HCT: 25.3 % — ABNORMAL LOW (ref 36.0–46.0)
Hemoglobin: 8.8 g/dL — ABNORMAL LOW (ref 12.0–15.0)
Immature Granulocytes: 17 %
Lymphocytes Relative: 6 %
Lymphs Abs: 4.8 10*3/uL — ABNORMAL HIGH (ref 0.7–4.0)
MCH: 37 pg — ABNORMAL HIGH (ref 26.0–34.0)
MCHC: 34.8 g/dL (ref 30.0–36.0)
MCV: 106.3 fL — ABNORMAL HIGH (ref 80.0–100.0)
Monocytes Absolute: 1.1 10*3/uL — ABNORMAL HIGH (ref 0.1–1.0)
Monocytes Relative: 1 %
Neutro Abs: 64.2 10*3/uL — ABNORMAL HIGH (ref 1.7–7.7)
Neutrophils Relative %: 76 %
Platelet Count: 182 10*3/uL (ref 150–400)
RBC: 2.38 MIL/uL — ABNORMAL LOW (ref 3.87–5.11)
RDW: 22.4 % — ABNORMAL HIGH (ref 11.5–15.5)
WBC Count: 84.7 10*3/uL (ref 4.0–10.5)
nRBC: 0 % (ref 0.0–0.2)

## 2021-01-10 LAB — RETICULOCYTES
Immature Retic Fract: 37.1 % — ABNORMAL HIGH (ref 2.3–15.9)
RBC.: 2.35 MIL/uL — ABNORMAL LOW (ref 3.87–5.11)
Retic Count, Absolute: 36 10*3/uL (ref 19.0–186.0)
Retic Ct Pct: 1.5 % (ref 0.4–3.1)

## 2021-01-10 LAB — SAMPLE TO BLOOD BANK

## 2021-01-10 LAB — IRON AND TIBC
Iron: 220 ug/dL — ABNORMAL HIGH (ref 41–142)
Saturation Ratios: 68 % — ABNORMAL HIGH (ref 21–57)
TIBC: 324 ug/dL (ref 236–444)
UIBC: 104 ug/dL — ABNORMAL LOW (ref 120–384)

## 2021-01-10 LAB — FERRITIN: Ferritin: 1188 ng/mL — ABNORMAL HIGH (ref 11–307)

## 2021-01-10 MED ORDER — RAMIPRIL 5 MG PO CAPS
5.0000 mg | ORAL_CAPSULE | Freq: Every day | ORAL | 3 refills | Status: DC
Start: 2021-01-10 — End: 2021-04-15

## 2021-01-10 NOTE — Telephone Encounter (Signed)
This RN spoke with pt per her call stating onset of severe fatigue since last week post " change in injection for my red blood cells"

## 2021-01-17 DIAGNOSIS — I10 Essential (primary) hypertension: Secondary | ICD-10-CM | POA: Diagnosis not present

## 2021-01-17 DIAGNOSIS — E1169 Type 2 diabetes mellitus with other specified complication: Secondary | ICD-10-CM | POA: Diagnosis not present

## 2021-01-17 DIAGNOSIS — E7800 Pure hypercholesterolemia, unspecified: Secondary | ICD-10-CM | POA: Diagnosis not present

## 2021-01-24 ENCOUNTER — Other Ambulatory Visit: Payer: Self-pay | Admitting: Oncology

## 2021-01-24 ENCOUNTER — Other Ambulatory Visit: Payer: Self-pay | Admitting: *Deleted

## 2021-01-24 ENCOUNTER — Other Ambulatory Visit: Payer: Self-pay

## 2021-01-24 DIAGNOSIS — C4359 Malignant melanoma of other part of trunk: Secondary | ICD-10-CM

## 2021-01-24 DIAGNOSIS — D0511 Intraductal carcinoma in situ of right breast: Secondary | ICD-10-CM

## 2021-01-24 DIAGNOSIS — D469 Myelodysplastic syndrome, unspecified: Secondary | ICD-10-CM

## 2021-01-25 ENCOUNTER — Inpatient Hospital Stay: Payer: Medicare Other

## 2021-01-25 ENCOUNTER — Other Ambulatory Visit: Payer: Self-pay | Admitting: *Deleted

## 2021-01-25 ENCOUNTER — Other Ambulatory Visit: Payer: Self-pay | Admitting: Oncology

## 2021-01-25 ENCOUNTER — Inpatient Hospital Stay: Payer: Medicare Other | Attending: Oncology

## 2021-01-25 ENCOUNTER — Other Ambulatory Visit: Payer: Self-pay

## 2021-01-25 ENCOUNTER — Telehealth: Payer: Self-pay

## 2021-01-25 VITALS — BP 126/64 | HR 73 | Temp 98.4°F | Resp 16

## 2021-01-25 DIAGNOSIS — D0511 Intraductal carcinoma in situ of right breast: Secondary | ICD-10-CM

## 2021-01-25 DIAGNOSIS — D75839 Thrombocytosis, unspecified: Secondary | ICD-10-CM

## 2021-01-25 DIAGNOSIS — C4359 Malignant melanoma of other part of trunk: Secondary | ICD-10-CM

## 2021-01-25 DIAGNOSIS — D46Z Other myelodysplastic syndromes: Secondary | ICD-10-CM | POA: Diagnosis not present

## 2021-01-25 DIAGNOSIS — D649 Anemia, unspecified: Secondary | ICD-10-CM

## 2021-01-25 DIAGNOSIS — D72829 Elevated white blood cell count, unspecified: Secondary | ICD-10-CM

## 2021-01-25 DIAGNOSIS — I428 Other cardiomyopathies: Secondary | ICD-10-CM

## 2021-01-25 DIAGNOSIS — D469 Myelodysplastic syndrome, unspecified: Secondary | ICD-10-CM

## 2021-01-25 LAB — CBC WITH DIFFERENTIAL (CANCER CENTER ONLY)
Abs Immature Granulocytes: 18.24 10*3/uL — ABNORMAL HIGH (ref 0.00–0.07)
Basophils Absolute: 0.1 10*3/uL (ref 0.0–0.1)
Basophils Relative: 0 %
Eosinophils Absolute: 0 10*3/uL (ref 0.0–0.5)
Eosinophils Relative: 0 %
HCT: 20.6 % — ABNORMAL LOW (ref 36.0–46.0)
Hemoglobin: 7.1 g/dL — ABNORMAL LOW (ref 12.0–15.0)
Immature Granulocytes: 23 %
Lymphocytes Relative: 4 %
Lymphs Abs: 3.3 10*3/uL (ref 0.7–4.0)
MCH: 37.8 pg — ABNORMAL HIGH (ref 26.0–34.0)
MCHC: 34.5 g/dL (ref 30.0–36.0)
MCV: 109.6 fL — ABNORMAL HIGH (ref 80.0–100.0)
Monocytes Absolute: 1 10*3/uL (ref 0.1–1.0)
Monocytes Relative: 1 %
Neutro Abs: 55.8 10*3/uL — ABNORMAL HIGH (ref 1.7–7.7)
Neutrophils Relative %: 72 %
Platelet Count: 150 10*3/uL (ref 150–400)
RBC: 1.88 MIL/uL — ABNORMAL LOW (ref 3.87–5.11)
RDW: 23.8 % — ABNORMAL HIGH (ref 11.5–15.5)
WBC Count: 78.4 10*3/uL (ref 4.0–10.5)
nRBC: 0 % (ref 0.0–0.2)

## 2021-01-25 LAB — FERRITIN: Ferritin: 455 ng/mL — ABNORMAL HIGH (ref 11–307)

## 2021-01-25 LAB — SAVE SMEAR(SSMR), FOR PROVIDER SLIDE REVIEW

## 2021-01-25 LAB — IRON AND TIBC
Iron: 200 ug/dL — ABNORMAL HIGH (ref 41–142)
Saturation Ratios: 65 % — ABNORMAL HIGH (ref 21–57)
TIBC: 309 ug/dL (ref 236–444)
UIBC: 109 ug/dL — ABNORMAL LOW (ref 120–384)

## 2021-01-25 LAB — RETICULOCYTES
Immature Retic Fract: 19.9 % — ABNORMAL HIGH (ref 2.3–15.9)
RBC.: 1.88 MIL/uL — ABNORMAL LOW (ref 3.87–5.11)
Retic Count, Absolute: 27.6 10*3/uL (ref 19.0–186.0)
Retic Ct Pct: 1.5 % (ref 0.4–3.1)

## 2021-01-25 MED ORDER — EPOETIN ALFA-EPBX 40000 UNIT/ML IJ SOLN
40000.0000 [IU] | Freq: Once | INTRAMUSCULAR | Status: AC
  Administered 2021-01-25: 40000 [IU] via SUBCUTANEOUS
  Filled 2021-01-25: qty 1

## 2021-01-25 NOTE — Progress Notes (Signed)
Noted heme of 7.1- scheduled for transfusion

## 2021-01-25 NOTE — Telephone Encounter (Signed)
CRITICAL VALUE STICKER  CRITICAL VALUE: WBC = 78.4  RECEIVER (on-site recipient of call): Yetta Glassman, [place] NOTIFIED: 01/25/21 at 11:30am  MESSENGER (representative from lab): Pam  MD NOTIFIED: Magrinat  TIME OF NOTIFICATION: 01/25/21 at 11:31am  RESPONSE: Notification provided to Bethann Punches., RN for follow-up with pt and provider.

## 2021-01-26 ENCOUNTER — Inpatient Hospital Stay: Payer: Medicare Other

## 2021-01-26 ENCOUNTER — Other Ambulatory Visit: Payer: Self-pay | Admitting: Oncology

## 2021-01-26 DIAGNOSIS — D469 Myelodysplastic syndrome, unspecified: Secondary | ICD-10-CM

## 2021-01-26 DIAGNOSIS — D46Z Other myelodysplastic syndromes: Secondary | ICD-10-CM | POA: Diagnosis not present

## 2021-01-26 DIAGNOSIS — D649 Anemia, unspecified: Secondary | ICD-10-CM

## 2021-01-26 LAB — ABO/RH: ABO/RH(D): AB POS

## 2021-01-26 LAB — PREPARE RBC (CROSSMATCH)

## 2021-01-26 MED ORDER — DIPHENHYDRAMINE HCL 25 MG PO CAPS
25.0000 mg | ORAL_CAPSULE | Freq: Once | ORAL | Status: AC
  Administered 2021-01-26: 12:00:00 25 mg via ORAL
  Filled 2021-01-26: qty 1

## 2021-01-26 MED ORDER — ACETAMINOPHEN 325 MG PO TABS
650.0000 mg | ORAL_TABLET | Freq: Once | ORAL | Status: AC
  Administered 2021-01-26: 12:00:00 650 mg via ORAL
  Filled 2021-01-26: qty 2

## 2021-01-26 MED ORDER — SODIUM CHLORIDE 0.9% IV SOLUTION
250.0000 mL | Freq: Once | INTRAVENOUS | Status: AC
  Administered 2021-01-26: 12:00:00 250 mL via INTRAVENOUS

## 2021-01-26 NOTE — Patient Instructions (Signed)
Blood Transfusion, Adult, Care After This sheet gives you information about how to care for yourself after your procedure. Your doctor may also give you more specific instructions. If you have problems or questions, contact your doctor. What can I expect after the procedure? After the procedure, it is common to have: Bruising and soreness at the IV site. A headache. Follow these instructions at home: Insertion site care   Follow instructions from your doctor about how to take care of your insertion site. This is where an IV tube was put into your vein. Make sure you: Wash your hands with soap and water before and after you change your bandage (dressing). If you cannot use soap and water, use hand sanitizer. Change your bandage as told by your doctor. Check your insertion site every day for signs of infection. Check for: Redness, swelling, or pain. Bleeding from the site. Warmth. Pus or a bad smell. General instructions Take over-the-counter and prescription medicines only as told by your doctor. Rest as told by your doctor. Go back to your normal activities as told by your doctor. Keep all follow-up visits as told by your doctor. This is important. Contact a doctor if: You have itching or red, swollen areas of skin (hives). You feel worried or nervous (anxious). You feel weak after doing your normal activities. You have redness, swelling, warmth, or pain around the insertion site. You have blood coming from the insertion site, and the blood does not stop with pressure. You have pus or a bad smell coming from the insertion site. Get help right away if: You have signs of a serious reaction. This may be coming from an allergy or the body's defense system (immune system). Signs include: Trouble breathing or shortness of breath. Swelling of the face or feeling warm (flushed). Fever or chills. Head, chest, or back pain. Dark pee (urine) or blood in the pee. Widespread rash. Fast  heartbeat. Feeling dizzy or light-headed. You may receive your blood transfusion in an outpatient setting. If so, you will be told whom to contact to report any reactions. These symptoms may be an emergency. Do not wait to see if the symptoms will go away. Get medical help right away. Call your local emergency services (911 in the [place] Do not drive yourself to the hospital. Summary Bruising and soreness at the IV site are common. Check your insertion site every day for signs of infection. Rest as told by your doctor. Go back to your normal activities as told by your doctor. Get help right away if you have signs of a serious reaction. This information is not intended to replace advice given to you by your health care provider. Make sure you discuss any questions you have with your health care provider. Document Revised: 05/27/2020 Document Reviewed: 07/25/2018 Elsevier Patient Education  2022 Elsevier Inc.  

## 2021-01-27 ENCOUNTER — Encounter: Payer: Self-pay | Admitting: Oncology

## 2021-01-27 LAB — TYPE AND SCREEN
ABO/RH(D): AB POS
Antibody Screen: NEGATIVE
Unit division: 0
Unit division: 0

## 2021-01-27 LAB — BPAM RBC
Blood Product Expiration Date: 202212312359
Blood Product Expiration Date: 202301012359
ISSUE DATE / TIME: 202212141306
ISSUE DATE / TIME: 202212141306
Unit Type and Rh: 6200
Unit Type and Rh: 6200

## 2021-02-03 ENCOUNTER — Other Ambulatory Visit: Payer: Self-pay

## 2021-02-03 ENCOUNTER — Inpatient Hospital Stay: Payer: Medicare Other

## 2021-02-03 ENCOUNTER — Other Ambulatory Visit: Payer: Self-pay | Admitting: *Deleted

## 2021-02-03 DIAGNOSIS — D46Z Other myelodysplastic syndromes: Secondary | ICD-10-CM | POA: Diagnosis not present

## 2021-02-03 DIAGNOSIS — I428 Other cardiomyopathies: Secondary | ICD-10-CM

## 2021-02-03 DIAGNOSIS — D471 Chronic myeloproliferative disease: Secondary | ICD-10-CM

## 2021-02-03 DIAGNOSIS — C4359 Malignant melanoma of other part of trunk: Secondary | ICD-10-CM

## 2021-02-03 DIAGNOSIS — D75839 Thrombocytosis, unspecified: Secondary | ICD-10-CM

## 2021-02-03 DIAGNOSIS — D72829 Elevated white blood cell count, unspecified: Secondary | ICD-10-CM

## 2021-02-03 DIAGNOSIS — D469 Myelodysplastic syndrome, unspecified: Secondary | ICD-10-CM

## 2021-02-03 LAB — CBC WITH DIFFERENTIAL (CANCER CENTER ONLY)
Abs Immature Granulocytes: 6.77 10*3/uL — ABNORMAL HIGH (ref 0.00–0.07)
Basophils Absolute: 0.1 10*3/uL (ref 0.0–0.1)
Basophils Relative: 0 %
Eosinophils Absolute: 0 10*3/uL (ref 0.0–0.5)
Eosinophils Relative: 0 %
HCT: 28.9 % — ABNORMAL LOW (ref 36.0–46.0)
Hemoglobin: 9.7 g/dL — ABNORMAL LOW (ref 12.0–15.0)
Immature Granulocytes: 13 %
Lymphocytes Relative: 4 %
Lymphs Abs: 2.2 10*3/uL (ref 0.7–4.0)
MCH: 35.3 pg — ABNORMAL HIGH (ref 26.0–34.0)
MCHC: 33.6 g/dL (ref 30.0–36.0)
MCV: 105.1 fL — ABNORMAL HIGH (ref 80.0–100.0)
Monocytes Absolute: 0.3 10*3/uL (ref 0.1–1.0)
Monocytes Relative: 1 %
Neutro Abs: 43.5 10*3/uL — ABNORMAL HIGH (ref 1.7–7.7)
Neutrophils Relative %: 82 %
Platelet Count: 107 10*3/uL — ABNORMAL LOW (ref 150–400)
RBC: 2.75 MIL/uL — ABNORMAL LOW (ref 3.87–5.11)
RDW: 21.8 % — ABNORMAL HIGH (ref 11.5–15.5)
Smear Review: NORMAL
WBC Count: 52.9 10*3/uL (ref 4.0–10.5)
nRBC: 0 % (ref 0.0–0.2)

## 2021-02-03 LAB — SAMPLE TO BLOOD BANK

## 2021-02-03 LAB — SAVE SMEAR(SSMR), FOR PROVIDER SLIDE REVIEW

## 2021-02-03 LAB — RETICULOCYTES
Immature Retic Fract: 23.5 % — ABNORMAL HIGH (ref 2.3–15.9)
RBC.: 2.77 MIL/uL — ABNORMAL LOW (ref 3.87–5.11)
Retic Count, Absolute: 42.9 10*3/uL (ref 19.0–186.0)
Retic Ct Pct: 1.6 % (ref 0.4–3.1)

## 2021-02-10 DIAGNOSIS — Z6823 Body mass index (BMI) 23.0-23.9, adult: Secondary | ICD-10-CM | POA: Diagnosis not present

## 2021-02-10 DIAGNOSIS — R059 Cough, unspecified: Secondary | ICD-10-CM | POA: Diagnosis not present

## 2021-02-10 DIAGNOSIS — J029 Acute pharyngitis, unspecified: Secondary | ICD-10-CM | POA: Diagnosis not present

## 2021-02-13 ENCOUNTER — Encounter: Payer: Self-pay | Admitting: Oncology

## 2021-02-16 ENCOUNTER — Other Ambulatory Visit: Payer: Self-pay

## 2021-02-16 ENCOUNTER — Encounter: Payer: Self-pay | Admitting: Oncology

## 2021-02-16 DIAGNOSIS — D471 Chronic myeloproliferative disease: Secondary | ICD-10-CM

## 2021-02-17 ENCOUNTER — Inpatient Hospital Stay: Payer: Medicare Other | Admitting: Hematology and Oncology

## 2021-02-17 ENCOUNTER — Inpatient Hospital Stay: Payer: Medicare Other

## 2021-02-17 ENCOUNTER — Ambulatory Visit: Payer: Medicare Other | Admitting: Hematology and Oncology

## 2021-02-17 ENCOUNTER — Other Ambulatory Visit: Payer: Medicare Other

## 2021-02-17 ENCOUNTER — Other Ambulatory Visit: Payer: Self-pay

## 2021-02-17 ENCOUNTER — Ambulatory Visit: Payer: Medicare Other

## 2021-02-17 ENCOUNTER — Encounter: Payer: Self-pay | Admitting: Hematology and Oncology

## 2021-02-17 ENCOUNTER — Other Ambulatory Visit: Payer: Self-pay | Admitting: *Deleted

## 2021-02-17 ENCOUNTER — Inpatient Hospital Stay: Payer: Medicare Other | Attending: Oncology

## 2021-02-17 VITALS — BP 119/60 | HR 79 | Temp 97.9°F | Resp 16 | Ht 61.0 in | Wt 133.5 lb

## 2021-02-17 DIAGNOSIS — D72829 Elevated white blood cell count, unspecified: Secondary | ICD-10-CM

## 2021-02-17 DIAGNOSIS — D471 Chronic myeloproliferative disease: Secondary | ICD-10-CM

## 2021-02-17 DIAGNOSIS — R600 Localized edema: Secondary | ICD-10-CM | POA: Diagnosis not present

## 2021-02-17 DIAGNOSIS — D649 Anemia, unspecified: Secondary | ICD-10-CM | POA: Diagnosis not present

## 2021-02-17 DIAGNOSIS — D469 Myelodysplastic syndrome, unspecified: Secondary | ICD-10-CM

## 2021-02-17 DIAGNOSIS — D46Z Other myelodysplastic syndromes: Secondary | ICD-10-CM | POA: Diagnosis not present

## 2021-02-17 DIAGNOSIS — D619 Aplastic anemia, unspecified: Secondary | ICD-10-CM

## 2021-02-17 DIAGNOSIS — D0511 Intraductal carcinoma in situ of right breast: Secondary | ICD-10-CM

## 2021-02-17 LAB — CBC WITH DIFFERENTIAL (CANCER CENTER ONLY)
Abs Immature Granulocytes: 5.96 10*3/uL — ABNORMAL HIGH (ref 0.00–0.07)
Basophils Absolute: 0.1 10*3/uL (ref 0.0–0.1)
Basophils Relative: 0 %
Eosinophils Absolute: 0 10*3/uL (ref 0.0–0.5)
Eosinophils Relative: 0 %
HCT: 24.8 % — ABNORMAL LOW (ref 36.0–46.0)
Hemoglobin: 8.3 g/dL — ABNORMAL LOW (ref 12.0–15.0)
Immature Granulocytes: 13 %
Lymphocytes Relative: 6 %
Lymphs Abs: 2.5 10*3/uL (ref 0.7–4.0)
MCH: 35.6 pg — ABNORMAL HIGH (ref 26.0–34.0)
MCHC: 33.5 g/dL (ref 30.0–36.0)
MCV: 106.4 fL — ABNORMAL HIGH (ref 80.0–100.0)
Monocytes Absolute: 0.3 10*3/uL (ref 0.1–1.0)
Monocytes Relative: 1 %
Neutro Abs: 35.4 10*3/uL — ABNORMAL HIGH (ref 1.7–7.7)
Neutrophils Relative %: 80 %
Platelet Count: 137 10*3/uL — ABNORMAL LOW (ref 150–400)
RBC: 2.33 MIL/uL — ABNORMAL LOW (ref 3.87–5.11)
RDW: 22.6 % — ABNORMAL HIGH (ref 11.5–15.5)
Smear Review: NORMAL
WBC Count: 44.4 10*3/uL — ABNORMAL HIGH (ref 4.0–10.5)
nRBC: 0 % (ref 0.0–0.2)

## 2021-02-17 LAB — CMP (CANCER CENTER ONLY)
ALT: 29 U/L (ref 0–44)
AST: 22 U/L (ref 15–41)
Albumin: 3.5 g/dL (ref 3.5–5.0)
Alkaline Phosphatase: 113 U/L (ref 38–126)
Anion gap: 7 (ref 5–15)
BUN: 20 mg/dL (ref 8–23)
CO2: 26 mmol/L (ref 22–32)
Calcium: 9.1 mg/dL (ref 8.9–10.3)
Chloride: 106 mmol/L (ref 98–111)
Creatinine: 0.71 mg/dL (ref 0.44–1.00)
GFR, Estimated: >60 mL/min (ref >60)
Glucose, Bld: 143 mg/dL — ABNORMAL HIGH (ref 70–99)
Potassium: 4.3 mmol/L (ref 3.5–5.1)
Sodium: 139 mmol/L (ref 135–145)
Total Bilirubin: 0.7 mg/dL (ref 0.3–1.2)
Total Protein: 6.4 g/dL — ABNORMAL LOW (ref 6.5–8.1)

## 2021-02-17 LAB — LACTATE DEHYDROGENASE: LDH: 232 U/L — ABNORMAL HIGH (ref 98–192)

## 2021-02-17 LAB — SAMPLE TO BLOOD BANK

## 2021-02-17 LAB — PREPARE RBC (CROSSMATCH)

## 2021-02-17 MED ORDER — HYDROCHLOROTHIAZIDE 12.5 MG PO TABS: 12.5000 mg | ORAL_TABLET | Freq: Every day | ORAL | 1 refills | Status: DC

## 2021-02-17 MED ORDER — EPOETIN ALFA-EPBX 40000 UNIT/ML IJ SOLN
40000.0000 [IU] | Freq: Once | INTRAMUSCULAR | Status: AC
  Administered 2021-02-17: 40000 [IU] via SUBCUTANEOUS
  Filled 2021-02-17: qty 1

## 2021-02-17 MED ORDER — SODIUM CHLORIDE 0.9% IV SOLUTION
250.0000 mL | Freq: Once | INTRAVENOUS | Status: AC
  Administered 2021-02-17: 250 mL via INTRAVENOUS

## 2021-02-17 MED ORDER — DIPHENHYDRAMINE HCL 25 MG PO CAPS
25.0000 mg | ORAL_CAPSULE | Freq: Once | ORAL | Status: AC
  Administered 2021-02-17: 25 mg via ORAL
  Filled 2021-02-17: qty 1

## 2021-02-17 MED ORDER — ACETAMINOPHEN 325 MG PO TABS
650.0000 mg | ORAL_TABLET | Freq: Once | ORAL | Status: AC
  Administered 2021-02-17: 650 mg via ORAL
  Filled 2021-02-17: qty 2

## 2021-02-17 NOTE — Progress Notes (Signed)
Scheduled for 1 unit blood

## 2021-02-17 NOTE — Patient Instructions (Signed)

## 2021-02-17 NOTE — Progress Notes (Signed)
ID: Tanya Mitchell OB: 02/26/1945  MR#: [id number]  CSN#:[id number]  Patient Care Team: Leighton Ruff, MD (Inactive) as PCP - General (Family Medicine) Burtis Junes, NP (Inactive) as Nurse Practitioner (Nurse Practitioner) Magrinat, Virgie Dad, MD as Consulting Physician (Oncology) Bobbye Charleston, MD as Consulting Physician (Obstetrics and Gynecology) Janyth Contes, MD as Referring Physician (Hematology and Oncology) OTHER MD:   CHIEF COMPLAINTS:   1)  Hx Left Breast Cancer 1995 (s/p left mastectomy)       2) Hx Right Breast Cancer 2007                   3)  Hx Melanoma, Stage IIIA       4) MPN with leukocytosis/thrombocytosis      CURRENT TREATMENT: hydrea; aranesp; allopurinol   INTERVAL HISTORY: Tanya Mitchell returns today for follow-up of her leukocytosis.  She also has a remote history of breast cancer, for which she continues under observation.  Since last visit, she has been taking her Hydrea every other day and she has been getting Retacrit every 21 days..  From Dr. Tamela Gammon note, continue current therapy and consider darbepoetin every 2 weeks for an additional 3 months and consider transfusion if hemoglobin is 8 g/dL with 2 units and 1 unit if hemoglobin is less than 9 g/dL.  She complains of feeling tired, otherwise no complaints. She wonders if this could be from her anemia. She also reports BLE. When she was here for her last visit, Dr Jana Hakim held HCTZ because she was hypotensive. No other complaints. She is going [place] a [place] mid Jan and will return in February.  REVIEW OF SYSTEMS: A detailed review of systems was otherwise stable   COVID 19 VACCINATION STATUS: [place] x2, most recently 04/2019; had COVID May 2022   HISTORY OF PRESENT ILLNESS: From the earlier summary:  Tanya Mitchell's history of breast cancer dates back to June of 1995, when she had left modified radical mastectomy under [facility] for a pT1c pN1, stage IIA invasive ductal carcinoma, grade  2, estrogen receptor 97% positive, progesterone receptor 45% positive, treated according to CALGB 9394, sequential arm (doxorubicin x3, fourth dose apparently omitted, followed by high-dose cyclophosphamide x3), followed by tamoxifen for 5 years. There has been no evidence of disease recurrence.  Further, in October of 2007 she underwent right lumpectomy for a ductal carcinoma in situ, high-grade, estrogen and progesterone receptor negative, followed by adjuvant radiation.  In July of 2013 the patient was noted to have an irregular mole in her left upper back. It is not clear to me whether this might be related to her prior left breast irradiation. Shave biopsy of this area 09/15/2011 by Dr. Derrel Nip [phone]) showed a superficial spreading malignant melanoma, with Breslow depth 1.45 mm, Clark's level IV. There was brisk host response and 8 mitoses per high power field were noted. There was focal regression but no definitive ulceration. There was no satellitosis. There was no vascular and urgent. Margins were positive, but cleared by wide excision under Dr Stark Klein 10/10/2011. There was some residual malignant melanoma in situ but margins to 2 cm were obtained, with advancement flap closure of the skin defect. In addition left axillary sentinel lymph node mapping was performed showing metastatic melanoma in the single lymph node removed.  Tanya Mitchell sought a second opinion at [facility] under Dr Joaquim Lai Collichio. There was a full discussion regarding completion nodal resection, use of adjuvant interferon, and BRAF testing with a view to participation in a  research protocol then available. The patient considered all these options and after much discussion the decision was made not to pursue full nodal dissection, partly because the area in question had a ready undergone surgery and radiation. The patient opted against interferon adjuvant therapy because of its very marginal benefit and significant side effects.  She declined consideration of a research study and BRAF testing was not performed.  Her subsequent history is as detailed below.   PAST MEDICAL HISTORY: Past Medical History:  Diagnosis Date   Breast cancer ([place]) 1995 and 2007   Cardiomyopathy secondary to chemotherapy [facility])    has improved    Hyperlipidemia    Hypertension    under control, has been on med. x 3 yrs.   Melanoma in situ of back [facility]) 09/2011   left   Personal history of chemotherapy    Personal history of radiation therapy    Skin cancer 2013   Melanoma    PAST SURGICAL HISTORY: Past Surgical History:  Procedure Laterality Date   BREAST LUMPECTOMY Right 12/21/2005   right   MASTECTOMY Left 1995   with breast reconstruction - left    FAMILY HISTORY Family History  Problem Relation Age of Onset   Lung cancer Father    Cancer Father        lung   Autism Son    Breast cancer Daughter    the patient's father died from complications of lung cancer at the age of 39. He was a heavy smoker. The patient's mother died at the age of 80. Tanya Mitchell had no siblings. She underwent genetic testing for breast and ovarian cancer panel April of 2014. There were no demonstrable mutations in the BRCA or the other genes in the panel   GYNECOLOGIC HISTORY:  Menarche age 21, first live birth age 69. She is GX P2. She entered menopause at age 83, when she received her chemotherapy. She did not use hormone replacement. She did use birth control remotely, for approximately 14 years, with no complications.   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  They recently me moved to a new home in the [facility] area.  Tanya Mitchell's son Tanya Mitchell is handicapped, and works as an Training and development officer. [name] is Corporate treasurer limited. Adreanna has 3 grandchildren and [place] 2. Reighn grew up in an Database administrator denomination   ADVANCED DIRECTIVES: In place   HEALTH MAINTENANCE:  Social History   Tobacco Use   Smoking status: Former    Types: Cigarettes    Quit date: 02/14/1964    Years since quitting: 57.0   Smokeless tobacco: Never  Vaping Use   Vaping Use: Never used  Substance Use Topics   Alcohol use: Yes    Comment: daily glass of wine   Drug use: No     Colonoscopy: 2011  PAP: November 2014  Bone density: January 2013, osteopenia  Lipid panel:   August 2013/Dr. Drema [place]   No Known Allergies  Current Outpatient Medications  Medication Sig Dispense Refill   hydrochlorothiazide (HYDRODIURIL) 12.5 MG tablet Take 1 tablet (12.5 mg total) by mouth daily. 30 tablet 1   allopurinol (ZYLOPRIM) 300 MG tablet Take 1 tablet (300 mg total) by mouth daily. 60 tablet 6   atorvastatin (  LIPITOR) 80 MG tablet Take 1 tablet (80 mg total) by mouth daily. 90 tablet 2   carvedilol (COREG) 25 MG tablet TAKE ONE TABLET BY MOUTH TWO TIMES DAILY. GENERIC EQUIVALENT FOR COREG 180 tablet 3   cholecalciferol (VITAMIN D) 1000 UNITS tablet Take 2,000 Units by mouth daily.      glucosamine-chondroitin 500-400 MG tablet Take 1 tablet by mouth daily.     hydroxyurea (HYDREA) 500 MG capsule Take 1 capsule (500 mg total) by mouth daily. May take with food to minimize GI side effects.     metFORMIN (GLUCOPHAGE) 500 MG tablet 250 mg twice a day.     ramipril (ALTACE) 5 MG capsule Take 1 capsule (5 mg total) by mouth daily. 30 capsule 3   No current facility-administered medications for this visit.    OBJECTIVE: White woman who appears younger than stated age  24:   02/17/21 1149  BP: 119/60  Pulse: 79  Resp: 16  Temp: 97.9 F (36.6 C)  SpO2: 100%      Body mass index is 25.22 kg/m.    ECOG FS:1 - Symptomatic but completely ambulatory Filed Weights   02/17/21 1149  Weight: 133 lb 8  oz (60.6 kg)    Sclerae unicteric, EOMs intact Wearing a mask No cervical or supraclavicular adenopathy Lungs no rales or rhonchi Heart regular rate and rhythm Abd soft, nontender, positive bowel sounds MSK no focal spinal tenderness, no upper extremity lymphedema Neuro: nonfocal, well oriented, appropriate affect Breasts: deferred   LAB RESULTS:   Lab Results  Component Value Date   WBC 44.4 (H) 02/17/2021   NEUTROABS 35.4 (H) 02/17/2021   HGB 8.3 (L) 02/17/2021   HCT 24.8 (L) 02/17/2021   MCV 106.4 (H) 02/17/2021   PLT 137 (L) 02/17/2021      Chemistry      Component Value Date/Time   NA 139 02/17/2021 1124   NA 136 01/28/2020 1315   NA 139 11/30/2015 1225   K 4.3 02/17/2021 1124   K 4.6 11/30/2015 1225   CL 106 02/17/2021 1124   CL 104 01/18/2012 0948   CO2 26 02/17/2021 1124   CO2 27 11/30/2015 1225   BUN 20 02/17/2021 1124   BUN 11 01/28/2020 1315   BUN 13.1 11/30/2015 1225   CREATININE 0.71 02/17/2021 1124   CREATININE 0.7 11/30/2015 1225      Component Value Date/Time   CALCIUM 9.1 02/17/2021 1124   CALCIUM 10.0 11/30/2015 1225   ALKPHOS 113 02/17/2021 1124   ALKPHOS 83 11/30/2015 1225   AST 22 02/17/2021 1124   AST 25 11/30/2015 1225   ALT 29 02/17/2021 1124   ALT 27 11/30/2015 1225   BILITOT 0.7 02/17/2021 1124   BILITOT 0.60 11/30/2015 1225      STUDIES: No results found.   ASSESSMENT: 76 y.o. BRCA negative [place] woman   (1) status post left mastectomy with TRAM reconstruction June of 1995 for a T1c N1, stage IIA invasive ductal carcinoma, grade 2, estrogen receptor 97% positive, progesterone receptor 45% positive, treated adjuvantly according to CALGB 9394 with 3 cycles of doxorubicin followed by 3 cycles of cyclophosphamide, then tamoxifen for 5 years, off therapy completed November of 2000  (2) status post right lumpectomy October 2007 for high-grade ductal carcinoma in situ, with negative margins estrogen and progesterone receptor  negative, followed by adjuvant radiation therapy   (3) malignant melanoma, T2a N1a = stage IIIA, as follows:  (a) status post shave biopsy from the right upper  back 09/15/2011 for a superficial spreading melanoma, Breslow depth 1.45 mm, Clark's level IV, with focal regression but no ulceration.  (b) status post wide excision and sentinel lymph node sampling 10/10/2011 with residual malignant melanoma in situ, but negative margins; the single sentinel lymph node was involved by melanoma with the largest subcapsular deposit measuring 0.22 mm, no evidence of capsular involvement or extracapsular extension  (c) completion nodal dissection was discussed, but not performed in part because the patient had already had a complete left axillary lymph node dissection at the time of her 1995 left mastectomy (10 lymph nodes were removed at that time)  (d) adjuvant interferon was discussed with the patient when she visited [facility] in September 2013, but given that it side effects and marginal benefits the patient declined  (e) BRAF testing has not been done on the original tumor; this was also discussed with the patient at the time of her [facility] visit, as it might possibly lead to participation in a research protocol; the patient decided not to pursue that option  (4) PET scan 11/19/18/2015 shows nonspecific uptake only at T6.   (a) MRI of the thoracic spine November 2015 showed no evidence of metastatic disease  (b) repeat PET scan 12/02/2014 shows no residual or recurrent hypermetabolic tumor.  (5) thrombocytosis first noted October 2017, leukocytosis first noted November 2019  (a) bone marrow biopsy 04/03/2018 showed a hypercellular bone marrow with granulocytic and megakaryocytic proliferation, some of the megakaryocytes being small and/or hypolobulated.  There was no increase in blasts, no significant increase in reticulin fibers  (b) cytogenetics from bone marrow biopsy 04/03/2018 showed 46,XX[20].nuc ish (ABL1,  BCR)x2  (c) molecular studies 12/18/2017 showed no mutations in JAK2 exons 12 and 14 (including V 617);  MPL exon 10 or CALR exon 9  (d) BCR/ABL 1 drawn 12/18/2017 showed 94% normal nuclei (6% with single fusion)  (e) repeat BCR/ABL 1 on 04/30/2018 showed 100% normal nuclei  (f) repeat BCR/ABL 1 on 02/27/2019: Translocation not detected  (6) bone marrow biopsy on 10/31/2019 shows a hypercellular marrow with myeloid and megakaryocytic hyperplasia but no increase in blasts.  (a) cytogenetics showed no metaphase cells for analysis  (b) Next generation sequencing for myeloid disorders reveals mutations of  ASXL1, GATA2, and U2AF1. These findings support the impression of a  myeloid neoplasm.    (c) hydroxyurea 500 mg daily started March 2022 with Aranesp support every 4 weeks  (d) hydroxyurea held on 10/25/2020 secondary to progressive anemia, requiring transfusion; EPO increased to every 2 weeks  (e) hydroxyurea resumed at 500 mg every other day as of 11/09/2020 because of a rapid rise in the white cell count, erythropoietin continued, allopurinol added.   PLAN:  Patient is here for follow-up on Hydrea and anemia.  She is currently on Retacrit 40,000 units every 21-day dosing.  Since her last visit with Dr. Joan Mayans she was recommended to continue same dose of Hydrea however recommendation was to consider darbepoetin 300 every 2 weeks. And this would come to erythropoietin equivalent of almost 200,000 units every week.  I will continue current dose of erythropoietin and give her an additional dose next week before she goes on her vacation.  When she returns for her next follow-up, we will consider changing it to darbepoetin to the dose recommended by her other hematologist.    Her labs today look well except for the worsening anemia, we will arrange for 1 unit of packed red blood cells since hemoglobin is under 9 g/dL  and since she is clinically symptomatic  I do believe her lower extremity swelling  is likely secondary to lack of diuretic.  I have restarted her back on hydrochlorothiazide 12.5 mg p.o. daily.  She was instructed to check her blood pressure every day and report to [place] before she goes on her vacation.   Total encounter time 30 minutes.Benay Pike, MD   02/17/2021 1:40 PM  Oncology and Hematology [facility] [place], [place] [address] Tel. [phone]  FAX [phone]   *Total Encounter Time as defined by the Centers for Medicare and Medicaid Services includes, in addition to the face-to-face time of a patient visit (documented in the note above) non-face-to-face time: obtaining and reviewing outside history, ordering and reviewing medications, tests or procedures, care coordination (communications with other health care professionals or caregivers) and documentation in the medical record.

## 2021-02-18 DIAGNOSIS — E7800 Pure hypercholesterolemia, unspecified: Secondary | ICD-10-CM | POA: Diagnosis not present

## 2021-02-18 DIAGNOSIS — E1169 Type 2 diabetes mellitus with other specified complication: Secondary | ICD-10-CM | POA: Diagnosis not present

## 2021-02-18 DIAGNOSIS — I10 Essential (primary) hypertension: Secondary | ICD-10-CM | POA: Diagnosis not present

## 2021-02-18 DIAGNOSIS — M810 Age-related osteoporosis without current pathological fracture: Secondary | ICD-10-CM | POA: Diagnosis not present

## 2021-02-18 LAB — TYPE AND SCREEN
ABO/RH(D): AB POS
Antibody Screen: NEGATIVE
Unit division: 0

## 2021-02-18 LAB — BPAM RBC
Blood Product Expiration Date: 202301252359
ISSUE DATE / TIME: 202301051359
Unit Type and Rh: 6200

## 2021-02-22 ENCOUNTER — Other Ambulatory Visit: Payer: Self-pay

## 2021-02-22 ENCOUNTER — Other Ambulatory Visit: Payer: Self-pay | Admitting: *Deleted

## 2021-02-22 DIAGNOSIS — D471 Chronic myeloproliferative disease: Secondary | ICD-10-CM

## 2021-02-22 DIAGNOSIS — D649 Anemia, unspecified: Secondary | ICD-10-CM

## 2021-02-22 DIAGNOSIS — D469 Myelodysplastic syndrome, unspecified: Secondary | ICD-10-CM

## 2021-02-22 DIAGNOSIS — D619 Aplastic anemia, unspecified: Secondary | ICD-10-CM

## 2021-02-24 ENCOUNTER — Other Ambulatory Visit: Payer: Self-pay

## 2021-02-24 ENCOUNTER — Inpatient Hospital Stay: Payer: Medicare Other

## 2021-02-24 VITALS — BP 131/81 | HR 86 | Temp 98.4°F | Resp 18

## 2021-02-24 DIAGNOSIS — D0511 Intraductal carcinoma in situ of right breast: Secondary | ICD-10-CM

## 2021-02-24 DIAGNOSIS — I428 Other cardiomyopathies: Secondary | ICD-10-CM

## 2021-02-24 DIAGNOSIS — D46Z Other myelodysplastic syndromes: Secondary | ICD-10-CM | POA: Diagnosis not present

## 2021-02-24 DIAGNOSIS — C4359 Malignant melanoma of other part of trunk: Secondary | ICD-10-CM

## 2021-02-24 DIAGNOSIS — D649 Anemia, unspecified: Secondary | ICD-10-CM

## 2021-02-24 DIAGNOSIS — D72829 Elevated white blood cell count, unspecified: Secondary | ICD-10-CM

## 2021-02-24 DIAGNOSIS — D469 Myelodysplastic syndrome, unspecified: Secondary | ICD-10-CM

## 2021-02-24 DIAGNOSIS — D75839 Thrombocytosis, unspecified: Secondary | ICD-10-CM

## 2021-02-24 DIAGNOSIS — D471 Chronic myeloproliferative disease: Secondary | ICD-10-CM

## 2021-02-24 LAB — RETICULOCYTES
Immature Retic Fract: 23.3 % — ABNORMAL HIGH (ref 2.3–15.9)
RBC.: 2.82 MIL/uL — ABNORMAL LOW (ref 3.87–5.11)
Retic Count, Absolute: 47.1 10*3/uL (ref 19.0–186.0)
Retic Ct Pct: 1.7 % (ref 0.4–3.1)

## 2021-02-24 LAB — CBC WITH DIFFERENTIAL/PLATELET
Abs Immature Granulocytes: 6.66 10*3/uL — ABNORMAL HIGH (ref 0.00–0.07)
Basophils Absolute: 0 10*3/uL (ref 0.0–0.1)
Basophils Relative: 0 %
Eosinophils Absolute: 0 10*3/uL (ref 0.0–0.5)
Eosinophils Relative: 0 %
HCT: 28.8 % — ABNORMAL LOW (ref 36.0–46.0)
Hemoglobin: 10 g/dL — ABNORMAL LOW (ref 12.0–15.0)
Immature Granulocytes: 14 %
Lymphocytes Relative: 5 %
Lymphs Abs: 2.5 10*3/uL (ref 0.7–4.0)
MCH: 35.3 pg — ABNORMAL HIGH (ref 26.0–34.0)
MCHC: 34.7 g/dL (ref 30.0–36.0)
MCV: 101.8 fL — ABNORMAL HIGH (ref 80.0–100.0)
Monocytes Absolute: 0.2 10*3/uL (ref 0.1–1.0)
Monocytes Relative: 0 %
Neutro Abs: 39.9 10*3/uL — ABNORMAL HIGH (ref 1.7–7.7)
Neutrophils Relative %: 81 %
Platelets: 128 10*3/uL — ABNORMAL LOW (ref 150–400)
RBC: 2.83 MIL/uL — ABNORMAL LOW (ref 3.87–5.11)
RDW: 21.7 % — ABNORMAL HIGH (ref 11.5–15.5)
Smear Review: NORMAL
WBC: 49.3 10*3/uL — ABNORMAL HIGH (ref 4.0–10.5)
nRBC: 0 % (ref 0.0–0.2)

## 2021-02-24 LAB — IRON AND IRON BINDING CAPACITY (CC-WL,HP ONLY)
Iron: 233 ug/dL — ABNORMAL HIGH (ref 28–170)
Saturation Ratios: 69 % — ABNORMAL HIGH (ref 10.4–31.8)
TIBC: 337 ug/dL (ref 250–450)
UIBC: 104 ug/dL — ABNORMAL LOW (ref 148–442)

## 2021-02-24 LAB — FERRITIN: Ferritin: 804 ng/mL — ABNORMAL HIGH (ref 11–307)

## 2021-02-24 LAB — SAVE SMEAR(SSMR), FOR PROVIDER SLIDE REVIEW

## 2021-02-24 MED ORDER — EPOETIN ALFA-EPBX 40000 UNIT/ML IJ SOLN
40000.0000 [IU] | Freq: Once | INTRAMUSCULAR | Status: AC
  Administered 2021-02-24: 40000 [IU] via SUBCUTANEOUS
  Filled 2021-02-24: qty 1

## 2021-03-21 ENCOUNTER — Telehealth: Payer: Self-pay | Admitting: Cardiology

## 2021-03-21 DIAGNOSIS — I428 Other cardiomyopathies: Secondary | ICD-10-CM

## 2021-03-21 DIAGNOSIS — I493 Ventricular premature depolarization: Secondary | ICD-10-CM | POA: Diagnosis not present

## 2021-03-21 DIAGNOSIS — I440 Atrioventricular block, first degree: Secondary | ICD-10-CM | POA: Diagnosis not present

## 2021-03-21 DIAGNOSIS — Z79899 Other long term (current) drug therapy: Secondary | ICD-10-CM | POA: Diagnosis not present

## 2021-03-21 DIAGNOSIS — D471 Chronic myeloproliferative disease: Secondary | ICD-10-CM | POA: Diagnosis not present

## 2021-03-21 DIAGNOSIS — I5022 Chronic systolic (congestive) heart failure: Secondary | ICD-10-CM

## 2021-03-21 DIAGNOSIS — J9811 Atelectasis: Secondary | ICD-10-CM | POA: Diagnosis not present

## 2021-03-21 DIAGNOSIS — J90 Pleural effusion, not elsewhere classified: Secondary | ICD-10-CM | POA: Diagnosis not present

## 2021-03-21 NOTE — Telephone Encounter (Signed)
Pt is calling to request for Dr. Johney Frame to order an echo for her, at the request of her Hematologist at [facility], Dr Robyne Askew.  Pt states she saw her Hematologist today in clinic.  Pt is followed by Hematology for a blood disorder, myeloproliferative disorder.  Pt states she mentioned to her Hematologist about ongoing complaints of fluid retention and sob.  She states her Hematologist advised for her to reach out to Dr. Johney Frame to see if she would order for the pt to have another echo done, re-evaluate her EF.  She states her Hematologist is not requesting an appt with our office or any other testing, other than an echo to be done.   Pt did have an echo done here by our office on 09/30/20, with results as mentioned below by Dr. Gasper Sells.  Pt is aware that she had this done back in Aug 2022, but is requesting another echo to be done, and she states this is ok if insurance coverage is not provided for repeat testing.   Informed the pt that I will route this message to Dr. Johney Frame to further review and advise on this matter, and our office will follow-up with her accordingly thereafter.  Will cc triage in on this message, incase Dr. Johney Frame advises tomorrow and in my absence.  Pt verbalized understanding and agrees with this plan.    Werner Lean, MD  09/30/2020  5:57 PM EDT     Results: Stable LVEF Plan:  No change   Werner Lean, MD

## 2021-03-21 NOTE — Telephone Encounter (Deleted)
Pt is calling to request for Dr. Johney Frame to order an echo for her, at the request of her Hematologist at [facility], Dr Robyne Askew. Pt states she saw her Hematologist today in clinic.  Pt is followed by Hematology for a rare blood disorder, myeloproliferative disorder.  Pt states she mentioned to her Hematologist about ongoing complaints of fluid retention and sob.  She states her Hematologist advised for her to reach out to Dr. Johney Frame to see if she would order for the pt to have another echo done, re-evaluate her EF.  Pt did have an echo done here by our office on 09/30/20

## 2021-03-21 NOTE — Telephone Encounter (Signed)
We can do a limited echo to assess EF.  We can also check a BNP to assess her fluid levels at well.

## 2021-03-21 NOTE — Telephone Encounter (Signed)
Patient states her hematologist at [facility] Dr. Robyne Askew wanted her to have an echo this week, but there are no orders. She would like to know if Dr. Johney Frame would order the test.

## 2021-03-22 ENCOUNTER — Inpatient Hospital Stay: Payer: Medicare Other

## 2021-03-22 ENCOUNTER — Other Ambulatory Visit: Payer: Self-pay

## 2021-03-22 ENCOUNTER — Encounter: Payer: Self-pay | Admitting: Hematology and Oncology

## 2021-03-22 ENCOUNTER — Inpatient Hospital Stay: Payer: Medicare Other | Admitting: Hematology and Oncology

## 2021-03-22 ENCOUNTER — Inpatient Hospital Stay: Payer: Medicare Other | Attending: Oncology

## 2021-03-22 VITALS — BP 124/66 | HR 84 | Temp 98.4°F | Resp 16 | Ht 61.0 in | Wt 141.8 lb

## 2021-03-22 DIAGNOSIS — D46Z Other myelodysplastic syndromes: Secondary | ICD-10-CM | POA: Diagnosis not present

## 2021-03-22 DIAGNOSIS — D471 Chronic myeloproliferative disease: Secondary | ICD-10-CM

## 2021-03-22 DIAGNOSIS — C439 Malignant melanoma of skin, unspecified: Secondary | ICD-10-CM | POA: Diagnosis not present

## 2021-03-22 DIAGNOSIS — D469 Myelodysplastic syndrome, unspecified: Secondary | ICD-10-CM | POA: Diagnosis not present

## 2021-03-22 DIAGNOSIS — D0511 Intraductal carcinoma in situ of right breast: Secondary | ICD-10-CM

## 2021-03-22 DIAGNOSIS — Z9011 Acquired absence of right breast and nipple: Secondary | ICD-10-CM

## 2021-03-22 DIAGNOSIS — D619 Aplastic anemia, unspecified: Secondary | ICD-10-CM

## 2021-03-22 DIAGNOSIS — D7589 Other specified diseases of blood and blood-forming organs: Secondary | ICD-10-CM | POA: Diagnosis not present

## 2021-03-22 DIAGNOSIS — D75839 Thrombocytosis, unspecified: Secondary | ICD-10-CM

## 2021-03-22 DIAGNOSIS — D72829 Elevated white blood cell count, unspecified: Secondary | ICD-10-CM

## 2021-03-22 DIAGNOSIS — Z9012 Acquired absence of left breast and nipple: Secondary | ICD-10-CM

## 2021-03-22 MED ORDER — EPOETIN ALFA-EPBX 40000 UNIT/ML IJ SOLN
40000.0000 [IU] | Freq: Once | INTRAMUSCULAR | Status: AC
  Administered 2021-03-22: 40000 [IU] via SUBCUTANEOUS
  Filled 2021-03-22: qty 1

## 2021-03-22 MED ORDER — EPOETIN ALFA-EPBX 40000 UNIT/ML IJ SOLN: 40000.0000 [IU] | Freq: Once | INTRAMUSCULAR | Status: DC

## 2021-03-22 NOTE — Progress Notes (Signed)
ID: Keith Rake Brander OB: 1945/10/08  MR#: [id number]  CSN#:[id number]  Patient Care Team: Leighton Ruff, MD (Inactive) as PCP - General (Family Medicine) Burtis Junes, NP (Inactive) as Nurse Practitioner (Nurse Practitioner) Magrinat, Virgie Dad, MD (Inactive) as Consulting Physician (Oncology) Bobbye Charleston, MD as Consulting Physician (Obstetrics and Gynecology) Janyth Contes, MD as Referring Physician (Hematology and Oncology) OTHER MD:   CHIEF COMPLAINTS:   1)  Hx Left Breast Cancer 1995 (s/p left mastectomy)       2) Hx Right Breast Cancer 2007                   3)  Hx Melanoma, Stage IIIA       4) MPN with leukocytosis/thrombocytosis      CURRENT TREATMENT: hydrea; aranesp; allopurinol   INTERVAL HISTORY: Merlin returns today for follow-up of her leukocytosis.  She also has a remote history of breast cancer, for which she continues under observation.  Since last visit, she has followed up with Dr. Joan Mayans at [facility] yesterday.  She continues to take Hydrea every other day and Retacrit prescribed by Dr. Jana Hakim.  She received a unit of packed red blood cell transfusion yesterday.  She tells me that she feels awful, lack of energy, short of breath and has severe fluid retention in bilateral lower extremities.  She is scheduled to see her cardiologist on Tuesday.  She has been taking all her medications as prescribed. No other adverse effects reported with Hydrea.  Rest of the pertinent 10 point ROS reviewed and negative. REVIEW OF SYSTEMS: A detailed review of systems was otherwise stable   COVID 19 VACCINATION STATUS: Hamer x2, most recently 04/2019; had COVID May 2022   HISTORY OF PRESENT ILLNESS: From the earlier summary:  Tahlor's history of breast cancer dates back to June of 1995, when she had left modified radical mastectomy under [facility] for a pT1c pN1, stage IIA invasive ductal carcinoma, grade 2, estrogen receptor 97% positive, progesterone receptor  45% positive, treated according to CALGB 9394, sequential arm (doxorubicin x3, fourth dose apparently omitted, followed by high-dose cyclophosphamide x3), followed by tamoxifen for 5 years. There has been no evidence of disease recurrence.  Further, in October of 2007 she underwent right lumpectomy for a ductal carcinoma in situ, high-grade, estrogen and progesterone receptor negative, followed by adjuvant radiation.  In July of 2013 the patient was noted to have an irregular mole in her left upper back. It is not clear to me whether this might be related to her prior left breast irradiation. Shave biopsy of this area 09/15/2011 by Dr. Derrel Nip [phone]) showed a superficial spreading malignant melanoma, with Breslow depth 1.45 mm, Clark's level IV. There was brisk host response and 8 mitoses per high power field were noted. There was focal regression but no definitive ulceration. There was no satellitosis. There was no vascular and urgent. Margins were positive, but cleared by wide excision under Dr Stark Klein 10/10/2011. There was some residual malignant melanoma in situ but margins to 2 cm were obtained, with advancement flap closure of the skin defect. In addition left axillary sentinel lymph node mapping was performed showing metastatic melanoma in the single lymph node removed.  Hayleigh sought a second opinion at [facility] under Dr Joaquim Lai Collichio. There was a full discussion regarding completion nodal resection, use of adjuvant interferon, and BRAF testing with a view to participation in a research protocol then available. The patient considered all these options and after much discussion  the decision was made not to pursue full nodal dissection, partly because the area in question had a ready undergone surgery and radiation. The patient opted against interferon adjuvant therapy because of its very marginal benefit and significant side effects. She declined consideration of a research study and BRAF  testing was not performed.  Her subsequent history is as detailed below.   PAST MEDICAL HISTORY: Past Medical History:  Diagnosis Date   Breast cancer (Bruno) 1995 and 2007   Cardiomyopathy secondary to chemotherapy [facility])    has improved    Hyperlipidemia    Hypertension    under control, has been on med. x 3 yrs.   Melanoma in situ of back [facility] 09/2011   left   Personal history of chemotherapy    Personal history of radiation therapy    Skin cancer 2013   Melanoma    PAST SURGICAL HISTORY: Past Surgical History:  Procedure Laterality Date   BREAST LUMPECTOMY Right 12/21/2005   right   MASTECTOMY Left 1995   with breast reconstruction - left    FAMILY HISTORY Family History  Problem Relation Age of Onset   Lung cancer Father    Cancer Father        lung   Autism Son    Breast cancer Daughter    the patient's father died from complications of lung cancer at the age of 65. He was a heavy smoker. The patient's mother died at the age of 18. Helma had no siblings. She underwent genetic testing for breast and ovarian cancer panel April of 2014. There were no demonstrable mutations in the BRCA or the other genes in the panel   GYNECOLOGIC HISTORY:  Menarche age 63, first live birth age 85. She is GX P2. She entered menopause at age 54, when she received her chemotherapy. She did not use hormone replacement. She did use birth control remotely, for approximately 14 years, with no complications.   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  They recently me moved to a new home in the [facility] area.  Breeze's son [name] is handicapped, and works as an Training and development officer. [name] is Brewing technologist limited. Reyanna has 3  grandchildren and Helix 2. Kynlei grew up in an Database administrator denomination   ADVANCED DIRECTIVES: In place   HEALTH MAINTENANCE:  Social History   Tobacco Use   Smoking status: Former    Types: Cigarettes    Quit date: 02/14/1964    Years since quitting: 57.1   Smokeless tobacco: Never  Vaping Use   Vaping Use: Never used  Substance Use Topics   Alcohol use: Yes    Comment: daily glass of wine   Drug use: No     Colonoscopy: 2011  PAP: November 2014  Bone density: January 2013, osteopenia  Lipid panel:   August 2013/Dr. Drema Dallas   No Known Allergies  Current Outpatient Medications  Medication Sig Dispense Refill   allopurinol (ZYLOPRIM) 300 MG tablet Take 1 tablet (300 mg total) by mouth daily. 60 tablet 6   atorvastatin (LIPITOR) 80 MG tablet Take 1 tablet (80 mg total) by mouth daily. 90 tablet 2   carvedilol (COREG) 25 MG tablet TAKE ONE TABLET BY MOUTH TWO TIMES DAILY. GENERIC EQUIVALENT  FOR COREG 180 tablet 3   cholecalciferol (VITAMIN D) 1000 UNITS tablet Take 2,000 Units by mouth daily.      glucosamine-chondroitin 500-400 MG tablet Take 1 tablet by mouth daily.     hydrochlorothiazide (HYDRODIURIL) 12.5 MG tablet Take 1 tablet (12.5 mg total) by mouth daily. 30 tablet 1   hydroxyurea (HYDREA) 500 MG capsule Take 500 mg by mouth daily. May take with food to minimize GI side effects. She takes it every other day     metFORMIN (GLUCOPHAGE) 500 MG tablet 250 mg twice a day.     ramipril (ALTACE) 5 MG capsule Take 1 capsule (5 mg total) by mouth daily. 30 capsule 3   No current facility-administered medications for this visit.    OBJECTIVE: White woman who appears younger than stated age  19:   03/22/21 0859  BP: 124/66  Pulse: 84  Resp: 16  Temp: 98.4 F (36.9 C)  SpO2: 100%       Body mass index is 26.79 kg/m.    ECOG FS:1 - Symptomatic but completely ambulatory Filed Weights   03/22/21 0859  Weight: 141 lb 12.8 oz (64.3 kg)     Sclerae  unicteric, EOMs intact Wearing a mask No cervical or supraclavicular adenopathy Lungs no rales or rhonchi, decreased breathing bilateral lower lung fields. Heart regular rate and rhythm Abd soft, nontender, positive bowel sounds MSK no focal spinal tenderness, no upper extremity lymphedema Neuro: nonfocal, well oriented, appropriate affect Breasts: deferred Lower extremities: Bilateral lower extremity pitting edema 1-2+ extending up to her calfs.   LAB RESULTS:   Lab Results  Component Value Date   WBC 49.3 (H) 02/24/2021   NEUTROABS 39.9 (H) 02/24/2021   HGB 10.0 (L) 02/24/2021   HCT 28.8 (L) 02/24/2021   MCV 101.8 (H) 02/24/2021   PLT 128 (L) 02/24/2021      Chemistry      Component Value Date/Time   NA 139 02/17/2021 1124   NA 136 01/28/2020 1315   NA 139 11/30/2015 1225   K 4.3 02/17/2021 1124   K 4.6 11/30/2015 1225   CL 106 02/17/2021 1124   CL 104 01/18/2012 0948   CO2 26 02/17/2021 1124   CO2 27 11/30/2015 1225   BUN 20 02/17/2021 1124   BUN 11 01/28/2020 1315   BUN 13.1 11/30/2015 1225   CREATININE 0.71 02/17/2021 1124   CREATININE 0.7 11/30/2015 1225      Component Value Date/Time   CALCIUM 9.1 02/17/2021 1124   CALCIUM 10.0 11/30/2015 1225   ALKPHOS 113 02/17/2021 1124   ALKPHOS 83 11/30/2015 1225   AST 22 02/17/2021 1124   AST 25 11/30/2015 1225   ALT 29 02/17/2021 1124   ALT 27 11/30/2015 1225   BILITOT 0.7 02/17/2021 1124   BILITOT 0.60 11/30/2015 1225      STUDIES: No results found.   ASSESSMENT: 76 y.o. BRCA negative [place] woman   (1) status post left mastectomy with TRAM reconstruction June of 1995 for a T1c N1, stage IIA invasive ductal carcinoma, grade 2, estrogen receptor 97% positive, progesterone receptor 45% positive, treated adjuvantly according to CALGB 9394 with 3 cycles of doxorubicin followed by 3 cycles of cyclophosphamide, then tamoxifen for 5 years, off therapy completed November of 2000  (2) status post right  lumpectomy October 2007 for high-grade ductal carcinoma in situ, with negative margins estrogen and progesterone receptor negative, followed by adjuvant radiation therapy   (3) malignant melanoma, T2a N1a = stage IIIA, as follows:  (  a) status post shave biopsy from the right upper back 09/15/2011 for a superficial spreading melanoma, Breslow depth 1.45 mm, Clark's level IV, with focal regression but no ulceration.  (b) status post wide excision and sentinel lymph node sampling 10/10/2011 with residual malignant melanoma in situ, but negative margins; the single sentinel lymph node was involved by melanoma with the largest subcapsular deposit measuring 0.22 mm, no evidence of capsular involvement or extracapsular extension  (c) completion nodal dissection was discussed, but not performed in part because the patient had already had a complete left axillary lymph node dissection at the time of her 1995 left mastectomy (10 lymph nodes were removed at that time)  (d) adjuvant interferon was discussed with the patient when she visited [facility] in September 2013, but given that it side effects and marginal benefits the patient declined  (e) BRAF testing has not been done on the original tumor; this was also discussed with the patient at the time of her [facility] visit, as it might possibly lead to participation in a research protocol; the patient decided not to pursue that option  (4) PET scan 11/19/18/2015 shows nonspecific uptake only at T6.   (a) MRI of the thoracic spine November 2015 showed no evidence of metastatic disease  (b) repeat PET scan 12/02/2014 shows no residual or recurrent hypermetabolic tumor.  (5) thrombocytosis first noted October 2017, leukocytosis first noted November 2019  (a) bone marrow biopsy 04/03/2018 showed a hypercellular bone marrow with granulocytic and megakaryocytic proliferation, some of the megakaryocytes being small and/or hypolobulated.  There was no increase in blasts, no  significant increase in reticulin fibers  (b) cytogenetics from bone marrow biopsy 04/03/2018 showed 46,XX[20].nuc ish (ABL1, BCR)x2  (c) molecular studies 12/18/2017 showed no mutations in JAK2 exons 12 and 14 (including V 617);  MPL exon 10 or CALR exon 9  (d) BCR/ABL 1 drawn 12/18/2017 showed 94% normal nuclei (6% with single fusion)  (e) repeat BCR/ABL 1 on 04/30/2018 showed 100% normal nuclei  (f) repeat BCR/ABL 1 on 02/27/2019: Translocation not detected  (6) bone marrow biopsy on 10/31/2019 shows a hypercellular marrow with myeloid and megakaryocytic hyperplasia but no increase in blasts.  (a) cytogenetics showed no metaphase cells for analysis  (b) Next generation sequencing for myeloid disorders reveals mutations of  ASXL1, GATA2, and U2AF1. These findings support the impression of a  myeloid neoplasm.    (c) hydroxyurea 500 mg daily started March 2022 with Aranesp support every 4 weeks  (d) hydroxyurea held on 10/25/2020 secondary to progressive anemia, requiring transfusion; EPO increased to every 2 weeks  (e) hydroxyurea resumed at 500 mg every other day as of 11/09/2020 because of a rapid rise in the white cell count, erythropoietin continued, allopurinol added.   PLAN:  She is currently taking Hydrea and Retacrit as instructed. Patient is here for follow-up on Hydrea and anemia.   According to her hematologist at [facility], transition to Darbopoetin 300 mg every 2 weeks She will get her Retacrit as scheduled today and then I will transition her plan to darbepoetin as recommended by her hematologist.  She already received a unit of packed red blood cells yesterday hence no indication for transfusion. Agree with cardiology follow-up given worsening lower extremity edema and shortness of breath.  Patient mentions that she had a chest x-ray yesterday which was unremarkable per her verbal report  Total encounter time 30 minutes.Benay Pike, MD   03/22/2021 9:19 AM   [facility]  [address] [place], [place] [address] Tel. [place] [phone]   *Total Encounter Time as defined by the Centers for Medicare and Medicaid Services includes, in addition to the face-to-face time of a patient visit (documented in the note above) non-face-to-face time: obtaining and reviewing outside history, ordering and reviewing medications, tests or procedures, care coordination (communications with other health care professionals or caregivers) and documentation in the medical record.

## 2021-03-22 NOTE — Progress Notes (Deleted)
Hgb = 10mg /dl. Will hold retacrit per protocol  Larene Beach, PharmD

## 2021-03-22 NOTE — Progress Notes (Signed)
Pt reports hopelessness and verbalizes she has no happiness in life; no purpose. When asked if pt has thoughts of suicide she states, "I do not want to be committed so I am going to say no." Pt was offered consult with spiritual care and our social worker but refused stating she only wants to speak with me and Mendel Ryder, NP about how she is feeling. Mendel Ryder, NP aware.

## 2021-03-22 NOTE — Telephone Encounter (Signed)
Called pt reviewed MD recommendations.  Pt is agreeable to plan.  Orders placed for limited echo and BNP.  Pt will have echo on 03/29/21 at 7:35 am and lab same day.  Pt had no questions or concerns.

## 2021-03-23 ENCOUNTER — Encounter: Payer: Self-pay | Admitting: Hematology and Oncology

## 2021-03-23 ENCOUNTER — Encounter: Payer: Self-pay | Admitting: Oncology

## 2021-03-23 ENCOUNTER — Ambulatory Visit
Admission: RE | Admit: 2021-03-23 | Discharge: 2021-03-23 | Disposition: A | Discharge status: Home / Self Care | Payer: Medicare Other | Source: Ambulatory Visit | Attending: Family Medicine | Admitting: Family Medicine

## 2021-03-23 DIAGNOSIS — M85851 Other specified disorders of bone density and structure, right thigh: Secondary | ICD-10-CM | POA: Diagnosis not present

## 2021-03-23 DIAGNOSIS — E2839 Other primary ovarian failure: Secondary | ICD-10-CM

## 2021-03-23 DIAGNOSIS — M810 Age-related osteoporosis without current pathological fracture: Secondary | ICD-10-CM | POA: Diagnosis not present

## 2021-03-23 DIAGNOSIS — Z780 Asymptomatic menopausal state: Secondary | ICD-10-CM | POA: Diagnosis not present

## 2021-03-23 NOTE — Addendum Note (Signed)
Addended by: Adaline Sill on: 03/23/2021 08:25 AM   Modules accepted: Orders

## 2021-03-24 DIAGNOSIS — E1169 Type 2 diabetes mellitus with other specified complication: Secondary | ICD-10-CM | POA: Diagnosis not present

## 2021-03-24 DIAGNOSIS — E7800 Pure hypercholesterolemia, unspecified: Secondary | ICD-10-CM | POA: Diagnosis not present

## 2021-03-24 DIAGNOSIS — I10 Essential (primary) hypertension: Secondary | ICD-10-CM | POA: Diagnosis not present

## 2021-03-24 DIAGNOSIS — E2839 Other primary ovarian failure: Secondary | ICD-10-CM | POA: Diagnosis not present

## 2021-03-29 ENCOUNTER — Other Ambulatory Visit: Payer: Medicare Other | Admitting: *Deleted

## 2021-03-29 ENCOUNTER — Ambulatory Visit: Payer: Medicare Other | Admitting: Cardiology

## 2021-03-29 ENCOUNTER — Encounter: Payer: Self-pay | Admitting: Cardiology

## 2021-03-29 ENCOUNTER — Other Ambulatory Visit: Payer: Self-pay

## 2021-03-29 ENCOUNTER — Ambulatory Visit (HOSPITAL_COMMUNITY): Payer: Medicare Other | Attending: Cardiology

## 2021-03-29 VITALS — BP 108/60 | HR 75 | Ht 61.0 in | Wt 138.2 lb

## 2021-03-29 DIAGNOSIS — I428 Other cardiomyopathies: Secondary | ICD-10-CM

## 2021-03-29 DIAGNOSIS — Z79899 Other long term (current) drug therapy: Secondary | ICD-10-CM

## 2021-03-29 DIAGNOSIS — I5022 Chronic systolic (congestive) heart failure: Secondary | ICD-10-CM | POA: Diagnosis not present

## 2021-03-29 DIAGNOSIS — I10 Essential (primary) hypertension: Secondary | ICD-10-CM | POA: Diagnosis not present

## 2021-03-29 DIAGNOSIS — I5023 Acute on chronic systolic (congestive) heart failure: Secondary | ICD-10-CM | POA: Diagnosis not present

## 2021-03-29 DIAGNOSIS — D75839 Thrombocytosis, unspecified: Secondary | ICD-10-CM

## 2021-03-29 DIAGNOSIS — D72829 Elevated white blood cell count, unspecified: Secondary | ICD-10-CM

## 2021-03-29 LAB — ECHOCARDIOGRAM LIMITED
Area-P 1/2: 5.18 cm2
S' Lateral: 4.7 cm

## 2021-03-29 MED ORDER — MAGNESIUM 400 MG PO TABS: ORAL_TABLET | ORAL | 0 refills | Status: DC

## 2021-03-29 MED ORDER — FUROSEMIDE 40 MG PO TABS: ORAL_TABLET | ORAL | 0 refills | Status: DC

## 2021-03-29 MED ORDER — SPIRONOLACTONE 25 MG PO TABS: 25.0000 mg | ORAL_TABLET | Freq: Every day | ORAL | 0 refills | Status: DC

## 2021-03-29 MED ORDER — POTASSIUM CHLORIDE CRYS ER 20 MEQ PO TBCR: EXTENDED_RELEASE_TABLET | ORAL | 0 refills | Status: DC

## 2021-03-29 NOTE — Progress Notes (Signed)
Cardiology Office Note:    Date:  03/29/2021   ID:  Tanya Mitchell, DOB Apr 13, 1945, MRN [id number]  PCP:  Marda Stalker, PA-C   [facility] Providers Cardiologist:  None {  Referring MD: Marda Stalker, PA-C    History of Present Illness:    Tanya Mitchell is a 76 y.o. female with a hx of prior cardiomyopathy secondary to chemo from breast cancer with improved EF, melenoma, HLD and DMII who was previously followed by Truitt Merle, NP who now presents to clinic for follow-up.  Last seen in clinic 01/28/20 where she was doing well.   Last seen in clinic 08/2020 where she was doing well with no anginal or HF symptoms. TTE 09/2020 with LVEF 45-50%, normal RV, normal PASP, severe LAE, trivial MR, no significant aortic disease  Was planned for repeat TTE today where EF found to be 20-25%, normal RV, mild vs mild moderate MR, small pericardial effusion, RAP 15 per my preliminary review.  Now added on as an urgent visit.  Today, the patient states that about 3 years ago she was diagnosed with very high WBC. Multiple bone marrow biopsies unremarkable. And has been on hydroxyurea since 11/2020. She has been doing well until about 2 months ago where she started to feel more fatigued and developed worsening edema and abdominal distension. Has gained 22lbs in 8-9 weeks. No known acute triggers. Denies chest pain or palpitations. No new medications other than hydroxyurea that started in 11/2020. No significant salty meals. No significant life stressors. No known stressors and prior to this event, she was very active without anginal symptoms. Last chemo for breast cancer was in 1996.   Past Medical History:  Diagnosis Date   Breast cancer ([place]) 1995 and 2007   Cardiomyopathy secondary to chemotherapy [facility])    has improved    Hyperlipidemia    Hypertension    under control, has been on med. x 3 yrs.   Melanoma in situ of back [facility]) 09/2011   left   Personal history of  chemotherapy    Personal history of radiation therapy    Skin cancer 2013   Melanoma    Past Surgical History:  Procedure Laterality Date   BREAST LUMPECTOMY Right 12/21/2005   right   MASTECTOMY Left 1995   with breast reconstruction - left    Current Medications: Current Meds  Medication Sig   albuterol (VENTOLIN HFA) 108 (90 Base) MCG/ACT inhaler Inhale 1 puff into the lungs as needed.   allopurinol (ZYLOPRIM) 300 MG tablet Take 1 tablet (300 mg total) by mouth daily.   ascorbic acid (VITAMIN C) 500 MG tablet Take 1 tablet by mouth daily.   atorvastatin (LIPITOR) 80 MG tablet Take 1 tablet (80 mg total) by mouth daily.   cholecalciferol (VITAMIN D) 1000 UNITS tablet Take 2,000 Units by mouth daily.    furosemide (LASIX) 40 MG tablet Take 1 tablet (40 mg total) by mouth twice daily for 5 days only, then decrease to taking 1 tablet (40 mg total) by mouth daily thereafter.   glucosamine-chondroitin 500-400 MG tablet Take 1 tablet by mouth daily.   hydroxyurea (HYDREA) 500 MG capsule Take 500 mg by mouth daily. May take with food to minimize GI side effects. She takes it every other day   Magnesium 400 MG TABS Take 1 tablet (400 mg total) by mouth twice daily for 5 days only, then decrease to taking 1 tablet (400 mg total) by mouth daily thereafter.   metFORMIN (  GLUCOPHAGE) 500 MG tablet 250 mg twice a day.   potassium chloride SA (KLOR-CON M) 20 MEQ tablet Take 1 tablet (20 mEq total) by mouth twice daily for 5 days only, then decrease to taking 1 tablet (20 mEq total) by mouth daily thereafter.   ramipril (ALTACE) 5 MG capsule Take 1 capsule (5 mg total) by mouth daily.   spironolactone (ALDACTONE) 25 MG tablet Take 1 tablet (25 mg total) by mouth daily.   triamcinolone (KENALOG) 0.025 % ointment Apply topically as needed.   [DISCONTINUED] carvedilol (COREG) 25 MG tablet TAKE ONE TABLET BY MOUTH TWO TIMES DAILY. GENERIC EQUIVALENT FOR COREG   [DISCONTINUED] hydrochlorothiazide  (HYDRODIURIL) 12.5 MG tablet Take 1 tablet (12.5 mg total) by mouth daily.     Allergies:   Patient has no known allergies.   Social History   Socioeconomic History   Marital status: Married    Spouse name: Not on file   Number of children: Not on file   Years of education: Not on file   Highest education level: Not on file  Occupational History   Not on file  Tobacco Use   Smoking status: Former    Types: Cigarettes    Quit date: 02/14/1964    Years since quitting: 56.1   Smokeless tobacco: Never  Vaping Use   Vaping Use: Never used  Substance and Sexual Activity   Alcohol use: Yes    Comment: daily glass of wine   Drug use: No   Sexual activity: Yes    Birth control/protection: Post-menopausal  Other Topics Concern   Not on file  Social History Narrative   Not on file   Social Determinants of Health   Financial Resource Strain: Not on file  Food Insecurity: Not on file  Transportation Needs: Not on file  Physical Activity: Not on file  Stress: Not on file  Social Connections: Not on file     Family History: The patient's family history includes Autism in her son; Breast cancer in her daughter; Cancer in her father; Lung cancer in her father.  ROS:   Please see the history of present illness.    Review of Systems  Constitutional:  Positive for malaise/fatigue. Negative for chills and fever.  HENT:  Negative for hearing loss.   Eyes:  Negative for blurred vision.  Respiratory:  Positive for shortness of breath.   Cardiovascular:  Positive for orthopnea and leg swelling. Negative for chest pain, palpitations, claudication and PND.  Gastrointestinal:  Negative for melena, nausea and vomiting.  Genitourinary:  Negative for hematuria.  Musculoskeletal:  Negative for falls.  Neurological:  Negative for dizziness and loss of consciousness.  Psychiatric/Behavioral:  Negative for substance abuse.     EKGs/Labs/Other Studies Reviewed:    The following studies were  reviewed today: TTE 03/29/21: LVEF 20-25%, normal RV, mild-moderate MR, small pericardial effusion, severe LAE, elevated filling pressures, RAP 29mmHg  TTE 11/13/18: IMPRESSIONS   1. Left ventricular ejection fraction, by visual estimation, is 45 to  50%. The left ventricle has mildly decreased function. Normal left  ventricular size. There is no left ventricular hypertrophy.   2. Elevated left ventricular end-diastolic pressure.   3. Left ventricular diastolic Doppler parameters are consistent with  impaired relaxation pattern of LV diastolic filling.   4. Global longitudinal strain abnormal (-11.5%). Very slightly worse than  10/2017.   5. Global right ventricle has normal systolic function.The right  ventricular size is normal. No increase in right ventricular wall  thickness.  6. Left atrial size was moderately dilated.   7. Right atrial size was normal.   8. The mitral valve is normal in structure. Trace mitral valve  regurgitation. No evidence of mitral stenosis.   9. The tricuspid valve is normal in structure. Tricuspid valve  regurgitation is mild.  10. The aortic valve is normal in structure. Aortic valve regurgitation  was not visualized by color flow Doppler. Structurally normal aortic  valve, with no evidence of sclerosis or stenosis.  11. The pulmonic valve was normal in structure. Pulmonic valve  regurgitation is trivial by color flow Doppler.  12. Mildly elevated pulmonary artery systolic pressure.  13. The inferior vena cava is normal in size with greater than 50%  respiratory variability, suggesting right atrial pressure of 3 mmHg.     EKG:  ECG with NSR with 1 degree AVB, poor r-wave progression (similar to prior)  Recent Labs: 02/17/2021: ALT 29; BUN 20; Creatinine 0.71; Potassium 4.3; Sodium 139 02/24/2021: Hemoglobin 10.0; Platelets 128  Recent Lipid Panel    Component Value Date/Time   CHOL 126 01/28/2020 1315   TRIG 190 (H) 01/28/2020 1315   HDL 29 (L)  01/28/2020 1315   CHOLHDL 4.3 01/28/2020 1315   LDLCALC 65 01/28/2020 1315     Risk Assessment/Calculations:           Physical Exam:    VS:  BP 108/60    Pulse 75    Ht 5\' 1"  (1.549 m)    Wt 138 lb 3.2 oz (62.7 kg)    SpO2 96%    BMI 26.11 kg/m     Wt Readings from Last 3 Encounters:  03/29/21 138 lb 3.2 oz (62.7 kg)  03/22/21 141 lb 12.8 oz (64.3 kg)  02/17/21 133 lb 8 oz (60.6 kg)     GEN:  Comfortable HEENT: Normal NECK: JVD to angle of the mandible CARDIAC: RRR, 2/6 systolic murmur. No rubs, gallops RESPIRATORY:  Diminished at bases, otherwise clear ABDOMEN: Soft, non-tender, non-distended MUSCULOSKELETAL:  2+ pitting edema to the hips, warm on exam SKIN: Warm and dry NEUROLOGIC:  Alert and oriented x 3 PSYCHIATRIC:  Normal affect   ASSESSMENT:    1. Acute on chronic systolic heart failure ([place])   2. Medication management   3. Nonischemic cardiomyopathy ([place])   4. Chronic systolic heart failure ([place])   5. Essential hypertension   6. NICM (nonischemic cardiomyopathy) ([place])   7. Thrombocytosis   8. Benign essential HTN   9. Leukocytosis, unspecified type    PLAN:    In order of problems listed above:  #Acute on Chronic Systolic Heart Failure  #Nonischemic CM: #History of chemotherapy induced CM: Patient with history of chemo induced CM with improved EF to 45-50% on multiple subsequent TTEs. Was doing well until about 2 months ago when she developed progressive fatigue, LE edema and abdominal distension. Saw her oncologist who ordered TTE which was done today and per my prelim review reveals EF 20-25% global hypokinesis, mild-to-moderate MR, severe LAE, RV okay, elevated filling pressures, RAP 62mmHg, small pericardial effusion. Was scheduled as urgent add-on today. Currently, the patient states she has continued fatigue with 22lbs weight gain over the past 2 months. Has mild dyspnea on exertion and orthopnea but is able to remain relatively active. No chest pain  or known triggers. Has been compliant with all medications and no recent illness or life stressors. Does not have known history of CAD. Last chemo was in 1996. Hydroxurea was recently started but  unlikely to be the culprit. Recommended admission for further work-up but she would prefer to trial outpatient management at this time. She is fortunately warm on exam and relatively well appearing. Will hold BB due to significant decompensation and stop HCTZ. Will start diuresis with lasix with close follow-up in clinic with DOD next week and me the week after. Ultimately will need RHC/LHC once more euvolemic. If fails outpatient management, will admit and patient is amenable to this. -Patient would like to try outpatient management but amenable to be admitted if failing -Start diuresis with lasix 40mg  PO BID x5 days and then 40mg  daily thereafter -Hold coreg due to acute decompensation and relatively low output -Continue ramipril 5mg  daily for now with plans to transition to entresto (do not want to make too many changes today) -Start spironolactone 25mg  daily -Will add SGLT2i and resume BB hopefully in the next couple of weeks -Start K-dur 74mEq BID for 5 days and then daily thereafter (will take with each dose of lasix) -Start Mg 400mg  BID for 5 days and then daily thereafter (will take with each dose of lasix) -BNP pending today -Check BMET and Mg next week -Will keep a log of her daily weights -States she will call us if symptoms are worsening or not losing weight -Will see Dr. Clayton Bibles next week and me in follow-up the week after -Plan for RHC/LHC once euvolemic  -Low Na diet -Will need to be admitted if failing outpatient management  #HTN: Controlled.  -Holding coreg and HCTZ as above -Continue ramipril 5mg  -Start spironolactone 25mg  daily  #HLD: LDL 65 01/2020. -Continue lipitor 80mg  daily  #History of Breast Cancer in remission History of chemo with anthracycline and XRT. Had depressed LVEF  but recovered to 45-50%, now back down to 20-25% as detailed above. -Management acute on chronic HF as above  #Leukocytosis: #Thrombocytosis: -On hydroxyurea   Medication Adjustments/Labs and Tests Ordered: Current medicines are reviewed at length with the patient today.  Concerns regarding medicines are outlined above.  Orders Placed This Encounter  Procedures   Basic metabolic panel   Magnesium   EKG 12-Lead   Meds ordered this encounter  Medications   furosemide (LASIX) 40 MG tablet    Sig: Take 1 tablet (40 mg total) by mouth twice daily for 5 days only, then decrease to taking 1 tablet (40 mg total) by mouth daily thereafter.    Dispense:  35 tablet    Refill:  0   potassium chloride SA (KLOR-CON M) 20 MEQ tablet    Sig: Take 1 tablet (20 mEq total) by mouth twice daily for 5 days only, then decrease to taking 1 tablet (20 mEq total) by mouth daily thereafter.    Dispense:  35 tablet    Refill:  0   Magnesium 400 MG TABS    Sig: Take 1 tablet (400 mg total) by mouth twice daily for 5 days only, then decrease to taking 1 tablet (400 mg total) by mouth daily thereafter.    Dispense:  35 tablet    Refill:  0   spironolactone (ALDACTONE) 25 MG tablet    Sig: Take 1 tablet (25 mg total) by mouth daily.    Dispense:  90 tablet    Refill:  0     Patient Instructions  Medication Instructions:   STOP TAKING HYDROCHLOROTHIAZIDE NOW  STOP TAKING CARVEDILOL (COREG) NOW  START TAKING SPIRONOLACTONE 25 MG BY MOUTH DAILY  START TAKING LASIX 40 MG BY MOUTH TWICE DAILY FOR  5 DAYS ONLY, THEN DECREASE TO TAKING 40 MG BY MOUTH DAILY THEREAFTER.   START TAKING POTASSIUM CHLORIDE 20 mEq BY MOUTH TWICE DAILY FOR 5 DAYS ONLY, THEN DECREASE TO TAKING 20 mEq BY MOUTH DAILY THEREAFTER.   START TAKING MAGNESIUM 400 MG BY MOUTH TWICE DAILY FOR 5 DAYS ONLY, THEN DECREASE TO TAKING 400 MG BY MOUTH DAILY THEREAFTER.    *If you need a refill on your cardiac medications before your next  appointment, please call your pharmacy*   Lab Work:  ON NEXT Monday 04/04/21--BMET AND MAGNESIUM LEVEL  If you have labs (blood work) drawn today and your tests are completely normal, you will receive your results only by: [place] (if you have MyChart) OR A paper copy in the mail If you have any lab test that is abnormal or we need to change your treatment, we will call you to review the results.   Follow-Up:  1.) WITH DOD DR. VARANASI ON NEXT Wednesday 04/06/21 AT EITHER HIS 10:30 AM OR 2:30 PM SLOT PER DR. Johney Frame  2.) WITH DR. Johney Frame ON 04/15/21 AT 10:20 AM SLOT   1}  Other Instructions  PLEASE START DRY WEIGHING YOURSELF DAILY AND RECORD THIS--BRING THESE RECORDINGS TO YOUR UPCOMING OFFICE VISIT APPOINTMENTS     Signed, Freada Bergeron, MD  03/29/2021 11:44 AM    [place]

## 2021-03-29 NOTE — Patient Instructions (Signed)
Medication Instructions:   STOP TAKING HYDROCHLOROTHIAZIDE NOW  STOP TAKING CARVEDILOL (COREG) NOW  START TAKING SPIRONOLACTONE 25 MG BY MOUTH DAILY  START TAKING LASIX 40 MG BY MOUTH TWICE DAILY FOR 5 DAYS ONLY, THEN DECREASE TO TAKING 40 MG BY MOUTH DAILY THEREAFTER.   START TAKING POTASSIUM CHLORIDE 20 mEq BY MOUTH TWICE DAILY FOR 5 DAYS ONLY, THEN DECREASE TO TAKING 20 mEq BY MOUTH DAILY THEREAFTER.   START TAKING MAGNESIUM 400 MG BY MOUTH TWICE DAILY FOR 5 DAYS ONLY, THEN DECREASE TO TAKING 400 MG BY MOUTH DAILY THEREAFTER.    *If you need a refill on your cardiac medications before your next appointment, please call your pharmacy*   Lab Work:  ON NEXT Monday 04/04/21--BMET AND MAGNESIUM LEVEL  If you have labs (blood work) drawn today and your tests are completely normal, you will receive your results only by: [place] (if you have MyChart) OR A paper copy in the mail If you have any lab test that is abnormal or we need to change your treatment, we will call you to review the results.   Follow-Up:  1.) WITH DOD DR. VARANASI ON NEXT Wednesday 04/06/21 AT EITHER HIS 10:30 AM OR 2:30 PM SLOT PER DR. Johney Frame  2.) WITH DR. Johney Frame ON 04/15/21 AT 10:20 AM SLOT   1}  Other Instructions  PLEASE START DRY WEIGHING YOURSELF DAILY AND RECORD THIS--BRING THESE RECORDINGS TO YOUR UPCOMING OFFICE VISIT APPOINTMENTS

## 2021-03-30 DIAGNOSIS — Z1389 Encounter for screening for other disorder: Secondary | ICD-10-CM | POA: Diagnosis not present

## 2021-03-30 DIAGNOSIS — Z0000 Encounter for general adult medical examination without abnormal findings: Secondary | ICD-10-CM | POA: Diagnosis not present

## 2021-03-30 LAB — PRO B NATRIURETIC PEPTIDE: NT-Pro BNP: 8726 pg/mL — ABNORMAL HIGH (ref 0–738)

## 2021-04-04 ENCOUNTER — Other Ambulatory Visit: Payer: Self-pay

## 2021-04-04 ENCOUNTER — Other Ambulatory Visit: Payer: Medicare Other | Admitting: *Deleted

## 2021-04-04 DIAGNOSIS — I5022 Chronic systolic (congestive) heart failure: Secondary | ICD-10-CM

## 2021-04-04 DIAGNOSIS — I10 Essential (primary) hypertension: Secondary | ICD-10-CM

## 2021-04-04 DIAGNOSIS — I428 Other cardiomyopathies: Secondary | ICD-10-CM

## 2021-04-04 DIAGNOSIS — Z79899 Other long term (current) drug therapy: Secondary | ICD-10-CM

## 2021-04-04 LAB — BASIC METABOLIC PANEL
BUN/Creatinine Ratio: 26 (ref 12–28)
BUN: 19 mg/dL (ref 8–27)
CO2: 26 mmol/L (ref 20–29)
Calcium: 9.3 mg/dL (ref 8.7–10.3)
Chloride: 98 mmol/L (ref 96–106)
Creatinine, Ser: 0.73 mg/dL (ref 0.57–1.00)
Glucose: 126 mg/dL — ABNORMAL HIGH (ref 70–99)
Potassium: 4.1 mmol/L (ref 3.5–5.2)
Sodium: 138 mmol/L (ref 134–144)
eGFR: 85 mL/min/{1.73_m2} (ref >59)

## 2021-04-04 LAB — MAGNESIUM: Magnesium: 2 mg/dL (ref 1.6–2.3)

## 2021-04-05 ENCOUNTER — Inpatient Hospital Stay: Payer: Medicare Other

## 2021-04-05 ENCOUNTER — Other Ambulatory Visit: Payer: Self-pay | Admitting: *Deleted

## 2021-04-05 VITALS — BP 104/73 | HR 103 | Temp 98.5°F | Resp 16

## 2021-04-05 DIAGNOSIS — D75839 Thrombocytosis, unspecified: Secondary | ICD-10-CM

## 2021-04-05 DIAGNOSIS — D471 Chronic myeloproliferative disease: Secondary | ICD-10-CM

## 2021-04-05 DIAGNOSIS — D72829 Elevated white blood cell count, unspecified: Secondary | ICD-10-CM

## 2021-04-05 DIAGNOSIS — C4359 Malignant melanoma of other part of trunk: Secondary | ICD-10-CM

## 2021-04-05 DIAGNOSIS — D469 Myelodysplastic syndrome, unspecified: Secondary | ICD-10-CM

## 2021-04-05 DIAGNOSIS — I428 Other cardiomyopathies: Secondary | ICD-10-CM

## 2021-04-05 DIAGNOSIS — D46Z Other myelodysplastic syndromes: Secondary | ICD-10-CM | POA: Diagnosis not present

## 2021-04-05 LAB — CBC WITH DIFFERENTIAL/PLATELET
Abs Immature Granulocytes: 9.34 10*3/uL — ABNORMAL HIGH (ref 0.00–0.07)
Basophils Absolute: 0 10*3/uL (ref 0.0–0.1)
Basophils Relative: 0 %
Eosinophils Absolute: 0 10*3/uL (ref 0.0–0.5)
Eosinophils Relative: 0 %
HCT: 28.5 % — ABNORMAL LOW (ref 36.0–46.0)
Hemoglobin: 9.7 g/dL — ABNORMAL LOW (ref 12.0–15.0)
Immature Granulocytes: 13 %
Lymphocytes Relative: 5 %
Lymphs Abs: 3.4 10*3/uL (ref 0.7–4.0)
MCH: 36.3 pg — ABNORMAL HIGH (ref 26.0–34.0)
MCHC: 34 g/dL (ref 30.0–36.0)
MCV: 106.7 fL — ABNORMAL HIGH (ref 80.0–100.0)
Monocytes Absolute: 0.8 10*3/uL (ref 0.1–1.0)
Monocytes Relative: 1 %
Neutro Abs: 59.6 10*3/uL — ABNORMAL HIGH (ref 1.7–7.7)
Neutrophils Relative %: 81 %
Platelets: 179 10*3/uL (ref 150–400)
RBC: 2.67 MIL/uL — ABNORMAL LOW (ref 3.87–5.11)
RDW: 23.9 % — ABNORMAL HIGH (ref 11.5–15.5)
Smear Review: NORMAL
WBC: 73.1 10*3/uL (ref 4.0–10.5)
nRBC: 0 % (ref 0.0–0.2)

## 2021-04-05 LAB — RETICULOCYTES
Immature Retic Fract: 29.3 % — ABNORMAL HIGH (ref 2.3–15.9)
RBC.: 2.65 MIL/uL — ABNORMAL LOW (ref 3.87–5.11)
Retic Count, Absolute: 41.6 10*3/uL (ref 19.0–186.0)
Retic Ct Pct: 1.6 % (ref 0.4–3.1)

## 2021-04-05 LAB — SAVE SMEAR(SSMR), FOR PROVIDER SLIDE REVIEW

## 2021-04-05 LAB — FERRITIN: Ferritin: 1777 ng/mL — ABNORMAL HIGH (ref 11–307)

## 2021-04-05 MED ORDER — DARBEPOETIN ALFA 300 MCG/0.6ML IJ SOSY
300.0000 ug | PREFILLED_SYRINGE | Freq: Once | INTRAMUSCULAR | Status: AC
  Administered 2021-04-05: 300 ug via SUBCUTANEOUS
  Filled 2021-04-05: qty 0.6

## 2021-04-05 NOTE — Patient Instructions (Signed)
Darbepoetin Alfa injection ?What is this medication? ?DARBEPOETIN ALFA (dar be POE e tin  AL fa) helps your body make more red blood cells. It is used to treat anemia caused by chronic kidney failure and chemotherapy. ?This medicine may be used for other purposes; ask your health care provider or pharmacist if you have questions. ?COMMON BRAND NAME(S): Aranesp ?What should I tell my care team before I take this medication? ?They need to know if you have any of these conditions: ?blood clotting disorders or history of blood clots ?cancer patient not on chemotherapy ?cystic fibrosis ?heart disease, such as angina, heart failure, or a history of a heart attack ?hemoglobin level of 12 g/dL or greater ?high blood pressure ?low levels of folate, iron, or vitamin B12 ?seizures ?an unusual or allergic reaction to darbepoetin, erythropoietin, albumin, hamster proteins, latex, other medicines, foods, dyes, or preservatives ?pregnant or trying to get pregnant ?breast-feeding ?How should I use this medication? ?This medicine is for injection into a vein or under the skin. It is usually given by a health care professional in a hospital or clinic setting. ?If you get this medicine at home, you will be taught how to prepare and give this medicine. Use exactly as directed. Take your medicine at regular intervals. Do not take your medicine more often than directed. ?It is important that you put your used needles and syringes in a special sharps container. Do not put them in a trash can. If you do not have a sharps container, call your pharmacist or healthcare provider to get one. ?A special MedGuide will be given to you by the pharmacist with each prescription and refill. Be sure to read this information carefully each time. ?Talk to your pediatrician regarding the use of this medicine in children. While this medicine may be used in children as young as 1 month of age for selected conditions, precautions do apply. ?Overdosage: If  you think you have taken too much of this medicine contact a poison control center or emergency room at once. ?NOTE: This medicine is only for you. Do not share this medicine with others. ?What if I miss a dose? ?If you miss a dose, take it as soon as you can. If it is almost time for your next dose, take only that dose. Do not take double or extra doses. ?What may interact with this medication? ?Do not take this medicine with any of the following medications: ?epoetin alfa ?This list may not describe all possible interactions. Give your health care provider a list of all the medicines, herbs, non-prescription drugs, or dietary supplements you use. Also tell them if you smoke, drink alcohol, or use illegal drugs. Some items may interact with your medicine. ?What should I watch for while using this medication? ?Your condition will be monitored carefully while you are receiving this medicine. ?You may need blood work done while you are taking this medicine. ?This medicine may cause a decrease in vitamin B6. You should make sure that you get enough vitamin B6 while you are taking this medicine. Discuss the foods you eat and the vitamins you take with your health care professional. ?What side effects may I notice from receiving this medication? ?Side effects that you should report to your doctor or health care professional as soon as possible: ?allergic reactions like skin rash, itching or hives, swelling of the face, lips, or tongue ?breathing problems ?changes in vision ?chest pain ?confusion, trouble speaking or understanding ?feeling faint or lightheaded, falls ?high blood   pressure ?muscle aches or pains ?pain, swelling, warmth in the leg ?rapid weight gain ?severe headaches ?sudden numbness or weakness of the face, arm or leg ?trouble walking, dizziness, loss of balance or coordination ?seizures (convulsions) ?swelling of the ankles, feet, hands ?unusually weak or tired ?Side effects that usually do not require  medical attention (report to your doctor or health care professional if they continue or are bothersome): ?diarrhea ?fever, chills (flu-like symptoms) ?headaches ?nausea, vomiting ?redness, stinging, or swelling at site where injected ?This list may not describe all possible side effects. Call your doctor for medical advice about side effects. You may report side effects to FDA at 1-800-FDA-1088. ?Where should I keep my medication? ?Keep out of the reach of children. ?Store in a refrigerator between 2 and 8 degrees C (36 and 46 degrees F). Do not freeze. Do not shake. Throw away any unused portion if using a single-dose vial. Throw away any unused medicine after the expiration date. ?NOTE: This sheet is a summary. It may not cover all possible information. If you have questions about this medicine, talk to your doctor, pharmacist, or health care provider. ?? 2022 Elsevier/Gold Standard (2017-02-19 00:00:00) ? ?

## 2021-04-05 NOTE — Progress Notes (Signed)
CRITICAL VALUE STICKER  CRITICAL VALUE: WBC 73.1  RECEIVER (on-site recipient of call): [place] NOTIFIED: [id number]  MESSENGER (representative from lab): Ulice Dash  MD NOTIFIED: Yes

## 2021-04-05 NOTE — Progress Notes (Signed)
Cardiology Office Note   Date:  04/06/2021   ID:  Tanya Mitchell, DOB 04-05-1945, MRN [id number]  PCP:  Marda Stalker, PA-C    No chief complaint on file.  Acute on chronic systolic heart failure  Wt Readings from Last 3 Encounters:  04/06/21 107 lb 3.2 oz (48.6 kg)  03/29/21 138 lb 3.2 oz (62.7 kg)  03/22/21 141 lb 12.8 oz (64.3 kg)       History of Present Illness: Tanya Mitchell is a 76 y.o. female who is the wife of my patient, [name].  She has a history of nonischemic cardiomyopathy, secondary to chemo from breast cancer with improved EF, melenoma, HLD and DM2.  TTE 09/2020 with LVEF 45-50%, normal RV, normal PASP, severe LAE, trivial MR, no significant aortic disease.  She developed progressive fatigue which prompted an echo in 2/23. TTE showed: LVEF  20-25%, normal RV, mild vs mild moderate MR, small pericardial effusion, RAP 15.  Plan per Dr. Johney Frame on 03/29/2021 was: "Start diuresis with lasix 40mg  PO BID x5 days and then 40mg  daily thereafter -Hold coreg due to acute decompensation and relatively low output -Continue ramipril 5mg  daily for now with plans to transition to entresto (do not want to make too many changes today) -Start spironolactone 25mg  daily -Will add SGLT2i and resume BB hopefully in the next couple of weeks -Start K-dur 41mEq BID for 5 days and then daily thereafter (will take with each dose of lasix) -Start Mg 400mg  BID for 5 days and then daily thereafter (will take with each dose of lasix) -BNP pending today -Check BMET and Mg next week -Will keep a log of her daily weights -States she will call us if symptoms are worsening or not losing weight -Will see Dr. Clayton Bibles next week and me in follow-up the week after -Plan for RHC/LHC once euvolemic"  She has lost 30 lbs in the past week.  SHe feels back to normal.  Swelling and SHOB have resolved.   Past Medical History:  Diagnosis Date   Breast cancer ([place]) 1995 and  2007   Cardiomyopathy secondary to chemotherapy [facility])    has improved    Hyperlipidemia    Hypertension    under control, has been on med. x 3 yrs.   Melanoma in situ of back [facility]) 09/2011   left   Personal history of chemotherapy    Personal history of radiation therapy    Skin cancer 2013   Melanoma    Past Surgical History:  Procedure Laterality Date   BREAST LUMPECTOMY Right 12/21/2005   right   MASTECTOMY Left 1995   with breast reconstruction - left     Current Outpatient Medications  Medication Sig Dispense Refill   albuterol (VENTOLIN HFA) 108 (90 Base) MCG/ACT inhaler Inhale 1 puff into the lungs as needed.     allopurinol (ZYLOPRIM) 300 MG tablet Take 1 tablet (300 mg total) by mouth daily. 60 tablet 6   ascorbic acid (VITAMIN C) 500 MG tablet Take 1 tablet by mouth daily.     atorvastatin (LIPITOR) 80 MG tablet Take 1 tablet (80 mg total) by mouth daily. 90 tablet 2   cholecalciferol (VITAMIN D) 1000 UNITS tablet Take 2,000 Units by mouth daily.      furosemide (LASIX) 40 MG tablet Take 1 tablet (40 mg total) by mouth twice daily for 5 days only, then decrease to taking 1 tablet (40 mg total) by mouth daily thereafter. 35 tablet 0  glucosamine-chondroitin 500-400 MG tablet Take 1 tablet by mouth daily.     hydroxyurea (HYDREA) 500 MG capsule Take 500 mg by mouth daily. May take with food to minimize GI side effects. She takes it every other day     Magnesium 400 MG TABS Take 1 tablet (400 mg total) by mouth twice daily for 5 days only, then decrease to taking 1 tablet (400 mg total) by mouth daily thereafter. 35 tablet 0   metFORMIN (GLUCOPHAGE) 500 MG tablet 250 mg twice a day.     potassium chloride SA (KLOR-CON M) 20 MEQ tablet Take 1 tablet (20 mEq total) by mouth twice daily for 5 days only, then decrease to taking 1 tablet (20 mEq total) by mouth daily thereafter. 35 tablet 0   ramipril (ALTACE) 5 MG capsule Take 1 capsule (5 mg total) by mouth daily. 30 capsule 3    spironolactone (ALDACTONE) 25 MG tablet Take 1 tablet (25 mg total) by mouth daily. 90 tablet 0   triamcinolone (KENALOG) 0.025 % ointment Apply topically as needed.     No current facility-administered medications for this visit.    Allergies:   Patient has no known allergies.    Social History:  The patient  reports that she quit smoking about 57 years ago. Her smoking use included cigarettes. She has never used smokeless tobacco. She reports current alcohol use. She reports that she does not use drugs.   Family History:  The patient's family history includes Autism in her son; Breast cancer in her daughter; Cancer in her father; Lung cancer in her father.    ROS:  Please see the history of present illness.   Otherwise, review of systems are positive for .   All other systems are reviewed and negative.    PHYSICAL EXAM: VS:  BP 110/60    Pulse (!) 107    Ht 5\' 1"  (1.549 m)    Wt 107 lb 3.2 oz (48.6 kg)    SpO2 97%    BMI 20.26 kg/m  , BMI Body mass index is 20.26 kg/m. GEN: Well nourished, well developed, in no acute distress HEENT: normal Neck: no JVD, carotid bruits, or masses Cardiac: RRR; no murmurs, rubs, or gallops,no edema  Respiratory:  clear to auscultation bilaterally, normal work of breathing GI: soft, nontender, nondistended, + BS MS: no deformity or atrophy Skin: warm and dry, no rash Neuro:  Strength and sensation are intact Psych: euthymic mood, full affect    Recent Labs: 02/17/2021: ALT 29 03/29/2021: NT-Pro BNP 8,726 04/04/2021: BUN 19; Creatinine, Ser 0.73; Magnesium 2.0; Potassium 4.1; Sodium 138 04/05/2021: Hemoglobin 9.7; Platelets 179   Lipid Panel    Component Value Date/Time   CHOL 126 01/28/2020 1315   TRIG 190 (H) 01/28/2020 1315   HDL 29 (L) 01/28/2020 1315   CHOLHDL 4.3 01/28/2020 1315   LDLCALC 65 01/28/2020 1315     Other studies Reviewed: Additional studies/ records that were reviewed today with results demonstrating: labs  reviewed.   ASSESSMENT AND PLAN:  Acute on chronic systolic heart failure: She has had a remarkable diuresis over the past week.  She has lost more than 30 pounds since starting furosemide.  More recent ejection fraction down to 20 to 25%.  Goal is to stop ramipril and start Entresto for better heart failure therapy.  Carvedilol has been on hold as well.  Since her heart rate is high, we will add back carvedilol 6.25 mg twice daily.  It appears she  was on 25 mg twice daily in the past.  She will check her blood pressure and heart rate and send [place] readings via MyChart.  We will try to uptitrate the carvedilol as needed.  Once heart rate is better controlled, would make the switch from ramipril to [facility].  We will also give a prescription for Lasix 40 mg daily as needed.  She will weigh herself every day.  If her weight goes up by more than 3 pounds, she will take a Lasix and potassium tablet. Hypertension: The current medical regimen is effective;  continue present plan and medications.  Low-salt diet. Hyperlipidemia: LDL 51 in 2022.  Continue atorvastatin 80 mg daily.  Once her medicine regimen is more stabilized, cardiac cath would be reasonable to check pressures in evaluate for CAD.  We discussed this briefly during the visit.   Current medicines are reviewed at length with the patient today.  The patient concerns regarding her medicines were addressed.  The following changes have been made:add carvedilol  Labs/ tests ordered today include:  No orders of the defined types were placed in this encounter.   Recommend 150 minutes/week of aerobic exercise Low fat, low carb, high fiber diet recommended  Disposition:   FU in as scheduled with Dr. Johney Frame.    Signed, Larae Grooms, MD  04/06/2021 10:52 AM    [facility] [address], [place], [place]  [address] Phone: [phone]; Fax: [phone]

## 2021-04-06 ENCOUNTER — Other Ambulatory Visit: Payer: Self-pay

## 2021-04-06 ENCOUNTER — Ambulatory Visit: Payer: Medicare Other | Admitting: Interventional Cardiology

## 2021-04-06 ENCOUNTER — Encounter: Payer: Self-pay | Admitting: Interventional Cardiology

## 2021-04-06 VITALS — BP 110/60 | HR 107 | Ht 61.0 in | Wt 107.2 lb

## 2021-04-06 DIAGNOSIS — I5022 Chronic systolic (congestive) heart failure: Secondary | ICD-10-CM | POA: Diagnosis not present

## 2021-04-06 DIAGNOSIS — I428 Other cardiomyopathies: Secondary | ICD-10-CM | POA: Diagnosis not present

## 2021-04-06 DIAGNOSIS — I10 Essential (primary) hypertension: Secondary | ICD-10-CM | POA: Diagnosis not present

## 2021-04-06 DIAGNOSIS — E782 Mixed hyperlipidemia: Secondary | ICD-10-CM

## 2021-04-06 MED ORDER — CARVEDILOL 6.25 MG PO TABS: 6.2500 mg | ORAL_TABLET | Freq: Two times a day (BID) | ORAL | 3 refills | Status: DC

## 2021-04-06 MED ORDER — POTASSIUM CHLORIDE CRYS ER 20 MEQ PO TBCR: 20.0000 meq | EXTENDED_RELEASE_TABLET | Freq: Every day | ORAL | 1 refills | Status: AC | PRN

## 2021-04-06 MED ORDER — MAGNESIUM 400 MG PO TABS: 1.0000 | ORAL_TABLET | Freq: Every day | ORAL | 1 refills | Status: AC | PRN

## 2021-04-06 MED ORDER — FUROSEMIDE 40 MG PO TABS: 40.0000 mg | ORAL_TABLET | Freq: Every day | ORAL | 1 refills | Status: AC | PRN

## 2021-04-06 NOTE — Patient Instructions (Addendum)
Medication Instructions:  Your physician has recommended you make the following change in your medication: Start Carvedilol 6.25 mg by mouth twice daily. Change furosemide to 40 mg by mouth daily as needed.  Only take potassium  and magnesium on days you take furosemide  *If you need a refill on your cardiac medications before your next appointment, please call your pharmacy*   Lab Work: none If you have labs (blood work) drawn today and your tests are completely normal, you will receive your results only by: [facility] (if you have MyChart) OR A paper copy in the mail If you have any lab test that is abnormal or we need to change your treatment, we will call you to review the results.   Testing/Procedures: none   Follow-Up: At [facility], you and your health needs are our priority.  As part of our continuing mission to provide you with exceptional heart care, we have created designated Provider Care Teams.  These Care Teams include your primary Cardiologist (physician) and Advanced Practice Providers (APPs -  Physician Assistants and Nurse Practitioners) who all work together to provide you with the care you need, when you need it.  We recommend signing up for the patient portal called "MyChart".  Sign up information is provided on this After Visit Summary.  MyChart is used to connect with patients for Virtual Visits (Telemedicine).  Patients are able to view lab/test results, encounter notes, upcoming appointments, etc.  Non-urgent messages can be sent to your provider as well.   To learn more about what you can do with MyChart, go to [email or web address].    Your next appointment:   April 15, 2021  The format for your next appointment:   In Person  Provider:   Dr Johney Frame  If primary card or EP is not listed click here to update    :1}    Other Instructions

## 2021-04-10 NOTE — Progress Notes (Deleted)
Cardiology Office Note:    Date:  04/10/2021   ID:  Keith Rake Lasseter, DOB 1945/04/06, MRN [id number]  PCP:  Freada Bergeron, MD   [facility] Providers Cardiologist:  None {  Referring MD: Marda Stalker, PA-C    History of Present Illness:    Tanya Mitchell is a 76 y.o. female with a hx of prior cardiomyopathy secondary to chemo from breast cancer with improved EF, melenoma, HLD and DMII who was previously followed by Truitt Merle, NP who now presents to clinic for follow-up.  Seen in clinic 08/2020 where she was doing well with no anginal or HF symptoms. TTE 09/2020 with LVEF 45-50%, normal RV, normal PASP, severe LAE, trivial MR, no significant aortic disease.  Was seen in clinic on 03/29/21 as an urgent visit for worsening edema, weight gain, fatigue and abdominal distension. TTE with LVEF 25-30%, mild LV dilation, G2DD, mild RV dysfunction, severe LAE, mild MR, moderate pleural effusion and small pericardial effusion. We discussed hospitalization but she wanted to trial out-patient management. We started her on lasix 40mg  BID x5 days and then daily thereafter. We held her coreg and started spiro.   She saw Dr. Irish Lack in follow-up on 03/2021 where she had lost 30lbs. Symptoms had improved significantly and she felt back to baseline. She was resumed on coreg 6.25mg  BID and her lasix was changed to prn.  Today, ***  Past Medical History:  Diagnosis Date   Breast cancer ([place]) 1995 and 2007   Cardiomyopathy secondary to chemotherapy [facility])    has improved    Hyperlipidemia    Hypertension    under control, has been on med. x 3 yrs.   Melanoma in situ of back [facility]) 09/2011   left   Personal history of chemotherapy    Personal history of radiation therapy    Skin cancer 2013   Melanoma    Past Surgical History:  Procedure Laterality Date   BREAST LUMPECTOMY Right 12/21/2005   right   MASTECTOMY Left 1995   with breast reconstruction - left     Current Medications: No outpatient medications have been marked as taking for the 04/15/21 encounter (Appointment) with Freada Bergeron, MD.     Allergies:   Patient has no known allergies.   Social History   Socioeconomic History   Marital status: Married    Spouse name: Not on file   Number of children: Not on file   Years of education: Not on file   Highest education level: Not on file  Occupational History   Not on file  Tobacco Use   Smoking status: Former    Types: Cigarettes    Quit date: 02/14/1964    Years since quitting: 44.1   Smokeless tobacco: Never  Vaping Use   Vaping Use: Never used  Substance and Sexual Activity   Alcohol use: Yes    Comment: daily glass of wine   Drug use: No   Sexual activity: Yes    Birth control/protection: Post-menopausal  Other Topics Concern   Not on file  Social History Narrative   Not on file   Social Determinants of Health   Financial Resource Strain: Not on file  Food Insecurity: Not on file  Transportation Needs: Not on file  Physical Activity: Not on file  Stress: Not on file  Social Connections: Not on file     Family History: The patient's family history includes Autism in her son; Breast cancer in her daughter; Cancer in  her father; Lung cancer in her father.  ROS:   Please see the history of present illness.    Review of Systems  Constitutional:  Positive for malaise/fatigue. Negative for chills and fever.  HENT:  Negative for hearing loss.   Eyes:  Negative for blurred vision.  Respiratory:  Positive for shortness of breath.   Cardiovascular:  Positive for orthopnea and leg swelling. Negative for chest pain, palpitations, claudication and PND.  Gastrointestinal:  Negative for melena, nausea and vomiting.  Genitourinary:  Negative for hematuria.  Musculoskeletal:  Negative for falls.  Neurological:  Negative for dizziness and loss of consciousness.  Psychiatric/Behavioral:  Negative for substance  abuse.     EKGs/Labs/Other Studies Reviewed:    The following studies were reviewed today: TTE 04-08-2021: 1. Compared to 09/30/20 LV function is worse.   2. Left ventricular ejection fraction, by estimation, is 25 to 30%. The  left ventricle has severely decreased function. The left ventricle  demonstrates global hypokinesis. The left ventricular internal cavity size  was mildly dilated. Left ventricular  diastolic parameters are consistent with Grade II diastolic dysfunction  (pseudonormalization). Elevated left atrial pressure.   3. Right ventricular systolic function is mildly reduced. The right  ventricular size is normal.   4. Left atrial size was severely dilated.   5. A small pericardial effusion is present. Moderate pleural effusion in  the left lateral region.   6. The mitral valve is normal in structure. Mild mitral valve  regurgitation. No evidence of mitral stenosis.   7. The aortic valve is tricuspid. Aortic valve regurgitation is not  visualized. Aortic valve sclerosis is present, with no evidence of aortic  valve stenosis.   8. The inferior vena cava is normal in size with greater than 50%  respiratory variability, suggesting right atrial pressure of 3 mmHg.   Comparison(s): Prior Ejection Fraction 45-50%.  TTE 11/13/18: IMPRESSIONS   1. Left ventricular ejection fraction, by visual estimation, is 45 to  50%. The left ventricle has mildly decreased function. Normal left  ventricular size. There is no left ventricular hypertrophy.   2. Elevated left ventricular end-diastolic pressure.   3. Left ventricular diastolic Doppler parameters are consistent with  impaired relaxation pattern of LV diastolic filling.   4. Global longitudinal strain abnormal (-11.5%). Very slightly worse than  10/2017.   5. Global right ventricle has normal systolic function.The right  ventricular size is normal. No increase in right ventricular wall  thickness.   6. Left atrial size was  moderately dilated.   7. Right atrial size was normal.   8. The mitral valve is normal in structure. Trace mitral valve  regurgitation. No evidence of mitral stenosis.   9. The tricuspid valve is normal in structure. Tricuspid valve  regurgitation is mild.  10. The aortic valve is normal in structure. Aortic valve regurgitation  was not visualized by color flow Doppler. Structurally normal aortic  valve, with no evidence of sclerosis or stenosis.  11. The pulmonic valve was normal in structure. Pulmonic valve  regurgitation is trivial by color flow Doppler.  12. Mildly elevated pulmonary artery systolic pressure.  13. The inferior vena cava is normal in size with greater than 50%  respiratory variability, suggesting right atrial pressure of 3 mmHg.     EKG:  ECG with NSR with 1 degree AVB, poor r-wave progression (similar to prior)  Recent Labs: 02/17/2021: ALT 29 04-08-2021: NT-Pro BNP 8,726 04/04/2021: BUN 19; Creatinine, Ser 0.73; Magnesium 2.0; Potassium 4.1; Sodium  138 04/05/2021: Hemoglobin 9.7; Platelets 179  Recent Lipid Panel    Component Value Date/Time   CHOL 126 01/28/2020 1315   TRIG 190 (H) 01/28/2020 1315   HDL 29 (L) 01/28/2020 1315   CHOLHDL 4.3 01/28/2020 1315   LDLCALC 65 01/28/2020 1315     Risk Assessment/Calculations:           Physical Exam:    VS:  There were no vitals taken for this visit.    Wt Readings from Last 3 Encounters:  04/06/21 107 lb 3.2 oz (48.6 kg)  03/29/21 138 lb 3.2 oz (62.7 kg)  03/22/21 141 lb 12.8 oz (64.3 kg)     GEN:  Comfortable HEENT: Normal NECK: JVD to angle of the mandible CARDIAC: RRR, 2/6 systolic murmur. No rubs, gallops RESPIRATORY:  Diminished at bases, otherwise clear ABDOMEN: Soft, non-tender, non-distended MUSCULOSKELETAL:  2+ pitting edema to the hips, warm on exam SKIN: Warm and dry NEUROLOGIC:  Alert and oriented x 3 PSYCHIATRIC:  Normal affect   ASSESSMENT:    No diagnosis found.  PLAN:    In  order of problems listed above:  #Acute on Chronic Systolic Heart Failure  #Nonischemic CM: #History of chemotherapy induced CM: Patient with history of chemo induced CM with improved EF to 45-50% on multiple subsequent TTEs. Was doing well until about 2 months ago when she developed progressive fatigue, LE edema and abdominal distension. Saw her oncologist who ordered TTE on 03/29/21 which showed EF 25% global hypokinesis, mild-to-moderate MR, severe LAE, RV mildly enlarged, elevated filling pressures, RAP 48mmHg, small pericardial effusion. She preferred trial of outpatient management. We started lasix with significant improvement in volume status. Lost 30lbs. Symptoms overall significantly imprved. -Continue lasix 40mg  prn for weight gain -Continue coreg 6.25mg  BID -Stop rampril and start entresto 24/26mg  BID -Continue spironolactone 25mg  daily -Add farxiga 10mg  daily -Plan for RHC/LHC now that euvolemic -Low Na diet  #HTN: Controlled.  -Continue coreg 6.25mg  BID -Stop rampril and start entresto 24/26mg  BID -Continue spironolactone 25mg  daily  #HLD: LDL 65 01/2020. -Continue lipitor 80mg  daily  #History of Breast Cancer in remission History of chemo with anthracycline and XRT. Had depressed LVEF but recovered to 45-50%, now back down to 20-25% as detailed above. -Management acute on chronic HF as above  #Leukocytosis: #Thrombocytosis: -On hydroxyurea   Medication Adjustments/Labs and Tests Ordered: Current medicines are reviewed at length with the patient today.  Concerns regarding medicines are outlined above.  No orders of the defined types were placed in this encounter.  No orders of the defined types were placed in this encounter.    There are no Patient Instructions on file for this visit.    Signed, Freada Bergeron, MD  04/10/2021 8:12 PM    [place]

## 2021-04-15 ENCOUNTER — Other Ambulatory Visit: Payer: Self-pay

## 2021-04-15 ENCOUNTER — Ambulatory Visit: Payer: Medicare Other | Admitting: Cardiology

## 2021-04-15 ENCOUNTER — Encounter: Payer: Self-pay | Admitting: Cardiology

## 2021-04-15 VITALS — BP 108/52 | HR 89 | Ht 61.0 in | Wt 113.4 lb

## 2021-04-15 DIAGNOSIS — I5022 Chronic systolic (congestive) heart failure: Secondary | ICD-10-CM

## 2021-04-15 DIAGNOSIS — D75839 Thrombocytosis, unspecified: Secondary | ICD-10-CM

## 2021-04-15 DIAGNOSIS — I10 Essential (primary) hypertension: Secondary | ICD-10-CM | POA: Diagnosis not present

## 2021-04-15 DIAGNOSIS — Z01812 Encounter for preprocedural laboratory examination: Secondary | ICD-10-CM | POA: Diagnosis not present

## 2021-04-15 DIAGNOSIS — I428 Other cardiomyopathies: Secondary | ICD-10-CM

## 2021-04-15 DIAGNOSIS — C50919 Malignant neoplasm of unspecified site of unspecified female breast: Secondary | ICD-10-CM

## 2021-04-15 DIAGNOSIS — Z79899 Other long term (current) drug therapy: Secondary | ICD-10-CM | POA: Diagnosis not present

## 2021-04-15 LAB — CBC WITH DIFFERENTIAL/PLATELET

## 2021-04-15 MED ORDER — ENTRESTO 24-26 MG PO TABS: 1.0000 | ORAL_TABLET | Freq: Two times a day (BID) | ORAL | 3 refills | Status: DC

## 2021-04-15 MED ORDER — ASPIRIN EC 81 MG PO TBEC: 81.0000 mg | DELAYED_RELEASE_TABLET | Freq: Every day | ORAL | 3 refills | Status: AC

## 2021-04-15 MED ORDER — CARVEDILOL 3.125 MG PO TABS: 3.1250 mg | ORAL_TABLET | Freq: Two times a day (BID) | ORAL | 1 refills | Status: DC

## 2021-04-15 NOTE — Progress Notes (Signed)
Cardiology Office Note:    Date:  04/15/2021   ID:  Tanya Mitchell, DOB 02-Dec-1945, MRN [id number]  PCP:  Freada Bergeron, MD   [facility] Providers Cardiologist:  None {  Referring MD: Marda Stalker, PA-C   History of Present Illness:    Tanya Mitchell is a 76 y.o. female with a hx of prior cardiomyopathy secondary to chemo from breast cancer with improved EF, melenoma, HLD and DMII who was previously followed by Truitt Merle, NP who now presents to clinic for follow-up.  Seen in clinic 08/2020 where she was doing well with no anginal or HF symptoms. TTE 09/2020 with LVEF 45-50%, normal RV, normal PASP, severe LAE, trivial MR, no significant aortic disease.  Was seen in clinic on 03/29/21 as an urgent visit for worsening edema, weight gain, fatigue and abdominal distension. TTE with LVEF 25-30%, mild LV dilation, G2DD, mild RV dysfunction, severe LAE, mild MR, moderate pleural effusion and small pericardial effusion. We discussed hospitalization but she wanted to trial out-patient management. We started her on lasix 40mg  BID x5 days and then daily thereafter. We held her coreg and started spiro.   She saw Dr. Irish Lack in follow-up on 03/2021 where she had lost 30lbs. Symptoms had improved significantly and she felt back to baseline. She was resumed on coreg 6.25mg  BID and her lasix was changed to prn.  She is accompanied by her husband. Today, the patient states that she overall feels markedly improved. Prior to her previous fluid retention her weight was about 118 lbs. Currently she is 113 lbs. Overall she is feeling much better. She has not needed to take Lasix recently. No chest pain, lightheadedness, dizziness, orthopnea or PND. Energy levels significantly improved.   She denies any palpitations, chest pain, shortness of breath, or peripheral edema. No lightheadedness, headaches, syncope, orthopnea, or PND.   Past Medical History:  Diagnosis Date   Breast  cancer ([place]) 1995 and 2007   Cardiomyopathy secondary to chemotherapy [facility])    has improved    Hyperlipidemia    Hypertension    under control, has been on med. x 3 yrs.   Melanoma in situ of back [facility]) 09/2011   left   Personal history of chemotherapy    Personal history of radiation therapy    Skin cancer 2013   Melanoma    Past Surgical History:  Procedure Laterality Date   BREAST LUMPECTOMY Right 12/21/2005   right   MASTECTOMY Left 1995   with breast reconstruction - left    Current Medications: Current Meds  Medication Sig   albuterol (VENTOLIN HFA) 108 (90 Base) MCG/ACT inhaler Inhale 1 puff into the lungs as needed.   allopurinol (ZYLOPRIM) 300 MG tablet Take 1 tablet (300 mg total) by mouth daily.   ascorbic acid (VITAMIN C) 500 MG tablet Take 1 tablet by mouth daily.   aspirin EC 81 MG tablet Take 1 tablet (81 mg total) by mouth daily. Swallow whole.   atorvastatin (LIPITOR) 80 MG tablet Take 1 tablet (80 mg total) by mouth daily.   carvedilol (COREG) 3.125 MG tablet Take 1 tablet (3.125 mg total) by mouth 2 (two) times daily.   cholecalciferol (VITAMIN D) 1000 UNITS tablet Take 2,000 Units by mouth daily.    furosemide (LASIX) 40 MG tablet Take 1 tablet (40 mg total) by mouth daily as needed.   glucosamine-chondroitin 500-400 MG tablet Take 1 tablet by mouth daily.   hydroxyurea (HYDREA) 500 MG capsule Take 500 mg  by mouth daily. May take with food to minimize GI side effects. She takes it every other day   Magnesium 400 MG TABS Take 1 tablet by mouth daily as needed. Only take on days taking furosemide   metFORMIN (GLUCOPHAGE) 500 MG tablet 250 mg twice a day.   potassium chloride SA (KLOR-CON M) 20 MEQ tablet Take 1 tablet (20 mEq total) by mouth daily as needed. Only on days taking furosemide   sacubitril-valsartan (ENTRESTO) 24-26 MG Take 1 tablet by mouth 2 (two) times daily.   spironolactone (ALDACTONE) 25 MG tablet Take 1 tablet (25 mg total) by mouth daily.    triamcinolone (KENALOG) 0.025 % ointment Apply topically as needed.   [DISCONTINUED] carvedilol (COREG) 6.25 MG tablet Take 1 tablet (6.25 mg total) by mouth 2 (two) times daily.   [DISCONTINUED] ramipril (ALTACE) 5 MG capsule Take 1 capsule (5 mg total) by mouth daily.     Allergies:   Patient has no known allergies.   Social History   Socioeconomic History   Marital status: Married    Spouse name: Not on file   Number of children: Not on file   Years of education: Not on file   Highest education level: Not on file  Occupational History   Not on file  Tobacco Use   Smoking status: Former    Types: Cigarettes    Quit date: 02/14/1964    Years since quitting: 57.2   Smokeless tobacco: Never  Vaping Use   Vaping Use: Never used  Substance and Sexual Activity   Alcohol use: Yes    Comment: daily glass of wine   Drug use: No   Sexual activity: Yes    Birth control/protection: Post-menopausal  Other Topics Concern   Not on file  Social History Narrative   Not on file   Social Determinants of Health   Financial Resource Strain: Not on file  Food Insecurity: Not on file  Transportation Needs: Not on file  Physical Activity: Not on file  Stress: Not on file  Social Connections: Not on file     Family History: The patient's family history includes Autism in her son; Breast cancer in her daughter; Cancer in her father; Lung cancer in her father.  ROS:   Please see the history of present illness.    Review of Systems  Constitutional:  Negative for chills, fever and malaise/fatigue.  HENT:  Negative for hearing loss.   Eyes:  Negative for blurred vision.  Respiratory:  Negative for shortness of breath.   Cardiovascular:  Negative for chest pain, palpitations, orthopnea, claudication, leg swelling and PND.  Gastrointestinal:  Negative for melena, nausea and vomiting.  Genitourinary:  Negative for hematuria.  Musculoskeletal:  Negative for falls.  Neurological:  Negative  for dizziness and loss of consciousness.  Psychiatric/Behavioral:  Negative for substance abuse.     EKGs/Labs/Other Studies Reviewed:    The following studies were reviewed today:  TTE 03/29/21: 1. Compared to 09/30/20 LV function is worse.   2. Left ventricular ejection fraction, by estimation, is 25 to 30%. The  left ventricle has severely decreased function. The left ventricle  demonstrates global hypokinesis. The left ventricular internal cavity size  was mildly dilated. Left ventricular  diastolic parameters are consistent with Grade II diastolic dysfunction  (pseudonormalization). Elevated left atrial pressure.   3. Right ventricular systolic function is mildly reduced. The right  ventricular size is normal.   4. Left atrial size was severely dilated.   5.  A small pericardial effusion is present. Moderate pleural effusion in  the left lateral region.   6. The mitral valve is normal in structure. Mild mitral valve  regurgitation. No evidence of mitral stenosis.   7. The aortic valve is tricuspid. Aortic valve regurgitation is not  visualized. Aortic valve sclerosis is present, with no evidence of aortic  valve stenosis.   8. The inferior vena cava is normal in size with greater than 50%  respiratory variability, suggesting right atrial pressure of 3 mmHg.   Comparison(s): Prior Ejection Fraction 45-50%.  TTE 11/13/18: IMPRESSIONS   1. Left ventricular ejection fraction, by visual estimation, is 45 to  50%. The left ventricle has mildly decreased function. Normal left  ventricular size. There is no left ventricular hypertrophy.   2. Elevated left ventricular end-diastolic pressure.   3. Left ventricular diastolic Doppler parameters are consistent with  impaired relaxation pattern of LV diastolic filling.   4. Global longitudinal strain abnormal (-11.5%). Very slightly worse than  10/2017.   5. Global right ventricle has normal systolic function.The right  ventricular size is  normal. No increase in right ventricular wall  thickness.   6. Left atrial size was moderately dilated.   7. Right atrial size was normal.   8. The mitral valve is normal in structure. Trace mitral valve  regurgitation. No evidence of mitral stenosis.   9. The tricuspid valve is normal in structure. Tricuspid valve  regurgitation is mild.  10. The aortic valve is normal in structure. Aortic valve regurgitation  was not visualized by color flow Doppler. Structurally normal aortic  valve, with no evidence of sclerosis or stenosis.  11. The pulmonic valve was normal in structure. Pulmonic valve  regurgitation is trivial by color flow Doppler.  12. Mildly elevated pulmonary artery systolic pressure.  13. The inferior vena cava is normal in size with greater than 50%  respiratory variability, suggesting right atrial pressure of 3 mmHg.    EKG:  EKG is personally reviewed. 04/15/2021: EKG was not ordered. 03/29/2021: NSR with 1 degree AVB, poor r-wave progression (similar to prior)  Recent Labs: 02/17/2021: ALT 29 03/29/2021: NT-Pro BNP 8,726 04/04/2021: BUN 19; Creatinine, Ser 0.73; Magnesium 2.0; Potassium 4.1; Sodium 138 04/05/2021: Hemoglobin 9.7; Platelets 179   Recent Lipid Panel    Component Value Date/Time   CHOL 126 01/28/2020 1315   TRIG 190 (H) 01/28/2020 1315   HDL 29 (L) 01/28/2020 1315   CHOLHDL 4.3 01/28/2020 1315   LDLCALC 65 01/28/2020 1315     Risk Assessment/Calculations:           Physical Exam:    VS:  BP (!) 108/52    Pulse 89    Ht 5\' 1"  (1.549 m)    Wt 113 lb 6.4 oz (51.4 kg)    SpO2 97%    BMI 21.43 kg/m     Wt Readings from Last 3 Encounters:  04/15/21 113 lb 6.4 oz (51.4 kg)  04/06/21 107 lb 3.2 oz (48.6 kg)  03/29/21 138 lb 3.2 oz (62.7 kg)     GEN:  Comfortable HEENT: Normal NECK: No JVD CARDIAC: RRR, 2/6 systolic murmur. No rubs, gallops RESPIRATORY:  CTAB ABDOMEN: Soft, non-tender, non-distended MUSCULOSKELETAL:  Trace edema, warm SKIN:  Warm and dry NEUROLOGIC:  Alert and oriented x 3 PSYCHIATRIC:  Normal affect   ASSESSMENT:    1. Chronic systolic heart failure ([place])   2. Medication management   3. Pre-procedure lab exam   4. Essential hypertension  5. NICM (nonischemic cardiomyopathy) ([place])   6. Thrombocytosis   7. Malignant neoplasm of female breast, unspecified estrogen receptor status, unspecified laterality, unspecified site of breast ([place])     PLAN:    In order of problems listed above:  #Acute on Chronic Systolic Heart Failure  #Nonischemic CM: #History of chemotherapy induced CM: Patient with history of chemo induced CM with improved EF to 45-50% on multiple subsequent TTEs. Was doing well until about 2 months ago when she developed progressive fatigue, LE edema and abdominal distension. Saw her oncologist who ordered TTE on 03/29/21 which showed EF 25% global hypokinesis, mild-to-moderate MR, severe LAE, RV mildly enlarged, elevated filling pressures, RAP 43mmHg, small pericardial effusion. She preferred trial of outpatient management. We started lasix with significant improvement in volume status. Lost 30lbs. Symptoms overall significantly imprved. -Continue lasix 40mg  prn for weight gain -Decrease coreg to 3.125mg  BID to allow BP room for entresto -Stop rampril and start entresto 24/26mg  BID -Continue spironolactone 25mg  daily -Add farxiga 10mg  daily at next visit as able -Plan for RHC/LHC now that euvolemic -Low Na diet -Check BMET next week on entresto  #HTN: Controlled.  -Decrease coreg to 3.125mg  BID to allow BP room for entresto -Stop rampril and start entresto 24/26mg  BID -Continue spironolactone 25mg  daily  #HLD: LDL 65 01/2020. -Continue lipitor 80mg  daily  #History of Breast Cancer in remission History of chemo with anthracycline and XRT. Had depressed LVEF but recovered to 45-50%, now back down to 20-25% as detailed above. -Management acute on chronic HF as  above  #Leukocytosis: #Thrombocytosis: -On hydroxyurea  Follow-up:   3 months.  Medication Adjustments/Labs and Tests Ordered: Current medicines are reviewed at length with the patient today.  Concerns regarding medicines are outlined above.   Orders Placed This Encounter  Procedures   Basic metabolic panel   CBC w/Diff   Basic metabolic panel   Meds ordered this encounter  Medications   carvedilol (COREG) 3.125 MG tablet    Sig: Take 1 tablet (3.125 mg total) by mouth 2 (two) times daily.    Dispense:  180 tablet    Refill:  1    Dose decrease   sacubitril-valsartan (ENTRESTO) 24-26 MG    Sig: Take 1 tablet by mouth 2 (two) times daily.    Dispense:  60 tablet    Refill:  3    Please Honor Card patient is presenting for Carmie Kanner: [id number]; Juanna Cao: [id number]; VPXTG: OHS; GYIR: [phone]   aspirin EC 81 MG tablet    Sig: Take 1 tablet (81 mg total) by mouth daily. Swallow whole.    Dispense:  90 tablet    Refill:  3   Patient Instructions  Medication Instructions:   STOP TAKING RAMIPRIL NOW  START TAKING ENTRESTO 24/26 MG --TAKE ONE TABLET BY MOUTH TWICE DAILY  DECREASE YOUR CARVEDILOL TO 3.125 MG BY MOUTH TWICE DAILY  *If you need a refill on your cardiac medications before your next appointment, please call your pharmacy*   Lab Work:  TODAY--BMET, CBC W DIFF  ON Monday 04/25/21 HERE IN OFFICE--ANOTHER BMET  If you have labs (blood work) drawn today and your tests are completely normal, you will receive your results only by: [place] (if you have MyChart) OR A paper copy in the mail If you have any lab test that is abnormal or we need to change your treatment, we will call you to review the results.   Testing/Procedures:  Your physician has requested that you have a  cardiac catheterization. Cardiac catheterization is used to diagnose and/or treat various heart conditions. Doctors may recommend this procedure for a number of different reasons.  The most common reason is to evaluate chest pain. Chest pain can be a symptom of coronary artery disease (CAD), and cardiac catheterization can show whether plaque is narrowing or blocking your hearts arteries. This procedure is also used to evaluate the valves, as well as measure the blood flow and oxygen levels in different parts of your heart. For further information please visit HugeFiesta.[email or web address]. Please follow instruction sheet, as given.    [facility] OFFICE [place], [place] [address] Dept: [phone] Loc: [place]  04/15/2021  You are scheduled for a Cardiac Catheterization on Wednesday, March 8 with Dr. Larae Grooms.  1. Please arrive at the Main Entrance A at [facility]: [place], [place] [address] at 8:00 AM (This time is two hours before your procedure to ensure your preparation). Free valet parking service is available.   Special note: Every effort is made to have your procedure done on time. Please understand that emergencies sometimes delay scheduled procedures.  2. Diet: Do not eat solid foods after midnight.  You may have clear liquids until 5 AM upon the day of the procedure.  3. Labs: You will need to have blood drawn on Friday, March 3 at [facility] at [facility] [address]  Open: 7:30am - 5pm    Phone: [phone]. You do not need to be fasting.  4. Medication instructions in preparation for your procedure:   Contrast Allergy: No   Stop taking, Lasix (Furosemide)  and Fluid pill and potassium  Wednesday, March 8,  DON'T TAKE SPIRONOLACTONE AM OF CATH EITHER   Do not take Diabetes Med Glucophage (Metformin) on the day of the procedure and HOLD 48 HOURS AFTER THE PROCEDURE.  On the morning of your procedure, take Aspirin and any morning medicines NOT listed above.  You may use  sips of water.  5. Plan to go home the same day, you will only stay overnight if medically necessary. 6. You MUST have a responsible adult to drive you home. 7. An adult MUST be with you the first 24 hours after you arrive home. 8. Bring a current list of your medications, and the last time and date medication taken. 9. Bring ID and current insurance cards. 10.Please wear clothes that are easy to get on and off and wear slip-on shoes.  Thank you for allowing [place] to care for you!   -- [place] [facility]   Follow-Up:  3 MONTHS WITH DR. Delmar Landau Stumpf,acting as a scribe for Freada Bergeron, MD.,have documented all relevant documentation on the behalf of Freada Bergeron, MD,as directed by  Freada Bergeron, MD while in the presence of Freada Bergeron, MD.  I, Freada Bergeron, MD, have reviewed all documentation for this visit. The documentation on 04/15/21 for the exam, diagnosis, procedures, and orders are all accurate and complete.    Signed, Freada Bergeron, MD  04/15/2021 11:26 AM    [place]

## 2021-04-15 NOTE — Patient Instructions (Addendum)
Medication Instructions:  ? ?STOP TAKING RAMIPRIL NOW ? ?START TAKING ENTRESTO 24/26 MG --TAKE ONE TABLET BY MOUTH TWICE DAILY ? ?DECREASE YOUR CARVEDILOL TO 3.125 MG BY MOUTH TWICE DAILY ? ?*If you need a refill on your cardiac medications before your next appointment, please call your pharmacy* ? ? ?Lab Work: ? ?TODAY--BMET, CBC W DIFF ? ?ON Monday 04/25/21 HERE IN OFFICE--ANOTHER BMET ? ?If you have labs (blood work) drawn today and your tests are completely normal, you will receive your results only by: ?MyChart Message (if you have MyChart) OR ?A paper copy in the mail ?If you have any lab test that is abnormal or we need to change your treatment, we will call you to review the results. ? ? ?Testing/Procedures: ? ?Your physician has requested that you have a cardiac catheterization. Cardiac catheterization is used to diagnose and/or treat various heart conditions. Doctors may recommend this procedure for a number of different reasons. The most common reason is to evaluate chest pain. Chest pain can be a symptom of coronary artery disease (CAD), and cardiac catheterization can show whether plaque is narrowing or blocking your heart?s arteries. This procedure is also used to evaluate the valves, as well as measure the blood flow and oxygen levels in different parts of your heart. For further information please visit HugeFiesta.[email or web address]. Please follow instruction sheet, as given. ? ? ? ?[place] ?[facility] OFFICE ?[place], [address] ?[place] [address] ?Dept: [phone] ?Loc: [phone] ? ?Tanya Mitchell  04/15/2021 ? ?You are scheduled for a Cardiac Catheterization on Wednesday, March 8 with Dr. Larae Grooms. ? ?1. Please arrive at the Main Entrance A at [facility]: [place], [place] [address] at 8:00 AM (This time is two hours before your procedure to ensure your preparation). Free valet parking  service is available.  ? ?Special note: Every effort is made to have your procedure done on time. Please understand that emergencies sometimes delay scheduled procedures. ? ?2. Diet: Do not eat solid foods after midnight.  You may have clear liquids until 5 AM upon the day of the procedure. ? ?3. Labs: You will need to have blood drawn on Friday, March 3 at [facility] at [facility]. [address], [place]  ?Open: 7:30am - 5pm    Phone: [phone]. You do not need to be fasting. ? ?4. Medication instructions in preparation for your procedure: ? ? Contrast Allergy: No ? ? ?Stop taking, Lasix (Furosemide)  and Fluid pill and potassium  Wednesday, March 8,  DON'T TAKE SPIRONOLACTONE AM OF CATH EITHER ? ? ?Do not take Diabetes Med Glucophage (Metformin) on the day of the procedure and HOLD 48 HOURS AFTER THE PROCEDURE. ? ?On the morning of your procedure, take Aspirin and any morning medicines NOT listed above.  You may use sips of water. ? ?5. Plan to go home the same day, you will only stay overnight if medically necessary. ?6. You MUST have a responsible adult to drive you home. ?7. An adult MUST be with you the first 24 hours after you arrive home. ?8. Bring a current list of your medications, and the last time and date medication taken. ?9. Bring ID and current insurance cards. ?10.Please wear clothes that are easy to get on and off and wear slip-on shoes. ? ?Thank you for allowing [place] to care for you! ?  -- [place] [facility] ? ? ?  Follow-Up: ? ?3 MONTHS WITH DR. Johney Frame ? ?

## 2021-04-15 NOTE — H&P (View-Only) (Signed)
Cardiology Office Note:    Date:  04/15/2021   ID:  Keith Rake Cerullo, DOB [birth year], MRN [id number]  PCP:  Freada Bergeron, MD   [facility] Providers Cardiologist:  None {  Referring MD: Marda Stalker, PA-C   History of Present Illness:    Tanya Mitchell is a 76 y.o. female with a hx of prior cardiomyopathy secondary to chemo from breast cancer with improved EF, melenoma, HLD and DMII who was previously followed by Truitt Merle, NP who now presents to clinic for follow-up.  Seen in clinic 08/2020 where she was doing well with no anginal or HF symptoms. TTE 09/2020 with LVEF 45-50%, normal RV, normal PASP, severe LAE, trivial MR, no significant aortic disease.  Was seen in clinic on 03/29/21 as an urgent visit for worsening edema, weight gain, fatigue and abdominal distension. TTE with LVEF 25-30%, mild LV dilation, G2DD, mild RV dysfunction, severe LAE, mild MR, moderate pleural effusion and small pericardial effusion. We discussed hospitalization but she wanted to trial out-patient management. We started her on lasix 40mg  BID x5 days and then daily thereafter. We held her coreg and started spiro.   She saw Dr. Irish Lack in follow-up on 03/2021 where she had lost 30lbs. Symptoms had improved significantly and she felt back to baseline. She was resumed on coreg 6.25mg  BID and her lasix was changed to prn.  She is accompanied by her husband. Today, the patient states that she overall feels markedly improved. Prior to her previous fluid retention her weight was about 118 lbs. Currently she is 113 lbs. Overall she is feeling much better. She has not needed to take Lasix recently. No chest pain, lightheadedness, dizziness, orthopnea or PND. Energy levels significantly improved.   She denies any palpitations, chest pain, shortness of breath, or peripheral edema. No lightheadedness, headaches, syncope, orthopnea, or PND.   Past Medical History:  Diagnosis Date   Breast  cancer ([place]) 1995 and 2007   Cardiomyopathy secondary to chemotherapy [facility])    has improved    Hyperlipidemia    Hypertension    under control, has been on med. x 3 yrs.   Melanoma in situ of back [facility]) 09/2011   left   Personal history of chemotherapy    Personal history of radiation therapy    Skin cancer 2013   Melanoma    Past Surgical History:  Procedure Laterality Date   BREAST LUMPECTOMY Right 12/21/2005   right   MASTECTOMY Left 1995   with breast reconstruction - left    Current Medications: Current Meds  Medication Sig   albuterol (VENTOLIN HFA) 108 (90 Base) MCG/ACT inhaler Inhale 1 puff into the lungs as needed.   allopurinol (ZYLOPRIM) 300 MG tablet Take 1 tablet (300 mg total) by mouth daily.   ascorbic acid (VITAMIN C) 500 MG tablet Take 1 tablet by mouth daily.   aspirin EC 81 MG tablet Take 1 tablet (81 mg total) by mouth daily. Swallow whole.   atorvastatin (LIPITOR) 80 MG tablet Take 1 tablet (80 mg total) by mouth daily.   carvedilol (COREG) 3.125 MG tablet Take 1 tablet (3.125 mg total) by mouth 2 (two) times daily.   cholecalciferol (VITAMIN D) 1000 UNITS tablet Take 2,000 Units by mouth daily.    furosemide (LASIX) 40 MG tablet Take 1 tablet (40 mg total) by mouth daily as needed.   glucosamine-chondroitin 500-400 MG tablet Take 1 tablet by mouth daily.   hydroxyurea (HYDREA) 500 MG capsule Take 500 mg  by mouth daily. May take with food to minimize GI side effects. She takes it every other day   Magnesium 400 MG TABS Take 1 tablet by mouth daily as needed. Only take on days taking furosemide   metFORMIN (GLUCOPHAGE) 500 MG tablet 250 mg twice a day.   potassium chloride SA (KLOR-CON M) 20 MEQ tablet Take 1 tablet (20 mEq total) by mouth daily as needed. Only on days taking furosemide   sacubitril-valsartan (ENTRESTO) 24-26 MG Take 1 tablet by mouth 2 (two) times daily.   spironolactone (ALDACTONE) 25 MG tablet Take 1 tablet (25 mg total) by mouth daily.    triamcinolone (KENALOG) 0.025 % ointment Apply topically as needed.   [DISCONTINUED] carvedilol (COREG) 6.25 MG tablet Take 1 tablet (6.25 mg total) by mouth 2 (two) times daily.   [DISCONTINUED] ramipril (ALTACE) 5 MG capsule Take 1 capsule (5 mg total) by mouth daily.     Allergies:   Patient has no known allergies.   Social History   Socioeconomic History   Marital status: Married    Spouse name: Not on file   Number of children: Not on file   Years of education: Not on file   Highest education level: Not on file  Occupational History   Not on file  Tobacco Use   Smoking status: Former    Types: Cigarettes    Quit date: 02/14/1964    Years since quitting: 57.2   Smokeless tobacco: Never  Vaping Use   Vaping Use: Never used  Substance and Sexual Activity   Alcohol use: Yes    Comment: daily glass of wine   Drug use: No   Sexual activity: Yes    Birth control/protection: Post-menopausal  Other Topics Concern   Not on file  Social History Narrative   Not on file   Social Determinants of Health   Financial Resource Strain: Not on file  Food Insecurity: Not on file  Transportation Needs: Not on file  Physical Activity: Not on file  Stress: Not on file  Social Connections: Not on file     Family History: The patient's family history includes Autism in her son; Breast cancer in her daughter; Cancer in her father; Lung cancer in her father.  ROS:   Please see the history of present illness.    Review of Systems  Constitutional:  Negative for chills, fever and malaise/fatigue.  HENT:  Negative for hearing loss.   Eyes:  Negative for blurred vision.  Respiratory:  Negative for shortness of breath.   Cardiovascular:  Negative for chest pain, palpitations, orthopnea, claudication, leg swelling and PND.  Gastrointestinal:  Negative for melena, nausea and vomiting.  Genitourinary:  Negative for hematuria.  Musculoskeletal:  Negative for falls.  Neurological:  Negative  for dizziness and loss of consciousness.  Psychiatric/Behavioral:  Negative for substance abuse.     EKGs/Labs/Other Studies Reviewed:    The following studies were reviewed today:  TTE 03/29/21: 1. Compared to 09/30/20 LV function is worse.   2. Left ventricular ejection fraction, by estimation, is 25 to 30%. The  left ventricle has severely decreased function. The left ventricle  demonstrates global hypokinesis. The left ventricular internal cavity size  was mildly dilated. Left ventricular  diastolic parameters are consistent with Grade II diastolic dysfunction  (pseudonormalization). Elevated left atrial pressure.   3. Right ventricular systolic function is mildly reduced. The right  ventricular size is normal.   4. Left atrial size was severely dilated.   5.  A small pericardial effusion is present. Moderate pleural effusion in  the left lateral region.   6. The mitral valve is normal in structure. Mild mitral valve  regurgitation. No evidence of mitral stenosis.   7. The aortic valve is tricuspid. Aortic valve regurgitation is not  visualized. Aortic valve sclerosis is present, with no evidence of aortic  valve stenosis.   8. The inferior vena cava is normal in size with greater than 50%  respiratory variability, suggesting right atrial pressure of 3 mmHg.   Comparison(s): Prior Ejection Fraction 45-50%.  TTE 11/13/18: IMPRESSIONS   1. Left ventricular ejection fraction, by visual estimation, is 45 to  50%. The left ventricle has mildly decreased function. Normal left  ventricular size. There is no left ventricular hypertrophy.   2. Elevated left ventricular end-diastolic pressure.   3. Left ventricular diastolic Doppler parameters are consistent with  impaired relaxation pattern of LV diastolic filling.   4. Global longitudinal strain abnormal (-11.5%). Very slightly worse than  10/2017.   5. Global right ventricle has normal systolic function.The right  ventricular size is  normal. No increase in right ventricular wall  thickness.   6. Left atrial size was moderately dilated.   7. Right atrial size was normal.   8. The mitral valve is normal in structure. Trace mitral valve  regurgitation. No evidence of mitral stenosis.   9. The tricuspid valve is normal in structure. Tricuspid valve  regurgitation is mild.  10. The aortic valve is normal in structure. Aortic valve regurgitation  was not visualized by color flow Doppler. Structurally normal aortic  valve, with no evidence of sclerosis or stenosis.  11. The pulmonic valve was normal in structure. Pulmonic valve  regurgitation is trivial by color flow Doppler.  12. Mildly elevated pulmonary artery systolic pressure.  13. The inferior vena cava is normal in size with greater than 50%  respiratory variability, suggesting right atrial pressure of 3 mmHg.    EKG:  EKG is personally reviewed. 04/15/2021: EKG was not ordered. 03/29/2021: NSR with 1 degree AVB, poor r-wave progression (similar to prior)  Recent Labs: 02/17/2021: ALT 29 03/29/2021: NT-Pro BNP 8,726 04/04/2021: BUN 19; Creatinine, Ser 0.73; Magnesium 2.0; Potassium 4.1; Sodium 138 04/05/2021: Hemoglobin 9.7; Platelets 179   Recent Lipid Panel    Component Value Date/Time   CHOL 126 01/28/2020 1315   TRIG 190 (H) 01/28/2020 1315   HDL 29 (L) 01/28/2020 1315   CHOLHDL 4.3 01/28/2020 1315   LDLCALC 65 01/28/2020 1315     Risk Assessment/Calculations:           Physical Exam:    VS:  BP (!) 108/52    Pulse 89    Ht 5\' 1"  (1.549 m)    Wt 113 lb 6.4 oz (51.4 kg)    SpO2 97%    BMI 21.43 kg/m     Wt Readings from Last 3 Encounters:  04/15/21 113 lb 6.4 oz (51.4 kg)  04/06/21 107 lb 3.2 oz (48.6 kg)  03/29/21 138 lb 3.2 oz (62.7 kg)     GEN:  Comfortable HEENT: Normal NECK: No JVD CARDIAC: RRR, 2/6 systolic murmur. No rubs, gallops RESPIRATORY:  CTAB ABDOMEN: Soft, non-tender, non-distended MUSCULOSKELETAL:  Trace edema, warm SKIN:  Warm and dry NEUROLOGIC:  Alert and oriented x 3 PSYCHIATRIC:  Normal affect   ASSESSMENT:    1. Chronic systolic heart failure ([place])   2. Medication management   3. Pre-procedure lab exam   4. Essential hypertension  5. NICM (nonischemic cardiomyopathy) ([place])   6. Thrombocytosis   7. Malignant neoplasm of female breast, unspecified estrogen receptor status, unspecified laterality, unspecified site of breast ([place])     PLAN:    In order of problems listed above:  #Acute on Chronic Systolic Heart Failure  #Nonischemic CM: #History of chemotherapy induced CM: Patient with history of chemo induced CM with improved EF to 45-50% on multiple subsequent TTEs. Was doing well until about 2 months ago when she developed progressive fatigue, LE edema and abdominal distension. Saw her oncologist who ordered TTE on 03/29/21 which showed EF 25% global hypokinesis, mild-to-moderate MR, severe LAE, RV mildly enlarged, elevated filling pressures, RAP 68mmHg, small pericardial effusion. She preferred trial of outpatient management. We started lasix with significant improvement in volume status. Lost 30lbs. Symptoms overall significantly imprved. -Continue lasix 40mg  prn for weight gain -Decrease coreg to 3.125mg  BID to allow BP room for entresto -Stop rampril and start entresto 24/26mg  BID -Continue spironolactone 25mg  daily -Add farxiga 10mg  daily at next visit as able -Plan for RHC/LHC now that euvolemic -Low Na diet -Check BMET next week on entresto  #HTN: Controlled.  -Decrease coreg to 3.125mg  BID to allow BP room for entresto -Stop rampril and start entresto 24/26mg  BID -Continue spironolactone 25mg  daily  #HLD: LDL 65 01/2020. -Continue lipitor 80mg  daily  #History of Breast Cancer in remission History of chemo with anthracycline and XRT. Had depressed LVEF but recovered to 45-50%, now back down to 20-25% as detailed above. -Management acute on chronic HF as  above  #Leukocytosis: #Thrombocytosis: -On hydroxyurea  Follow-up:   3 months.  Medication Adjustments/Labs and Tests Ordered: Current medicines are reviewed at length with the patient today.  Concerns regarding medicines are outlined above.   Orders Placed This Encounter  Procedures   Basic metabolic panel   CBC w/Diff   Basic metabolic panel   Meds ordered this encounter  Medications   carvedilol (COREG) 3.125 MG tablet    Sig: Take 1 tablet (3.125 mg total) by mouth 2 (two) times daily.    Dispense:  180 tablet    Refill:  1    Dose decrease   sacubitril-valsartan (ENTRESTO) 24-26 MG    Sig: Take 1 tablet by mouth 2 (two) times daily.    Dispense:  60 tablet    Refill:  3    Please Honor Card patient is presenting for Carmie Kanner: [id number]; Juanna Cao: [id number]; BJYNW: OHS; GNFA: [phone]   aspirin EC 81 MG tablet    Sig: Take 1 tablet (81 mg total) by mouth daily. Swallow whole.    Dispense:  90 tablet    Refill:  3   Patient Instructions  Medication Instructions:   STOP TAKING RAMIPRIL NOW  START TAKING ENTRESTO 24/26 MG --TAKE ONE TABLET BY MOUTH TWICE DAILY  DECREASE YOUR CARVEDILOL TO 3.125 MG BY MOUTH TWICE DAILY  *If you need a refill on your cardiac medications before your next appointment, please call your pharmacy*   Lab Work:  TODAY--BMET, CBC W DIFF  ON Monday 04/25/21 HERE IN OFFICE--ANOTHER BMET  If you have labs (blood work) drawn today and your tests are completely normal, you will receive your results only by: [place] [facility] (if you have MyChart) OR A paper copy in the mail If you have any lab test that is abnormal or we need to change your treatment, we will call you to review the results.   Testing/Procedures:  Your physician has requested that you have a  cardiac catheterization. Cardiac catheterization is used to diagnose and/or treat various heart conditions. Doctors may recommend this procedure for a number of different reasons.  The most common reason is to evaluate chest pain. Chest pain can be a symptom of coronary artery disease (CAD), and cardiac catheterization can show whether plaque is narrowing or blocking your hearts arteries. This procedure is also used to evaluate the valves, as well as measure the blood flow and oxygen levels in different parts of your heart. For further information please visit [email or web address][email or web address]. Please follow instruction sheet, as given.    [place] OFFICE [place], [place] [address] Dept: [phone] Loc: [place]  04/15/2021  You are scheduled for a Cardiac Catheterization on Wednesday, March 8 with Dr. Larae Grooms.  1. Please arrive at the Main Entrance A at [facility]: [place], [place] [address] at 8:00 AM (This time is two hours before your procedure to ensure your preparation). Free valet parking service is available.   Special note: Every effort is made to have your procedure done on time. Please understand that emergencies sometimes delay scheduled procedures.  2. Diet: Do not eat solid foods after midnight.  You may have clear liquids until 5 AM upon the day of the procedure.  3. Labs: You will need to have blood drawn on Friday, March 3 at [facility] at [facility]. [address]  Open: 7:30am - 5pm    Phone: [phone]. You do not need to be fasting.  4. Medication instructions in preparation for your procedure:   Contrast Allergy: No   Stop taking, Lasix (Furosemide)  and Fluid pill and potassium  Wednesday, March 8,  DON'T TAKE SPIRONOLACTONE AM OF CATH EITHER   Do not take Diabetes Med Glucophage (Metformin) on the day of the procedure and HOLD 48 HOURS AFTER THE PROCEDURE.  On the morning of your procedure, take Aspirin and any morning medicines NOT listed above.  You may use  sips of water.  5. Plan to go home the same day, you will only stay overnight if medically necessary. 6. You MUST have a responsible adult to drive you home. 7. An adult MUST be with you the first 24 hours after you arrive home. 8. Bring a current list of your medications, and the last time and date medication taken. 9. Bring ID and current insurance cards. 10.Please wear clothes that are easy to get on and off and wear slip-on shoes.  Thank you for allowing [place] to care for you!   -- [facility]   Follow-Up:  3 MONTHS WITH DR. Delmar Landau Stumpf,acting as a scribe for Freada Bergeron, MD.,have documented all relevant documentation on the behalf of Freada Bergeron, MD,as directed by  Freada Bergeron, MD while in the presence of Freada Bergeron, MD.  I, Freada Bergeron, MD, have reviewed all documentation for this visit. The documentation on 04/15/21 for the exam, diagnosis, procedures, and orders are all accurate and complete.    Signed, Freada Bergeron, MD  04/15/2021 11:26 AM    [place]

## 2021-04-16 LAB — BASIC METABOLIC PANEL
BUN/Creatinine Ratio: 19 (ref 12–28)
BUN: 12 mg/dL (ref 8–27)
CO2: 24 mmol/L (ref 20–29)
Calcium: 9.5 mg/dL (ref 8.7–10.3)
Chloride: 98 mmol/L (ref 96–106)
Creatinine, Ser: 0.62 mg/dL (ref 0.57–1.00)
Glucose: 125 mg/dL — ABNORMAL HIGH (ref 70–99)
Potassium: 4.4 mmol/L (ref 3.5–5.2)
Sodium: 136 mmol/L (ref 134–144)
eGFR: 92 mL/min/{1.73_m2} (ref >59)

## 2021-04-16 LAB — IMMATURE CELLS
Bands(Auto) Relative: 1 %
MYELOCYTES: 1 % — ABNORMAL HIGH (ref 0–0)
Metamyelocytes: 2 % — ABNORMAL HIGH (ref 0–0)

## 2021-04-16 LAB — CBC WITH DIFFERENTIAL/PLATELET
Basophils Absolute: 0 10*3/uL (ref 0.0–0.2)
Basos: 0 %
EOS (ABSOLUTE): 0 10*3/uL (ref 0.0–0.4)
Eos: 0 %
Hematocrit: 23 % — ABNORMAL LOW (ref 34.0–46.6)
Hemoglobin: 8.2 g/dL — ABNORMAL LOW (ref 11.1–15.9)
Lymphocytes Absolute: 1.8 10*3/uL (ref 0.7–3.1)
Lymphs: 3 %
MCH: 36.8 pg — ABNORMAL HIGH (ref 26.6–33.0)
MCHC: 35.7 g/dL (ref 31.5–35.7)
MCV: 103 fL — ABNORMAL HIGH (ref 79–97)
Monocytes Absolute: 0.6 10*3/uL (ref 0.1–0.9)
Monocytes: 1 %
Neutrophils Absolute: 56.9 10*3/uL — ABNORMAL HIGH (ref 1.4–7.0)
Neutrophils: 92 %
Platelets: 171 10*3/uL (ref 150–450)
RBC: 2.23 x10E6/uL — CL (ref 3.77–5.28)
RDW: 20.5 % — ABNORMAL HIGH (ref 11.7–15.4)
WBC: 61.2 10*3/uL (ref 3.4–10.8)

## 2021-04-18 ENCOUNTER — Telehealth: Payer: Self-pay | Admitting: Cardiology

## 2021-04-18 NOTE — Telephone Encounter (Signed)
**Note De-Identified Annel Zunker Obfuscation** Entresto PA started through covermymeds. ?Key: [id number] ?

## 2021-04-18 NOTE — Telephone Encounter (Signed)
Patient called stating that she was prescribed this morning entresto. They [facility] needs a prior auth for this medication.  ? ?

## 2021-04-18 NOTE — Telephone Encounter (Signed)
Will send this message to our prior auth Nurse to further assist the pt with new start entresto Dr. Johney Frame prescribed at last OV.  ?

## 2021-04-19 ENCOUNTER — Telehealth: Payer: Self-pay | Admitting: *Deleted

## 2021-04-19 ENCOUNTER — Other Ambulatory Visit: Payer: Self-pay

## 2021-04-19 ENCOUNTER — Inpatient Hospital Stay: Payer: Medicare Other

## 2021-04-19 ENCOUNTER — Inpatient Hospital Stay: Payer: Medicare Other | Attending: Oncology

## 2021-04-19 VITALS — BP 88/50 | HR 91 | Temp 98.1°F

## 2021-04-19 DIAGNOSIS — D46Z Other myelodysplastic syndromes: Secondary | ICD-10-CM | POA: Diagnosis not present

## 2021-04-19 DIAGNOSIS — D75839 Thrombocytosis, unspecified: Secondary | ICD-10-CM

## 2021-04-19 DIAGNOSIS — I428 Other cardiomyopathies: Secondary | ICD-10-CM

## 2021-04-19 DIAGNOSIS — D471 Chronic myeloproliferative disease: Secondary | ICD-10-CM

## 2021-04-19 DIAGNOSIS — C4359 Malignant melanoma of other part of trunk: Secondary | ICD-10-CM

## 2021-04-19 DIAGNOSIS — D469 Myelodysplastic syndrome, unspecified: Secondary | ICD-10-CM

## 2021-04-19 DIAGNOSIS — D72829 Elevated white blood cell count, unspecified: Secondary | ICD-10-CM

## 2021-04-19 LAB — CBC WITH DIFFERENTIAL/PLATELET
Abs Immature Granulocytes: 7.55 10*3/uL — ABNORMAL HIGH (ref 0.00–0.07)
Basophils Absolute: 0 10*3/uL (ref 0.0–0.1)
Basophils Relative: 0 %
Eosinophils Absolute: 0 10*3/uL (ref 0.0–0.5)
Eosinophils Relative: 0 %
HCT: 26 % — ABNORMAL LOW (ref 36.0–46.0)
Hemoglobin: 8.9 g/dL — ABNORMAL LOW (ref 12.0–15.0)
Immature Granulocytes: 14 %
Lymphocytes Relative: 5 %
Lymphs Abs: 2.5 10*3/uL (ref 0.7–4.0)
MCH: 37.2 pg — ABNORMAL HIGH (ref 26.0–34.0)
MCHC: 34.2 g/dL (ref 30.0–36.0)
MCV: 108.8 fL — ABNORMAL HIGH (ref 80.0–100.0)
Monocytes Absolute: 1 10*3/uL (ref 0.1–1.0)
Monocytes Relative: 2 %
Neutro Abs: 43.2 10*3/uL — ABNORMAL HIGH (ref 1.7–7.7)
Neutrophils Relative %: 79 %
Platelets: 208 10*3/uL (ref 150–400)
RBC: 2.39 MIL/uL — ABNORMAL LOW (ref 3.87–5.11)
RDW: 24.3 % — ABNORMAL HIGH (ref 11.5–15.5)
Smear Review: NORMAL
WBC: 54.3 10*3/uL (ref 4.0–10.5)
nRBC: 0 % (ref 0.0–0.2)

## 2021-04-19 LAB — RETICULOCYTES
Immature Retic Fract: 30.5 % — ABNORMAL HIGH (ref 2.3–15.9)
RBC.: 2.36 MIL/uL — ABNORMAL LOW (ref 3.87–5.11)
Retic Count, Absolute: 40.8 10*3/uL (ref 19.0–186.0)
Retic Ct Pct: 1.7 % (ref 0.4–3.1)

## 2021-04-19 LAB — SAMPLE TO BLOOD BANK

## 2021-04-19 LAB — FERRITIN: Ferritin: 1309 ng/mL — ABNORMAL HIGH (ref 11–307)

## 2021-04-19 LAB — SAVE SMEAR(SSMR), FOR PROVIDER SLIDE REVIEW

## 2021-04-19 MED ORDER — DARBEPOETIN ALFA 300 MCG/0.6ML IJ SOSY
300.0000 ug | PREFILLED_SYRINGE | Freq: Once | INTRAMUSCULAR | Status: AC
  Administered 2021-04-19: 300 ug via SUBCUTANEOUS
  Filled 2021-04-19: qty 0.6

## 2021-04-19 NOTE — Telephone Encounter (Signed)
Pt contacted pre-catheterization scheduled at [facility] for: Wednesday April 20, 2021 10 AM ?Verified arrival time and place: [place] Entrance A  at: 8 AM ? ? ?No solid food after midnight prior to cath, clear liquids until 5 AM day of procedure. ? ?Medication instructions: ?-Hold: ? Lasix/KCl/Spironolactone -AM of procedure ? Metformin-day of procedure and 48 hours post procedure ?-Except hold medications usual morning medications can be taken with sips of water including aspirin 81 mg. ? ?Confirmed patient has responsible adult to drive home post procedure and be with patient first 24 hours after arriving home: ? ?One visitor is allowed to stay in the waiting room during the time you are at the hospital for your procedure.  ? ? Reviewed procedure instructions with patient.  ? ?

## 2021-04-19 NOTE — Telephone Encounter (Signed)
Great! Thank you so much!

## 2021-04-19 NOTE — Telephone Encounter (Signed)
04/15/21 Hgb 8.2 -04/19/21 Hgb 8.9 ? ?Patient reports infusion (not sure name if infusion) earlier today at [facility], reports staff there aware of cath tomorrow. ?Patient reports she was told since she had infusion earlier today, her Hgb should be okay for cath tomorrow. ?

## 2021-04-19 NOTE — Telephone Encounter (Signed)
**Note De-Identified Ghazal Pevey Obfuscation** Per message from [place] canceled this [facility] PA because it is covered by the pts plan. ?I have made [place] aware of this. ? ?I called [facility] and s/w the pharmacist who states that the pts mail order pharmacy got the pts Entresto RX request from [place] before they did so her ins would not cover a 30 day supply that was e-scribed to them Chartered loss adjuster). ?The pharmacist states that she called the pts ins plan and got the RX that was e-scribed to them approved. ? ?I thanked her for her for assisting the pt in getting her Entresto filled. ?

## 2021-04-20 ENCOUNTER — Ambulatory Visit (HOSPITAL_COMMUNITY)
Admission: RE | Admit: 2021-04-20 | Discharge: 2021-04-20 | Disposition: A | Discharge status: Home / Self Care | Payer: Medicare Other | Attending: Interventional Cardiology | Admitting: Interventional Cardiology

## 2021-04-20 ENCOUNTER — Other Ambulatory Visit: Payer: Self-pay

## 2021-04-20 ENCOUNTER — Encounter (HOSPITAL_COMMUNITY)
Admission: RE | Disposition: A | Discharge status: Home / Self Care | Payer: Self-pay | Source: Home / Self Care | Attending: Interventional Cardiology

## 2021-04-20 DIAGNOSIS — Z853 Personal history of malignant neoplasm of breast: Secondary | ICD-10-CM | POA: Insufficient documentation

## 2021-04-20 DIAGNOSIS — Z87891 Personal history of nicotine dependence: Secondary | ICD-10-CM | POA: Insufficient documentation

## 2021-04-20 DIAGNOSIS — I110 Hypertensive heart disease with heart failure: Secondary | ICD-10-CM | POA: Insufficient documentation

## 2021-04-20 DIAGNOSIS — I5023 Acute on chronic systolic (congestive) heart failure: Secondary | ICD-10-CM | POA: Diagnosis not present

## 2021-04-20 DIAGNOSIS — Z79899 Other long term (current) drug therapy: Secondary | ICD-10-CM | POA: Insufficient documentation

## 2021-04-20 DIAGNOSIS — D72829 Elevated white blood cell count, unspecified: Secondary | ICD-10-CM | POA: Diagnosis not present

## 2021-04-20 DIAGNOSIS — Z7982 Long term (current) use of aspirin: Secondary | ICD-10-CM | POA: Diagnosis not present

## 2021-04-20 DIAGNOSIS — E119 Type 2 diabetes mellitus without complications: Secondary | ICD-10-CM | POA: Diagnosis not present

## 2021-04-20 DIAGNOSIS — I428 Other cardiomyopathies: Secondary | ICD-10-CM | POA: Insufficient documentation

## 2021-04-20 DIAGNOSIS — E785 Hyperlipidemia, unspecified: Secondary | ICD-10-CM | POA: Diagnosis not present

## 2021-04-20 DIAGNOSIS — Z7984 Long term (current) use of oral hypoglycemic drugs: Secondary | ICD-10-CM | POA: Insufficient documentation

## 2021-04-20 DIAGNOSIS — Z9221 Personal history of antineoplastic chemotherapy: Secondary | ICD-10-CM | POA: Diagnosis not present

## 2021-04-20 DIAGNOSIS — D75839 Thrombocytosis, unspecified: Secondary | ICD-10-CM | POA: Insufficient documentation

## 2021-04-20 DIAGNOSIS — I5021 Acute systolic (congestive) heart failure: Secondary | ICD-10-CM

## 2021-04-20 HISTORY — PX: RIGHT/LEFT HEART CATH AND CORONARY ANGIOGRAPHY: CATH118266

## 2021-04-20 LAB — POCT I-STAT EG7
Acid-Base Excess: 2 mmol/L (ref 0.0–2.0)
Acid-base deficit: 2 mmol/L (ref 0.0–2.0)
Bicarbonate: 22.8 mmol/L (ref 20.0–28.0)
Bicarbonate: 26.7 mmol/L (ref 20.0–28.0)
Calcium, Ion: 1.07 mmol/L — ABNORMAL LOW (ref 1.15–1.40)
Calcium, Ion: 1.27 mmol/L (ref 1.15–1.40)
HCT: 24 % — ABNORMAL LOW (ref 36.0–46.0)
HCT: 29 % — ABNORMAL LOW (ref 36.0–46.0)
Hemoglobin: 8.2 g/dL — ABNORMAL LOW (ref 12.0–15.0)
Hemoglobin: 9.9 g/dL — ABNORMAL LOW (ref 12.0–15.0)
O2 Saturation: 71 %
O2 Saturation: 71 %
Potassium: 3.2 mmol/L — ABNORMAL LOW (ref 3.5–5.1)
Potassium: 3.7 mmol/L (ref 3.5–5.1)
Sodium: 140 mmol/L (ref 135–145)
Sodium: 143 mmol/L (ref 135–145)
TCO2: 24 mmol/L (ref 22–32)
TCO2: 28 mmol/L (ref 22–32)
pCO2, Ven: 37.9 mmHg — ABNORMAL LOW (ref 44–60)
pCO2, Ven: 42.4 mmHg — ABNORMAL LOW (ref 44–60)
pH, Ven: 7.388 (ref 7.25–7.43)
pH, Ven: 7.407 (ref 7.25–7.43)
pO2, Ven: 37 mmHg (ref 32–45)
pO2, Ven: 38 mmHg (ref 32–45)

## 2021-04-20 LAB — CBC
HCT: 25.3 % — ABNORMAL LOW (ref 36.0–46.0)
Hemoglobin: 8.7 g/dL — ABNORMAL LOW (ref 12.0–15.0)
MCH: 38.2 pg — ABNORMAL HIGH (ref 26.0–34.0)
MCHC: 34.4 g/dL (ref 30.0–36.0)
MCV: 111 fL — ABNORMAL HIGH (ref 80.0–100.0)
Platelets: 211 10*3/uL (ref 150–400)
RBC: 2.28 MIL/uL — ABNORMAL LOW (ref 3.87–5.11)
RDW: 24.5 % — ABNORMAL HIGH (ref 11.5–15.5)
WBC: 62.5 10*3/uL (ref 4.0–10.5)
nRBC: 0 % (ref 0.0–0.2)

## 2021-04-20 LAB — POCT I-STAT 7, (LYTES, BLD GAS, ICA,H+H)
Acid-Base Excess: 1 mmol/L (ref 0.0–2.0)
Bicarbonate: 24.9 mmol/L (ref 20.0–28.0)
Calcium, Ion: 1.24 mmol/L (ref 1.15–1.40)
HCT: 27 % — ABNORMAL LOW (ref 36.0–46.0)
Hemoglobin: 9.2 g/dL — ABNORMAL LOW (ref 12.0–15.0)
O2 Saturation: 100 %
Potassium: 3.6 mmol/L (ref 3.5–5.1)
Sodium: 140 mmol/L (ref 135–145)
TCO2: 26 mmol/L (ref 22–32)
pCO2 arterial: 36.7 mmHg (ref 32–48)
pH, Arterial: 7.439 (ref 7.35–7.45)
pO2, Arterial: 170 mmHg — ABNORMAL HIGH (ref 83–108)

## 2021-04-20 SURGERY — RIGHT/LEFT HEART CATH AND CORONARY ANGIOGRAPHY
Anesthesia: LOCAL

## 2021-04-20 MED ORDER — ASPIRIN 81 MG PO CHEW: 81.0000 mg | CHEWABLE_TABLET | ORAL | Status: DC

## 2021-04-20 MED ORDER — FENTANYL CITRATE (PF) 100 MCG/2ML IJ SOLN
INTRAMUSCULAR | Status: AC
  Filled 2021-04-20: qty 2

## 2021-04-20 MED ORDER — METFORMIN HCL 500 MG PO TABS: 250.0000 mg | ORAL_TABLET | Freq: Two times a day (BID) | ORAL | Status: AC

## 2021-04-20 MED ORDER — FENTANYL CITRATE (PF) 100 MCG/2ML IJ SOLN
INTRAMUSCULAR | Status: DC | PRN
  Administered 2021-04-20: 25 ug via INTRAVENOUS

## 2021-04-20 MED ORDER — ACETAMINOPHEN 325 MG PO TABS: 650.0000 mg | ORAL_TABLET | ORAL | Status: DC | PRN

## 2021-04-20 MED ORDER — SODIUM CHLORIDE 0.9% FLUSH: 3.0000 mL | Freq: Two times a day (BID) | INTRAVENOUS | Status: DC

## 2021-04-20 MED ORDER — LABETALOL HCL 5 MG/ML IV SOLN: 10.0000 mg | INTRAVENOUS | Status: DC | PRN

## 2021-04-20 MED ORDER — LIDOCAINE HCL (PF) 1 % IJ SOLN
INTRAMUSCULAR | Status: AC
  Filled 2021-04-20: qty 30

## 2021-04-20 MED ORDER — HEPARIN SODIUM (PORCINE) 1000 UNIT/ML IJ SOLN
INTRAMUSCULAR | Status: DC | PRN
  Administered 2021-04-20: 2500 [IU] via INTRAVENOUS

## 2021-04-20 MED ORDER — MIDAZOLAM HCL 2 MG/2ML IJ SOLN
INTRAMUSCULAR | Status: DC | PRN
  Administered 2021-04-20 (×2): 1 mg via INTRAVENOUS

## 2021-04-20 MED ORDER — IOHEXOL 350 MG/ML SOLN
INTRAVENOUS | Status: DC | PRN
  Administered 2021-04-20: 30 mL

## 2021-04-20 MED ORDER — ONDANSETRON HCL 4 MG/2ML IJ SOLN: 4.0000 mg | Freq: Four times a day (QID) | INTRAMUSCULAR | Status: DC | PRN

## 2021-04-20 MED ORDER — HEPARIN (PORCINE) IN NACL 1000-0.9 UT/500ML-% IV SOLN
INTRAVENOUS | Status: AC
  Filled 2021-04-20: qty 1000

## 2021-04-20 MED ORDER — SODIUM CHLORIDE 0.9% FLUSH: 3.0000 mL | INTRAVENOUS | Status: DC | PRN

## 2021-04-20 MED ORDER — HYDRALAZINE HCL 20 MG/ML IJ SOLN: 10.0000 mg | INTRAMUSCULAR | Status: DC | PRN

## 2021-04-20 MED ORDER — SODIUM CHLORIDE 0.9 % IV SOLN: INTRAVENOUS | Status: DC

## 2021-04-20 MED ORDER — SODIUM CHLORIDE 0.9 % IV SOLN: 250.0000 mL | INTRAVENOUS | Status: DC | PRN

## 2021-04-20 MED ORDER — HEPARIN (PORCINE) IN NACL 1000-0.9 UT/500ML-% IV SOLN
INTRAVENOUS | Status: DC | PRN
Start: 2021-04-20 — End: 2021-04-20
  Administered 2021-04-20 (×2): 500 mL

## 2021-04-20 MED ORDER — VERAPAMIL HCL 2.5 MG/ML IV SOLN
INTRAVENOUS | Status: AC
  Filled 2021-04-20: qty 2

## 2021-04-20 MED ORDER — VERAPAMIL HCL 2.5 MG/ML IV SOLN
INTRAVENOUS | Status: DC | PRN
  Administered 2021-04-20: 10 mL via INTRA_ARTERIAL

## 2021-04-20 MED ORDER — LIDOCAINE HCL (PF) 1 % IJ SOLN
INTRAMUSCULAR | Status: DC | PRN
  Administered 2021-04-20 (×2): 2 mL

## 2021-04-20 MED ORDER — MIDAZOLAM HCL 2 MG/2ML IJ SOLN
INTRAMUSCULAR | Status: AC
  Filled 2021-04-20: qty 2

## 2021-04-20 SURGICAL SUPPLY — 12 items
CATH 5FR JL3.5 JR4 ANG PIG MP (CATHETERS) ×1 IMPLANT
CATH BALLN WEDGE 5F 110CM (CATHETERS) ×1 IMPLANT
DEVICE RAD TR BAND REGULAR (VASCULAR PRODUCTS) ×1 IMPLANT
GLIDESHEATH SLEND SS 6F .021 (SHEATH) ×1 IMPLANT
GUIDEWIRE INQWIRE 1.5J.035X260 (WIRE) IMPLANT
INQWIRE 1.5J .035X260CM (WIRE) ×4
KIT HEART LEFT (KITS) ×2 IMPLANT
PACK CARDIAC CATHETERIZATION (CUSTOM PROCEDURE TRAY) ×2 IMPLANT
SHEATH GLIDE SLENDER 4/5FR (SHEATH) ×1 IMPLANT
SHEATH PROBE COVER 6X72 (BAG) ×1 IMPLANT
TRANSDUCER W/STOPCOCK (MISCELLANEOUS) ×2 IMPLANT
TUBING CIL FLEX 10 FLL-RA (TUBING) ×2 IMPLANT

## 2021-04-20 NOTE — Progress Notes (Signed)
CRITICAL RESULT PROVIDER NOTIFICATION ? ?Test performed and critical result:  cbc wbc 62.5 ? ?Date and time result received: 3/8 1045 ? ?Provider name/title: Dr  Irish Lack ? ?Date and time provider notified: 3/8 1045 ? ?Date and time provider responded: 3/8  1045 ? ?Provider response:No new orders ? ? ? ?

## 2021-04-20 NOTE — Interval H&P Note (Signed)
Cath Lab Visit (complete for each Cath Lab visit) ? ?Clinical Evaluation Leading to the Procedure:  ? ?ACS: No. ? ?Non-ACS:   ? ?Anginal Classification: CCS III ? ?Anti-ischemic medical therapy: Minimal Therapy (1 class of medications) ? ?Non-Invasive Test Results: High-risk stress test findings: cardiac mortality >3%/year ? ?Prior CABG: No previous CABG ? ? ?Low EF ? ? ?History and Physical Interval Note: ? ?04/20/2021 ?10:37 AM ? ?Tanya Mitchell  has presented today for surgery, with the diagnosis of heart failure.  The various methods of treatment have been discussed with the patient and family. After consideration of risks, benefits and other options for treatment, the patient has consented to  Procedure(s): ?RIGHT/LEFT HEART CATH AND CORONARY ANGIOGRAPHY (N/A) as a surgical intervention.  The patient's history has been reviewed, patient examined, no change in status, stable for surgery.  I have reviewed the patient's chart and labs.  Questions were answered to the patient's satisfaction.   ? ? ?Larae Grooms ? ? ?

## 2021-04-20 NOTE — Discharge Instructions (Signed)
HOLD METFORMIN FOR A FULL 48 HOURS AFTER DISCHARGE. 

## 2021-04-21 ENCOUNTER — Encounter (HOSPITAL_COMMUNITY): Payer: Self-pay | Admitting: Interventional Cardiology

## 2021-04-25 ENCOUNTER — Other Ambulatory Visit: Payer: Self-pay

## 2021-04-25 ENCOUNTER — Other Ambulatory Visit: Payer: Medicare Other

## 2021-04-25 DIAGNOSIS — Z79899 Other long term (current) drug therapy: Secondary | ICD-10-CM

## 2021-04-25 DIAGNOSIS — I5022 Chronic systolic (congestive) heart failure: Secondary | ICD-10-CM | POA: Diagnosis not present

## 2021-04-25 DIAGNOSIS — Z01812 Encounter for preprocedural laboratory examination: Secondary | ICD-10-CM | POA: Diagnosis not present

## 2021-04-25 LAB — BASIC METABOLIC PANEL
BUN/Creatinine Ratio: 31 — ABNORMAL HIGH (ref 12–28)
BUN: 22 mg/dL (ref 8–27)
CO2: 22 mmol/L (ref 20–29)
Calcium: 9.4 mg/dL (ref 8.7–10.3)
Chloride: 103 mmol/L (ref 96–106)
Creatinine, Ser: 0.72 mg/dL (ref 0.57–1.00)
Glucose: 163 mg/dL — ABNORMAL HIGH (ref 70–99)
Potassium: 4.8 mmol/L (ref 3.5–5.2)
Sodium: 138 mmol/L (ref 134–144)
eGFR: 87 mL/min/{1.73_m2} (ref >59)

## 2021-04-27 ENCOUNTER — Telehealth: Payer: Self-pay | Admitting: *Deleted

## 2021-04-27 NOTE — Telephone Encounter (Signed)
This RN called pt and obtained identified VM- message left. ?

## 2021-04-27 NOTE — Telephone Encounter (Signed)
-----   Message from Benay Pike, MD sent at 04/26/2021  7:29 PM EDT ----- ?Val ? ?Can you talk to her and see if she needs to be seen sooner? ? ?Thanks ?----- Message ----- ?From: Nuala Alpha, LPN ?Sent: 04/18/2021   8:21 AM EDT ?To: Benay Pike, MD ? ?Pts lab results from our office.  She will see you tomorrow ? ?

## 2021-05-02 DIAGNOSIS — M25541 Pain in joints of right hand: Secondary | ICD-10-CM | POA: Diagnosis not present

## 2021-05-02 DIAGNOSIS — M779 Enthesopathy, unspecified: Secondary | ICD-10-CM | POA: Diagnosis not present

## 2021-05-03 ENCOUNTER — Other Ambulatory Visit: Payer: Self-pay | Admitting: *Deleted

## 2021-05-03 ENCOUNTER — Inpatient Hospital Stay (HOSPITAL_BASED_OUTPATIENT_CLINIC_OR_DEPARTMENT_OTHER): Payer: Medicare Other

## 2021-05-03 ENCOUNTER — Inpatient Hospital Stay: Payer: Medicare Other

## 2021-05-03 ENCOUNTER — Other Ambulatory Visit: Payer: Self-pay

## 2021-05-03 VITALS — BP 99/50 | HR 81 | Temp 98.0°F | Resp 17

## 2021-05-03 DIAGNOSIS — D469 Myelodysplastic syndrome, unspecified: Secondary | ICD-10-CM

## 2021-05-03 DIAGNOSIS — D471 Chronic myeloproliferative disease: Secondary | ICD-10-CM

## 2021-05-03 DIAGNOSIS — D46Z Other myelodysplastic syndromes: Secondary | ICD-10-CM | POA: Diagnosis not present

## 2021-05-03 LAB — CBC WITH DIFFERENTIAL (CANCER CENTER ONLY)
Abs Immature Granulocytes: 8.91 10*3/uL — ABNORMAL HIGH (ref 0.00–0.07)
Basophils Absolute: 0.1 10*3/uL (ref 0.0–0.1)
Basophils Relative: 0 %
Eosinophils Absolute: 0 10*3/uL (ref 0.0–0.5)
Eosinophils Relative: 0 %
HCT: 19.8 % — ABNORMAL LOW (ref 36.0–46.0)
Hemoglobin: 6.7 g/dL — CL (ref 12.0–15.0)
Immature Granulocytes: 17 %
Lymphocytes Relative: 7 %
Lymphs Abs: 3.6 10*3/uL (ref 0.7–4.0)
MCH: 38.3 pg — ABNORMAL HIGH (ref 26.0–34.0)
MCHC: 33.8 g/dL (ref 30.0–36.0)
MCV: 113.1 fL — ABNORMAL HIGH (ref 80.0–100.0)
Monocytes Absolute: 0.3 10*3/uL (ref 0.1–1.0)
Monocytes Relative: 1 %
Neutro Abs: 39.1 10*3/uL — ABNORMAL HIGH (ref 1.7–7.7)
Neutrophils Relative %: 75 %
Platelet Count: 138 10*3/uL — ABNORMAL LOW (ref 150–400)
RBC: 1.75 MIL/uL — ABNORMAL LOW (ref 3.87–5.11)
RDW: 24.9 % — ABNORMAL HIGH (ref 11.5–15.5)
Smear Review: NORMAL
WBC Count: 52.1 10*3/uL (ref 4.0–10.5)
nRBC: 0 % (ref 0.0–0.2)

## 2021-05-03 LAB — CMP (CANCER CENTER ONLY)
ALT: 30 U/L (ref 0–44)
AST: 26 U/L (ref 15–41)
Albumin: 3.6 g/dL (ref 3.5–5.0)
Alkaline Phosphatase: 113 U/L (ref 38–126)
Anion gap: 6 (ref 5–15)
BUN: 20 mg/dL (ref 8–23)
CO2: 26 mmol/L (ref 22–32)
Calcium: 9.4 mg/dL (ref 8.9–10.3)
Chloride: 106 mmol/L (ref 98–111)
Creatinine: 0.61 mg/dL (ref 0.44–1.00)
GFR, Estimated: >60 mL/min (ref >60)
Glucose, Bld: 207 mg/dL — ABNORMAL HIGH (ref 70–99)
Potassium: 4.3 mmol/L (ref 3.5–5.1)
Sodium: 138 mmol/L (ref 135–145)
Total Bilirubin: 0.6 mg/dL (ref 0.3–1.2)
Total Protein: 6.8 g/dL (ref 6.5–8.1)

## 2021-05-03 LAB — SAMPLE TO BLOOD BANK

## 2021-05-03 LAB — FERRITIN: Ferritin: 1113 ng/mL — ABNORMAL HIGH (ref 11–307)

## 2021-05-03 MED ORDER — DARBEPOETIN ALFA 300 MCG/0.6ML IJ SOSY
300.0000 ug | PREFILLED_SYRINGE | Freq: Once | INTRAMUSCULAR | Status: AC
  Administered 2021-05-03: 300 ug via SUBCUTANEOUS
  Filled 2021-05-03: qty 0.6

## 2021-05-03 NOTE — Progress Notes (Signed)
Orders placed per MD for transfusion of 2 units PRBC's. ?Note pt states she is currently asymptomatic. ?

## 2021-05-04 ENCOUNTER — Other Ambulatory Visit: Payer: Self-pay | Admitting: *Deleted

## 2021-05-04 ENCOUNTER — Telehealth: Payer: Self-pay | Admitting: *Deleted

## 2021-05-04 DIAGNOSIS — D469 Myelodysplastic syndrome, unspecified: Secondary | ICD-10-CM

## 2021-05-04 DIAGNOSIS — D471 Chronic myeloproliferative disease: Secondary | ICD-10-CM

## 2021-05-04 NOTE — Telephone Encounter (Signed)
This RN spoke with the patient's husband who states issue of possible need to cancel blood infusion in the am. ? ?Tanya Mitchell's son is presently in the ICU at [facility] and family has been told to prepare for end of life - " they are increasing his ativan and decreasing his oxygen " ? ?Per above discussion- plan is if appt needs to be cancelled - they will call this RN - direct desk number given. ? ?This RN gave verbal support as well as concern for Tanya Mitchell's labs and need for transfusion- which we will accommodate as best as possible during this time. ?

## 2021-05-05 ENCOUNTER — Inpatient Hospital Stay: Payer: Medicare Other

## 2021-05-05 ENCOUNTER — Other Ambulatory Visit: Payer: Self-pay | Admitting: *Deleted

## 2021-05-05 DIAGNOSIS — D469 Myelodysplastic syndrome, unspecified: Secondary | ICD-10-CM

## 2021-05-05 DIAGNOSIS — D471 Chronic myeloproliferative disease: Secondary | ICD-10-CM

## 2021-05-05 LAB — PREPARE RBC (CROSSMATCH)

## 2021-05-06 ENCOUNTER — Other Ambulatory Visit: Payer: Self-pay

## 2021-05-06 ENCOUNTER — Other Ambulatory Visit: Payer: Self-pay | Admitting: *Deleted

## 2021-05-06 ENCOUNTER — Inpatient Hospital Stay: Payer: Medicare Other

## 2021-05-06 DIAGNOSIS — D471 Chronic myeloproliferative disease: Secondary | ICD-10-CM

## 2021-05-06 DIAGNOSIS — D46Z Other myelodysplastic syndromes: Secondary | ICD-10-CM | POA: Diagnosis not present

## 2021-05-06 DIAGNOSIS — D469 Myelodysplastic syndrome, unspecified: Secondary | ICD-10-CM

## 2021-05-06 LAB — BPAM RBC
Blood Product Expiration Date: 202304052359
Blood Product Expiration Date: 202304052359
Unit Type and Rh: 600
Unit Type and Rh: 600

## 2021-05-06 LAB — TYPE AND SCREEN
ABO/RH(D): AB NEG
Antibody Screen: NEGATIVE
Unit division: 0
Unit division: 0

## 2021-05-06 LAB — CBC WITH DIFFERENTIAL (CANCER CENTER ONLY)
Abs Immature Granulocytes: 6.83 10*3/uL — ABNORMAL HIGH (ref 0.00–0.07)
Basophils Absolute: 0.1 10*3/uL (ref 0.0–0.1)
Basophils Relative: 0 %
Eosinophils Absolute: 0 10*3/uL (ref 0.0–0.5)
Eosinophils Relative: 0 %
HCT: 18.4 % — ABNORMAL LOW (ref 36.0–46.0)
Hemoglobin: 6.3 g/dL — CL (ref 12.0–15.0)
Immature Granulocytes: 15 %
Lymphocytes Relative: 6 %
Lymphs Abs: 2.8 10*3/uL (ref 0.7–4.0)
MCH: 38.4 pg — ABNORMAL HIGH (ref 26.0–34.0)
MCHC: 34.2 g/dL (ref 30.0–36.0)
MCV: 112.2 fL — ABNORMAL HIGH (ref 80.0–100.0)
Monocytes Absolute: 0.9 10*3/uL (ref 0.1–1.0)
Monocytes Relative: 2 %
Neutro Abs: 34.3 10*3/uL — ABNORMAL HIGH (ref 1.7–7.7)
Neutrophils Relative %: 77 %
Platelet Count: 145 10*3/uL — ABNORMAL LOW (ref 150–400)
RBC: 1.64 MIL/uL — ABNORMAL LOW (ref 3.87–5.11)
RDW: 24.8 % — ABNORMAL HIGH (ref 11.5–15.5)
Smear Review: NORMAL
WBC Count: 45 10*3/uL — ABNORMAL HIGH (ref 4.0–10.5)
nRBC: 0 % (ref 0.0–0.2)

## 2021-05-06 LAB — PREPARE RBC (CROSSMATCH)

## 2021-05-07 ENCOUNTER — Other Ambulatory Visit: Payer: Self-pay

## 2021-05-07 ENCOUNTER — Inpatient Hospital Stay: Payer: Medicare Other

## 2021-05-07 DIAGNOSIS — D471 Chronic myeloproliferative disease: Secondary | ICD-10-CM

## 2021-05-07 DIAGNOSIS — D46Z Other myelodysplastic syndromes: Secondary | ICD-10-CM | POA: Diagnosis not present

## 2021-05-07 LAB — BPAM RBC
Blood Product Expiration Date: 202304082359
Blood Product Expiration Date: 202304102359
ISSUE DATE / TIME: 202303250848
ISSUE DATE / TIME: 202303250848
Unit Type and Rh: 600
Unit Type and Rh: 600

## 2021-05-07 LAB — TYPE AND SCREEN
ABO/RH(D): AB NEG
Antibody Screen: NEGATIVE
Unit division: 0
Unit division: 0

## 2021-05-07 MED ORDER — DIPHENHYDRAMINE HCL 25 MG PO CAPS
25.0000 mg | ORAL_CAPSULE | Freq: Once | ORAL | Status: AC
  Administered 2021-05-07: 25 mg via ORAL
  Filled 2021-05-07: qty 1

## 2021-05-07 MED ORDER — SODIUM CHLORIDE 0.9% IV SOLUTION
250.0000 mL | Freq: Once | INTRAVENOUS | Status: AC
  Administered 2021-05-07: 250 mL via INTRAVENOUS

## 2021-05-07 MED ORDER — ACETAMINOPHEN 325 MG PO TABS
650.0000 mg | ORAL_TABLET | Freq: Once | ORAL | Status: AC
  Administered 2021-05-07: 650 mg via ORAL
  Filled 2021-05-07: qty 2

## 2021-05-09 ENCOUNTER — Telehealth: Payer: Self-pay | Admitting: Hematology and Oncology

## 2021-05-09 ENCOUNTER — Other Ambulatory Visit: Payer: Self-pay | Admitting: *Deleted

## 2021-05-09 ENCOUNTER — Other Ambulatory Visit: Payer: Self-pay

## 2021-05-09 ENCOUNTER — Inpatient Hospital Stay: Payer: Medicare Other

## 2021-05-09 DIAGNOSIS — D471 Chronic myeloproliferative disease: Secondary | ICD-10-CM

## 2021-05-09 DIAGNOSIS — D46Z Other myelodysplastic syndromes: Secondary | ICD-10-CM | POA: Diagnosis not present

## 2021-05-09 MED ORDER — ACETAMINOPHEN 325 MG PO TABS
650.0000 mg | ORAL_TABLET | Freq: Once | ORAL | Status: AC
  Administered 2021-05-09: 650 mg via ORAL
  Filled 2021-05-09: qty 2

## 2021-05-09 MED ORDER — DIPHENHYDRAMINE HCL 25 MG PO CAPS
25.0000 mg | ORAL_CAPSULE | Freq: Once | ORAL | Status: AC
  Administered 2021-05-09: 25 mg via ORAL
  Filled 2021-05-09: qty 1

## 2021-05-09 MED ORDER — SODIUM CHLORIDE 0.9% IV SOLUTION
250.0000 mL | Freq: Once | INTRAVENOUS | Status: AC
  Administered 2021-05-09: 250 mL via INTRAVENOUS

## 2021-05-09 NOTE — Patient Instructions (Signed)
Blood Transfusion, Adult A blood transfusion is a procedure in which you receive blood or a type of blood cell (blood component) through an IV. You may need a blood transfusion when your blood level is low. This may result from a bleeding disorder, illness, injury, or surgery. The blood may come from a donor. You may also be able to donate blood for yourself (autologous blood donation) before a planned surgery. The blood given in a transfusion is made up of different blood components. You may receive: Red blood cells. These carry oxygen to the cells in the body. Platelets. These help your blood to clot. Plasma. This is the liquid part of your blood. It carries proteins and other substances throughout the body. White blood cells. These help you fight infections. If you have hemophilia or another clotting disorder, you may also receive other types of blood products. Tell a health care provider about: Any blood disorders you have. Any previous reactions you have had during a blood transfusion. Any allergies you have. All medicines you are taking, including vitamins, herbs, eye drops, creams, and over-the-counter medicines. Any surgeries you have had. Any medical conditions you have, including any recent fever or cold symptoms. Whether you are pregnant or may be pregnant. What are the risks? Generally, this is a safe procedure. However, problems may occur. The most common problems include: A mild allergic reaction, such as red, swollen areas of skin (hives) and itching. Fever or chills. This may be the body's response to new blood cells received. This may occur during or up to 4 hours after the transfusion. More serious problems may include: Transfusion-associated circulatory overload (TACO), or too much fluid in the lungs. This may cause breathing problems. A serious allergic reaction, such as difficulty breathing or swelling around the face and lips. Transfusion-related acute lung injury  (TRALI), which causes breathing difficulty and low oxygen in the blood. This can occur within hours of the transfusion or several days later. Iron overload. This can happen after receiving many blood transfusions over a period of time. Infection or virus being transmitted. This is rare because donated blood is carefully tested before it is given. Hemolytic transfusion reaction. This is rare. It happens when your body's defense system (immune system)tries to attack the new blood cells. Symptoms may include fever, chills, nausea, low blood pressure, and low back or chest pain. Transfusion-associated graft-versus-host disease (TAGVHD). This is rare. It happens when donated cells attack your body's healthy tissues. What happens before the procedure? Medicines Ask your health care provider about: Changing or stopping your regular medicines. This is especially important if you are taking diabetes medicines or blood thinners. Taking medicines such as aspirin and ibuprofen. These medicines can thin your blood. Do not take these medicines unless your health care provider tells you to take them. Taking over-the-counter medicines, vitamins, herbs, and supplements. General instructions Follow instructions from your health care provider about eating and drinking restrictions. You will have a blood test to determine your blood type. This is necessary to know what kind of blood your body will accept and to match it to the donor blood. If you are going to have a planned surgery, you may be able to do an autologous blood donation. This may be done in case you need to have a transfusion. You will have your temperature, blood pressure, and pulse monitored before the transfusion. If you have had an allergic reaction to a transfusion in the past, you may be given medicine to help prevent   a reaction. This medicine may be given to you by mouth (orally) or through an IV. Set aside time for the blood transfusion. This  procedure generally takes 1-4 hours to complete. What happens during the procedure?  An IV will be inserted into one of your veins. The bag of donated blood will be attached to your IV. The blood will then enter through your vein. Your temperature, blood pressure, and pulse will be monitored regularly during the transfusion. This monitoring is done to detect early signs of a transfusion reaction. Tell your nurse right away if you have any of these symptoms during the transfusion: Shortness of breath or trouble breathing. Chest or back pain. Fever or chills. Hives or itching. If you have any signs or symptoms of a reaction, your transfusion will be stopped and you may be given medicine. When the transfusion is complete, your IV will be removed. Pressure may be applied to the IV site for a few minutes. A bandage (dressing)will be applied. The procedure may vary among health care providers and hospitals. What happens after the procedure? Your temperature, blood pressure, pulse, breathing rate, and blood oxygen level will be monitored until you leave the hospital or clinic. Your blood may be tested to see how you are responding to the transfusion. You may be warmed with fluids or blankets to maintain a normal body temperature. If you receive your blood transfusion in an outpatient setting, you will be told whom to contact to report any reactions. Where to find more information For more information on blood transfusions, visit the American Red Cross: redcross.org Summary A blood transfusion is a procedure in which you receive blood or a type of blood cell (blood component) through an IV. The blood you receive may come from a donor or be donated by yourself (autologous blood donation) before a planned surgery. The blood given in a transfusion is made up of different blood components. You may receive red blood cells, platelets, plasma, or white blood cells depending on the condition treated. Your  temperature, blood pressure, and pulse will be monitored before, during, and after the transfusion. After the transfusion, your blood may be tested to see how your body has responded. This information is not intended to replace advice given to you by your health care provider. Make sure you discuss any questions you have with your health care provider. Document Revised: 12/05/2018 Document Reviewed: 07/25/2018 Elsevier Patient Education  2022 Elsevier Inc.  

## 2021-05-09 NOTE — Telephone Encounter (Signed)
Scheduled appointment per inbasket message. Patient aware.   ?

## 2021-05-10 LAB — TYPE AND SCREEN
ABO/RH(D): AB NEG
Antibody Screen: NEGATIVE
Unit division: 0
Unit division: 0

## 2021-05-10 LAB — BPAM RBC
Blood Product Expiration Date: 202304082359
Blood Product Expiration Date: 202304132359
ISSUE DATE / TIME: 202303251023
ISSUE DATE / TIME: 202303271508
Unit Type and Rh: 600
Unit Type and Rh: 600

## 2021-05-12 DIAGNOSIS — D2262 Melanocytic nevi of left upper limb, including shoulder: Secondary | ICD-10-CM | POA: Diagnosis not present

## 2021-05-12 DIAGNOSIS — D2261 Melanocytic nevi of right upper limb, including shoulder: Secondary | ICD-10-CM | POA: Diagnosis not present

## 2021-05-12 DIAGNOSIS — D225 Melanocytic nevi of trunk: Secondary | ICD-10-CM | POA: Diagnosis not present

## 2021-05-12 DIAGNOSIS — Z85820 Personal history of malignant melanoma of skin: Secondary | ICD-10-CM | POA: Diagnosis not present

## 2021-05-13 ENCOUNTER — Other Ambulatory Visit: Payer: Medicare Other

## 2021-05-13 ENCOUNTER — Ambulatory Visit: Payer: Medicare Other | Admitting: Hematology and Oncology

## 2021-05-16 DIAGNOSIS — D471 Chronic myeloproliferative disease: Secondary | ICD-10-CM | POA: Diagnosis not present

## 2021-05-17 ENCOUNTER — Inpatient Hospital Stay: Payer: Medicare Other

## 2021-05-17 ENCOUNTER — Other Ambulatory Visit: Payer: Self-pay

## 2021-05-17 ENCOUNTER — Inpatient Hospital Stay: Payer: Medicare Other | Attending: Oncology | Admitting: Hematology and Oncology

## 2021-05-17 ENCOUNTER — Encounter: Payer: Self-pay | Admitting: Hematology and Oncology

## 2021-05-17 VITALS — BP 115/46 | HR 85 | Temp 97.9°F | Resp 16 | Ht 62.0 in | Wt 116.7 lb

## 2021-05-17 DIAGNOSIS — D469 Myelodysplastic syndrome, unspecified: Secondary | ICD-10-CM | POA: Diagnosis not present

## 2021-05-17 DIAGNOSIS — D46Z Other myelodysplastic syndromes: Secondary | ICD-10-CM | POA: Insufficient documentation

## 2021-05-17 DIAGNOSIS — D471 Chronic myeloproliferative disease: Secondary | ICD-10-CM | POA: Diagnosis not present

## 2021-05-17 DIAGNOSIS — D619 Aplastic anemia, unspecified: Secondary | ICD-10-CM

## 2021-05-17 MED ORDER — DARBEPOETIN ALFA 300 MCG/0.6ML IJ SOSY
300.0000 ug | PREFILLED_SYRINGE | Freq: Once | INTRAMUSCULAR | Status: AC
  Administered 2021-05-17: 300 ug via SUBCUTANEOUS
  Filled 2021-05-17: qty 0.6

## 2021-05-17 NOTE — Progress Notes (Signed)
ID: Tanya Mitchell OB: [birth year]  MR#: [id number]  MCN#:[id number] ? ?Patient Care Team: ?Benay Pike, MD as PCP - General (Hematology and Oncology) ?Burtis Junes, NP (Inactive) as Nurse Practitioner (Nurse Practitioner) ?Bobbye Charleston, MD as Consulting Physician (Obstetrics and Gynecology) ?Janyth Contes, MD as Referring Physician (Hematology and Oncology) ?OTHER MD: ? ? ?CHIEF COMPLAINTS:   1)  Hx Left Breast Cancer 1995 (s/p left mastectomy) ?      2) Hx Right Breast Cancer 2007 ?                  3)  Hx Melanoma, Stage IIIA ?      4) MPN with leukocytosis/thrombocytosis     ? ?CURRENT TREATMENT: hydrea; aranesp; allopurinol ? ? ?INTERVAL HISTORY: ?Tanya Mitchell returns today for follow-up of her MPN. ?She is currently on hydrea 500 mg PO every other day ?She needed 2 units of PRBC in the past 4 weeks ?She is on Darbopoetin Q 14 days. ?She says her EF has improved, currently continues on lasix ?She is going through a very rough time since her son passed away unexpectedly about 2 weeks ago.  She is going through a lot of grief.  She was very tearful today. ? ?Rest of the pertinent 10 point ROS reviewed and negative. ?REVIEW OF SYSTEMS: ?A detailed review of systems was otherwise stable ? ? COVID 19 VACCINATION STATUS: [place] x2, most recently 04/2019; had COVID May 2022 ? ? ?HISTORY OF PRESENT ILLNESS: ?From the earlier summary: ? ?Tanya Mitchell's history of breast cancer dates back to June of 1995, when she had left modified radical mastectomy under [facility] for a pT1c pN1, stage IIA invasive ductal carcinoma, grade 2, estrogen receptor 97% positive, progesterone receptor 45% positive, treated according to CALGB 9394, sequential arm (doxorubicin x3, fourth dose apparently omitted, followed by high-dose cyclophosphamide x3), followed by tamoxifen for 5 years. There has been no evidence of disease recurrence. ? ?Further, in October of 2007 she underwent right lumpectomy for a ductal carcinoma in situ,  high-grade, estrogen and progesterone receptor negative, followed by adjuvant radiation. ? ?In July of 2013 the patient was noted to have an irregular mole in her left upper back. It is not clear to me whether this might be related to her prior left breast irradiation. Shave biopsy of this area 09/15/2011 by Dr. Derrel Nip [phone]) showed a superficial spreading malignant melanoma, with Breslow depth 1.45 mm, Clark's level IV. There was brisk host response and 8 mitoses per high power field were noted. There was focal regression but no definitive ulceration. There was no satellitosis. There was no vascular and urgent. Margins were positive, but cleared by wide excision under Dr Stark Klein 10/10/2011. There was some residual malignant melanoma in situ but margins to 2 cm were obtained, with advancement flap closure of the skin defect. In addition left axillary sentinel lymph node mapping was performed showing metastatic melanoma in the single lymph node removed. ? ?Tanya Mitchell sought a second opinion at [facility] under Dr Joaquim Lai Collichio. There was a full discussion regarding completion nodal resection, use of adjuvant interferon, and BRAF testing with a view to participation in a research protocol then available. The patient considered all these options and after much discussion the decision was made not to pursue full nodal dissection, partly because the area in question had a ready undergone surgery and radiation. The patient opted against interferon adjuvant therapy because of its very marginal benefit and significant side effects. She declined consideration of a  research study and BRAF testing was not performed. ? ?Her subsequent history is as detailed below. ? ? ?PAST MEDICAL HISTORY: ?Past Medical History:  ?Diagnosis Date  ? Breast cancer ([place]) 1995 and 2007  ? Cardiomyopathy secondary to chemotherapy [facility])   ? has improved   ? Hyperlipidemia   ? Hypertension   ? under control, has been on med. x 3 yrs.  ?  Melanoma in situ of back ([place]) 09/2011  ? left  ? Personal history of chemotherapy   ? Personal history of radiation therapy   ? Skin cancer 2013  ? Melanoma  ? ? ?PAST SURGICAL HISTORY: ?Past Surgical History:  ?Procedure Laterality Date  ? BREAST LUMPECTOMY Right 12/21/2005  ? right  ? MASTECTOMY Left 1995  ? with breast reconstruction - left  ? RIGHT/LEFT HEART CATH AND CORONARY ANGIOGRAPHY N/A 04/20/2021  ? Procedure: RIGHT/LEFT HEART CATH AND CORONARY ANGIOGRAPHY;  Surgeon: Jettie Booze, MD;  Location: [facility];  Service: Cardiovascular;  Laterality: N/A;  ? ? ?FAMILY HISTORY ?Family History  ?Problem Relation Age of Onset  ? Lung cancer Father   ? Cancer Father   ?     lung  ? Autism Son   ? Breast cancer Daughter   ? the patient's father died from complications of lung cancer at the age of 68. He was a heavy smoker. The patient's mother died at the age of 83. Tanya Mitchell had no siblings. She underwent genetic testing for breast and ovarian cancer panel April of 2014. There were no demonstrable mutations in the BRCA or the other genes in the panel ? ? ?GYNECOLOGIC HISTORY:  ?Menarche age 2, first live birth age 75. She is GX P2. She entered menopause at age 51, when she received her chemotherapy. She did not use hormone replacement. She did use birth control remotely, for approximately 14 years, with no complications. ? ? ?SOCIAL HISTORY:   (Updated 06/03/2013) ?Tanya Mitchell used to work at [employer], and she is still "fills in" there part-time.  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  They recently me moved to a new home in the [facility] area.  Tanya Mitchell's son Tanya Mitchell is handicapped, and works as an Training and development officer. 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 Azaya has 3 grandchildren and [place] 2. Westyn grew up in an Database administrator denomination ? ? ADVANCED  DIRECTIVES: In place ? ? ?HEALTH MAINTENANCE:  ?Social History  ? ?Tobacco Use  ? Smoking status: Former  ?  Types: Cigarettes  ?  Quit date: 02/14/1964  ?  Years since quitting: 57.2  ? Smokeless tobacco: Never  ?Vaping Use  ? Vaping Use: Never used  ?Substance Use Topics  ? Alcohol use: Yes  ?  Comment: daily glass of wine  ? Drug use: No  ? ? ? Colonoscopy: 2011 ? PAP: November 2014 ? Bone density: January 2013, osteopenia ? Lipid panel:   August 2013/Dr. Barnes ?  ?No Known Allergies ? ?Current Outpatient Medications  ?Medication Sig Dispense Refill  ? albuterol (VENTOLIN HFA) 108 (90 Base) MCG/ACT inhaler Inhale 1-2 puffs into the lungs every 6 (six) hours as needed for shortness of breath or wheezing.    ? allopurinol (ZYLOPRIM) 300 MG tablet Take 1 tablet (300 mg total) by mouth daily. 60 tablet 6  ? aspirin EC 81 MG tablet Take 1 tablet (81 mg total) by  mouth daily. Swallow whole. 90 tablet 3  ? atorvastatin (LIPITOR) 80 MG tablet Take 1 tablet (80 mg total) by mouth daily. 90 tablet 2  ? carvedilol (COREG) 3.125 MG tablet Take 1 tablet (3.125 mg total) by mouth 2 (two) times daily. 180 tablet 1  ? cholecalciferol (VITAMIN D) 1000 UNITS tablet Take 2,000 Units by mouth in the morning.    ? furosemide (LASIX) 40 MG tablet Take 1 tablet (40 mg total) by mouth daily as needed. 90 tablet 1  ? hydroxyurea (HYDREA) 500 MG capsule Take 500 mg by mouth every other day. In the evening. May take with food to minimize GI side effects.    ? Magnesium 400 MG TABS Take 1 tablet by mouth daily as needed. Only take on days taking furosemide 90 tablet 1  ? metFORMIN (GLUCOPHAGE) 500 MG tablet Take 0.5 tablets (250 mg total) by mouth in the morning and at bedtime.    ? potassium chloride SA (KLOR-CON M) 20 MEQ tablet Take 1 tablet (20 mEq total) by mouth daily as needed. Only on days taking furosemide 90 tablet 1  ? sacubitril-valsartan (ENTRESTO) 24-26 MG Take 1 tablet by mouth 2 (two) times daily. 60 tablet 3  ?  spironolactone (ALDACTONE) 25 MG tablet Take 1 tablet (25 mg total) by mouth daily. 90 tablet 0  ? triamcinolone (KENALOG) 0.025 % ointment Apply topically as needed.    ? ?No current facility-administered medications for this visit.

## 2021-05-31 ENCOUNTER — Other Ambulatory Visit: Payer: Self-pay | Admitting: *Deleted

## 2021-05-31 ENCOUNTER — Inpatient Hospital Stay: Payer: Medicare Other

## 2021-05-31 ENCOUNTER — Other Ambulatory Visit: Payer: Self-pay

## 2021-05-31 DIAGNOSIS — D649 Anemia, unspecified: Secondary | ICD-10-CM

## 2021-05-31 DIAGNOSIS — D469 Myelodysplastic syndrome, unspecified: Secondary | ICD-10-CM

## 2021-05-31 DIAGNOSIS — D471 Chronic myeloproliferative disease: Secondary | ICD-10-CM

## 2021-05-31 DIAGNOSIS — D46Z Other myelodysplastic syndromes: Secondary | ICD-10-CM | POA: Diagnosis not present

## 2021-05-31 LAB — CBC WITH DIFFERENTIAL (CANCER CENTER ONLY)
Abs Immature Granulocytes: 26.34 10*3/uL — ABNORMAL HIGH (ref 0.00–0.07)
Basophils Absolute: 0.1 10*3/uL (ref 0.0–0.1)
Basophils Relative: 0 %
Eosinophils Absolute: 0 10*3/uL (ref 0.0–0.5)
Eosinophils Relative: 0 %
HCT: 20.9 % — ABNORMAL LOW (ref 36.0–46.0)
Hemoglobin: 7.3 g/dL — ABNORMAL LOW (ref 12.0–15.0)
Immature Granulocytes: 20 %
Lymphocytes Relative: 4 %
Lymphs Abs: 4.9 10*3/uL — ABNORMAL HIGH (ref 0.7–4.0)
MCH: 36.7 pg — ABNORMAL HIGH (ref 26.0–34.0)
MCHC: 34.9 g/dL (ref 30.0–36.0)
MCV: 105 fL — ABNORMAL HIGH (ref 80.0–100.0)
Monocytes Absolute: 2.8 10*3/uL — ABNORMAL HIGH (ref 0.1–1.0)
Monocytes Relative: 2 %
Neutro Abs: 95.6 10*3/uL — ABNORMAL HIGH (ref 1.7–7.7)
Neutrophils Relative %: 74 %
Platelet Count: 144 10*3/uL — ABNORMAL LOW (ref 150–400)
RBC: 1.99 MIL/uL — ABNORMAL LOW (ref 3.87–5.11)
RDW: 24.8 % — ABNORMAL HIGH (ref 11.5–15.5)
Smear Review: NORMAL
WBC Count: 129.8 10*3/uL (ref 4.0–10.5)
nRBC: 0 % (ref 0.0–0.2)

## 2021-05-31 LAB — PREPARE RBC (CROSSMATCH)

## 2021-05-31 MED ORDER — DIPHENHYDRAMINE HCL 25 MG PO CAPS
25.0000 mg | ORAL_CAPSULE | Freq: Once | ORAL | Status: AC
  Administered 2021-05-31: 25 mg via ORAL
  Filled 2021-05-31: qty 1

## 2021-05-31 MED ORDER — SODIUM CHLORIDE 0.9% IV SOLUTION
250.0000 mL | Freq: Once | INTRAVENOUS | Status: AC
  Administered 2021-05-31: 250 mL via INTRAVENOUS

## 2021-05-31 MED ORDER — ACETAMINOPHEN 325 MG PO TABS
650.0000 mg | ORAL_TABLET | Freq: Once | ORAL | Status: AC
  Administered 2021-05-31: 650 mg via ORAL
  Filled 2021-05-31: qty 2

## 2021-05-31 NOTE — Progress Notes (Signed)
CRITICAL VALUE STICKER ? ?CRITICAL VALUE: WBC 129.8 ? ?RECEIVER (on-site recipient of call): Edrik Rundle ? ?DATE & TIME NOTIFIED: 4/18 9323 ? ?MESSENGER (representative from lab): Pam ? ?MD NOTIFIED: Yes ? ?TIME OF NOTIFICATION: 0822 ? ? ?

## 2021-05-31 NOTE — Progress Notes (Signed)
Verbal order from Dr. Chryl Heck for 1 unit PRBCs. ?Confirmed [facility] has order for t&s w/eth in [facility]. ?

## 2021-05-31 NOTE — Patient Instructions (Signed)
Blood Transfusion, Adult A blood transfusion is a procedure in which you receive blood or a type of blood cell (blood component) through an IV. You may need a blood transfusion when your blood level is low. This may result from a bleeding disorder, illness, injury, or surgery. The blood may come from a donor. You may also be able to donate blood for yourself (autologous blood donation) before a planned surgery. The blood given in a transfusion is made up of different blood components. You may receive: Red blood cells. These carry oxygen to the cells in the body. Platelets. These help your blood to clot. Plasma. This is the liquid part of your blood. It carries proteins and other substances throughout the body. White blood cells. These help you fight infections. If you have hemophilia or another clotting disorder, you may also receive other types of blood products. Tell a health care provider about: Any blood disorders you have. Any previous reactions you have had during a blood transfusion. Any allergies you have. All medicines you are taking, including vitamins, herbs, eye drops, creams, and over-the-counter medicines. Any surgeries you have had. Any medical conditions you have, including any recent fever or cold symptoms. Whether you are pregnant or may be pregnant. What are the risks? Generally, this is a safe procedure. However, problems may occur. The most common problems include: A mild allergic reaction, such as red, swollen areas of skin (hives) and itching. Fever or chills. This may be the body's response to new blood cells received. This may occur during or up to 4 hours after the transfusion. More serious problems may include: Transfusion-associated circulatory overload (TACO), or too much fluid in the lungs. This may cause breathing problems. A serious allergic reaction, such as difficulty breathing or swelling around the face and lips. Transfusion-related acute lung injury  (TRALI), which causes breathing difficulty and low oxygen in the blood. This can occur within hours of the transfusion or several days later. Iron overload. This can happen after receiving many blood transfusions over a period of time. Infection or virus being transmitted. This is rare because donated blood is carefully tested before it is given. Hemolytic transfusion reaction. This is rare. It happens when your body's defense system (immune system)tries to attack the new blood cells. Symptoms may include fever, chills, nausea, low blood pressure, and low back or chest pain. Transfusion-associated graft-versus-host disease (TAGVHD). This is rare. It happens when donated cells attack your body's healthy tissues. What happens before the procedure? Medicines Ask your health care provider about: Changing or stopping your regular medicines. This is especially important if you are taking diabetes medicines or blood thinners. Taking medicines such as aspirin and ibuprofen. These medicines can thin your blood. Do not take these medicines unless your health care provider tells you to take them. Taking over-the-counter medicines, vitamins, herbs, and supplements. General instructions Follow instructions from your health care provider about eating and drinking restrictions. You will have a blood test to determine your blood type. This is necessary to know what kind of blood your body will accept and to match it to the donor blood. If you are going to have a planned surgery, you may be able to do an autologous blood donation. This may be done in case you need to have a transfusion. You will have your temperature, blood pressure, and pulse monitored before the transfusion. If you have had an allergic reaction to a transfusion in the past, you may be given medicine to help prevent   a reaction. This medicine may be given to you by mouth (orally) or through an IV. Set aside time for the blood transfusion. This  procedure generally takes 1-4 hours to complete. What happens during the procedure?  An IV will be inserted into one of your veins. The bag of donated blood will be attached to your IV. The blood will then enter through your vein. Your temperature, blood pressure, and pulse will be monitored regularly during the transfusion. This monitoring is done to detect early signs of a transfusion reaction. Tell your nurse right away if you have any of these symptoms during the transfusion: Shortness of breath or trouble breathing. Chest or back pain. Fever or chills. Hives or itching. If you have any signs or symptoms of a reaction, your transfusion will be stopped and you may be given medicine. When the transfusion is complete, your IV will be removed. Pressure may be applied to the IV site for a few minutes. A bandage (dressing)will be applied. The procedure may vary among health care providers and hospitals. What happens after the procedure? Your temperature, blood pressure, pulse, breathing rate, and blood oxygen level will be monitored until you leave the hospital or clinic. Your blood may be tested to see how you are responding to the transfusion. You may be warmed with fluids or blankets to maintain a normal body temperature. If you receive your blood transfusion in an outpatient setting, you will be told whom to contact to report any reactions. Where to find more information For more information on blood transfusions, visit the American Red Cross: redcross.org Summary A blood transfusion is a procedure in which you receive blood or a type of blood cell (blood component) through an IV. The blood you receive may come from a donor or be donated by yourself (autologous blood donation) before a planned surgery. The blood given in a transfusion is made up of different blood components. You may receive red blood cells, platelets, plasma, or white blood cells depending on the condition treated. Your  temperature, blood pressure, and pulse will be monitored before, during, and after the transfusion. After the transfusion, your blood may be tested to see how your body has responded. This information is not intended to replace advice given to you by your health care provider. Make sure you discuss any questions you have with your health care provider. Document Revised: 12/05/2018 Document Reviewed: 07/25/2018 Elsevier Patient Education  2022 Elsevier Inc.  

## 2021-06-01 ENCOUNTER — Other Ambulatory Visit: Payer: Self-pay

## 2021-06-01 ENCOUNTER — Encounter: Payer: Self-pay | Admitting: Hematology and Oncology

## 2021-06-01 DIAGNOSIS — D469 Myelodysplastic syndrome, unspecified: Secondary | ICD-10-CM

## 2021-06-01 LAB — TYPE AND SCREEN
ABO/RH(D): AB NEG
Antibody Screen: NEGATIVE
Unit division: 0

## 2021-06-01 LAB — BPAM RBC
Blood Product Expiration Date: 202304302359
ISSUE DATE / TIME: 202304180953
Unit Type and Rh: 600

## 2021-06-01 NOTE — Progress Notes (Signed)
Orders for PRBC entered per MD. Pt has lab & transfusion appt 06/06/21 pending hgb results.  ?

## 2021-06-01 NOTE — Addendum Note (Signed)
Addended by: Adaline Sill on: 06/01/2021 03:54 PM ? ? Modules accepted: Orders ? ?

## 2021-06-03 ENCOUNTER — Inpatient Hospital Stay: Payer: Medicare Other

## 2021-06-03 ENCOUNTER — Other Ambulatory Visit: Payer: Self-pay

## 2021-06-03 ENCOUNTER — Telehealth: Payer: Self-pay

## 2021-06-03 DIAGNOSIS — D46Z Other myelodysplastic syndromes: Secondary | ICD-10-CM | POA: Diagnosis not present

## 2021-06-03 DIAGNOSIS — D469 Myelodysplastic syndrome, unspecified: Secondary | ICD-10-CM

## 2021-06-03 LAB — CBC WITH DIFFERENTIAL (CANCER CENTER ONLY)
Abs Immature Granulocytes: 22.35 10*3/uL — ABNORMAL HIGH (ref 0.00–0.07)
Basophils Absolute: 0.1 10*3/uL (ref 0.0–0.1)
Basophils Relative: 0 %
Eosinophils Absolute: 0 10*3/uL (ref 0.0–0.5)
Eosinophils Relative: 0 %
HCT: 26.8 % — ABNORMAL LOW (ref 36.0–46.0)
Hemoglobin: 9.1 g/dL — ABNORMAL LOW (ref 12.0–15.0)
Immature Granulocytes: 18 %
Lymphocytes Relative: 4 %
Lymphs Abs: 4.8 10*3/uL — ABNORMAL HIGH (ref 0.7–4.0)
MCH: 33.7 pg (ref 26.0–34.0)
MCHC: 34 g/dL (ref 30.0–36.0)
MCV: 99.3 fL (ref 80.0–100.0)
Monocytes Absolute: 3 10*3/uL — ABNORMAL HIGH (ref 0.1–1.0)
Monocytes Relative: 2 %
Neutro Abs: 92.5 10*3/uL — ABNORMAL HIGH (ref 1.7–7.7)
Neutrophils Relative %: 76 %
Platelet Count: 124 10*3/uL — ABNORMAL LOW (ref 150–400)
RBC: 2.7 MIL/uL — ABNORMAL LOW (ref 3.87–5.11)
RDW: 25.6 % — ABNORMAL HIGH (ref 11.5–15.5)
Smear Review: NORMAL
WBC Count: 122.8 10*3/uL (ref 4.0–10.5)
nRBC: 0 % (ref 0.0–0.2)

## 2021-06-03 LAB — CMP (CANCER CENTER ONLY)
ALT: 24 U/L (ref 0–44)
AST: 25 U/L (ref 15–41)
Albumin: 3.5 g/dL (ref 3.5–5.0)
Alkaline Phosphatase: 193 U/L — ABNORMAL HIGH (ref 38–126)
Anion gap: 8 (ref 5–15)
BUN: 15 mg/dL (ref 8–23)
CO2: 28 mmol/L (ref 22–32)
Calcium: 9.5 mg/dL (ref 8.9–10.3)
Chloride: 101 mmol/L (ref 98–111)
Creatinine: 0.65 mg/dL (ref 0.44–1.00)
GFR, Estimated: >60 mL/min (ref >60)
Glucose, Bld: 153 mg/dL — ABNORMAL HIGH (ref 70–99)
Potassium: 4.2 mmol/L (ref 3.5–5.1)
Sodium: 137 mmol/L (ref 135–145)
Total Bilirubin: 0.7 mg/dL (ref 0.3–1.2)
Total Protein: 7.1 g/dL (ref 6.5–8.1)

## 2021-06-03 LAB — SAMPLE TO BLOOD BANK

## 2021-06-03 NOTE — Telephone Encounter (Signed)
CRITICAL VALUE STICKER ? ?CRITICAL VALUE:WBC 123 ? ?RECEIVER (on-site recipient of call): Angelik Walls Janalyn Harder, RN ? ?DATE & TIME NOTIFIED: 06/03/21 at 1220 ? ?MESSENGER (representative from lab): Orvis Brill ? ?MD NOTIFIED: Dr.Iruku ? ?TIME OF NOTIFICATION: 1225 ? ?RESPONSE:  ? MD made aware ?

## 2021-06-03 NOTE — Telephone Encounter (Signed)
Pt had labs today and a hgb of 9.1 so she will not need blood transfusion per schedule. Pt will come back 4/24 for a cbc and sample to BB to ensure she will/will not need blood before leaving for her trip to [place]. Pt is aware of appts and verbalizes thanks.  ?

## 2021-06-06 ENCOUNTER — Inpatient Hospital Stay: Payer: Medicare Other

## 2021-06-06 DIAGNOSIS — D469 Myelodysplastic syndrome, unspecified: Secondary | ICD-10-CM

## 2021-06-06 DIAGNOSIS — D46Z Other myelodysplastic syndromes: Secondary | ICD-10-CM | POA: Diagnosis not present

## 2021-06-06 LAB — CBC WITH DIFFERENTIAL (CANCER CENTER ONLY)
Abs Immature Granulocytes: 27.01 10*3/uL — ABNORMAL HIGH (ref 0.00–0.07)
Basophils Absolute: 0.1 10*3/uL (ref 0.0–0.1)
Basophils Relative: 0 %
Eosinophils Absolute: 0 10*3/uL (ref 0.0–0.5)
Eosinophils Relative: 0 %
HCT: 23.8 % — ABNORMAL LOW (ref 36.0–46.0)
Hemoglobin: 8.3 g/dL — ABNORMAL LOW (ref 12.0–15.0)
Immature Granulocytes: 22 %
Lymphocytes Relative: 4 %
Lymphs Abs: 4.6 10*3/uL — ABNORMAL HIGH (ref 0.7–4.0)
MCH: 34.9 pg — ABNORMAL HIGH (ref 26.0–34.0)
MCHC: 34.9 g/dL (ref 30.0–36.0)
MCV: 100 fL (ref 80.0–100.0)
Monocytes Absolute: 2.7 10*3/uL — ABNORMAL HIGH (ref 0.1–1.0)
Monocytes Relative: 2 %
Neutro Abs: 89.6 10*3/uL — ABNORMAL HIGH (ref 1.7–7.7)
Neutrophils Relative %: 72 %
Platelet Count: 115 10*3/uL — ABNORMAL LOW (ref 150–400)
RBC: 2.38 MIL/uL — ABNORMAL LOW (ref 3.87–5.11)
RDW: 24.5 % — ABNORMAL HIGH (ref 11.5–15.5)
Smear Review: NORMAL
WBC Count: 124.1 10*3/uL (ref 4.0–10.5)
nRBC: 0 % (ref 0.0–0.2)

## 2021-06-06 LAB — PREPARE RBC (CROSSMATCH)

## 2021-06-06 MED ORDER — DIPHENHYDRAMINE HCL 25 MG PO CAPS
25.0000 mg | ORAL_CAPSULE | Freq: Once | ORAL | Status: AC
  Administered 2021-06-06: 25 mg via ORAL
  Filled 2021-06-06: qty 1

## 2021-06-06 MED ORDER — ACETAMINOPHEN 325 MG PO TABS
650.0000 mg | ORAL_TABLET | Freq: Once | ORAL | Status: AC
  Administered 2021-06-06: 650 mg via ORAL
  Filled 2021-06-06: qty 2

## 2021-06-06 NOTE — Patient Instructions (Signed)
Blood Transfusion, Adult, Care After This sheet gives you information about how to care for yourself after your procedure. Your doctor may also give you more specific instructions. If you have problems or questions, contact your doctor. What can I expect after the procedure? After the procedure, it is common to have: Bruising and soreness at the IV site. A headache. Follow these instructions at home: Insertion site care     Follow instructions from your doctor about how to take care of your insertion site. This is where an IV tube was put into your vein. Make sure you: Wash your hands with soap and water before and after you change your bandage (dressing). If you cannot use soap and water, use hand sanitizer. Change your bandage as told by your doctor. Check your insertion site every day for signs of infection. Check for: Redness, swelling, or pain. Bleeding from the site. Warmth. Pus or a bad smell. General instructions Take over-the-counter and prescription medicines only as told by your doctor. Rest as told by your doctor. Go back to your normal activities as told by your doctor. Keep all follow-up visits as told by your doctor. This is important. Contact a doctor if: You have itching or red, swollen areas of skin (hives). You feel worried or nervous (anxious). You feel weak after doing your normal activities. You have redness, swelling, warmth, or pain around the insertion site. You have blood coming from the insertion site, and the blood does not stop with pressure. You have pus or a bad smell coming from the insertion site. Get help right away if: You have signs of a serious reaction. This may be coming from an allergy or the body's defense system (immune system). Signs include: Trouble breathing or shortness of breath. Swelling of the face or feeling warm (flushed). Fever or chills. Head, chest, or back pain. Dark pee (urine) or blood in the pee. Widespread rash. Fast  heartbeat. Feeling dizzy or light-headed. You may receive your blood transfusion in an outpatient setting. If so, you will be told whom to contact to report any reactions. These symptoms may be an emergency. Do not wait to see if the symptoms will go away. Get medical help right away. Call your local emergency services (911 in the [place] Do not drive yourself to the hospital. Summary Bruising and soreness at the IV site are common. Check your insertion site every day for signs of infection. Rest as told by your doctor. Go back to your normal activities as told by your doctor. Get help right away if you have signs of a serious reaction. This information is not intended to replace advice given to you by your health care provider. Make sure you discuss any questions you have with your health care provider. Document Revised: 05/27/2020 Document Reviewed: 07/25/2018 Elsevier Patient Education  2023 Elsevier Inc.  

## 2021-06-07 ENCOUNTER — Other Ambulatory Visit: Payer: Self-pay | Admitting: *Deleted

## 2021-06-07 LAB — TYPE AND SCREEN
ABO/RH(D): AB NEG
Antibody Screen: NEGATIVE
Unit division: 0

## 2021-06-07 LAB — BPAM RBC
Blood Product Expiration Date: 202305042359
ISSUE DATE / TIME: 202304241033
Unit Type and Rh: 600

## 2021-06-10 ENCOUNTER — Other Ambulatory Visit: Payer: Self-pay | Admitting: *Deleted

## 2021-06-13 ENCOUNTER — Inpatient Hospital Stay: Payer: Medicare Other

## 2021-06-13 ENCOUNTER — Encounter: Payer: Self-pay | Admitting: Hematology and Oncology

## 2021-06-13 ENCOUNTER — Other Ambulatory Visit: Payer: Self-pay

## 2021-06-13 ENCOUNTER — Inpatient Hospital Stay: Payer: Medicare Other | Attending: Oncology | Admitting: Hematology and Oncology

## 2021-06-13 VITALS — BP 112/48 | HR 95 | Temp 97.5°F | Resp 16 | Ht 62.0 in | Wt 108.8 lb

## 2021-06-13 DIAGNOSIS — D471 Chronic myeloproliferative disease: Secondary | ICD-10-CM

## 2021-06-13 DIAGNOSIS — D469 Myelodysplastic syndrome, unspecified: Secondary | ICD-10-CM

## 2021-06-13 DIAGNOSIS — D46Z Other myelodysplastic syndromes: Secondary | ICD-10-CM | POA: Diagnosis not present

## 2021-06-13 LAB — CBC WITH DIFFERENTIAL (CANCER CENTER ONLY)
Abs Immature Granulocytes: 28.56 10*3/uL — ABNORMAL HIGH (ref 0.00–0.07)
Basophils Absolute: 0.1 10*3/uL (ref 0.0–0.1)
Basophils Relative: 0 %
Eosinophils Absolute: 0 10*3/uL (ref 0.0–0.5)
Eosinophils Relative: 0 %
HCT: 27.4 % — ABNORMAL LOW (ref 36.0–46.0)
Hemoglobin: 9.2 g/dL — ABNORMAL LOW (ref 12.0–15.0)
Immature Granulocytes: 23 %
Lymphocytes Relative: 4 %
Lymphs Abs: 4.7 10*3/uL — ABNORMAL HIGH (ref 0.7–4.0)
MCH: 32.3 pg (ref 26.0–34.0)
MCHC: 33.6 g/dL (ref 30.0–36.0)
MCV: 96.1 fL (ref 80.0–100.0)
Monocytes Absolute: 2.5 10*3/uL — ABNORMAL HIGH (ref 0.1–1.0)
Monocytes Relative: 2 %
Neutro Abs: 88.9 10*3/uL — ABNORMAL HIGH (ref 1.7–7.7)
Neutrophils Relative %: 71 %
Platelet Count: 108 10*3/uL — ABNORMAL LOW (ref 150–400)
RBC: 2.85 MIL/uL — ABNORMAL LOW (ref 3.87–5.11)
RDW: 24.2 % — ABNORMAL HIGH (ref 11.5–15.5)
Smear Review: NORMAL
WBC Count: 124.8 10*3/uL (ref 4.0–10.5)
nRBC: 0 % (ref 0.0–0.2)

## 2021-06-13 LAB — SAMPLE TO BLOOD BANK

## 2021-06-13 LAB — CMP (CANCER CENTER ONLY)
ALT: 34 U/L (ref 0–44)
AST: 28 U/L (ref 15–41)
Albumin: 3.6 g/dL (ref 3.5–5.0)
Alkaline Phosphatase: 170 U/L — ABNORMAL HIGH (ref 38–126)
Anion gap: 7 (ref 5–15)
BUN: 15 mg/dL (ref 8–23)
CO2: 27 mmol/L (ref 22–32)
Calcium: 9.5 mg/dL (ref 8.9–10.3)
Chloride: 102 mmol/L (ref 98–111)
Creatinine: 0.62 mg/dL (ref 0.44–1.00)
GFR, Estimated: >60 mL/min (ref >60)
Glucose, Bld: 151 mg/dL — ABNORMAL HIGH (ref 70–99)
Potassium: 4.7 mmol/L (ref 3.5–5.1)
Sodium: 136 mmol/L (ref 135–145)
Total Bilirubin: 0.6 mg/dL (ref 0.3–1.2)
Total Protein: 7.2 g/dL (ref 6.5–8.1)

## 2021-06-13 MED ORDER — DARBEPOETIN ALFA 500 MCG/ML IJ SOSY
500.0000 ug | PREFILLED_SYRINGE | Freq: Once | INTRAMUSCULAR | Status: AC
  Administered 2021-06-13: 500 ug via SUBCUTANEOUS
  Filled 2021-06-13: qty 1

## 2021-06-13 NOTE — Progress Notes (Signed)
ID: Tanya Mitchell OB: 1946/01/12  MR#: [id number]  TGP#:[id number] ? ?Patient Care Team: ?Benay Pike, MD as PCP - General (Hematology and Oncology) ?Burtis Junes, NP (Inactive) as Nurse Practitioner (Nurse Practitioner) ?Bobbye Charleston, MD as Consulting Physician (Obstetrics and Gynecology) ?Janyth Contes, MD as Referring Physician (Hematology and Oncology) ? ? ?CHIEF COMPLAINTS:   1)  Hx Left Breast Cancer 1995 (s/p left mastectomy) ?      2) Hx Right Breast Cancer 2007 ?                  3)  Hx Melanoma, Stage IIIA ?      4) MPN with leukocytosis/thrombocytosis     ? ?CURRENT TREATMENT: hydrea; aranesp; allopurinol ? ?INTERVAL HISTORY: ?Tanya Mitchell returns today for follow-up of her MPN. ?She is currently on hydrea 500 mg PO every other day ?She is feeling great today. She had a good time in [place]. ?She didn't receive darbopoetin last week, not sure why. ?Rest of the pertinent 10 point ROS reviewed and negative. ? ?REVIEW OF SYSTEMS: ?A detailed review of systems was otherwise stable ? ? COVID 19 VACCINATION STATUS: [place] x2, most recently 04/2019; had COVID May 2022 ? ? ?HISTORY OF PRESENT ILLNESS: ?From the earlier summary: ? ?Tanya Mitchell's history of breast cancer dates back to June of 1995, when she had left modified radical mastectomy under [facility] for a pT1c pN1, stage IIA invasive ductal carcinoma, grade 2, estrogen receptor 97% positive, progesterone receptor 45% positive, treated according to CALGB 9394, sequential arm (doxorubicin x3, fourth dose apparently omitted, followed by high-dose cyclophosphamide x3), followed by tamoxifen for 5 years. There has been no evidence of disease recurrence. ? ?Further, in October of 2007 she underwent right lumpectomy for a ductal carcinoma in situ, high-grade, estrogen and progesterone receptor negative, followed by adjuvant radiation. ? ?In July of 2013 the patient was noted to have an irregular mole in her left upper back. It is not clear to me whether  this might be related to her prior left breast irradiation. Shave biopsy of this area 09/15/2011 by Dr. Derrel Nip [phone]) showed a superficial spreading malignant melanoma, with Breslow depth 1.45 mm, Clark's level IV. There was brisk host response and 8 mitoses per high power field were noted. There was focal regression but no definitive ulceration. There was no satellitosis. There was no vascular and urgent. Margins were positive, but cleared by wide excision under Dr Stark Klein 10/10/2011. There was some residual malignant melanoma in situ but margins to 2 cm were obtained, with advancement flap closure of the skin defect. In addition left axillary sentinel lymph node mapping was performed showing metastatic melanoma in the single lymph node removed. ? ?Tanya Mitchell sought a second opinion at [facility] under Dr Joaquim Lai Collichio. There was a full discussion regarding completion nodal resection, use of adjuvant interferon, and BRAF testing with a view to participation in a research protocol then available. The patient considered all these options and after much discussion the decision was made not to pursue full nodal dissection, partly because the area in question had a ready undergone surgery and radiation. The patient opted against interferon adjuvant therapy because of its very marginal benefit and significant side effects. She declined consideration of a research study and BRAF testing was not performed. ? ?Her subsequent history is as detailed below. ? ? ?PAST MEDICAL HISTORY: ?Past Medical History:  ?Diagnosis Date  ? Breast cancer ([place]) 1995 and 2007  ? Cardiomyopathy secondary to chemotherapy [facility])   ?  has improved   ? Hyperlipidemia   ? Hypertension   ? under control, has been on med. x 3 yrs.  ? Melanoma in situ of back ([facility]) 09/2011  ? left  ? Personal history of chemotherapy   ? Personal history of radiation therapy   ? Skin cancer 2013  ? Melanoma  ? ? ?PAST SURGICAL HISTORY: ?Past Surgical History:   ?Procedure Laterality Date  ? BREAST LUMPECTOMY Right 12/21/2005  ? right  ? MASTECTOMY Left 1995  ? with breast reconstruction - left  ? RIGHT/LEFT HEART CATH AND CORONARY ANGIOGRAPHY N/A 04/20/2021  ? Procedure: RIGHT/LEFT HEART CATH AND CORONARY ANGIOGRAPHY;  Surgeon: Varanasi, Jayadeep S, MD;  Location: [facility] INVASIVE CV LAB;  Service: Cardiovascular;  Laterality: N/A;  ? ? ?FAMILY HISTORY ?Family History  ?Problem Relation Age of Onset  ? Lung cancer Father   ? Cancer Father   ?     lung  ? Autism Son   ? Breast cancer Daughter   ? the patient's father died from complications of lung cancer at the age of 60. He was a heavy smoker. The patient's mother died at the age of 97. Tanya Mitchell had no siblings. She underwent genetic testing for breast and ovarian cancer panel April of 2014. There were no demonstrable mutations in the BRCA or the other genes in the panel ? ? ?GYNECOLOGIC HISTORY:  ?Menarche age 13, first live birth age 23. She is GX P2. She entered menopause at age 40, when she received her chemotherapy. She did not use hormone replacement. She did use birth control remotely, for approximately 14 years, with no complications. ? ? ?SOCIAL HISTORY:   (Updated 06/03/2013) ?Tanya Mitchell used to work at [employer], and she is still "fills in" there part-time.  She also volunteered at the [facility]. 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  They recently me moved to a new home in the [place] area.  Tanya Mitchell's son Tanya Mitchell is handicapped, and works as an artist. Daughter Tanya Mitchell is director of operations of [employer]. Lilliah has 3 grandchildren and Tanya Mitchell 2. Autry grew up in an [employer]/ [employer] denomination ? ? ADVANCED DIRECTIVES: In place ? ? ?HEALTH MAINTENANCE:  ?Social History  ? ?Tobacco Use  ? Smoking status: Former  ?  Types: Cigarettes  ?  Quit date: 02/14/1964  ?  Years since quitting: 57.3  ? Smokeless tobacco: Never  ?Vaping  Use  ? Vaping Use: Never used  ?Substance Use Topics  ? Alcohol use: Yes  ?  Comment: daily glass of wine  ? Drug use: No  ? ? ? Colonoscopy: 2011 ? PAP: November 2014 ? Bone density: January 2013, osteopenia ? Lipid panel:   August 2013/Dr. Barnes ?  ?No Known Allergies ? ?Current Outpatient Medications  ?Medication Sig Dispense Refill  ? albuterol (VENTOLIN HFA) 108 (90 Base) MCG/ACT inhaler Inhale 1-2 puffs into the lungs every 6 (six) hours as needed for shortness of breath or wheezing.    ? allopurinol (ZYLOPRIM) 300 MG tablet Take 1 tablet (300 mg total) by mouth daily. 60 tablet 6  ? aspirin EC 81 MG tablet Take 1 tablet (81 mg total) by mouth daily. Swallow whole. 90 tablet 3  ? atorvastatin (LIPITOR) 80 MG tablet Take 1 tablet (80 mg total) by mouth daily. 90 tablet 2  ? carvedilol (COREG) 3.125 MG tablet Take 1 tablet (3.125 mg total) by mouth 2 (two) times daily.   180 tablet 1  ? cholecalciferol (VITAMIN D) 1000 UNITS tablet Take 2,000 Units by mouth in the morning.    ? furosemide (LASIX) 40 MG tablet Take 1 tablet (40 mg total) by mouth daily as needed. 90 tablet 1  ? hydroxyurea (HYDREA) 500 MG capsule Take 500 mg by mouth every other day. In the evening. May take with food to minimize GI side effects.    ? Magnesium 400 MG TABS Take 1 tablet by mouth daily as needed. Only take on days taking furosemide 90 tablet 1  ? metFORMIN (GLUCOPHAGE) 500 MG tablet Take 0.5 tablets (250 mg total) by mouth in the morning and at bedtime.    ? potassium chloride SA (KLOR-CON M) 20 MEQ tablet Take 1 tablet (20 mEq total) by mouth daily as needed. Only on days taking furosemide 90 tablet 1  ? sacubitril-valsartan (ENTRESTO) 24-26 MG Take 1 tablet by mouth 2 (two) times daily. 60 tablet 3  ? spironolactone (ALDACTONE) 25 MG tablet Take 1 tablet (25 mg total) by mouth daily. 90 tablet 0  ? triamcinolone (KENALOG) 0.025 % ointment Apply topically as needed.    ? ?No current facility-administered medications for this  visit.  ? ? ?OBJECTIVE: White woman who appears younger than stated age ? ?Vitals:  ? 06/13/21 0909  ?BP: (!) 112/48  ?Pulse: 95  ?Resp: 16  ?Temp: (!) 97.5 ?F (36.4 ?C)  ?SpO2: 100%  ? ? ?   Body mass index is

## 2021-06-13 NOTE — Progress Notes (Signed)
Hgb is 9.2 and asymptomatic. No blood, injection only today per Dr. Chryl Heck.  ?

## 2021-06-13 NOTE — Patient Instructions (Signed)
Darbepoetin Alfa injection ?What is this medication? ?DARBEPOETIN ALFA (dar be POE e tin  AL fa) helps your body make more red blood cells. It is used to treat anemia caused by chronic kidney failure and chemotherapy. ?This medicine may be used for other purposes; ask your health care provider or pharmacist if you have questions. ?COMMON BRAND NAME(S): Aranesp ?What should I tell my care team before I take this medication? ?They need to know if you have any of these conditions: ?blood clotting disorders or history of blood clots ?cancer patient not on chemotherapy ?cystic fibrosis ?heart disease, such as angina, heart failure, or a history of a heart attack ?hemoglobin level of 12 g/dL or greater ?high blood pressure ?low levels of folate, iron, or vitamin B12 ?seizures ?an unusual or allergic reaction to darbepoetin, erythropoietin, albumin, hamster proteins, latex, other medicines, foods, dyes, or preservatives ?pregnant or trying to get pregnant ?breast-feeding ?How should I use this medication? ?This medicine is for injection into a vein or under the skin. It is usually given by a health care professional in a hospital or clinic setting. ?If you get this medicine at home, you will be taught how to prepare and give this medicine. Use exactly as directed. Take your medicine at regular intervals. Do not take your medicine more often than directed. ?It is important that you put your used needles and syringes in a special sharps container. Do not put them in a trash can. If you do not have a sharps container, call your pharmacist or healthcare provider to get one. ?A special MedGuide will be given to you by the pharmacist with each prescription and refill. Be sure to read this information carefully each time. ?Talk to your pediatrician regarding the use of this medicine in children. While this medicine may be used in children as young as 1 month of age for selected conditions, precautions do apply. ?Overdosage: If  you think you have taken too much of this medicine contact a poison control center or emergency room at once. ?NOTE: This medicine is only for you. Do not share this medicine with others. ?What if I miss a dose? ?If you miss a dose, take it as soon as you can. If it is almost time for your next dose, take only that dose. Do not take double or extra doses. ?What may interact with this medication? ?Do not take this medicine with any of the following medications: ?epoetin alfa ?This list may not describe all possible interactions. Give your health care provider a list of all the medicines, herbs, non-prescription drugs, or dietary supplements you use. Also tell them if you smoke, drink alcohol, or use illegal drugs. Some items may interact with your medicine. ?What should I watch for while using this medication? ?Your condition will be monitored carefully while you are receiving this medicine. ?You may need blood work done while you are taking this medicine. ?This medicine may cause a decrease in vitamin B6. You should make sure that you get enough vitamin B6 while you are taking this medicine. Discuss the foods you eat and the vitamins you take with your health care professional. ?What side effects may I notice from receiving this medication? ?Side effects that you should report to your doctor or health care professional as soon as possible: ?allergic reactions like skin rash, itching or hives, swelling of the face, lips, or tongue ?breathing problems ?changes in vision ?chest pain ?confusion, trouble speaking or understanding ?feeling faint or lightheaded, falls ?high blood   pressure ?muscle aches or pains ?pain, swelling, warmth in the leg ?rapid weight gain ?severe headaches ?sudden numbness or weakness of the face, arm or leg ?trouble walking, dizziness, loss of balance or coordination ?seizures (convulsions) ?swelling of the ankles, feet, hands ?unusually weak or tired ?Side effects that usually do not require  medical attention (report to your doctor or health care professional if they continue or are bothersome): ?diarrhea ?fever, chills (flu-like symptoms) ?headaches ?nausea, vomiting ?redness, stinging, or swelling at site where injected ?This list may not describe all possible side effects. Call your doctor for medical advice about side effects. You may report side effects to FDA at 1-800-FDA-1088. ?Where should I keep my medication? ?Keep out of the reach of children and pets. ?Store in a refrigerator. Do not freeze. Do not shake. Throw away any unused portion if using a single-dose vial. Multi-dose vials can be kept in the refrigerator for up to 21 days after the initial dose. Throw away unused medicine. ?To get rid of medications that are no longer needed or have expired: ?Take the medication to a medication take-back program. Check with your pharmacy or law enforcement to find a location. ?If you cannot return the medication, ask your pharmacist or care team how to get rid of the medication safely. ?NOTE: This sheet is a summary. It may not cover all possible information. If you have questions about this medicine, talk to your doctor, pharmacist, or health care provider. ?? 2023 Elsevier/Gold Standard (2021-01-31 00:00:00) ? ?

## 2021-06-15 DIAGNOSIS — H5201 Hypermetropia, right eye: Secondary | ICD-10-CM | POA: Diagnosis not present

## 2021-06-27 ENCOUNTER — Other Ambulatory Visit: Payer: Self-pay | Admitting: *Deleted

## 2021-06-27 DIAGNOSIS — Z79899 Other long term (current) drug therapy: Secondary | ICD-10-CM

## 2021-06-27 DIAGNOSIS — I5022 Chronic systolic (congestive) heart failure: Secondary | ICD-10-CM

## 2021-06-27 DIAGNOSIS — I10 Essential (primary) hypertension: Secondary | ICD-10-CM

## 2021-06-27 DIAGNOSIS — I428 Other cardiomyopathies: Secondary | ICD-10-CM

## 2021-06-27 MED ORDER — SPIRONOLACTONE 25 MG PO TABS: 25.0000 mg | ORAL_TABLET | Freq: Every day | ORAL | 0 refills | Status: DC

## 2021-06-28 ENCOUNTER — Inpatient Hospital Stay: Payer: Medicare Other

## 2021-06-28 ENCOUNTER — Other Ambulatory Visit: Payer: Self-pay

## 2021-06-28 ENCOUNTER — Other Ambulatory Visit: Payer: Self-pay | Admitting: *Deleted

## 2021-06-28 ENCOUNTER — Telehealth: Payer: Self-pay

## 2021-06-28 VITALS — BP 91/44 | HR 88 | Temp 98.4°F | Resp 17

## 2021-06-28 DIAGNOSIS — D649 Anemia, unspecified: Secondary | ICD-10-CM

## 2021-06-28 DIAGNOSIS — D471 Chronic myeloproliferative disease: Secondary | ICD-10-CM

## 2021-06-28 DIAGNOSIS — D469 Myelodysplastic syndrome, unspecified: Secondary | ICD-10-CM

## 2021-06-28 DIAGNOSIS — D46Z Other myelodysplastic syndromes: Secondary | ICD-10-CM | POA: Diagnosis not present

## 2021-06-28 LAB — CMP (CANCER CENTER ONLY)
ALT: 29 U/L (ref 0–44)
AST: 26 U/L (ref 15–41)
Albumin: 3.5 g/dL (ref 3.5–5.0)
Alkaline Phosphatase: 182 U/L — ABNORMAL HIGH (ref 38–126)
Anion gap: 10 (ref 5–15)
BUN: 24 mg/dL — ABNORMAL HIGH (ref 8–23)
CO2: 24 mmol/L (ref 22–32)
Calcium: 9.2 mg/dL (ref 8.9–10.3)
Chloride: 100 mmol/L (ref 98–111)
Creatinine: 0.67 mg/dL (ref 0.44–1.00)
GFR, Estimated: >60 mL/min (ref >60)
Glucose, Bld: 165 mg/dL — ABNORMAL HIGH (ref 70–99)
Potassium: 4.5 mmol/L (ref 3.5–5.1)
Sodium: 134 mmol/L — ABNORMAL LOW (ref 135–145)
Total Bilirubin: 0.5 mg/dL (ref 0.3–1.2)
Total Protein: 7.2 g/dL (ref 6.5–8.1)

## 2021-06-28 LAB — CBC WITH DIFFERENTIAL (CANCER CENTER ONLY)
Abs Immature Granulocytes: 25.12 10*3/uL — ABNORMAL HIGH (ref 0.00–0.07)
Basophils Absolute: 0.1 10*3/uL (ref 0.0–0.1)
Basophils Relative: 0 %
Eosinophils Absolute: 0 10*3/uL (ref 0.0–0.5)
Eosinophils Relative: 0 %
HCT: 21.9 % — ABNORMAL LOW (ref 36.0–46.0)
Hemoglobin: 7.7 g/dL — ABNORMAL LOW (ref 12.0–15.0)
Immature Granulocytes: 22 %
Lymphocytes Relative: 4 %
Lymphs Abs: 4.1 10*3/uL — ABNORMAL HIGH (ref 0.7–4.0)
MCH: 33.5 pg (ref 26.0–34.0)
MCHC: 35.2 g/dL (ref 30.0–36.0)
MCV: 95.2 fL (ref 80.0–100.0)
Monocytes Absolute: 2.2 10*3/uL — ABNORMAL HIGH (ref 0.1–1.0)
Monocytes Relative: 2 %
Neutro Abs: 81.3 10*3/uL — ABNORMAL HIGH (ref 1.7–7.7)
Neutrophils Relative %: 72 %
Platelet Count: 148 10*3/uL — ABNORMAL LOW (ref 150–400)
RBC: 2.3 MIL/uL — ABNORMAL LOW (ref 3.87–5.11)
RDW: 25.8 % — ABNORMAL HIGH (ref 11.5–15.5)
Smear Review: NORMAL
WBC Count: 112.9 10*3/uL (ref 4.0–10.5)
WBC Morphology: 2
nRBC: 0 % (ref 0.0–0.2)

## 2021-06-28 LAB — PREPARE RBC (CROSSMATCH)

## 2021-06-28 LAB — SAMPLE TO BLOOD BANK

## 2021-06-28 MED ORDER — ACETAMINOPHEN 325 MG PO TABS
650.0000 mg | ORAL_TABLET | Freq: Once | ORAL | Status: AC
  Administered 2021-06-28: 650 mg via ORAL
  Filled 2021-06-28: qty 2

## 2021-06-28 MED ORDER — ACETAMINOPHEN 325 MG PO TABS: 650.0000 mg | ORAL_TABLET | Freq: Once | ORAL | Status: DC

## 2021-06-28 MED ORDER — DIPHENHYDRAMINE HCL 25 MG PO CAPS: 25.0000 mg | ORAL_CAPSULE | Freq: Once | ORAL | Status: DC

## 2021-06-28 MED ORDER — SODIUM CHLORIDE 0.9% IV SOLUTION: 250.0000 mL | Freq: Once | INTRAVENOUS | Status: DC

## 2021-06-28 MED ORDER — SODIUM CHLORIDE 0.9 % IV SOLN: Freq: Once | INTRAVENOUS | Status: AC

## 2021-06-28 MED ORDER — DARBEPOETIN ALFA 500 MCG/ML IJ SOSY
500.0000 ug | PREFILLED_SYRINGE | Freq: Once | INTRAMUSCULAR | Status: AC
  Administered 2021-06-28: 500 ug via SUBCUTANEOUS
  Filled 2021-06-28: qty 1

## 2021-06-28 MED ORDER — DIPHENHYDRAMINE HCL 25 MG PO CAPS
25.0000 mg | ORAL_CAPSULE | Freq: Once | ORAL | Status: AC
  Administered 2021-06-28: 25 mg via ORAL
  Filled 2021-06-28: qty 1

## 2021-06-28 NOTE — Telephone Encounter (Signed)
Blood orders ° °

## 2021-06-29 LAB — TYPE AND SCREEN
ABO/RH(D): AB NEG
Antibody Screen: NEGATIVE
Unit division: 0

## 2021-06-29 LAB — BPAM RBC
Blood Product Expiration Date: 202305162359
ISSUE DATE / TIME: 202305161038
Unit Type and Rh: 1700

## 2021-07-04 ENCOUNTER — Telehealth: Payer: Self-pay | Admitting: Cardiology

## 2021-07-04 DIAGNOSIS — I5022 Chronic systolic (congestive) heart failure: Secondary | ICD-10-CM

## 2021-07-04 DIAGNOSIS — I10 Essential (primary) hypertension: Secondary | ICD-10-CM

## 2021-07-04 DIAGNOSIS — I428 Other cardiomyopathies: Secondary | ICD-10-CM

## 2021-07-04 DIAGNOSIS — Z79899 Other long term (current) drug therapy: Secondary | ICD-10-CM

## 2021-07-04 MED ORDER — SPIRONOLACTONE 25 MG PO TABS: 25.0000 mg | ORAL_TABLET | Freq: Every day | ORAL | 3 refills | Status: DC

## 2021-07-04 NOTE — Telephone Encounter (Signed)
*  STAT* If patient is at the pharmacy, call can be transferred to refill team.   1. Which medications need to be refilled? (please list name of each medication and dose if known)  spironolactone (ALDACTONE) 25 MG tablet  2. Which pharmacy/location (including street and city if local pharmacy) is medication to be sent to? [place], [place] - [address] AT [place]  3. Do they need a 30 day or 90 day supply? 90 day supply     Pharmacy advised pt they have not received prescription.

## 2021-07-07 ENCOUNTER — Other Ambulatory Visit: Payer: Self-pay

## 2021-07-07 DIAGNOSIS — Z79899 Other long term (current) drug therapy: Secondary | ICD-10-CM

## 2021-07-07 DIAGNOSIS — I5022 Chronic systolic (congestive) heart failure: Secondary | ICD-10-CM

## 2021-07-07 DIAGNOSIS — Z01812 Encounter for preprocedural laboratory examination: Secondary | ICD-10-CM

## 2021-07-07 MED ORDER — ENTRESTO 24-26 MG PO TABS: 1.0000 | ORAL_TABLET | Freq: Two times a day (BID) | ORAL | 10 refills | Status: DC

## 2021-07-12 ENCOUNTER — Inpatient Hospital Stay: Payer: Medicare Other

## 2021-07-12 ENCOUNTER — Other Ambulatory Visit: Payer: Self-pay

## 2021-07-12 ENCOUNTER — Encounter: Payer: Self-pay | Admitting: Hematology and Oncology

## 2021-07-12 ENCOUNTER — Other Ambulatory Visit: Payer: Self-pay | Admitting: *Deleted

## 2021-07-12 ENCOUNTER — Inpatient Hospital Stay (HOSPITAL_BASED_OUTPATIENT_CLINIC_OR_DEPARTMENT_OTHER): Payer: Medicare Other | Admitting: Hematology and Oncology

## 2021-07-12 VITALS — BP 104/43 | HR 96 | Temp 97.9°F | Resp 18 | Ht 62.0 in | Wt 107.6 lb

## 2021-07-12 VITALS — BP 94/46 | HR 88 | Temp 98.2°F | Resp 16

## 2021-07-12 DIAGNOSIS — D469 Myelodysplastic syndrome, unspecified: Secondary | ICD-10-CM

## 2021-07-12 DIAGNOSIS — D619 Aplastic anemia, unspecified: Secondary | ICD-10-CM

## 2021-07-12 DIAGNOSIS — D471 Chronic myeloproliferative disease: Secondary | ICD-10-CM

## 2021-07-12 DIAGNOSIS — D46Z Other myelodysplastic syndromes: Secondary | ICD-10-CM | POA: Diagnosis not present

## 2021-07-12 LAB — CBC WITH DIFFERENTIAL (CANCER CENTER ONLY)
Abs Immature Granulocytes: 28.7 10*3/uL — ABNORMAL HIGH (ref 0.00–0.07)
Band Neutrophils: 12 %
Basophils Absolute: 0 10*3/uL (ref 0.0–0.1)
Basophils Relative: 0 %
Blasts: 2 %
Eosinophils Absolute: 0 10*3/uL (ref 0.0–0.5)
Eosinophils Relative: 0 %
HCT: 21.4 % — ABNORMAL LOW (ref 36.0–46.0)
Hemoglobin: 7.3 g/dL — ABNORMAL LOW (ref 12.0–15.0)
Lymphocytes Relative: 1 %
Lymphs Abs: 1 10*3/uL (ref 0.7–4.0)
MCH: 33 pg (ref 26.0–34.0)
MCHC: 34.1 g/dL (ref 30.0–36.0)
MCV: 96.8 fL (ref 80.0–100.0)
Metamyelocytes Relative: 19 %
Monocytes Absolute: 1 10*3/uL (ref 0.1–1.0)
Monocytes Relative: 1 %
Myelocytes: 11 %
Neutro Abs: 63.1 10*3/uL — ABNORMAL HIGH (ref 1.7–7.7)
Neutrophils Relative %: 54 %
Platelet Count: 134 10*3/uL — ABNORMAL LOW (ref 150–400)
RBC: 2.21 MIL/uL — ABNORMAL LOW (ref 3.87–5.11)
RDW: 26.1 % — ABNORMAL HIGH (ref 11.5–15.5)
WBC Count: 95.6 10*3/uL (ref 4.0–10.5)
nRBC: 0 % (ref 0.0–0.2)

## 2021-07-12 LAB — SAMPLE TO BLOOD BANK

## 2021-07-12 LAB — CMP (CANCER CENTER ONLY)
ALT: 27 U/L (ref 0–44)
AST: 21 U/L (ref 15–41)
Albumin: 3.5 g/dL (ref 3.5–5.0)
Alkaline Phosphatase: 167 U/L — ABNORMAL HIGH (ref 38–126)
Anion gap: 7 (ref 5–15)
BUN: 18 mg/dL (ref 8–23)
CO2: 25 mmol/L (ref 22–32)
Calcium: 9.5 mg/dL (ref 8.9–10.3)
Chloride: 101 mmol/L (ref 98–111)
Creatinine: 0.66 mg/dL (ref 0.44–1.00)
GFR, Estimated: >60 mL/min (ref >60)
Glucose, Bld: 157 mg/dL — ABNORMAL HIGH (ref 70–99)
Potassium: 4.7 mmol/L (ref 3.5–5.1)
Sodium: 133 mmol/L — ABNORMAL LOW (ref 135–145)
Total Bilirubin: 0.4 mg/dL (ref 0.3–1.2)
Total Protein: 6.9 g/dL (ref 6.5–8.1)

## 2021-07-12 LAB — PREPARE RBC (CROSSMATCH)

## 2021-07-12 MED ORDER — DARBEPOETIN ALFA 500 MCG/ML IJ SOSY
500.0000 ug | PREFILLED_SYRINGE | Freq: Once | INTRAMUSCULAR | Status: AC
  Administered 2021-07-12: 500 ug via SUBCUTANEOUS
  Filled 2021-07-12: qty 1

## 2021-07-12 MED ORDER — SODIUM CHLORIDE 0.9% IV SOLUTION
250.0000 mL | Freq: Once | INTRAVENOUS | Status: AC
  Administered 2021-07-12: 250 mL via INTRAVENOUS

## 2021-07-12 NOTE — Patient Instructions (Signed)
Blood Transfusion, Adult A blood transfusion is a procedure in which you receive blood or a type of blood cell (blood component) through an IV. You may need a blood transfusion when your blood level is low. This may result from a bleeding disorder, illness, injury, or surgery. The blood may come from a donor. You may also be able to donate blood for yourself (autologous blood donation) before a planned surgery. The blood given in a transfusion is made up of different blood components. You may receive: Red blood cells. These carry oxygen to the cells in the body. Platelets. These help your blood to clot. Plasma. This is the liquid part of your blood. It carries proteins and other substances throughout the body. White blood cells. These help you fight infections. If you have hemophilia or another clotting disorder, you may also receive other types of blood products. Tell a health care provider about: Any blood disorders you have. Any previous reactions you have had during a blood transfusion. Any allergies you have. All medicines you are taking, including vitamins, herbs, eye drops, creams, and over-the-counter medicines. Any surgeries you have had. Any medical conditions you have, including any recent fever or cold symptoms. Whether you are pregnant or may be pregnant. What are the risks? Generally, this is a safe procedure. However, problems may occur. The most common problems include: A mild allergic reaction, such as red, swollen areas of skin (hives) and itching. Fever or chills. This may be the body's response to new blood cells received. This may occur during or up to 4 hours after the transfusion. More serious problems may include: Transfusion-associated circulatory overload (TACO), or too much fluid in the lungs. This may cause breathing problems. A serious allergic reaction, such as difficulty breathing or swelling around the face and lips. Transfusion-related acute lung injury  (TRALI), which causes breathing difficulty and low oxygen in the blood. This can occur within hours of the transfusion or several days later. Iron overload. This can happen after receiving many blood transfusions over a period of time. Infection or virus being transmitted. This is rare because donated blood is carefully tested before it is given. Hemolytic transfusion reaction. This is rare. It happens when your body's defense system (immune system)tries to attack the new blood cells. Symptoms may include fever, chills, nausea, low blood pressure, and low back or chest pain. Transfusion-associated graft-versus-host disease (TAGVHD). This is rare. It happens when donated cells attack your body's healthy tissues. What happens before the procedure? Medicines Ask your health care provider about: Changing or stopping your regular medicines. This is especially important if you are taking diabetes medicines or blood thinners. Taking medicines such as aspirin and ibuprofen. These medicines can thin your blood. Do not take these medicines unless your health care provider tells you to take them. Taking over-the-counter medicines, vitamins, herbs, and supplements. General instructions Follow instructions from your health care provider about eating and drinking restrictions. You will have a blood test to determine your blood type. This is necessary to know what kind of blood your body will accept and to match it to the donor blood. If you are going to have a planned surgery, you may be able to do an autologous blood donation. This may be done in case you need to have a transfusion. You will have your temperature, blood pressure, and pulse monitored before the transfusion. If you have had an allergic reaction to a transfusion in the past, you may be given medicine to help prevent   a reaction. This medicine may be given to you by mouth (orally) or through an IV. Set aside time for the blood transfusion. This  procedure generally takes 1-4 hours to complete. What happens during the procedure?  An IV will be inserted into one of your veins. The bag of donated blood will be attached to your IV. The blood will then enter through your vein. Your temperature, blood pressure, and pulse will be monitored regularly during the transfusion. This monitoring is done to detect early signs of a transfusion reaction. Tell your nurse right away if you have any of these symptoms during the transfusion: Shortness of breath or trouble breathing. Chest or back pain. Fever or chills. Hives or itching. If you have any signs or symptoms of a reaction, your transfusion will be stopped and you may be given medicine. When the transfusion is complete, your IV will be removed. Pressure may be applied to the IV site for a few minutes. A bandage (dressing)will be applied. The procedure may vary among health care providers and hospitals. What happens after the procedure? Your temperature, blood pressure, pulse, breathing rate, and blood oxygen level will be monitored until you leave the hospital or clinic. Your blood may be tested to see how you are responding to the transfusion. You may be warmed with fluids or blankets to maintain a normal body temperature. If you receive your blood transfusion in an outpatient setting, you will be told whom to contact to report any reactions. Where to find more information For more information on blood transfusions, visit the American Red Cross: redcross.org Summary A blood transfusion is a procedure in which you receive blood or a type of blood cell (blood component) through an IV. The blood you receive may come from a donor or be donated by yourself (autologous blood donation) before a planned surgery. The blood given in a transfusion is made up of different blood components. You may receive red blood cells, platelets, plasma, or white blood cells depending on the condition treated. Your  temperature, blood pressure, and pulse will be monitored before, during, and after the transfusion. After the transfusion, your blood may be tested to see how your body has responded. This information is not intended to replace advice given to you by your health care provider. Make sure you discuss any questions you have with your health care provider. Document Revised: 12/05/2018 Document Reviewed: 07/25/2018 Elsevier Patient Education  2022 Elsevier Inc.  

## 2021-07-12 NOTE — Progress Notes (Signed)
ID: Tanya Mitchell OB: 17-Oct-1945  MR#: [id number]  CSN#:[id number]  Patient Care Team: Tanya Pike, MD as PCP - General (Hematology and Oncology) Tanya Junes, NP (Inactive) as Nurse Practitioner (Nurse Practitioner) Tanya Charleston, MD as Consulting Physician (Obstetrics and Gynecology) Tanya Contes, MD as Referring Physician (Hematology and Oncology)   CHIEF COMPLAINTS:   1)  Hx Left Breast Cancer 1995 (s/p left mastectomy)       2) Hx Right Breast Cancer 2007                   3)  Hx Melanoma, Stage IIIA       4) MPN with leukocytosis/thrombocytosis      CURRENT TREATMENT: hydrea; aranesp; allopurinol  INTERVAL HISTORY: Tanya Mitchell returns today for follow-up of her MPN. She is currently on hydrea 500 mg daily She is feeling energetic No B symptoms. NO change in breathing. NO change in bowel habits or urinary habits. Rest of the pertinent 10 points ROS reviewed and negative.   REVIEW OF SYSTEMS: A detailed review of systems was otherwise stable   COVID 19 VACCINATION STATUS: [place] x2, most recently 04/2019; had COVID May 2022   HISTORY OF PRESENT ILLNESS: From the earlier summary:  Tanya Mitchell's history of breast cancer dates back to June of 1995, when she had left modified radical mastectomy under Tanya Mitchell for a pT1c pN1, stage IIA invasive ductal carcinoma, grade 2, estrogen receptor 97% positive, progesterone receptor 45% positive, treated according to CALGB 9394, sequential arm (doxorubicin x3, fourth dose apparently omitted, followed by high-dose cyclophosphamide x3), followed by tamoxifen for 5 years. There has been no evidence of disease recurrence.  Further, in October of 2007 she underwent right lumpectomy for a ductal carcinoma in situ, high-grade, estrogen and progesterone receptor negative, followed by adjuvant radiation.  In July of 2013 the patient was noted to have an irregular mole in her left upper back. It is not clear to me whether this might be  related to her prior left breast irradiation. Shave biopsy of this area 09/15/2011 by Tanya. Derrel Mitchell [id number]) showed a superficial spreading malignant melanoma, with Breslow depth 1.45 mm, Clark's level IV. There was brisk host response and 8 mitoses per high power field were noted. There was focal regression but no definitive ulceration. There was no satellitosis. There was no vascular and urgent. Margins were positive, but cleared by wide excision under Tanya Mitchell 10/10/2011. There was some residual malignant melanoma in situ but margins to 2 cm were obtained, with advancement flap closure of the skin defect. In addition left axillary sentinel lymph node mapping was performed showing metastatic melanoma in the single lymph node removed.  Tanya Mitchell sought a second opinion at [facility] under Tanya Tanya Mitchell. There was a full discussion regarding completion nodal resection, use of adjuvant interferon, and BRAF testing with a view to participation in a research protocol then available. The patient considered all these options and after much discussion the decision was made not to pursue full nodal dissection, partly because the area in question had a ready undergone surgery and radiation. The patient opted against interferon adjuvant therapy because of its very marginal benefit and significant side effects. She declined consideration of a research study and BRAF testing was not performed.  Her subsequent history is as detailed below.   PAST MEDICAL HISTORY: Past Medical History:  Diagnosis Date   Breast cancer ([place]) 1995 and 2007   Cardiomyopathy secondary to chemotherapy [facility])    has improved  Hyperlipidemia    Hypertension    under control, has been on med. x 3 yrs.   Melanoma in situ of back ([facility]) 09/2011   left   Personal history of chemotherapy    Personal history of radiation therapy    Skin cancer 2013   Melanoma    PAST SURGICAL HISTORY: Past Surgical History:  Procedure  Laterality Date   BREAST LUMPECTOMY Right 12/21/2005   right   MASTECTOMY Left 1995   with breast reconstruction - left   RIGHT/LEFT HEART CATH AND CORONARY ANGIOGRAPHY N/A 04/20/2021   Procedure: RIGHT/LEFT HEART CATH AND CORONARY ANGIOGRAPHY;  Surgeon: Tanya Crafts, MD;  Location: [facility] INVASIVE CV LAB;  Service: Cardiovascular;  Laterality: N/A;    FAMILY HISTORY Family History  Problem Relation Age of Onset   Lung cancer Father    Cancer Father        lung   Autism Son    Breast cancer Daughter    the patient's father died from complications of lung cancer at the age of 66. He was a heavy smoker. The patient's mother died at the age of 85. Tanya Mitchell had no siblings. She underwent genetic testing for breast and ovarian cancer panel April of 2014. There were no demonstrable mutations in the BRCA or the other genes in the panel   GYNECOLOGIC HISTORY:  Menarche age 50, first live birth age 39. She is GX P2. She entered menopause at age 23, when she received her chemotherapy. She did not use hormone replacement. She did use birth control remotely, for approximately 14 years, with no complications.   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  She also volunteered at the [facility]. 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  They recently me moved [place] a new home in the [facility] area.  Tanya Mitchell's son Tanya Mitchell is handicapped, and works as an Tree surgeon. Therapist, nutritional N. Talmadge Coventry is Therapist, music limited. Tanya Mitchell has 3 grandchildren and Tanya Mitchell [place] [employer] 2. [name] grew up in an Barrister's [employer] denomination   ADVANCED DIRECTIVES: In place   HEALTH MAINTENANCE:  Social History   Tobacco Use   Smoking status: Former    Types: Cigarettes    Quit date: 02/14/1964    Years since quitting: 57.4   Smokeless tobacco: Never  Vaping Use   Vaping  Use: Never used  Substance Use Topics   Alcohol use: Yes    Comment: daily glass of wine   Drug use: No     Colonoscopy: 2011  PAP: November 2014  Bone density: January 2013, osteopenia  Lipid panel:   August 2013/Tanya. Zachery Dauer   No Known Allergies  Current Outpatient Medications  Medication Sig Dispense Refill   albuterol (VENTOLIN HFA) 108 (90 Base) MCG/ACT inhaler Inhale 1-2 puffs into the lungs every 6 (six) hours as needed for shortness of breath or wheezing.     allopurinol (ZYLOPRIM) 300 MG tablet Take 1 tablet (300 mg total) by mouth daily. 60 tablet 6   aspirin EC 81 MG tablet Take 1 tablet (81 mg total) by mouth daily. Swallow whole. 90 tablet 3   atorvastatin (LIPITOR) 80 MG tablet Take 1 tablet (80 mg total) by mouth daily. 90 tablet 2   carvedilol (COREG) 3.125 MG tablet Take 1 tablet (3.125 mg total) by mouth 2 (two) times daily. 180 tablet 1  cholecalciferol (VITAMIN D) 1000 UNITS tablet Take 2,000 Units by mouth in the morning.     furosemide (LASIX) 40 MG tablet Take 1 tablet (40 mg total) by mouth daily as needed. 90 tablet 1   hydroxyurea (HYDREA) 500 MG capsule Take 500 mg by mouth every other day. In the evening. May take with food to minimize GI side effects.     Magnesium 400 MG TABS Take 1 tablet by mouth daily as needed. Only take on days taking furosemide 90 tablet 1   metFORMIN (GLUCOPHAGE) 500 MG tablet Take 0.5 tablets (250 mg total) by mouth in the morning and at bedtime.     potassium chloride SA (KLOR-CON M) 20 MEQ tablet Take 1 tablet (20 mEq total) by mouth daily as needed. Only on days taking furosemide 90 tablet 1   sacubitril-valsartan (ENTRESTO) 24-26 MG Take 1 tablet by mouth 2 (two) times daily. 60 tablet 10   spironolactone (ALDACTONE) 25 MG tablet Take 1 tablet (25 mg total) by mouth daily. 90 tablet 3   triamcinolone (KENALOG) 0.025 % ointment Apply topically as needed.     No current facility-administered medications for this visit.     OBJECTIVE: White woman who appears younger than stated age  62:   07/12/21 0820  BP: (!) 104/43  Pulse: 96  Resp: 18  Temp: 97.9 F (36.6 C)  SpO2: 99%       Body mass index is 19.68 kg/m.    ECOG FS:1 - Symptomatic but completely ambulatory Filed Weights   07/12/21 0820  Weight: 107 lb 9.6 oz (48.8 kg)    Physical Exam Constitutional:      Appearance: Normal appearance.  Cardiovascular:     Rate and Rhythm: Normal rate and regular rhythm.  Musculoskeletal:        General: No swelling.     Cervical back: Normal range of motion and neck supple. No rigidity.  Lymphadenopathy:     Cervical: No cervical adenopathy.  Skin:    General: Skin is warm and dry.  Neurological:     General: No focal deficit present.     Mental Status: She is alert.     LAB RESULTS:   Lab Results  Component Value Date   WBC 95.6 (HH) 07/12/2021   NEUTROABS PENDING 07/12/2021   HGB 7.3 (L) 07/12/2021   HCT 21.4 (L) 07/12/2021   MCV 96.8 07/12/2021   PLT 134 (L) 07/12/2021      Chemistry      Component Value Date/Time   NA 134 (L) 06/28/2021 0801   NA 138 04/25/2021 1018   NA 139 11/30/2015 1225   K 4.5 06/28/2021 0801   K 4.6 11/30/2015 1225   CL 100 06/28/2021 0801   CL 104 01/18/2012 0948   CO2 24 06/28/2021 0801   CO2 27 11/30/2015 1225   BUN 24 (H) 06/28/2021 0801   BUN 22 04/25/2021 1018   BUN 13.1 11/30/2015 1225   CREATININE 0.67 06/28/2021 0801   CREATININE 0.7 11/30/2015 1225      Component Value Date/Time   CALCIUM 9.2 06/28/2021 0801   CALCIUM 10.0 11/30/2015 1225   ALKPHOS 182 (H) 06/28/2021 0801   ALKPHOS 83 11/30/2015 1225   AST 26 06/28/2021 0801   AST 25 11/30/2015 1225   ALT 29 06/28/2021 0801   ALT 27 11/30/2015 1225   BILITOT 0.5 06/28/2021 0801   BILITOT 0.60 11/30/2015 1225      STUDIES: No results found.   ASSESSMENT: 76  y.o. BRCA negative [place] woman   (1) status post left mastectomy with TRAM reconstruction June of 1995  for a T1c N1, stage IIA invasive ductal carcinoma, grade 2, estrogen receptor 97% positive, progesterone receptor 45% positive, treated adjuvantly according to CALGB 9394 with 3 cycles of doxorubicin followed by 3 cycles of cyclophosphamide, then tamoxifen for 5 years, off therapy completed November of 2000  (2) status post right lumpectomy October 2007 for high-grade ductal carcinoma in situ, with negative margins estrogen and progesterone receptor negative, followed by adjuvant radiation therapy   (3) malignant melanoma, T2a N1a = stage IIIA, as follows:  (a) status post shave biopsy from the right upper back 09/15/2011 for a superficial spreading melanoma, Breslow depth 1.45 mm, Clark's level IV, with focal regression but no ulceration.  (b) status post wide excision and sentinel lymph node sampling 10/10/2011 with residual malignant melanoma in situ, but negative margins; the single sentinel lymph node was involved by melanoma with the largest subcapsular deposit measuring 0.22 mm, no evidence of capsular involvement or extracapsular extension  (c) completion nodal dissection was discussed, but not performed in part because the patient had already had a complete left axillary lymph node dissection at the time of her 1995 left mastectomy (10 lymph nodes were removed at that time)  (d) adjuvant interferon was discussed with the patient when she visited [facility] in September 2013, but given that it side effects and marginal benefits the patient declined  (e) BRAF testing has not been done on the original tumor; this was also discussed with the patient at the time of her [facility] visit, as it might possibly lead to participation in a research protocol; the patient decided not to pursue that option  (4) PET scan 11/19/18/2015 shows nonspecific uptake only at T6.   (a) MRI of the thoracic spine November 2015 showed no evidence of metastatic disease  (b) repeat PET scan 12/02/2014 shows no residual or recurrent  hypermetabolic tumor.  (5) thrombocytosis first noted October 2017, leukocytosis first noted November 2019  (a) bone marrow biopsy 04/03/2018 showed a hypercellular bone marrow with granulocytic and megakaryocytic proliferation, some of the megakaryocytes being small and/or hypolobulated.  There was no increase in blasts, no significant increase in reticulin fibers  (b) cytogenetics from bone marrow biopsy 04/03/2018 showed 46,XX[20].nuc ish (ABL1, BCR)x2  (c) molecular studies 12/18/2017 showed no mutations in JAK2 exons 12 and 14 (including V 617);  MPL exon 10 or CALR exon 9  (d) BCR/ABL 1 drawn 12/18/2017 showed 94% normal nuclei (6% with single fusion)  (e) repeat BCR/ABL 1 on 04/30/2018 showed 100% normal nuclei  (f) repeat BCR/ABL 1 on 02/27/2019: Translocation not detected  (6) bone marrow biopsy on 10/31/2019 shows a hypercellular marrow with myeloid and megakaryocytic hyperplasia but no increase in blasts.  (a) cytogenetics showed no metaphase cells for analysis  (b) Next generation sequencing for myeloid disorders reveals mutations of  ASXL1, GATA2, and U2AF1. These findings support the impression of a  myeloid neoplasm.    (c) hydroxyurea 500 mg daily started March 2022 with Aranesp support every 4 weeks  (d) hydroxyurea held on 10/25/2020 secondary to progressive anemia, requiring transfusion; EPO increased to every 2 weeks  (e) hydroxyurea currently at 500 mg  daily  PLAN:  She is currently taking Hydrea and darbepoetin as instructed. She is on hydrea 500 mg daily, darbo every 2 weeks. Leukocytosis improving on daily Hydrea She will continue labs every 2 weeks, FU every 4 weeks. Hb today 7.3,  will go ahead and transfuse 1 unit PRBC today She will also receive darbopoetin today. RTC in 2 weeks for labs RTC in 4 weeks for follow up  Total encounter time 30 minutes.Tanya Pike, MD   07/12/2021 8:43 AM  Oncology and Hematology [facility] [place], [place] [address] Tel. [phone]  Joylene Igo [phone]   *Total Encounter Time as defined by the Centers for Medicare and Medicaid Services includes, in addition to the face-to-face time of a patient visit (documented in the note above) non-face-to-face time: obtaining and reviewing outside history, ordering and reviewing medications, tests or procedures, care coordination (communications with other health care professionals or caregivers) and documentation in the medical record.

## 2021-07-13 LAB — TYPE AND SCREEN
ABO/RH(D): AB NEG
Antibody Screen: NEGATIVE
Unit division: 0

## 2021-07-13 LAB — BPAM RBC
Blood Product Expiration Date: 202306072359
ISSUE DATE / TIME: 202305300952
Unit Type and Rh: 600

## 2021-07-18 ENCOUNTER — Other Ambulatory Visit: Payer: Self-pay

## 2021-07-18 MED ORDER — ATORVASTATIN CALCIUM 80 MG PO TABS: 80.0000 mg | ORAL_TABLET | Freq: Every day | ORAL | 3 refills | Status: AC

## 2021-07-19 DIAGNOSIS — M25512 Pain in left shoulder: Secondary | ICD-10-CM | POA: Diagnosis not present

## 2021-07-22 ENCOUNTER — Encounter (HOSPITAL_BASED_OUTPATIENT_CLINIC_OR_DEPARTMENT_OTHER): Payer: Self-pay | Admitting: *Deleted

## 2021-07-22 ENCOUNTER — Emergency Department (HOSPITAL_BASED_OUTPATIENT_CLINIC_OR_DEPARTMENT_OTHER)
Admission: EM | Admit: 2021-07-22 | Discharge: 2021-07-22 | Disposition: A | Discharge status: Home / Self Care | Payer: Medicare Other | Attending: Emergency Medicine | Admitting: Emergency Medicine

## 2021-07-22 ENCOUNTER — Emergency Department (HOSPITAL_BASED_OUTPATIENT_CLINIC_OR_DEPARTMENT_OTHER): Payer: Medicare Other | Admitting: Radiology

## 2021-07-22 ENCOUNTER — Other Ambulatory Visit: Payer: Self-pay

## 2021-07-22 DIAGNOSIS — Z7982 Long term (current) use of aspirin: Secondary | ICD-10-CM | POA: Insufficient documentation

## 2021-07-22 DIAGNOSIS — M25512 Pain in left shoulder: Secondary | ICD-10-CM | POA: Diagnosis not present

## 2021-07-22 NOTE — ED Triage Notes (Signed)
Left shoulder pain which began Friday night at rest.  Pain increases with movement to the shoulder and can be pinpointed to a specific spot on the joint, pain with palpation.

## 2021-07-22 NOTE — Discharge Instructions (Addendum)
Your x-ray today was negative for fracture or dislocation, however given how much pain you are in you will likely need an MRI of the shoulder joint.  I have given you referral to an orthopedic office-you can call them on Monday to schedule follow-up appointment.  Continue taking the meloxicam that you are previously prescribed and you can also add in Tylenol as well.  I hope the sling provides you some comfort, however if it does not, feel free to not wear it.

## 2021-07-22 NOTE — ED Provider Notes (Signed)
[facility] EMERGENCY DEPT Provider Note   CSN: [id number] Arrival date & time: 07/22/21  1626     History  Chief Complaint  Patient presents with   Shoulder Pain    Tanya Mitchell is a 76 y.o. female who presents to the emergency department for evaluation of left shoulder pain that began while sitting at home.  Patient is left-hand dominant and states that she was sitting on her iPad and when she turned to put it away she noticed that her left shoulder was quite painful.  This was approximately 1 week ago and pain has been relatively constant.  She notes that she is unable to lift her shoulder or her arm more than a few inches.  She is having poor sleep secondary to pain.  She saw her primary care doctor and was given meloxicam although this does not seem to be improving her symptoms.  Denies trauma or injury.  She denies numbness, tingling, chest pain, shortness of breath, abdominal pain, nausea and diarrhea.  Shoulder Pain      Home Medications Prior to Admission medications   Medication Sig Start Date End Date Taking? Authorizing Provider  albuterol (VENTOLIN HFA) 108 (90 Base) MCG/ACT inhaler Inhale 1-2 puffs into the lungs every 6 (six) hours as needed for shortness of breath or wheezing. 03/09/21   [provider]  allopurinol (ZYLOPRIM) 300 MG tablet Take 1 tablet (300 mg total) by mouth daily. 11/09/20   Magrinat, Virgie Dad, MD  aspirin EC 81 MG tablet Take 1 tablet (81 mg total) by mouth daily. Swallow whole. 04/15/21   Freada Bergeron, MD  atorvastatin (LIPITOR) 80 MG tablet Take 1 tablet (80 mg total) by mouth daily. 07/18/21   Freada Bergeron, MD  carvedilol (COREG) 3.125 MG tablet Take 1 tablet (3.125 mg total) by mouth 2 (two) times daily. 04/15/21   Freada Bergeron, MD  cholecalciferol (VITAMIN D) 1000 UNITS tablet Take 2,000 Units by mouth in the morning.    [provider]  furosemide (LASIX) 40 MG tablet Take 1 tablet (40 mg  total) by mouth daily as needed. 04/06/21   Jettie Booze, MD  hydroxyurea (HYDREA) 500 MG capsule Take 500 mg by mouth every other day. In the evening. May take with food to minimize GI side effects. 08/09/20   Magrinat, Virgie Dad, MD  Magnesium 400 MG TABS Take 1 tablet by mouth daily as needed. Only take on days taking furosemide 04/06/21   Jettie Booze, MD  metFORMIN (GLUCOPHAGE) 500 MG tablet Take 0.5 tablets (250 mg total) by mouth in the morning and at bedtime. 04/22/21   Jettie Booze, MD  potassium chloride SA (KLOR-CON M) 20 MEQ tablet Take 1 tablet (20 mEq total) by mouth daily as needed. Only on days taking furosemide 04/06/21   Jettie Booze, MD  sacubitril-valsartan (ENTRESTO) 24-26 MG Take 1 tablet by mouth 2 (two) times daily. 07/07/21   Freada Bergeron, MD  spironolactone (ALDACTONE) 25 MG tablet Take 1 tablet (25 mg total) by mouth daily. 07/04/21   Freada Bergeron, MD  triamcinolone (KENALOG) 0.025 % ointment Apply topically as needed. 03/24/21   [provider]      Allergies    Patient has no known allergies.    Review of Systems   Review of Systems  Physical Exam Updated Vital Signs BP 112/60 (BP Location: Right Arm)   Pulse 92   Temp 98.5 F (36.9 C) (Oral)  Resp 18   SpO2 100%  Physical Exam Vitals and nursing note reviewed.  Constitutional:      General: She is not in acute distress.    Appearance: She is not ill-appearing.  HENT:     Head: Atraumatic.  Eyes:     Conjunctiva/sclera: Conjunctivae normal.  Cardiovascular:     Rate and Rhythm: Normal rate and regular rhythm.     Pulses: Normal pulses.     Heart sounds: No murmur heard. Pulmonary:     Effort: Pulmonary effort is normal. No respiratory distress.     Breath sounds: Normal breath sounds.  Abdominal:     General: Abdomen is flat. There is no distension.     Palpations: Abdomen is soft.     Tenderness: There is no abdominal tenderness.   Musculoskeletal:        General: Normal range of motion.       Arms:     Cervical back: Normal range of motion.     Comments: Focal, significant and reproducible tenderness at the posterior shoulder joint.  No erythema, induration, bruising or swelling.  No obvious deformities.  Active range of motion severely diminished secondary to pain.  Passive range of motion with only modest improvement.  Subjective sensation intact.  Skin:    General: Skin is warm and dry.     Capillary Refill: Capillary refill takes less than 2 seconds.  Neurological:     General: No focal deficit present.     Mental Status: She is alert.  Psychiatric:        Mood and Affect: Mood normal.     ED Results / Procedures / Treatments   Labs (all labs ordered are listed, but only abnormal results are displayed) Labs Reviewed - No data to display  EKG None  Radiology DG Shoulder Left  Result Date: 07/22/2021 CLINICAL DATA:  pain EXAM: LEFT SHOULDER - 2+ VIEW COMPARISON:  None Available. FINDINGS: There is no evidence of fracture or dislocation. There is no evidence of arthropathy or other focal bone abnormality. Soft tissues are unremarkable. Vascular clips overlie the left shoulder. Aortic calcification. IMPRESSION: 1. No acute displaced fracture or dislocation. 2.  Aortic Atherosclerosis (ICD10-I70.0). Electronically Signed   By: Iven Finn M.D.   On: 07/22/2021 17:46    Procedures Procedures    Medications Ordered in ED Medications - No data to display  ED Course/ Medical Decision Making/ A&P                           Medical Decision Making Amount and/or Complexity of Data Reviewed Radiology: ordered.   76 year old female presents to the ED for evaluation of left shoulder pain that began approximately 1 week ago.  Differentials include fracture, dislocation, cellulitis, bursitis, septic joint, ligament tear.  Vitals without significant abnormality.  On exam, she has significant reproducible  tenderness when pressing on the posterior shoulder joint.  Active range of motion significantly diminished secondary to pain.  There is no obvious swelling, erythema or bruising.  Symptoms not consistent with septic joint or cellulitis.  I ordered interpreted x-ray of the left shoulder which was normal.  I agree with radiologist interpretation.  Given lack of trauma and location of pain, symptoms are most likely consistent with bursitis.  Since she is not having improvement with meloxicam, referral to orthopedic office was placed.  Patient expresses understanding is amenable to plan.  Discharged home in good condition. Final Clinical Impression(s) /  ED Diagnoses Final diagnoses:  Acute pain of left shoulder    Rx / DC Orders ED Discharge Orders     None         Rodena Piety 07/22/21 2158    Malvin Johns, MD 07/22/21 2337

## 2021-07-23 ENCOUNTER — Encounter: Payer: Self-pay | Admitting: Hematology and Oncology

## 2021-07-24 NOTE — Progress Notes (Signed)
Cardiology Office Note:    Date:  07/28/2021   ID:  Keith Rake Faerber, DOB 11/23/1945, MRN [id number]  PCP:  Benay Pike, MD   [facility] Providers Cardiologist:  None {  Referring MD: Freada Bergeron, MD   History of Present Illness:    Tanya Mitchell is a 76 y.o. female with a hx of prior cardiomyopathy secondary to chemo from breast cancer with improved EF, melenoma, HLD and DMII who was previously followed by Truitt Merle, NP who now presents to clinic for follow-up.  Seen in clinic 08/2020 where she was doing well with no anginal or HF symptoms. TTE 09/2020 with LVEF 45-50%, normal RV, normal PASP, severe LAE, trivial MR, no significant aortic disease.  Was seen in clinic on 03/29/21 as an urgent visit for worsening edema, weight gain, fatigue and abdominal distension. TTE with LVEF 25-30%, mild LV dilation, G2DD, mild RV dysfunction, severe LAE, mild MR, moderate pleural effusion and small pericardial effusion. We discussed hospitalization but she wanted to trial out-patient management. We started her on lasix '40mg'$  BID x5 days and then daily thereafter. We held her coreg and started spiro.   She saw Dr. Irish Lack in follow-up on 03/2021 where she had lost 30lbs. Symptoms had improved significantly and she felt back to baseline. She was resumed on coreg 6.'25mg'$  BID and her lasix was changed to prn.  She underwent RHC and LHC on 04/2021  which showed no significant CAD. RHC with Ao sat 100%, PA sat 71%, PA pressure 23/3, mean PA pressure 11 mm Hg; mean PCWP 3 mm Hg; CO 5.7 L/min; CI 3.88.   Today, the patient feels overall well. Has not required the lasix in the past couple of months. No chest pain, SOB, orthopnea or PND. Blood pressure has been running low in 80-90s at home. Was told to hold her spironolactone and coreg. No associated lightheadedness. Has been receiving blood transfusions for anemia.   Past Medical History:  Diagnosis Date   Breast cancer ([place])  1995 and 2007   Cardiomyopathy secondary to chemotherapy [facility])    has improved    Hyperlipidemia    Hypertension    under control, has been on med. x 3 yrs.   Melanoma in situ of back ([place]) 09/2011   left   Personal history of chemotherapy    Personal history of radiation therapy    Skin cancer 2013   Melanoma    Past Surgical History:  Procedure Laterality Date   BREAST LUMPECTOMY Right 12/21/2005   right   MASTECTOMY Left 1995   with breast reconstruction - left   RIGHT/LEFT HEART CATH AND CORONARY ANGIOGRAPHY N/A 04/20/2021   Procedure: RIGHT/LEFT HEART CATH AND CORONARY ANGIOGRAPHY;  Surgeon: Jettie Booze, MD;  Location: [place] [facility];  Service: Cardiovascular;  Laterality: N/A;    Current Medications: Current Meds  Medication Sig   albuterol (VENTOLIN HFA) 108 (90 Base) MCG/ACT inhaler Inhale 1-2 puffs into the lungs every 6 (six) hours as needed for shortness of breath or wheezing.   allopurinol (ZYLOPRIM) 300 MG tablet Take 1 tablet (300 mg total) by mouth daily.   aspirin EC 81 MG tablet Take 1 tablet (81 mg total) by mouth daily. Swallow whole.   atorvastatin (LIPITOR) 80 MG tablet Take 1 tablet (80 mg total) by mouth daily.   cholecalciferol (VITAMIN D) 1000 UNITS tablet Take 2,000 Units by mouth in the morning.   dapagliflozin propanediol (FARXIGA) 10 MG TABS tablet Take 1 tablet (  10 mg total) by mouth daily before breakfast.   furosemide (LASIX) 40 MG tablet Take 1 tablet (40 mg total) by mouth daily as needed.   hydroxyurea (HYDREA) 500 MG capsule Take 500 mg by mouth daily. In the evening. May take with food to minimize GI side effects.   Magnesium 400 MG TABS Take 1 tablet by mouth daily as needed. Only take on days taking furosemide   metFORMIN (GLUCOPHAGE) 500 MG tablet Take 0.5 tablets (250 mg total) by mouth in the morning and at bedtime.   metoprolol succinate (TOPROL XL) 25 MG 24 hr tablet Take 0.5 tablets (12.5 mg total) by mouth at bedtime. Please  hold this med if systolic BP is 90 or less.   potassium chloride SA (KLOR-CON M) 20 MEQ tablet Take 1 tablet (20 mEq total) by mouth daily as needed. Only on days taking furosemide   sacubitril-valsartan (ENTRESTO) 24-26 MG Take 1 tablet by mouth 2 (two) times daily.   triamcinolone (KENALOG) 0.025 % ointment Apply topically as needed.   [DISCONTINUED] spironolactone (ALDACTONE) 25 MG tablet Take 1 tablet (25 mg total) by mouth daily.     Allergies:   Patient has no known allergies.   Social History   Socioeconomic History   Marital status: Married    Spouse name: Not on file   Number of children: Not on file   Years of education: Not on file   Highest education level: Not on file  Occupational History   Not on file  Tobacco Use   Smoking status: Former    Types: Cigarettes    Quit date: 02/14/1964    Years since quitting: 57.4   Smokeless tobacco: Never  Vaping Use   Vaping Use: Never used  Substance and Sexual Activity   Alcohol use: Yes    Comment: daily glass of wine   Drug use: No   Sexual activity: Yes    Birth control/protection: Post-menopausal  Other Topics Concern   Not on file  Social History Narrative   Not on file   Social Determinants of Health   Financial Resource Strain: Not on file  Food Insecurity: Not on file  Transportation Needs: Not on file  Physical Activity: Not on file  Stress: Not on file  Social Connections: Not on file     Family History: The patient's family history includes Autism in her son; Breast cancer in her daughter; Cancer in her father; Lung cancer in her father.  ROS:   Please see the history of present illness.    Review of Systems  Constitutional:  Negative for chills, fever and malaise/fatigue.  HENT:  Negative for hearing loss.   Eyes:  Negative for blurred vision.  Respiratory:  Negative for shortness of breath.   Cardiovascular:  Negative for chest pain, palpitations, orthopnea, claudication, leg swelling and PND.   Gastrointestinal:  Negative for melena, nausea and vomiting.  Genitourinary:  Negative for hematuria.  Musculoskeletal:  Negative for falls.  Neurological:  Negative for dizziness and loss of consciousness.  Psychiatric/Behavioral:  Negative for substance abuse.      EKGs/Labs/Other Studies Reviewed:    The following studies were reviewed today: RHC/LHC 04/20/21:   There is moderate left ventricular systolic dysfunction.   LV end diastolic pressure is normal.   The left ventricular ejection fraction is 35-45% by visual estimate.   There is no aortic valve stenosis.   No angiographically apparent coronary artery disease.   Ao sat 100%, PA sat 71%, PA pressure  23/3, mean PA pressure 11 mm Hg; mean PCWP 3 mm Hg; CO 5.7 L/min; CI 3.88   Nonischemic cardiomyopathy.  Normal hemodynamics.  Continue medical therapy.   TTE 03/29/21: 1. Compared to 09/30/20 LV function is worse.   2. Left ventricular ejection fraction, by estimation, is 25 to 30%. The  left ventricle has severely decreased function. The left ventricle  demonstrates global hypokinesis. The left ventricular internal cavity size  was mildly dilated. Left ventricular  diastolic parameters are consistent with Grade II diastolic dysfunction  (pseudonormalization). Elevated left atrial pressure.   3. Right ventricular systolic function is mildly reduced. The right  ventricular size is normal.   4. Left atrial size was severely dilated.   5. A small pericardial effusion is present. Moderate pleural effusion in  the left lateral region.   6. The mitral valve is normal in structure. Mild mitral valve  regurgitation. No evidence of mitral stenosis.   7. The aortic valve is tricuspid. Aortic valve regurgitation is not  visualized. Aortic valve sclerosis is present, with no evidence of aortic  valve stenosis.   8. The inferior vena cava is normal in size with greater than 50%  respiratory variability, suggesting right atrial pressure  of 3 mmHg.   Comparison(s): Prior Ejection Fraction 45-50%.  TTE 11/13/18: IMPRESSIONS   1. Left ventricular ejection fraction, by visual estimation, is 45 to  50%. The left ventricle has mildly decreased function. Normal left  ventricular size. There is no left ventricular hypertrophy.   2. Elevated left ventricular end-diastolic pressure.   3. Left ventricular diastolic Doppler parameters are consistent with  impaired relaxation pattern of LV diastolic filling.   4. Global longitudinal strain abnormal (-11.5%). Very slightly worse than  10/2017.   5. Global right ventricle has normal systolic function.The right  ventricular size is normal. No increase in right ventricular wall  thickness.   6. Left atrial size was moderately dilated.   7. Right atrial size was normal.   8. The mitral valve is normal in structure. Trace mitral valve  regurgitation. No evidence of mitral stenosis.   9. The tricuspid valve is normal in structure. Tricuspid valve  regurgitation is mild.  10. The aortic valve is normal in structure. Aortic valve regurgitation  was not visualized by color flow Doppler. Structurally normal aortic  valve, with no evidence of sclerosis or stenosis.  11. The pulmonic valve was normal in structure. Pulmonic valve  regurgitation is trivial by color flow Doppler.  12. Mildly elevated pulmonary artery systolic pressure.  13. The inferior vena cava is normal in size with greater than 50%  respiratory variability, suggesting right atrial pressure of 3 mmHg.    EKG:  No new tracing today  Recent Labs: 03/29/2021: NT-Pro BNP 8,726 04/04/2021: Magnesium 2.0 07/25/2021: ALT 34; BUN 25; Creatinine 0.74; Hemoglobin 8.2; Platelet Count 140; Potassium 4.9; Sodium 130   Recent Lipid Panel    Component Value Date/Time   CHOL 126 01/28/2020 1315   TRIG 190 (H) 01/28/2020 1315   HDL 29 (L) 01/28/2020 1315   CHOLHDL 4.3 01/28/2020 1315   LDLCALC 65 01/28/2020 1315     Risk  Assessment/Calculations:           Physical Exam:    VS:  BP 94/60   Pulse 98   Ht '5\' 2"'$  (1.575 m)   Wt 105 lb 6.4 oz (47.8 kg)   SpO2 99%   BMI 19.28 kg/m     Wt Readings from Last  3 Encounters:  07/28/21 105 lb 6.4 oz (47.8 kg)  07/12/21 107 lb 9.6 oz (48.8 kg)  06/13/21 108 lb 12.8 oz (49.4 kg)     GEN:  Comfortable, sitting comfortably in a chair HEENT: Normal NECK: No JVD CARDIAC: RRR, 2/6 systolic murmur. No rubs, gallops RESPIRATORY:  CTAB ABDOMEN: Soft, non-tender, non-distended MUSCULOSKELETAL:  No LE edema, warm SKIN: Warm and dry NEUROLOGIC:  Alert and oriented x 3 PSYCHIATRIC:  Normal affect   ASSESSMENT:    1. Chronic systolic heart failure ([place])   2. Medication management   3. Nonischemic cardiomyopathy ([place])   4. Acute on chronic systolic heart failure ([place])   5. Benign essential HTN   6. Malignant neoplasm of female breast, unspecified estrogen receptor status, unspecified laterality, unspecified site of breast ([place])   7. Thrombocytosis   8. Essential hypertension   9. NICM (nonischemic cardiomyopathy) ([place])     PLAN:    In order of problems listed above:  #Acute on Chronic Systolic Heart Failure  #Nonischemic CM: #History of chemotherapy induced CM: Patient with history of chemo induced CM with improved EF to 45-50% on multiple subsequent TTEs. Was doing well until about 01/2021 when she developed progressive fatigue, LE edema and abdominal distension. Saw her oncologist who ordered TTE on 03/29/21 which showed EF 25% global hypokinesis, mild-to-moderate MR, severe LAE, RV mildly enlarged, elevated filling pressures, RAP 30mHg, small pericardial effusion. She preferred trial of outpatient management. We started lasix with significant improvement in volume status.RHC with normal filling pressures. Cath with no obstructive disease. Will continue GDMT as able.  -Cath with no obstructive CAD -Continue lasix '40mg'$  prn for weight gain -Change coreg to  metop 12.'5mg'$  XL daily due to hypotension -Continue entresto 24/'26mg'$  BID -Stop spiro due to hypotension  -Start farxiga '10mg'$  daily -Low Na diet  #HTN: Now with low blood pressures in 80-90s at home. Relatively asymptomatic. -Change coreg to metop 12.'5mg'$  XL daily for less BP lowering -Continue entresto 24/'26mg'$  BID -Stop spiro due to hypotension   #HLD: LDL 65 01/2020. -Continue lipitor '80mg'$  daily  #History of Breast Cancer in remission History of chemo with anthracycline and XRT. Had depressed LVEF but recovered to 45-50%, now back down to 25-30% as detailed above. -Management acute on chronic HF as above  #Leukocytosis: #Thrombocytosis: -On hydroxyurea  Follow-up:   3 months.  Medication Adjustments/Labs and Tests Ordered: Current medicines are reviewed at length with the patient today.  Concerns regarding medicines are outlined above.   Orders Placed This Encounter  Procedures   ECHOCARDIOGRAM COMPLETE   Meds ordered this encounter  Medications   metoprolol succinate (TOPROL XL) 25 MG 24 hr tablet    Sig: Take 0.5 tablets (12.5 mg total) by mouth at bedtime. Please hold this med if systolic BP is 90 or less.    Dispense:  45 tablet    Refill:  2    Pt to monitor blood pressure prior to taking this med.   dapagliflozin propanediol (FARXIGA) 10 MG TABS tablet    Sig: Take 1 tablet (10 mg total) by mouth daily before breakfast.    Dispense:  30 tablet    Refill:  3   Patient Instructions  Medication Instructions:   STOP TAKING CARVEDILOL (COREG) NOW  STOP TAKING SPIRONOLACTONE NOW  START TAKING METOPROLOL SUCCINATE (TOPROL XL) 12.5 MG BY MOUTH DAILY AT BEDTIME--PLEASE MONITOR YOUR BP PRIOR TO TAKING THIS MEDICATION AND HOLD IF SYSTOLIC (TOP NUMBER) BP IS 90 OR LESS  START TAKING  FARXIGA 10 MG BY MOUTH DAILY BEFORE BREAKFAST  *If you need a refill on your cardiac medications before your next appointment, please call your pharmacy*    Testing/Procedures:  Your  physician has requested that you have an echocardiogram. Echocardiography is a painless test that uses sound waves to create images of your heart. It provides your doctor with information about the size and shape of your heart and how well your heart's chambers and valves are working. This procedure takes approximately one hour. There are no restrictions for this procedure.  PLEASE SCHEDULE ECHO FOR SEPTEMBER 2023 AND FOLLOW-UP VISIT WITH DR. Johney Frame THEREAFTER IN [place]    Follow-Up:  IN SEPTEMBER 2023 WITH DR. Isaiah Blakes SCHEDULE HER ECHO IN SEPTEMBER A FEW DAYS PRIOR TO THIS VISIT   Important Information About Sugar       Signed, Freada Bergeron, MD  07/28/2021 10:03 AM    [place]

## 2021-07-25 ENCOUNTER — Other Ambulatory Visit: Payer: Self-pay | Admitting: *Deleted

## 2021-07-25 ENCOUNTER — Other Ambulatory Visit: Payer: Self-pay

## 2021-07-25 ENCOUNTER — Inpatient Hospital Stay: Payer: Medicare Other

## 2021-07-25 ENCOUNTER — Inpatient Hospital Stay: Payer: Medicare Other | Attending: Oncology

## 2021-07-25 ENCOUNTER — Telehealth: Payer: Self-pay | Admitting: *Deleted

## 2021-07-25 VITALS — BP 79/51 | HR 100 | Temp 98.1°F | Resp 16

## 2021-07-25 DIAGNOSIS — D469 Myelodysplastic syndrome, unspecified: Secondary | ICD-10-CM

## 2021-07-25 DIAGNOSIS — D46Z Other myelodysplastic syndromes: Secondary | ICD-10-CM | POA: Insufficient documentation

## 2021-07-25 DIAGNOSIS — D649 Anemia, unspecified: Secondary | ICD-10-CM

## 2021-07-25 DIAGNOSIS — D471 Chronic myeloproliferative disease: Secondary | ICD-10-CM

## 2021-07-25 DIAGNOSIS — D619 Aplastic anemia, unspecified: Secondary | ICD-10-CM

## 2021-07-25 DIAGNOSIS — M7502 Adhesive capsulitis of left shoulder: Secondary | ICD-10-CM | POA: Diagnosis not present

## 2021-07-25 LAB — CBC WITH DIFFERENTIAL (CANCER CENTER ONLY)
Abs Immature Granulocytes: 13.37 10*3/uL — ABNORMAL HIGH (ref 0.00–0.07)
Basophils Absolute: 0.4 10*3/uL — ABNORMAL HIGH (ref 0.0–0.1)
Basophils Relative: 0 %
Eosinophils Absolute: 0 10*3/uL (ref 0.0–0.5)
Eosinophils Relative: 0 %
HCT: 23.8 % — ABNORMAL LOW (ref 36.0–46.0)
Hemoglobin: 8.2 g/dL — ABNORMAL LOW (ref 12.0–15.0)
Immature Granulocytes: 15 %
Lymphocytes Relative: 4 %
Lymphs Abs: 4 10*3/uL (ref 0.7–4.0)
MCH: 33.5 pg (ref 26.0–34.0)
MCHC: 34.5 g/dL (ref 30.0–36.0)
MCV: 97.1 fL (ref 80.0–100.0)
Monocytes Absolute: 0.5 10*3/uL (ref 0.1–1.0)
Monocytes Relative: 1 %
Neutro Abs: 72.6 10*3/uL — ABNORMAL HIGH (ref 1.7–7.7)
Neutrophils Relative %: 80 %
Platelet Count: 140 10*3/uL — ABNORMAL LOW (ref 150–400)
RBC: 2.45 MIL/uL — ABNORMAL LOW (ref 3.87–5.11)
RDW: 25.7 % — ABNORMAL HIGH (ref 11.5–15.5)
Smear Review: NORMAL
WBC Count: 90.8 10*3/uL (ref 4.0–10.5)
nRBC: 0 % (ref 0.0–0.2)

## 2021-07-25 LAB — CMP (CANCER CENTER ONLY)
ALT: 34 U/L (ref 0–44)
AST: 22 U/L (ref 15–41)
Albumin: 3.5 g/dL (ref 3.5–5.0)
Alkaline Phosphatase: 210 U/L — ABNORMAL HIGH (ref 38–126)
Anion gap: 8 (ref 5–15)
BUN: 25 mg/dL — ABNORMAL HIGH (ref 8–23)
CO2: 23 mmol/L (ref 22–32)
Calcium: 9.7 mg/dL (ref 8.9–10.3)
Chloride: 99 mmol/L (ref 98–111)
Creatinine: 0.74 mg/dL (ref 0.44–1.00)
GFR, Estimated: >60 mL/min (ref >60)
Glucose, Bld: 182 mg/dL — ABNORMAL HIGH (ref 70–99)
Potassium: 4.9 mmol/L (ref 3.5–5.1)
Sodium: 130 mmol/L — ABNORMAL LOW (ref 135–145)
Total Bilirubin: 0.5 mg/dL (ref 0.3–1.2)
Total Protein: 7.2 g/dL (ref 6.5–8.1)

## 2021-07-25 LAB — SAMPLE TO BLOOD BANK

## 2021-07-25 LAB — PREPARE RBC (CROSSMATCH)

## 2021-07-25 MED ORDER — DIPHENHYDRAMINE HCL 25 MG PO CAPS: 25.0000 mg | ORAL_CAPSULE | Freq: Once | ORAL | Status: DC

## 2021-07-25 MED ORDER — ACETAMINOPHEN 325 MG PO TABS: 650.0000 mg | ORAL_TABLET | Freq: Once | ORAL | Status: DC

## 2021-07-25 MED ORDER — SODIUM CHLORIDE 0.9% IV SOLUTION
250.0000 mL | Freq: Once | INTRAVENOUS | Status: AC
  Administered 2021-07-25: 250 mL via INTRAVENOUS

## 2021-07-25 MED ORDER — DARBEPOETIN ALFA 500 MCG/ML IJ SOSY
500.0000 ug | PREFILLED_SYRINGE | Freq: Once | INTRAMUSCULAR | Status: AC
  Administered 2021-07-25: 500 ug via SUBCUTANEOUS
  Filled 2021-07-25: qty 1

## 2021-07-25 NOTE — Telephone Encounter (Signed)
CRITICAL VALUE STICKER  CRITICAL VALUE: WBC 90.8  RECEIVER (on-site recipient of call): Georgina Pillion, [place] NOTIFIED: 07/25/21; [phone]  MESSENGER (representative from lab):Anette Guarneri  MD NOTIFIED: Clovis Riley, PA (in Dr. Rob Hickman absence)  TIME OF NOTIFICATION:0900  RESPONSE: Information acknowledged

## 2021-07-25 NOTE — Progress Notes (Signed)
Pt hypotensive on arrival and during blood transfusion (see flowsheet for BP).  Pt has no c/o lightheadedness or dizziness.  Reviewed with Dr Lindi Adie.  Pt has cardiology appt this Thursday.  Pt instructed to hold diuretics and coreg until MD visit on Thursday.  Pt verbalized understanding.

## 2021-07-25 NOTE — Patient Instructions (Signed)
Blood Transfusion, Adult A blood transfusion is a procedure in which you receive blood through an IV tube. You may need this procedure because of: A bleeding disorder. An illness. An injury. A surgery. The blood may come from someone else (a donor). You may also be able to donate blood for yourself. The blood given in a transfusion is made up of different types of cells. You may get: Red blood cells. These carry oxygen to the cells in the body. White blood cells. These help you fight infections. Platelets. These help your blood to clot. Plasma. This is the liquid part of your blood. It carries proteins and other substances through the body. If you have a clotting disorder, you may also get other types of blood products. Tell your doctor about: Any blood disorders you have. Any reactions you have had during a blood transfusion in the past. Any allergies you have. All medicines you are taking, including vitamins, herbs, eye drops, creams, and over-the-counter medicines. Any surgeries you have had. Any medical conditions you have. This includes any recent fever or cold symptoms. Whether you are pregnant or may be pregnant. What are the risks? Generally, this is a safe procedure. However, problems may occur. The most common problems include: A mild allergic reaction. This includes red, swollen areas of skin (hives) and itching. Fever or chills. This may be the body's response to new blood cells received. This may happen during or up to 4 hours after the transfusion. More serious problems may include: Too much fluid in the lungs. This may cause breathing problems. A serious allergic reaction. This includes breathing trouble or swelling around the face and lips. Lung injury. This causes breathing trouble and low oxygen in the blood. This can happen within hours of the transfusion or days later. Too much iron. This can happen after getting many blood transfusions over a period of time. An  infection or virus passed through the blood. This is rare. Donated blood is carefully tested before it is given. Your body's defense system (immune system) trying to attack the new blood cells. This is rare. Symptoms may include fever, chills, nausea, low blood pressure, and low back or chest pain. Donated cells attacking healthy tissues. This is rare. What happens before the procedure? Medicines Ask your doctor about: Changing or stopping your normal medicines. This is important. Taking aspirin and ibuprofen. Do not take these medicines unless your doctor tells you to take them. Taking over-the-counter medicines, vitamins, herbs, and supplements. General instructions Follow instructions from your doctor about what you cannot eat or drink. You will have a blood test to find out your blood type. The test also finds out what type of blood your body will accept and matches it to the donor type. If you are going to have a planned surgery, you may be able to donate your own blood. This may be done in case you need a transfusion. You will have your temperature, blood pressure, and pulse checked. You may receive medicine to help prevent an allergic reaction. This may be done if you have had a reaction to a transfusion before. This medicine may be given to you by mouth or through an IV tube. This procedure lasts about 1-4 hours. Plan for the time you need. What happens during the procedure?  An IV tube will be put into one of your veins. The bag of donated blood will be attached to your IV tube. Then, the blood will enter through your vein. Your temperature,   blood pressure, and pulse will be checked often. This is done to find early signs of a transfusion reaction. Tell your nurse right away if you have any of these symptoms: Shortness of breath or trouble breathing. Chest or back pain. Fever or chills. Red, swollen areas of skin or itching. If you have any signs or symptoms of a reaction, your  transfusion will be stopped. You may also be given medicine. When the transfusion is finished, your IV tube will be taken out. Pressure may be put on the IV site for a few minutes. A bandage (dressing) will be put on the IV site. The procedure may vary among doctors and hospitals. What happens after the procedure? You will be monitored until you leave the hospital or clinic. This includes checking your temperature, blood pressure, pulse, breathing rate, and blood oxygen level. Your blood may be tested to see how you are responding to the transfusion. You may be warmed with fluids or blankets. This is done to keep the temperature of your body normal. If you have your procedure in an outpatient setting, you will be told whom to contact to report any reactions. Where to find more information To learn more, visit the [employer]: redcross.org Summary A blood transfusion is a procedure in which you are given blood through an IV tube. The blood may come from someone else (a donor). You may also be able to donate blood for yourself. The blood you are given is made up of different blood cells. You may receive red blood cells, platelets, plasma, or white blood cells. Your temperature, blood pressure, and pulse will be checked often. After the procedure, your blood may be tested to see how you are responding. This information is not intended to replace advice given to you by your health care provider. Make sure you discuss any questions you have with your health care provider. Document Revised: 07/25/2018 Document Reviewed: 07/25/2018 Elsevier Patient Education  2023 Elsevier Inc.  

## 2021-07-25 NOTE — Progress Notes (Signed)
Verbal order from Dr.Gudena transfuse 1 unit PRBCs. Orders entered. Confirmed with [facility]. BB will call [facility] infusion room when blood ready

## 2021-07-26 LAB — BPAM RBC
Blood Product Expiration Date: 202307062359
ISSUE DATE / TIME: 202306121123
Unit Type and Rh: 600

## 2021-07-26 LAB — TYPE AND SCREEN
ABO/RH(D): AB NEG
Antibody Screen: NEGATIVE
Unit division: 0

## 2021-07-27 ENCOUNTER — Telehealth: Payer: Self-pay | Admitting: Hematology and Oncology

## 2021-07-27 ENCOUNTER — Telehealth: Payer: Self-pay

## 2021-07-27 NOTE — Telephone Encounter (Signed)
.  Called patient to schedule appointment per 6/14inbasket, patient is aware of date and time.

## 2021-07-27 NOTE — Progress Notes (Signed)
Patient requesting aranesp early on 6/20 (8 days post previous dose) due to out of the country traveling. If labs within appropriate range would be recommend giving injection to patient

## 2021-07-27 NOTE — Telephone Encounter (Signed)
Patient called regarding recent myChart message. Confirmed new appointment times for patient. Patient will be out of the country on 6/26 when her previous appointments were scheduled. Patient rescheduled for labs and infusion on 6/20. Confirmed with pharmacy staff, Teldrin Rph, that patient should be okay to receive Aranesp injection early.  Patient aware of new appointment dates and times and appreciative of call.  All questions answered during call.

## 2021-07-28 ENCOUNTER — Ambulatory Visit: Payer: Medicare Other | Admitting: Cardiology

## 2021-07-28 ENCOUNTER — Encounter: Payer: Self-pay | Admitting: Cardiology

## 2021-07-28 VITALS — BP 94/60 | HR 98 | Ht 62.0 in | Wt 105.4 lb

## 2021-07-28 DIAGNOSIS — C50919 Malignant neoplasm of unspecified site of unspecified female breast: Secondary | ICD-10-CM

## 2021-07-28 DIAGNOSIS — I428 Other cardiomyopathies: Secondary | ICD-10-CM

## 2021-07-28 DIAGNOSIS — Z79899 Other long term (current) drug therapy: Secondary | ICD-10-CM

## 2021-07-28 DIAGNOSIS — I5022 Chronic systolic (congestive) heart failure: Secondary | ICD-10-CM

## 2021-07-28 DIAGNOSIS — I5023 Acute on chronic systolic (congestive) heart failure: Secondary | ICD-10-CM

## 2021-07-28 DIAGNOSIS — D75839 Thrombocytosis, unspecified: Secondary | ICD-10-CM

## 2021-07-28 DIAGNOSIS — I10 Essential (primary) hypertension: Secondary | ICD-10-CM

## 2021-07-28 MED ORDER — DAPAGLIFLOZIN PROPANEDIOL 10 MG PO TABS: 10.0000 mg | ORAL_TABLET | Freq: Every day | ORAL | 3 refills | Status: DC

## 2021-07-28 MED ORDER — METOPROLOL SUCCINATE ER 25 MG PO TB24: 12.5000 mg | ORAL_TABLET | Freq: Every day | ORAL | 2 refills | Status: AC

## 2021-07-28 NOTE — Patient Instructions (Signed)
Medication Instructions:   STOP TAKING CARVEDILOL (COREG) NOW  STOP TAKING SPIRONOLACTONE NOW  START TAKING METOPROLOL SUCCINATE (TOPROL XL) 12.5 MG BY MOUTH DAILY AT BEDTIME--PLEASE MONITOR YOUR BP PRIOR TO TAKING THIS MEDICATION AND HOLD IF SYSTOLIC (TOP NUMBER) BP IS 90 OR LESS  START TAKING FARXIGA 10 MG BY MOUTH DAILY BEFORE BREAKFAST  *If you need a refill on your cardiac medications before your next appointment, please call your pharmacy*    Testing/Procedures:  Your physician has requested that you have an echocardiogram. Echocardiography is a painless test that uses sound waves to create images of your heart. It provides your doctor with information about the size and shape of your heart and how well your heart's chambers and valves are working. This procedure takes approximately one hour. There are no restrictions for this procedure.  PLEASE SCHEDULE ECHO FOR SEPTEMBER 2023 AND FOLLOW-UP VISIT WITH DR. Johney Frame THEREAFTER IN [place]    Follow-Up:  IN SEPTEMBER 2023 WITH DR. Isaiah Blakes SCHEDULE HER ECHO IN SEPTEMBER A FEW DAYS PRIOR TO THIS VISIT   Important Information About Sugar

## 2021-08-02 ENCOUNTER — Other Ambulatory Visit: Payer: Self-pay

## 2021-08-02 ENCOUNTER — Inpatient Hospital Stay: Payer: Medicare Other

## 2021-08-02 VITALS — BP 98/62 | HR 89 | Temp 98.2°F | Resp 17

## 2021-08-02 DIAGNOSIS — D46Z Other myelodysplastic syndromes: Secondary | ICD-10-CM | POA: Diagnosis not present

## 2021-08-02 DIAGNOSIS — D471 Chronic myeloproliferative disease: Secondary | ICD-10-CM

## 2021-08-02 DIAGNOSIS — D469 Myelodysplastic syndrome, unspecified: Secondary | ICD-10-CM

## 2021-08-02 LAB — SAMPLE TO BLOOD BANK

## 2021-08-02 LAB — CBC WITH DIFFERENTIAL (CANCER CENTER ONLY)
Abs Immature Granulocytes: 25.52 10*3/uL — ABNORMAL HIGH (ref 0.00–0.07)
Basophils Absolute: 0.1 10*3/uL (ref 0.0–0.1)
Basophils Relative: 0 %
Eosinophils Absolute: 0 10*3/uL (ref 0.0–0.5)
Eosinophils Relative: 0 %
HCT: 24.2 % — ABNORMAL LOW (ref 36.0–46.0)
Hemoglobin: 8.3 g/dL — ABNORMAL LOW (ref 12.0–15.0)
Immature Granulocytes: 23 %
Lymphocytes Relative: 4 %
Lymphs Abs: 4 10*3/uL (ref 0.7–4.0)
MCH: 33.6 pg (ref 26.0–34.0)
MCHC: 34.3 g/dL (ref 30.0–36.0)
MCV: 98 fL (ref 80.0–100.0)
Monocytes Absolute: 1.9 10*3/uL — ABNORMAL HIGH (ref 0.1–1.0)
Monocytes Relative: 2 %
Neutro Abs: 81.5 10*3/uL — ABNORMAL HIGH (ref 1.7–7.7)
Neutrophils Relative %: 71 %
Platelet Count: 121 10*3/uL — ABNORMAL LOW (ref 150–400)
RBC: 2.47 MIL/uL — ABNORMAL LOW (ref 3.87–5.11)
RDW: 24.4 % — ABNORMAL HIGH (ref 11.5–15.5)
Smear Review: NORMAL
WBC Count: 113 10*3/uL (ref 4.0–10.5)
nRBC: 0 % (ref 0.0–0.2)

## 2021-08-02 LAB — CMP (CANCER CENTER ONLY)
ALT: 38 U/L (ref 0–44)
AST: 26 U/L (ref 15–41)
Albumin: 3.5 g/dL (ref 3.5–5.0)
Alkaline Phosphatase: 148 U/L — ABNORMAL HIGH (ref 38–126)
Anion gap: 8 (ref 5–15)
BUN: 29 mg/dL — ABNORMAL HIGH (ref 8–23)
CO2: 25 mmol/L (ref 22–32)
Calcium: 9.3 mg/dL (ref 8.9–10.3)
Chloride: 102 mmol/L (ref 98–111)
Creatinine: 0.67 mg/dL (ref 0.44–1.00)
GFR, Estimated: >60 mL/min (ref >60)
Glucose, Bld: 142 mg/dL — ABNORMAL HIGH (ref 70–99)
Potassium: 4.2 mmol/L (ref 3.5–5.1)
Sodium: 135 mmol/L (ref 135–145)
Total Bilirubin: 0.4 mg/dL (ref 0.3–1.2)
Total Protein: 6.9 g/dL (ref 6.5–8.1)

## 2021-08-02 MED ORDER — DARBEPOETIN ALFA 500 MCG/ML IJ SOSY
500.0000 ug | PREFILLED_SYRINGE | Freq: Once | INTRAMUSCULAR | Status: AC
  Administered 2021-08-02: 500 ug via SUBCUTANEOUS
  Filled 2021-08-02: qty 1

## 2021-08-02 NOTE — Patient Instructions (Signed)
Darbepoetin Alfa injection ?What is this medication? ?DARBEPOETIN ALFA (dar be POE e tin  AL fa) helps your body make more red blood cells. It is used to treat anemia caused by chronic kidney failure and chemotherapy. ?This medicine may be used for other purposes; ask your health care provider or pharmacist if you have questions. ?COMMON BRAND NAME(S): Aranesp ?What should I tell my care team before I take this medication? ?They need to know if you have any of these conditions: ?blood clotting disorders or history of blood clots ?cancer patient not on chemotherapy ?cystic fibrosis ?heart disease, such as angina, heart failure, or a history of a heart attack ?hemoglobin level of 12 g/dL or greater ?high blood pressure ?low levels of folate, iron, or vitamin B12 ?seizures ?an unusual or allergic reaction to darbepoetin, erythropoietin, albumin, hamster proteins, latex, other medicines, foods, dyes, or preservatives ?pregnant or trying to get pregnant ?breast-feeding ?How should I use this medication? ?This medicine is for injection into a vein or under the skin. It is usually given by a health care professional in a hospital or clinic setting. ?If you get this medicine at home, you will be taught how to prepare and give this medicine. Use exactly as directed. Take your medicine at regular intervals. Do not take your medicine more often than directed. ?It is important that you put your used needles and syringes in a special sharps container. Do not put them in a trash can. If you do not have a sharps container, call your pharmacist or healthcare provider to get one. ?A special MedGuide will be given to you by the pharmacist with each prescription and refill. Be sure to read this information carefully each time. ?Talk to your pediatrician regarding the use of this medicine in children. While this medicine may be used in children as young as 1 month of age for selected conditions, precautions do apply. ?Overdosage: If  you think you have taken too much of this medicine contact a poison control center or emergency room at once. ?NOTE: This medicine is only for you. Do not share this medicine with others. ?What if I miss a dose? ?If you miss a dose, take it as soon as you can. If it is almost time for your next dose, take only that dose. Do not take double or extra doses. ?What may interact with this medication? ?Do not take this medicine with any of the following medications: ?epoetin alfa ?This list may not describe all possible interactions. Give your health care provider a list of all the medicines, herbs, non-prescription drugs, or dietary supplements you use. Also tell them if you smoke, drink alcohol, or use illegal drugs. Some items may interact with your medicine. ?What should I watch for while using this medication? ?Your condition will be monitored carefully while you are receiving this medicine. ?You may need blood work done while you are taking this medicine. ?This medicine may cause a decrease in vitamin B6. You should make sure that you get enough vitamin B6 while you are taking this medicine. Discuss the foods you eat and the vitamins you take with your health care professional. ?What side effects may I notice from receiving this medication? ?Side effects that you should report to your doctor or health care professional as soon as possible: ?allergic reactions like skin rash, itching or hives, swelling of the face, lips, or tongue ?breathing problems ?changes in vision ?chest pain ?confusion, trouble speaking or understanding ?feeling faint or lightheaded, falls ?high blood   pressure ?muscle aches or pains ?pain, swelling, warmth in the leg ?rapid weight gain ?severe headaches ?sudden numbness or weakness of the face, arm or leg ?trouble walking, dizziness, loss of balance or coordination ?seizures (convulsions) ?swelling of the ankles, feet, hands ?unusually weak or tired ?Side effects that usually do not require  medical attention (report to your doctor or health care professional if they continue or are bothersome): ?diarrhea ?fever, chills (flu-like symptoms) ?headaches ?nausea, vomiting ?redness, stinging, or swelling at site where injected ?This list may not describe all possible side effects. Call your doctor for medical advice about side effects. You may report side effects to FDA at 1-800-FDA-1088. ?Where should I keep my medication? ?Keep out of the reach of children and pets. ?Store in a refrigerator. Do not freeze. Do not shake. Throw away any unused portion if using a single-dose vial. Multi-dose vials can be kept in the refrigerator for up to 21 days after the initial dose. Throw away unused medicine. ?To get rid of medications that are no longer needed or have expired: ?Take the medication to a medication take-back program. Check with your pharmacy or law enforcement to find a location. ?If you cannot return the medication, ask your pharmacist or care team how to get rid of the medication safely. ?NOTE: This sheet is a summary. It may not cover all possible information. If you have questions about this medicine, talk to your doctor, pharmacist, or health care provider. ?? 2023 Elsevier/Gold Standard (2021-01-31 00:00:00) ? ?

## 2021-08-02 NOTE — Progress Notes (Signed)
CRITICAL VALUE STICKER  CRITICAL VALUE: WBC 113   RECEIVER (on-site recipient of call):Gage Treiber RN  DATE & TIME NOTIFIED: 08/02/21 8:10  MESSENGER (representative from lab): [place]   MD NOTIFIED: Dr Chryl Heck   TIME OF NOTIFICATION:8:15  RESPONSE:  Dr. Ree Kida and aware of her WBC levels

## 2021-08-08 ENCOUNTER — Inpatient Hospital Stay: Payer: Medicare Other

## 2021-08-08 ENCOUNTER — Inpatient Hospital Stay: Payer: Medicare Other | Admitting: Hematology and Oncology

## 2021-08-18 DIAGNOSIS — K130 Diseases of lips: Secondary | ICD-10-CM | POA: Diagnosis not present

## 2021-08-18 DIAGNOSIS — E119 Type 2 diabetes mellitus without complications: Secondary | ICD-10-CM | POA: Diagnosis not present

## 2021-08-18 DIAGNOSIS — Z923 Personal history of irradiation: Secondary | ICD-10-CM | POA: Diagnosis not present

## 2021-08-18 DIAGNOSIS — Z9013 Acquired absence of bilateral breasts and nipples: Secondary | ICD-10-CM | POA: Diagnosis not present

## 2021-08-18 DIAGNOSIS — D471 Chronic myeloproliferative disease: Secondary | ICD-10-CM | POA: Diagnosis not present

## 2021-08-18 DIAGNOSIS — Z9221 Personal history of antineoplastic chemotherapy: Secondary | ICD-10-CM | POA: Diagnosis not present

## 2021-08-18 DIAGNOSIS — R220 Localized swelling, mass and lump, head: Secondary | ICD-10-CM | POA: Diagnosis not present

## 2021-08-18 DIAGNOSIS — I10 Essential (primary) hypertension: Secondary | ICD-10-CM | POA: Diagnosis not present

## 2021-08-18 DIAGNOSIS — E785 Hyperlipidemia, unspecified: Secondary | ICD-10-CM | POA: Diagnosis not present

## 2021-08-19 DIAGNOSIS — M25512 Pain in left shoulder: Secondary | ICD-10-CM | POA: Diagnosis not present

## 2021-08-19 DIAGNOSIS — M79642 Pain in left hand: Secondary | ICD-10-CM | POA: Diagnosis not present

## 2021-08-22 ENCOUNTER — Inpatient Hospital Stay: Payer: Medicare Other

## 2021-08-22 ENCOUNTER — Telehealth: Payer: Self-pay

## 2021-08-22 ENCOUNTER — Telehealth: Payer: Self-pay | Admitting: Hematology and Oncology

## 2021-08-22 ENCOUNTER — Other Ambulatory Visit: Payer: Self-pay

## 2021-08-22 DIAGNOSIS — C4A0 Merkel cell carcinoma of lip: Secondary | ICD-10-CM | POA: Diagnosis not present

## 2021-08-22 DIAGNOSIS — C4409 Other specified malignant neoplasm of skin of lip: Secondary | ICD-10-CM | POA: Diagnosis not present

## 2021-08-22 DIAGNOSIS — D469 Myelodysplastic syndrome, unspecified: Secondary | ICD-10-CM

## 2021-08-22 NOTE — Telephone Encounter (Signed)
Pt called to make appt for aranesp injection after having labs and blood transfusion at [facility]. Pt needs aranesp q14 days per order and needs MD f/u scheduled as she cancelled her last in June but did not r/s. Pt has been scheduled for labs and inj for 7/11. Message sent to scheduling regarding future lab and inj appts as well as MD f/u in 1 mo. Pt understands and knows to call with any further questions.

## 2021-08-22 NOTE — Telephone Encounter (Signed)
Contacted patient to scheduled appointments.. Patient advised she will come in tomorrow to scheduling to schedule in person.

## 2021-08-23 ENCOUNTER — Inpatient Hospital Stay: Payer: Medicare Other

## 2021-08-23 ENCOUNTER — Other Ambulatory Visit: Payer: Self-pay

## 2021-08-23 ENCOUNTER — Inpatient Hospital Stay: Payer: Medicare Other | Attending: Oncology

## 2021-08-23 VITALS — BP 120/56 | HR 89 | Temp 97.8°F | Resp 17

## 2021-08-23 DIAGNOSIS — D469 Myelodysplastic syndrome, unspecified: Secondary | ICD-10-CM

## 2021-08-23 DIAGNOSIS — D471 Chronic myeloproliferative disease: Secondary | ICD-10-CM

## 2021-08-23 DIAGNOSIS — D46Z Other myelodysplastic syndromes: Secondary | ICD-10-CM | POA: Diagnosis not present

## 2021-08-23 LAB — CMP (CANCER CENTER ONLY)
ALT: 26 U/L (ref 0–44)
AST: 20 U/L (ref 15–41)
Albumin: 3.5 g/dL (ref 3.5–5.0)
Alkaline Phosphatase: 172 U/L — ABNORMAL HIGH (ref 38–126)
Anion gap: 6 (ref 5–15)
BUN: 17 mg/dL (ref 8–23)
CO2: 26 mmol/L (ref 22–32)
Calcium: 9.3 mg/dL (ref 8.9–10.3)
Chloride: 105 mmol/L (ref 98–111)
Creatinine: 0.55 mg/dL (ref 0.44–1.00)
GFR, Estimated: >60 mL/min (ref >60)
Glucose, Bld: 128 mg/dL — ABNORMAL HIGH (ref 70–99)
Potassium: 4 mmol/L (ref 3.5–5.1)
Sodium: 137 mmol/L (ref 135–145)
Total Bilirubin: 0.7 mg/dL (ref 0.3–1.2)
Total Protein: 7.1 g/dL (ref 6.5–8.1)

## 2021-08-23 LAB — CBC WITH DIFFERENTIAL (CANCER CENTER ONLY)
Abs Immature Granulocytes: 13.08 10*3/uL — ABNORMAL HIGH (ref 0.00–0.07)
Basophils Absolute: 0.1 10*3/uL (ref 0.0–0.1)
Basophils Relative: 0 %
Eosinophils Absolute: 0 10*3/uL (ref 0.0–0.5)
Eosinophils Relative: 0 %
HCT: 28.3 % — ABNORMAL LOW (ref 36.0–46.0)
Hemoglobin: 9.4 g/dL — ABNORMAL LOW (ref 12.0–15.0)
Immature Granulocytes: 17 %
Lymphocytes Relative: 4 %
Lymphs Abs: 2.9 10*3/uL (ref 0.7–4.0)
MCH: 32.5 pg (ref 26.0–34.0)
MCHC: 33.2 g/dL (ref 30.0–36.0)
MCV: 97.9 fL (ref 80.0–100.0)
Monocytes Absolute: 1.1 10*3/uL — ABNORMAL HIGH (ref 0.1–1.0)
Monocytes Relative: 1 %
Neutro Abs: 60.8 10*3/uL — ABNORMAL HIGH (ref 1.7–7.7)
Neutrophils Relative %: 78 %
Platelet Count: 126 10*3/uL — ABNORMAL LOW (ref 150–400)
RBC: 2.89 MIL/uL — ABNORMAL LOW (ref 3.87–5.11)
RDW: 24.2 % — ABNORMAL HIGH (ref 11.5–15.5)
Smear Review: NORMAL
WBC Count: 78 10*3/uL (ref 4.0–10.5)
nRBC: 0 % (ref 0.0–0.2)

## 2021-08-23 LAB — SAMPLE TO BLOOD BANK

## 2021-08-23 MED ORDER — DARBEPOETIN ALFA 500 MCG/ML IJ SOSY
500.0000 ug | PREFILLED_SYRINGE | Freq: Once | INTRAMUSCULAR | Status: AC
  Administered 2021-08-23: 500 ug via SUBCUTANEOUS
  Filled 2021-08-23: qty 1

## 2021-08-29 ENCOUNTER — Telehealth: Payer: Self-pay | Admitting: Hematology and Oncology

## 2021-08-29 NOTE — Telephone Encounter (Signed)
Called patient about scheduling injection appointments. Left message for her to call back to schedule appointments.

## 2021-09-01 DIAGNOSIS — D471 Chronic myeloproliferative disease: Secondary | ICD-10-CM | POA: Diagnosis not present

## 2021-09-01 DIAGNOSIS — C4A9 Merkel cell carcinoma, unspecified: Secondary | ICD-10-CM | POA: Diagnosis not present

## 2021-09-01 DIAGNOSIS — E790 Hyperuricemia without signs of inflammatory arthritis and tophaceous disease: Secondary | ICD-10-CM | POA: Diagnosis not present

## 2021-09-05 ENCOUNTER — Other Ambulatory Visit: Payer: Self-pay

## 2021-09-05 ENCOUNTER — Inpatient Hospital Stay: Payer: Medicare Other

## 2021-09-05 VITALS — BP 115/56 | HR 93 | Resp 18

## 2021-09-05 DIAGNOSIS — D469 Myelodysplastic syndrome, unspecified: Secondary | ICD-10-CM

## 2021-09-05 DIAGNOSIS — D471 Chronic myeloproliferative disease: Secondary | ICD-10-CM

## 2021-09-05 DIAGNOSIS — D46Z Other myelodysplastic syndromes: Secondary | ICD-10-CM | POA: Diagnosis not present

## 2021-09-05 LAB — CBC WITH DIFFERENTIAL (CANCER CENTER ONLY)
Abs Immature Granulocytes: 18.38 10*3/uL — ABNORMAL HIGH (ref 0.00–0.07)
Basophils Absolute: 0.4 10*3/uL — ABNORMAL HIGH (ref 0.0–0.1)
Basophils Relative: 0 %
Eosinophils Absolute: 0 10*3/uL (ref 0.0–0.5)
Eosinophils Relative: 0 %
HCT: 23.8 % — ABNORMAL LOW (ref 36.0–46.0)
Hemoglobin: 8 g/dL — ABNORMAL LOW (ref 12.0–15.0)
Immature Granulocytes: 20 %
Lymphocytes Relative: 5 %
Lymphs Abs: 4.4 10*3/uL — ABNORMAL HIGH (ref 0.7–4.0)
MCH: 34.8 pg — ABNORMAL HIGH (ref 26.0–34.0)
MCHC: 33.6 g/dL (ref 30.0–36.0)
MCV: 103.5 fL — ABNORMAL HIGH (ref 80.0–100.0)
Monocytes Absolute: 1.4 10*3/uL — ABNORMAL HIGH (ref 0.1–1.0)
Monocytes Relative: 1 %
Neutro Abs: 70 10*3/uL — ABNORMAL HIGH (ref 1.7–7.7)
Neutrophils Relative %: 74 %
Platelet Count: 130 10*3/uL — ABNORMAL LOW (ref 150–400)
RBC: 2.3 MIL/uL — ABNORMAL LOW (ref 3.87–5.11)
RDW: 25.9 % — ABNORMAL HIGH (ref 11.5–15.5)
Smear Review: NORMAL
WBC Count: 94.5 10*3/uL (ref 4.0–10.5)
WBC Morphology: 2
nRBC: 0 % (ref 0.0–0.2)

## 2021-09-05 LAB — CMP (CANCER CENTER ONLY)
ALT: 31 U/L (ref 0–44)
AST: 24 U/L (ref 15–41)
Albumin: 3.5 g/dL (ref 3.5–5.0)
Alkaline Phosphatase: 174 U/L — ABNORMAL HIGH (ref 38–126)
Anion gap: 6 (ref 5–15)
BUN: 14 mg/dL (ref 8–23)
CO2: 28 mmol/L (ref 22–32)
Calcium: 9.3 mg/dL (ref 8.9–10.3)
Chloride: 103 mmol/L (ref 98–111)
Creatinine: 0.62 mg/dL (ref 0.44–1.00)
GFR, Estimated: >60 mL/min (ref >60)
Glucose, Bld: 197 mg/dL — ABNORMAL HIGH (ref 70–99)
Potassium: 4.3 mmol/L (ref 3.5–5.1)
Sodium: 137 mmol/L (ref 135–145)
Total Bilirubin: 0.6 mg/dL (ref 0.3–1.2)
Total Protein: 6.8 g/dL (ref 6.5–8.1)

## 2021-09-05 LAB — SAMPLE TO BLOOD BANK

## 2021-09-05 MED ORDER — DARBEPOETIN ALFA 300 MCG/0.6ML IJ SOSY
300.0000 ug | PREFILLED_SYRINGE | Freq: Once | INTRAMUSCULAR | Status: AC
  Administered 2021-09-05: 300 ug via SUBCUTANEOUS
  Filled 2021-09-05: qty 0.6

## 2021-09-06 DIAGNOSIS — D471 Chronic myeloproliferative disease: Secondary | ICD-10-CM | POA: Diagnosis not present

## 2021-09-09 DIAGNOSIS — C4A0 Merkel cell carcinoma of lip: Secondary | ICD-10-CM | POA: Diagnosis not present

## 2021-09-09 DIAGNOSIS — D471 Chronic myeloproliferative disease: Secondary | ICD-10-CM | POA: Diagnosis not present

## 2021-09-14 ENCOUNTER — Telehealth: Payer: Self-pay | Admitting: Hematology and Oncology

## 2021-09-14 DIAGNOSIS — C4A0 Merkel cell carcinoma of lip: Secondary | ICD-10-CM | POA: Diagnosis not present

## 2021-09-14 DIAGNOSIS — C7B1 Secondary Merkel cell carcinoma: Secondary | ICD-10-CM | POA: Diagnosis not present

## 2021-09-14 NOTE — Telephone Encounter (Signed)
Per 8/2 phone line pt called to r/s appointment  r/s per pt

## 2021-09-19 ENCOUNTER — Other Ambulatory Visit: Payer: Self-pay

## 2021-09-19 ENCOUNTER — Inpatient Hospital Stay: Payer: Medicare Other

## 2021-09-19 ENCOUNTER — Telehealth: Payer: Self-pay | Admitting: *Deleted

## 2021-09-19 ENCOUNTER — Inpatient Hospital Stay: Payer: Medicare Other | Attending: Oncology

## 2021-09-19 ENCOUNTER — Other Ambulatory Visit: Payer: Self-pay | Admitting: *Deleted

## 2021-09-19 VITALS — BP 125/59 | HR 84 | Temp 98.0°F | Resp 18

## 2021-09-19 DIAGNOSIS — D649 Anemia, unspecified: Secondary | ICD-10-CM

## 2021-09-19 DIAGNOSIS — D469 Myelodysplastic syndrome, unspecified: Secondary | ICD-10-CM

## 2021-09-19 DIAGNOSIS — D471 Chronic myeloproliferative disease: Secondary | ICD-10-CM

## 2021-09-19 DIAGNOSIS — D46Z Other myelodysplastic syndromes: Secondary | ICD-10-CM | POA: Diagnosis not present

## 2021-09-19 LAB — CBC WITH DIFFERENTIAL (CANCER CENTER ONLY)
Abs Immature Granulocytes: 27.6 10*3/uL — ABNORMAL HIGH (ref 0.00–0.07)
Basophils Absolute: 0.1 10*3/uL (ref 0.0–0.1)
Basophils Relative: 0 %
Eosinophils Absolute: 0 10*3/uL (ref 0.0–0.5)
Eosinophils Relative: 0 %
HCT: 18.8 % — ABNORMAL LOW (ref 36.0–46.0)
Hemoglobin: 6.5 g/dL — CL (ref 12.0–15.0)
Immature Granulocytes: 25 %
Lymphocytes Relative: 4 %
Lymphs Abs: 4.4 10*3/uL — ABNORMAL HIGH (ref 0.7–4.0)
MCH: 36.5 pg — ABNORMAL HIGH (ref 26.0–34.0)
MCHC: 34.6 g/dL (ref 30.0–36.0)
MCV: 105.6 fL — ABNORMAL HIGH (ref 80.0–100.0)
Monocytes Absolute: 2.5 10*3/uL — ABNORMAL HIGH (ref 0.1–1.0)
Monocytes Relative: 2 %
Neutro Abs: 75.8 10*3/uL — ABNORMAL HIGH (ref 1.7–7.7)
Neutrophils Relative %: 69 %
Platelet Count: 87 10*3/uL — ABNORMAL LOW (ref 150–400)
RBC: 1.78 MIL/uL — ABNORMAL LOW (ref 3.87–5.11)
RDW: 28.5 % — ABNORMAL HIGH (ref 11.5–15.5)
Smear Review: NORMAL
WBC Count: 110.3 10*3/uL (ref 4.0–10.5)
WBC Morphology: 1
nRBC: 0 % (ref 0.0–0.2)

## 2021-09-19 LAB — CMP (CANCER CENTER ONLY)
ALT: 26 U/L (ref 0–44)
AST: 21 U/L (ref 15–41)
Albumin: 3.7 g/dL (ref 3.5–5.0)
Alkaline Phosphatase: 144 U/L — ABNORMAL HIGH (ref 38–126)
Anion gap: 8 (ref 5–15)
BUN: 17 mg/dL (ref 8–23)
CO2: 26 mmol/L (ref 22–32)
Calcium: 9.1 mg/dL (ref 8.9–10.3)
Chloride: 104 mmol/L (ref 98–111)
Creatinine: 0.67 mg/dL (ref 0.44–1.00)
GFR, Estimated: >60 mL/min (ref >60)
Glucose, Bld: 161 mg/dL — ABNORMAL HIGH (ref 70–99)
Potassium: 4.5 mmol/L (ref 3.5–5.1)
Sodium: 138 mmol/L (ref 135–145)
Total Bilirubin: 0.4 mg/dL (ref 0.3–1.2)
Total Protein: 7.3 g/dL (ref 6.5–8.1)

## 2021-09-19 LAB — SAMPLE TO BLOOD BANK

## 2021-09-19 LAB — PREPARE RBC (CROSSMATCH)

## 2021-09-19 MED ORDER — DARBEPOETIN ALFA 300 MCG/0.6ML IJ SOSY
300.0000 ug | PREFILLED_SYRINGE | Freq: Once | INTRAMUSCULAR | Status: AC
  Administered 2021-09-19: 300 ug via SUBCUTANEOUS
  Filled 2021-09-19: qty 0.6

## 2021-09-19 NOTE — Progress Notes (Signed)
Ordered 2 units PRBCs per verbal order from Dr. Chryl Heck. Transfusion scheduled for tomorrow 09/20/21. Appointment originally scheduled for today 09/19/21, but patient unable to stay due to other obligations. Patient states she feels fine and is surprised her HGB is low. Contacted [facility] - orders confirmed w/Beth.

## 2021-09-19 NOTE — Telephone Encounter (Signed)
CRITICAL VALUE STICKER  CRITICAL VALUE:WBC 110.3; HGB 6.5  RECEIVER (on-site recipient of call):Sandi K, RN  DATE & TIME NOTIFIED: 09/19/21; 4098  MESSENGER (representative from lab):Heather  MD NOTIFIED: Dr. Chryl Heck  TIME OF NOTIFICATION:0854  RESPONSE: Information acknowledged. 2 units PRBCs ordered. Scheduled for tomorrow as patient not available today.

## 2021-09-19 NOTE — Patient Instructions (Signed)

## 2021-09-19 NOTE — Progress Notes (Signed)
Patient's hemoglobin was 6.5 today. Patient denies any s/s of shortness of breath, fatigue, chest pain, or dizziness. No bleeding noted at this time. Patient received aranesp injection and scheduled for 2 units of PRBC on 8/8 at 12:00 PM. Patient agreeable to appointment. Patient d/c in stable condition and with blue bracelet on.

## 2021-09-20 ENCOUNTER — Inpatient Hospital Stay: Payer: Medicare Other

## 2021-09-20 DIAGNOSIS — D649 Anemia, unspecified: Secondary | ICD-10-CM

## 2021-09-20 DIAGNOSIS — D469 Myelodysplastic syndrome, unspecified: Secondary | ICD-10-CM

## 2021-09-20 DIAGNOSIS — D46Z Other myelodysplastic syndromes: Secondary | ICD-10-CM | POA: Diagnosis not present

## 2021-09-20 MED ORDER — SODIUM CHLORIDE 0.9% IV SOLUTION
250.0000 mL | Freq: Once | INTRAVENOUS | Status: AC
  Administered 2021-09-20: 250 mL via INTRAVENOUS

## 2021-09-20 MED ORDER — DIPHENHYDRAMINE HCL 25 MG PO CAPS
25.0000 mg | ORAL_CAPSULE | Freq: Once | ORAL | Status: AC
  Administered 2021-09-20: 25 mg via ORAL
  Filled 2021-09-20: qty 1

## 2021-09-20 MED ORDER — ACETAMINOPHEN 325 MG PO TABS
650.0000 mg | ORAL_TABLET | Freq: Once | ORAL | Status: AC
  Administered 2021-09-20: 650 mg via ORAL
  Filled 2021-09-20: qty 2

## 2021-09-20 NOTE — Patient Instructions (Signed)

## 2021-09-21 LAB — BPAM RBC
Blood Product Expiration Date: 202309042359
Blood Product Expiration Date: 202309042359
ISSUE DATE / TIME: 202308081249
ISSUE DATE / TIME: 202308081249
Unit Type and Rh: 600
Unit Type and Rh: 600

## 2021-09-21 LAB — TYPE AND SCREEN
ABO/RH(D): AB NEG
Antibody Screen: NEGATIVE
Unit division: 0
Unit division: 0

## 2021-09-22 DIAGNOSIS — R7309 Other abnormal glucose: Secondary | ICD-10-CM | POA: Diagnosis not present

## 2021-09-22 DIAGNOSIS — I429 Cardiomyopathy, unspecified: Secondary | ICD-10-CM | POA: Diagnosis not present

## 2021-09-22 DIAGNOSIS — Z01818 Encounter for other preprocedural examination: Secondary | ICD-10-CM | POA: Diagnosis not present

## 2021-09-22 DIAGNOSIS — C7B1 Secondary Merkel cell carcinoma: Secondary | ICD-10-CM | POA: Diagnosis not present

## 2021-09-22 DIAGNOSIS — Z01812 Encounter for preprocedural laboratory examination: Secondary | ICD-10-CM | POA: Diagnosis not present

## 2021-09-23 ENCOUNTER — Other Ambulatory Visit: Payer: Self-pay | Admitting: *Deleted

## 2021-09-23 ENCOUNTER — Telehealth: Payer: Self-pay | Admitting: Cardiology

## 2021-09-23 DIAGNOSIS — D471 Chronic myeloproliferative disease: Secondary | ICD-10-CM

## 2021-09-23 DIAGNOSIS — M542 Cervicalgia: Secondary | ICD-10-CM | POA: Diagnosis not present

## 2021-09-23 NOTE — Telephone Encounter (Signed)
Caller wants consult regarding patient having abnormal results. Patient is being scheduling for surgery.

## 2021-09-23 NOTE — Telephone Encounter (Signed)
Left message for Tanya Mitchell advising to fax over a clearance request form for pre-op to review.

## 2021-09-23 NOTE — Telephone Encounter (Deleted)
I am not sure why this was sent to me. I do not see anything for for pre op clearance.

## 2021-09-23 NOTE — Telephone Encounter (Signed)
Calling to make sure that Dr. Johney Frame is aware that pt has an upcoming surgery on 10/06/21. She would like to know if Johney Frame would like for pt to have ECHO sooner than scheduled due to BMP being elevated. Claiborne Billings would like a callback regarding this matter. Please advise

## 2021-09-23 NOTE — Telephone Encounter (Signed)
Left a message for [facility] with requesting office. I stated that we will need a clearance request to be faxed to [phone] att: pre op team  Once the request has been received the pre op provider will review.

## 2021-09-26 ENCOUNTER — Other Ambulatory Visit: Payer: Self-pay

## 2021-09-26 ENCOUNTER — Inpatient Hospital Stay: Payer: Medicare Other

## 2021-09-26 DIAGNOSIS — D471 Chronic myeloproliferative disease: Secondary | ICD-10-CM

## 2021-09-26 DIAGNOSIS — D46Z Other myelodysplastic syndromes: Secondary | ICD-10-CM | POA: Diagnosis not present

## 2021-09-26 LAB — CMP (CANCER CENTER ONLY)
ALT: 43 U/L (ref 0–44)
AST: 31 U/L (ref 15–41)
Albumin: 3.7 g/dL (ref 3.5–5.0)
Alkaline Phosphatase: 202 U/L — ABNORMAL HIGH (ref 38–126)
Anion gap: 7 (ref 5–15)
BUN: 14 mg/dL (ref 8–23)
CO2: 27 mmol/L (ref 22–32)
Calcium: 9 mg/dL (ref 8.9–10.3)
Chloride: 101 mmol/L (ref 98–111)
Creatinine: 0.54 mg/dL (ref 0.44–1.00)
GFR, Estimated: >60 mL/min (ref >60)
Glucose, Bld: 128 mg/dL — ABNORMAL HIGH (ref 70–99)
Potassium: 3.8 mmol/L (ref 3.5–5.1)
Sodium: 135 mmol/L (ref 135–145)
Total Bilirubin: 0.5 mg/dL (ref 0.3–1.2)
Total Protein: 7 g/dL (ref 6.5–8.1)

## 2021-09-26 LAB — CBC WITH DIFFERENTIAL (CANCER CENTER ONLY)
Abs Immature Granulocytes: 23.19 10*3/uL — ABNORMAL HIGH (ref 0.00–0.07)
Basophils Absolute: 0.1 10*3/uL (ref 0.0–0.1)
Basophils Relative: 0 %
Eosinophils Absolute: 0 10*3/uL (ref 0.0–0.5)
Eosinophils Relative: 0 %
HCT: 27.1 % — ABNORMAL LOW (ref 36.0–46.0)
Hemoglobin: 9.4 g/dL — ABNORMAL LOW (ref 12.0–15.0)
Immature Granulocytes: 20 %
Lymphocytes Relative: 3 %
Lymphs Abs: 3.4 10*3/uL (ref 0.7–4.0)
MCH: 34.9 pg — ABNORMAL HIGH (ref 26.0–34.0)
MCHC: 34.7 g/dL (ref 30.0–36.0)
MCV: 100.7 fL — ABNORMAL HIGH (ref 80.0–100.0)
Monocytes Absolute: 1.5 10*3/uL — ABNORMAL HIGH (ref 0.1–1.0)
Monocytes Relative: 1 %
Neutro Abs: 88.1 10*3/uL — ABNORMAL HIGH (ref 1.7–7.7)
Neutrophils Relative %: 76 %
Platelet Count: 59 10*3/uL — ABNORMAL LOW (ref 150–400)
RBC: 2.69 MIL/uL — ABNORMAL LOW (ref 3.87–5.11)
RDW: 24.2 % — ABNORMAL HIGH (ref 11.5–15.5)
Smear Review: NORMAL
WBC Count: 116.3 10*3/uL (ref 4.0–10.5)
nRBC: 0 % (ref 0.0–0.2)

## 2021-09-26 LAB — IRON AND IRON BINDING CAPACITY (CC-WL,HP ONLY)
Iron: 194 ug/dL — ABNORMAL HIGH (ref 28–170)
Saturation Ratios: 74 % — ABNORMAL HIGH (ref 10.4–31.8)
TIBC: 263 ug/dL (ref 250–450)
UIBC: 69 ug/dL — ABNORMAL LOW (ref 148–442)

## 2021-09-26 LAB — SAMPLE TO BLOOD BANK

## 2021-09-26 LAB — FERRITIN: Ferritin: 1540 ng/mL — ABNORMAL HIGH (ref 11–307)

## 2021-09-26 LAB — URIC ACID: Uric Acid, Serum: 3.2 mg/dL (ref 2.5–7.1)

## 2021-09-26 NOTE — Telephone Encounter (Signed)
Left message again for [facility] with requesting office that we need to have a clearance request faxed to [place] before we can proceed.  Fax # [phone]

## 2021-09-27 NOTE — Telephone Encounter (Signed)
Tanya Mitchell, called back and stated kelly, NP does have a cell phone though she was not sure of the number and felt it best to have Dr. Johney Frame call Claiborne Billings tomorrow when she is back in the office.   Dr. Johney Frame here is the number to reach [facility], NP: Call [phone]

## 2021-09-27 NOTE — Telephone Encounter (Signed)
Shirlean Mylar, called back and said she just s/w Claiborne Billings, NP to relay the question Claiborne Billings, NP wanted to s/w Dr. Johney Frame about.   Pt had preop testing done with [facility] on 09/22/21. In that were labs done. BNP was elevated to 741. Question from NP, Claiborne Billings is: Does the pt need her echo to be done sooner before her planned surgery date of 10/06/21. Echo at this time is planned for 10/25/21.   Shirlean Mylar, is relaying Claiborne Billings, NP is not needing pre op clearance, just needs to know if echo needs to be done before surgery date.   I assured Shirlean Mylar that I will send a message to Dr. Johney Frame. I will forward to our preop provider as an FYI as well.

## 2021-09-27 NOTE — Telephone Encounter (Signed)
I left a message to call back that Dr. Johney Frame is asking if there is a number she can call to d/w Claiborne Billings, NP further regarding the pt.   Left message if NP has cell # that Dr. Johney Frame can call her on as I understand Claiborne Billings, NP is not in the office today.   I will wait to hear back from [place] or Shirlean Mylar before sending message to Dr. Johney Frame with what number to call Claiborne Billings on.

## 2021-09-27 NOTE — Telephone Encounter (Signed)
Robin from [place] left me a vm stating that kelly had been trying to reach our office. See previous notes. Per Robin's message today Claiborne Billings is not needing pre op clearance, but states that she wanted to s/w someone in our office about the pt.  Per Mariel Kansky will call us back today to discuss further.

## 2021-09-29 DIAGNOSIS — C7B1 Secondary Merkel cell carcinoma: Secondary | ICD-10-CM | POA: Diagnosis not present

## 2021-09-29 DIAGNOSIS — Z85820 Personal history of malignant melanoma of skin: Secondary | ICD-10-CM | POA: Diagnosis not present

## 2021-09-29 DIAGNOSIS — D62 Acute posthemorrhagic anemia: Secondary | ICD-10-CM | POA: Diagnosis not present

## 2021-09-29 DIAGNOSIS — E7800 Pure hypercholesterolemia, unspecified: Secondary | ICD-10-CM | POA: Diagnosis not present

## 2021-09-29 DIAGNOSIS — D471 Chronic myeloproliferative disease: Secondary | ICD-10-CM | POA: Diagnosis not present

## 2021-09-29 DIAGNOSIS — R918 Other nonspecific abnormal finding of lung field: Secondary | ICD-10-CM | POA: Diagnosis not present

## 2021-09-29 DIAGNOSIS — I10 Essential (primary) hypertension: Secondary | ICD-10-CM | POA: Diagnosis not present

## 2021-09-29 DIAGNOSIS — E119 Type 2 diabetes mellitus without complications: Secondary | ICD-10-CM | POA: Diagnosis not present

## 2021-09-29 DIAGNOSIS — M96840 Postprocedural hematoma of a musculoskeletal structure following a musculoskeletal system procedure: Secondary | ICD-10-CM | POA: Diagnosis not present

## 2021-09-29 DIAGNOSIS — C4A39 Merkel cell carcinoma of other parts of face: Secondary | ICD-10-CM | POA: Diagnosis not present

## 2021-09-29 DIAGNOSIS — C770 Secondary and unspecified malignant neoplasm of lymph nodes of head, face and neck: Secondary | ICD-10-CM | POA: Diagnosis not present

## 2021-09-29 DIAGNOSIS — C4A0 Merkel cell carcinoma of lip: Secondary | ICD-10-CM | POA: Diagnosis not present

## 2021-10-03 ENCOUNTER — Inpatient Hospital Stay: Payer: Medicare Other

## 2021-10-03 NOTE — Telephone Encounter (Signed)
Covering preop today. As of last notes, this was routed to Dr. Johney Frame to call surgery office but do not see further follow-up. Patient has since had surgery 09/29/21 for underwent excision of a merkel cell of her right lip and right selective neck dissection - it is not clear to me from the notes below whether this was the surgery that was pending clearance (notes below state surgical date 10/06/21). Will route to callback to help clarify if any additional input is needed at this time.

## 2021-10-04 NOTE — Telephone Encounter (Signed)
Spoke with requesting provider's office they state that patient has her surgery on 09/29/21 and has a post-op appointment on 10/06/21. The notes were for the the surgery on 09/29/21. They state that there is no further surgeries scheduled for the patient at this time.

## 2021-10-06 DIAGNOSIS — C4A0 Merkel cell carcinoma of lip: Secondary | ICD-10-CM | POA: Diagnosis not present

## 2021-10-06 DIAGNOSIS — Z483 Aftercare following surgery for neoplasm: Secondary | ICD-10-CM | POA: Diagnosis not present

## 2021-10-12 DIAGNOSIS — Z9889 Other specified postprocedural states: Secondary | ICD-10-CM | POA: Diagnosis not present

## 2021-10-12 DIAGNOSIS — C7B1 Secondary Merkel cell carcinoma: Secondary | ICD-10-CM | POA: Diagnosis not present

## 2021-10-12 DIAGNOSIS — C4A0 Merkel cell carcinoma of lip: Secondary | ICD-10-CM | POA: Diagnosis not present

## 2021-10-14 ENCOUNTER — Ambulatory Visit
Admission: RE | Admit: 2021-10-14 | Discharge: 2021-10-14 | Disposition: A | Discharge status: Home / Self Care | Payer: Self-pay | Source: Ambulatory Visit | Attending: Radiation Oncology | Admitting: Radiation Oncology

## 2021-10-14 ENCOUNTER — Other Ambulatory Visit: Payer: Self-pay | Admitting: Radiation Oncology

## 2021-10-14 DIAGNOSIS — C770 Secondary and unspecified malignant neoplasm of lymph nodes of head, face and neck: Secondary | ICD-10-CM

## 2021-10-14 DIAGNOSIS — C778 Secondary and unspecified malignant neoplasm of lymph nodes of multiple regions: Secondary | ICD-10-CM

## 2021-10-18 ENCOUNTER — Encounter: Payer: Self-pay | Admitting: Hematology and Oncology

## 2021-10-18 ENCOUNTER — Other Ambulatory Visit: Payer: Self-pay | Admitting: *Deleted

## 2021-10-18 ENCOUNTER — Inpatient Hospital Stay: Payer: Medicare Other

## 2021-10-18 ENCOUNTER — Inpatient Hospital Stay: Payer: Medicare Other | Attending: Oncology | Admitting: Hematology and Oncology

## 2021-10-18 ENCOUNTER — Other Ambulatory Visit: Payer: Self-pay

## 2021-10-18 ENCOUNTER — Telehealth: Payer: Self-pay | Admitting: *Deleted

## 2021-10-18 VITALS — BP 98/42 | HR 114 | Temp 97.7°F | Resp 20 | Ht 62.0 in | Wt 96.8 lb

## 2021-10-18 DIAGNOSIS — Z79899 Other long term (current) drug therapy: Secondary | ICD-10-CM

## 2021-10-18 DIAGNOSIS — C4A0 Merkel cell carcinoma of lip: Secondary | ICD-10-CM | POA: Diagnosis not present

## 2021-10-18 DIAGNOSIS — D471 Chronic myeloproliferative disease: Secondary | ICD-10-CM

## 2021-10-18 DIAGNOSIS — D619 Aplastic anemia, unspecified: Secondary | ICD-10-CM | POA: Diagnosis not present

## 2021-10-18 DIAGNOSIS — I5023 Acute on chronic systolic (congestive) heart failure: Secondary | ICD-10-CM

## 2021-10-18 DIAGNOSIS — D649 Anemia, unspecified: Secondary | ICD-10-CM

## 2021-10-18 DIAGNOSIS — D72829 Elevated white blood cell count, unspecified: Secondary | ICD-10-CM

## 2021-10-18 DIAGNOSIS — D46Z Other myelodysplastic syndromes: Secondary | ICD-10-CM | POA: Diagnosis not present

## 2021-10-18 DIAGNOSIS — I5022 Chronic systolic (congestive) heart failure: Secondary | ICD-10-CM

## 2021-10-18 DIAGNOSIS — I428 Other cardiomyopathies: Secondary | ICD-10-CM

## 2021-10-18 DIAGNOSIS — D469 Myelodysplastic syndrome, unspecified: Secondary | ICD-10-CM

## 2021-10-18 LAB — SAMPLE TO BLOOD BANK

## 2021-10-18 LAB — CBC WITH DIFFERENTIAL (CANCER CENTER ONLY)
Abs Immature Granulocytes: 18.64 10*3/uL — ABNORMAL HIGH (ref 0.00–0.07)
Basophils Absolute: 0 10*3/uL (ref 0.0–0.1)
Basophils Relative: 0 %
Eosinophils Absolute: 0 10*3/uL (ref 0.0–0.5)
Eosinophils Relative: 0 %
HCT: 20.3 % — ABNORMAL LOW (ref 36.0–46.0)
Hemoglobin: 7 g/dL — ABNORMAL LOW (ref 12.0–15.0)
Immature Granulocytes: 18 %
Lymphocytes Relative: 4 %
Lymphs Abs: 3.7 10*3/uL (ref 0.7–4.0)
MCH: 33.7 pg (ref 26.0–34.0)
MCHC: 34.5 g/dL (ref 30.0–36.0)
MCV: 97.6 fL (ref 80.0–100.0)
Monocytes Absolute: 2.4 10*3/uL — ABNORMAL HIGH (ref 0.1–1.0)
Monocytes Relative: 2 %
Neutro Abs: 79.6 10*3/uL — ABNORMAL HIGH (ref 1.7–7.7)
Neutrophils Relative %: 76 %
Platelet Count: 61 10*3/uL — ABNORMAL LOW (ref 150–400)
RBC: 2.08 MIL/uL — ABNORMAL LOW (ref 3.87–5.11)
RDW: 22.2 % — ABNORMAL HIGH (ref 11.5–15.5)
WBC Count: 104.4 10*3/uL (ref 4.0–10.5)
nRBC: 0 % (ref 0.0–0.2)

## 2021-10-18 LAB — IRON AND IRON BINDING CAPACITY (CC-WL,HP ONLY)
Iron: 192 ug/dL — ABNORMAL HIGH (ref 28–170)
Saturation Ratios: 59 % — ABNORMAL HIGH (ref 10.4–31.8)
TIBC: 323 ug/dL (ref 250–450)
UIBC: 131 ug/dL — ABNORMAL LOW (ref 148–442)

## 2021-10-18 LAB — CMP (CANCER CENTER ONLY)
ALT: 19 U/L (ref 0–44)
AST: 18 U/L (ref 15–41)
Albumin: 4 g/dL (ref 3.5–5.0)
Alkaline Phosphatase: 141 U/L — ABNORMAL HIGH (ref 38–126)
Anion gap: 7 (ref 5–15)
BUN: 25 mg/dL — ABNORMAL HIGH (ref 8–23)
CO2: 27 mmol/L (ref 22–32)
Calcium: 10 mg/dL (ref 8.9–10.3)
Chloride: 100 mmol/L (ref 98–111)
Creatinine: 0.57 mg/dL (ref 0.44–1.00)
GFR, Estimated: >60 mL/min (ref >60)
Glucose, Bld: 159 mg/dL — ABNORMAL HIGH (ref 70–99)
Potassium: 4.2 mmol/L (ref 3.5–5.1)
Sodium: 134 mmol/L — ABNORMAL LOW (ref 135–145)
Total Bilirubin: 0.5 mg/dL (ref 0.3–1.2)
Total Protein: 7.6 g/dL (ref 6.5–8.1)

## 2021-10-18 LAB — PREPARE RBC (CROSSMATCH)

## 2021-10-18 MED ORDER — SODIUM CHLORIDE 0.9% IV SOLUTION: 250.0000 mL | Freq: Once | INTRAVENOUS | Status: DC

## 2021-10-18 MED ORDER — DAPAGLIFLOZIN PROPANEDIOL 10 MG PO TABS: 10.0000 mg | ORAL_TABLET | Freq: Every day | ORAL | 9 refills | Status: DC

## 2021-10-18 MED ORDER — DARBEPOETIN ALFA 300 MCG/0.6ML IJ SOSY
300.0000 ug | PREFILLED_SYRINGE | Freq: Once | INTRAMUSCULAR | Status: AC
  Administered 2021-10-18: 300 ug via SUBCUTANEOUS
  Filled 2021-10-18: qty 0.6

## 2021-10-18 NOTE — Progress Notes (Signed)
ID: Tanya Mitchell OB: 28-Oct-1945  MR#: [id number]  CSN#:[id number]  Patient Care Team: Tanya Pike, MD as PCP - General (Hematology and Oncology) Tanya Junes, NP (Inactive) as Nurse Practitioner (Nurse Practitioner) Tanya Charleston, MD as Consulting Physician (Obstetrics and Gynecology) Tanya Contes, MD as Referring Physician (Hematology and Oncology) Tanya Ames, MD as Referring Physician (Otolaryngology)   CHIEF COMPLAINTS:   1)  Hx Left Breast Cancer 1995 (s/p left mastectomy)       2) Hx Right Breast Cancer 2007                   3)  Hx Melanoma, Stage IIIA       4) MPN with leukocytosis/thrombocytosis      CURRENT TREATMENT: hydrea; aranesp; allopurinol  INTERVAL HISTORY: Tanya Mitchell returns today for follow-up of her MPN and newly diagnosed Merkel cell carcinoma. Since last visit she underwent resection of the lip and right neck dissection September 29, 2021.  She also continues on Hydrea twice daily.  Final pathology showed 2.2 cm tumor, negative margins, 19 out of 20 positive lymph nodes.  She was presented in [facility] tumor [facility] and recommendation was to consider adjuvant radiation as well as adjuvant immunotherapy.  She is here with her husband today.  She tells me that she is very tired.  She is able to drink and eat well.  She has lost about 20 pounds with this Merkel cell diagnosis and recent surgery.  She is understandably anxious about having to go through systemic therapy.  Rest of the pertinent 10 point ROS reviewed and negative   REVIEW OF SYSTEMS: A detailed review of systems was otherwise stable   COVID 19 VACCINATION STATUS: [place] x2, most recently 04/2019; had COVID May 2022   HISTORY OF PRESENT ILLNESS: From the earlier summary:  Tanya Mitchell's history of breast cancer dates back to June of 1995, when she had left modified radical mastectomy under Tanya Mitchell for a pT1c pN1, stage IIA invasive ductal carcinoma, grade 2, estrogen receptor 97%  positive, progesterone receptor 45% positive, treated according to CALGB 9394, sequential arm (doxorubicin x3, fourth dose apparently omitted, followed by high-dose cyclophosphamide x3), followed by tamoxifen for 5 years. There has been no evidence of disease recurrence.  Further, in October of 2007 she underwent right lumpectomy for a ductal carcinoma in situ, high-grade, estrogen and progesterone receptor negative, followed by adjuvant radiation.  In July of 2013 the patient was noted to have an irregular mole in her left upper back. It is not clear to me whether this might be related to her prior left breast irradiation. Shave biopsy of this area 09/15/2011 by Dr. Derrel Mitchell [phone]) showed a superficial spreading malignant melanoma, with Breslow depth 1.45 mm, Clark's level IV. There was brisk host response and 8 mitoses per high power field were noted. There was focal regression but no definitive ulceration. There was no satellitosis. There was no vascular and urgent. Margins were positive, but cleared by wide excision under Dr Tanya Mitchell 10/10/2011. There was some residual malignant melanoma in situ but margins to 2 cm were obtained, with advancement flap closure of the skin defect. In addition left axillary sentinel lymph node mapping was performed showing metastatic melanoma in the single lymph node removed.  Tanya Mitchell sought a second opinion at [facility] under Dr Tanya Mitchell. There was a full discussion regarding completion nodal resection, use of adjuvant interferon, and BRAF testing with a view to participation in a research protocol then available.  The patient considered all these options and after much discussion the decision was made not to pursue full nodal dissection, partly because the area in question had a ready undergone surgery and radiation. The patient opted against interferon adjuvant therapy because of its very marginal benefit and significant side effects. She declined  consideration of a research study and BRAF testing was not performed.  Her subsequent history is as detailed below.   PAST MEDICAL HISTORY: Past Medical History:  Diagnosis Date   Breast cancer ([place]) 1995 and 2007   Cardiomyopathy secondary to chemotherapy [facility])    has improved    Hyperlipidemia    Hypertension    under control, has been on med. x 3 yrs.   Melanoma in situ of back ([place]) 09/2011   left   Personal history of chemotherapy    Personal history of radiation therapy    Skin cancer 2013   Melanoma    PAST SURGICAL HISTORY: Past Surgical History:  Procedure Laterality Date   BREAST LUMPECTOMY Right 12/21/2005   right   MASTECTOMY Left 1995   with breast reconstruction - left   RIGHT/LEFT HEART CATH AND CORONARY ANGIOGRAPHY N/A 04/20/2021   Procedure: RIGHT/LEFT HEART CATH AND CORONARY ANGIOGRAPHY;  Surgeon: Jettie Booze, MD;  Location: [facility];  Service: Cardiovascular;  Laterality: N/A;    FAMILY HISTORY Family History  Problem Relation Age of Onset   Lung cancer Father    Cancer Father        lung   Autism Son    Breast cancer Daughter    the patient's father died from complications of lung cancer at the age of 30. He was a heavy smoker. The patient's mother died at the age of 30. Tanya Mitchell had no siblings. She underwent genetic testing for breast and ovarian cancer panel April of 2014. There were no demonstrable mutations in the BRCA or the other genes in the panel   GYNECOLOGIC HISTORY:  Menarche age 72, first live birth age 3. She is GX P2. She entered menopause at age 27, when she received her chemotherapy. She did not use hormone replacement. She did use birth control remotely, for approximately 14 years, with no complications.   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  They recently me moved to a new home in the [facility] area.  [name]'s son Tanya Mitchell is handicapped, and works as an Training and development officer. [name] is Brewing technologist limited. Jaclene has 3 grandchildren and Marcola 2. Skylen grew up in an Database administrator denomination   ADVANCED DIRECTIVES: In place   HEALTH MAINTENANCE:  Social History   Tobacco Use   Smoking status: Former    Types: Cigarettes    Quit date: 02/14/1964    Years since quitting: 57.7   Smokeless tobacco: Never  Vaping Use   Vaping Use: Never used  Substance Use Topics   Alcohol use: Yes    Comment: daily glass of wine   Drug use: No     Colonoscopy: 2011  PAP: November 2014  Bone density: January 2013, osteopenia  Lipid panel:   August 2013/Dr. Barnes   No Known Allergies  Current Outpatient Medications  Medication Sig Dispense Refill   albuterol (VENTOLIN HFA) 108 (90 Base)  MCG/ACT inhaler Inhale 1-2 puffs into the lungs every 6 (six) hours as needed for shortness of breath or wheezing.     allopurinol (ZYLOPRIM) 300 MG tablet Take 1 tablet (300 mg total) by mouth daily. 60 tablet 6   aspirin EC 81 MG tablet Take 1 tablet (81 mg total) by mouth daily. Swallow whole. 90 tablet 3   atorvastatin (LIPITOR) 80 MG tablet Take 1 tablet (80 mg total) by mouth daily. 90 tablet 3   cholecalciferol (VITAMIN D) 1000 UNITS tablet Take 2,000 Units by mouth in the morning.     dapagliflozin propanediol (FARXIGA) 10 MG TABS tablet Take 1 tablet (10 mg total) by mouth daily before breakfast. 30 tablet 9   furosemide (LASIX) 40 MG tablet Take 1 tablet (40 mg total) by mouth daily as needed. 90 tablet 1   hydroxyurea (HYDREA) 500 MG capsule Take 500 mg by mouth daily. In the evening. May take with food to minimize GI side effects.     Magnesium 400 MG TABS Take 1 tablet by mouth daily as needed. Only take on days taking furosemide 90 tablet 1    metFORMIN (GLUCOPHAGE) 500 MG tablet Take 0.5 tablets (250 mg total) by mouth in the morning and at bedtime.     metoprolol succinate (TOPROL XL) 25 MG 24 hr tablet Take 0.5 tablets (12.5 mg total) by mouth at bedtime. Please hold this med if systolic BP is 90 or less. 45 tablet 2   potassium chloride SA (KLOR-CON M) 20 MEQ tablet Take 1 tablet (20 mEq total) by mouth daily as needed. Only on days taking furosemide 90 tablet 1   sacubitril-valsartan (ENTRESTO) 24-26 MG Take 1 tablet by mouth 2 (two) times daily. 60 tablet 10   triamcinolone (KENALOG) 0.025 % ointment Apply topically as needed.     No current facility-administered medications for this visit.    OBJECTIVE: White woman who appears younger than stated age  76:   10/18/21 1344  BP: (!) 98/42  Pulse: (!) 114  Resp: 20  Temp: 97.7 F (36.5 C)  SpO2: 100%       Body mass index is 17.7 kg/m.    ECOG FS:1 - Symptomatic but completely ambulatory Filed Weights   10/18/21 1344  Weight: 96 lb 12.8 oz (43.9 kg)    Physical Exam Constitutional:      Comments: She appears very weak and pale today  HENT:     Head:     Comments: Surgical scar noted lower lip as well as the neck Musculoskeletal:        General: No swelling.  Skin:    General: Skin is warm and dry.  Neurological:     General: No focal deficit present.     Mental Status: She is alert.    LAB RESULTS:   Lab Results  Component Value Date   WBC 104.4 (HH) 10/18/2021   NEUTROABS 79.6 (H) 10/18/2021   HGB 7.0 (L) 10/18/2021   HCT 20.3 (L) 10/18/2021   MCV 97.6 10/18/2021   PLT 61 (L) 10/18/2021      Chemistry      Component Value Date/Time   NA 134 (L) 10/18/2021 1315   NA 138 04/25/2021 1018   NA 139 11/30/2015 1225   K 4.2 10/18/2021 1315   K 4.6 11/30/2015 1225   CL 100 10/18/2021 1315   CL 104 01/18/2012 0948   CO2 27 10/18/2021 1315   CO2 27 11/30/2015 1225   BUN 25 (H)  10/18/2021 1315   BUN 22 04/25/2021 1018   BUN 13.1 11/30/2015  1225   CREATININE 0.57 10/18/2021 1315   CREATININE 0.7 11/30/2015 1225      Component Value Date/Time   CALCIUM 10.0 10/18/2021 1315   CALCIUM 10.0 11/30/2015 1225   ALKPHOS 141 (H) 10/18/2021 1315   ALKPHOS 83 11/30/2015 1225   AST 18 10/18/2021 1315   AST 25 11/30/2015 1225   ALT 19 10/18/2021 1315   ALT 27 11/30/2015 1225   BILITOT 0.5 10/18/2021 1315   BILITOT 0.60 11/30/2015 1225      STUDIES: No results found.   ASSESSMENT: 76 y.o. BRCA negative [place] woman   (1) status post left mastectomy with TRAM reconstruction June of 1995 for a T1c N1, stage IIA invasive ductal carcinoma, grade 2, estrogen receptor 97% positive, progesterone receptor 45% positive, treated adjuvantly according to CALGB 9394 with 3 cycles of doxorubicin followed by 3 cycles of cyclophosphamide, then tamoxifen for 5 years, off therapy completed November of 2000  (2) status post right lumpectomy October 2007 for high-grade ductal carcinoma in situ, with negative margins estrogen and progesterone receptor negative, followed by adjuvant radiation therapy   (3) malignant melanoma, T2a N1a = stage IIIA, as follows:  (a) status post shave biopsy from the right upper back 09/15/2011 for a superficial spreading melanoma, Breslow depth 1.45 mm, Clark's level IV, with focal regression but no ulceration.  (b) status post wide excision and sentinel lymph node sampling 10/10/2011 with residual malignant melanoma in situ, but negative margins; the single sentinel lymph node was involved by melanoma with the largest subcapsular deposit measuring 0.22 mm, no evidence of capsular involvement or extracapsular extension  (c) completion nodal dissection was discussed, but not performed in part because the patient had already had a complete left axillary lymph node dissection at the time of her 1995 left mastectomy (10 lymph nodes were removed at that time)  (d) adjuvant interferon was discussed with the patient when she  visited [facility] in September 2013, but given that it side effects and marginal benefits the patient declined  (e) BRAF testing has not been done on the original tumor; this was also discussed with the patient at the time of her [facility] visit, as it might possibly lead to participation in a research protocol; the patient decided not to pursue that option  (4) PET scan 11/19/18/2015 shows nonspecific uptake only at T6.   (a) MRI of the thoracic spine November 2015 showed no evidence of metastatic disease  (b) repeat PET scan 12/02/2014 shows no residual or recurrent hypermetabolic tumor.  (5) thrombocytosis first noted October 2017, leukocytosis first noted November 2019  (a) bone marrow biopsy 04/03/2018 showed a hypercellular bone marrow with granulocytic and megakaryocytic proliferation, some of the megakaryocytes being small and/or hypolobulated.  There was no increase in blasts, no significant increase in reticulin fibers  (b) cytogenetics from bone marrow biopsy 04/03/2018 showed 46,XX[20].nuc ish (ABL1, BCR)x2  (c) molecular studies 12/18/2017 showed no mutations in JAK2 exons 12 and 14 (including V 617);  MPL exon 10 or CALR exon 9  (d) BCR/ABL 1 drawn 12/18/2017 showed 94% normal nuclei (6% with single fusion)  (e) repeat BCR/ABL 1 on 04/30/2018 showed 100% normal nuclei  (f) repeat BCR/ABL 1 on 02/27/2019: Translocation not detected  (6) bone marrow biopsy on 10/31/2019 shows a hypercellular marrow with myeloid and megakaryocytic hyperplasia but no increase in blasts.  (a) cytogenetics showed no metaphase cells for analysis  (b) Next generation  sequencing for myeloid disorders reveals mutations of  ASXL1, GATA2, and U2AF1. These findings support the impression of a  myeloid neoplasm.    (c) hydroxyurea 500 mg daily started March 2022 with Aranesp support every 4 weeks  (d) hydroxyurea held on 10/25/2020 secondary to progressive anemia, requiring transfusion; EPO increased to every 2 weeks  (e)  hydroxyurea currently at 500 mg  daily  PLAN:  She is currently taking Hydrea and darbepoetin as instructed. She is on hydrea 500 mg daily, darbo every 2 weeks. Labs today with stable leukocytosis.  We will arrange for 1 unit of packed red blood cell transfusion since she feels very fatigued.  Now with regards to newly diagnosed Merkel cell status post complete resection, we have discussed about considering adjuvant immunotherapy.  There is an open label phase 2 trial but completely resected [facility] who were randomly assigned to use 1 year of adjuvant nivolumab versus surveillance.  Disease-free survival rate of 24 months with 84% with nivolumab versus 73% with observation only.  Overall survival data is immature.  I have also discussed the PET/CT results which showed multiple tiny pulmonary nodules below PET/CT resolution however metastasis cannot be a consideration.  I believe she is a very high risk for recurrence or for advanced metastatic disease hence have strongly recommended considering adjuvant immunotherapy.  She will proceed with adjuvant radiation and then will start adjuvant immunotherapy with nivolumab for 480 mg every 4 weeks.  We have briefly discussed today about mechanism of action of immunotherapy and adverse effects and rare incidence of life-threatening and severe adverse effects.  We have also discussed the aggressive nature of Merkel cell carcinoma and prognosis briefly.  Total encounter time 40 minutes.Tanya Pike, MD   10/18/2021 3:05 PM  Oncology and Hematology [facility] [place], [place] [address] Tel. [phone]  Joylene Igo [phone]   *Total Encounter Time as defined by the Centers for Medicare and Medicaid Services includes, in addition to the face-to-face time of a patient visit (documented in the note above) non-face-to-face time: obtaining and reviewing outside history, ordering and reviewing medications, tests or procedures, care  coordination (communications with other health care professionals or caregivers) and documentation in the medical record.

## 2021-10-18 NOTE — Progress Notes (Signed)
No entry 

## 2021-10-18 NOTE — Telephone Encounter (Signed)
Per MD may run blood at 300 ml / hour. Informed treatment room nurse.

## 2021-10-19 LAB — BPAM RBC
Blood Product Expiration Date: 202309232359
ISSUE DATE / TIME: 202309051520
Unit Type and Rh: 600

## 2021-10-19 LAB — TYPE AND SCREEN
ABO/RH(D): AB NEG
Antibody Screen: NEGATIVE
Unit division: 0

## 2021-10-20 NOTE — Progress Notes (Signed)
b Radiation Oncology         [phone] ________________________________  Initial Outpatient Consultation  Name: Tanya Mitchell MRN: [id number]  Date: 10/21/2021  DOB: 06/12/1945  EH:MCNOB, Arletha Pili, MD  Francina Ames, MD   REFERRING PHYSICIAN: Francina Ames, MD  DIAGNOSIS:    ICD-10-CM   1. Merkel cell carcinoma of the lip ([place])  C4A.0     2. Carcinoma metastatic to lymph node of head and neck region ([place])  C77.0     3. Merkel cell carcinoma of lip (H[facility])  C4A.0       Merkel cell carcinoma of the lower lip with numerous lymph node metastases   Cancer Staging  Merkel cell carcinoma of lip ([place]) Staging form: Merkel Cell Carcinoma, AJCC 8th Edition - Pathologic stage from 10/21/2021: Stage IIIB (pT2, pN1b, cM0) - Signed by Eppie Gibson, MD on 10/21/2021   CHIEF COMPLAINT: Here to discuss management of merkel cell carcinoma   HISTORY OF PRESENT ILLNESS::Tanya Mitchell is a 76 y.o. female who first presented with a new right lower lip lesion and right facial swelling approximately 4-5 months ago.  Biopsy of the right medial lower lip lesion on 08/23/21 revealed findings consistent with neuroendocrine carcinoma, favoring merkel cell carcinoma.   PET scan on 09/09/21 revealed a 13 mm hypermetabolic mass within the right lower lip, and hypermetabolic right submandibular and right cervical metastatic adenopathy. PET also showed splenomegaly with mild diffusely elevated metabolic activity throughout the spleen, and multiple tiny pulmonary nodules measuring up to 5 mm without elevated metabolic activity.   Subsequently, the patient was referred to Dr. Nicolette Bang on 09/14/21 who recommended proceeding with wide excision of the lip lesion (with primary closure) and right neck dissection.   CT of the neck and thyroid with contrast on 09/22/21 showed the exophytic mass lesion arising from the eccentric right lower lip, measuring 1.9 x 1.9 x 1.8 cm, representing the patient's  known Merkel cell carcinoma. CT also showed numerous enlarged right cervical lymph nodes with abnormal morphology, many of which with necrotic changes, involving levels IA, IB, II, III and IV, as well as the right supraclavicular region.   The patient opted to proceed with excision of the lower lip lesion and lymph node dissections on 09/29/21 under the care of Dr. Nicolette Bang. Pathology from the procedure revealed findings consistent with ulcerated high grade neuroendocrine carcinoma (measuring 2.2 cm) consistent with Merkel cell carcinoma; all margins negative with carcinoma coming within 1 mm of the right margin) nodal status of 19/20 dissected lymph nodes positive for metastatic carcinoma measuring up to 3.5 cm, all with extranodal extension (positive lymph nodes consisted of 2 submental lymph nodes, 1 right submandibular lymph node, and 16 lymph nodes from levels 2A, 3, 4, and 5).   Accordingly, Dr. Nicolette Bang presented the patient's case at the [facility] tumor board, with disposition concluded for radiation and possible systemic therapy.   Following discussion with Dr. Chryl Heck on 10/18/21, the patient agreed to proceed with adjuvant immunotherapy consisting nivolumab every 4 weeks. This will be initiated following radiation.   Of note: The patient also has myeloproliferative disorder and is followed by Dr. Joan Mayans at [facility] medical oncology. Current treatment includes Aranesp injections q2 weeks and hydrea once daily.    Swallowing issues, if any: none  Weight Changes: has lost about 20 pounds since receiving her diagnosis related to difficulty eating after surgery (also related to hematologic disease)  Other symptoms: right lower and right submandibular facial swelling, feeling  very tired  Tobacco history, if any: quit smoking approximately 57 years ago  ETOH abuse, if any: approximately 7 drinks per week  Prior cancers, if any: Left breast cancer s/p mastectomy and chemo in 1995,  right breast cancer s/p lumpectomy followed by XRT in 2007 (hx of chemo induced cardiomyopathy), stage 3A melanoma on back s/p wide local excision and SLNB (positive) in 2013  She and her husband are going to [facility] October 6 through 9 for reunion  PATH DETAILS: 09-29-21  Final Pathologic Diagnosis    A. LOWER LIP, EXCISION:               Ulcerated high grade neuroendocrine carcinoma (2.2 cm) consistent with Merkel cell carcinoma              Tumor is 10m from right margin of resection              See Synoptic report and Comment   B. SUBMENTAL CONTENTS, DISSECTION:               2 out of 2 lymph nodes are involved by carcinoma with extranodal extension (2/2) .              Largest tumor deposit is 0.9cm   C. RIGHT SUBMANDIBULAR CONTENTS, DISSECTION:               Metastatic carcinoma (5cm) with extranodal extension (1/1).              Submandibular gland not involved by tumor   D. RIGHT NECK CONTENTS  LEVELS 2A, 3, 4, AND 5, DISSECTION:               Metastatic carcinoma with extranodal extension (16/16)               Largest deposit is 3.5cm              Tumor shows focal squamous differentiation   E. RIGHT NECK CONTENTS  LEVEL 2B, DISSECTION:               Negative for metastatic malignancy (0/1).  Electronically signed by AEstill Bakes MD on 10/11/2021 at  1:47 PM  Comment    Immunohistochemical studies are performed with adequate controls. The neoplasm is positive for INSM-1, synaptophysin (weak), CK20 (weak dot-like) and negative for Pan Mel, SOX10, TTF1, and CK7. Ki67 shows a proliferation rate of about 50%.   This case has been reviewed by another pathologist as per Departmental guidelines on reporting a malignancy.     I verify that I have personally reviewed all relevant slides/materials for this case and rendered or confirmed the diagnosis.  Synoptic Checklist  MERKEL CELL CARCINOMA OF THE SKIN  MERKEL CELL CARCINOMA OF THE SKIN - All Specimens  8th Edition -  Protocol posted: 08/13/2019   SPECIMEN     Procedure:    Excision     Procedure:    Lymphadenectomy, regional nodes: Right level 1, 2A, 2B, 3, 4, 5     Specimen Laterality:    Right   TUMOR     Tumor Site:    Skin of lip     Tumor Size:    Greatest Dimension (Centimeters): 2.2 cm     Tumor Extent:    Invades muscle     Tumor Thickness (Millimeters):    15 mm     Lymphovascular Invasion:    Present     Tumor-Infiltrating Lymphocytes:    Not identified  MARGINS     Margin Status:    All margins negative for carcinoma       Closest Peripheral Margin Location(s) to Carcinoma:    Right       Distance from Carcinoma to Closest Peripheral Margin:    1 mm       Closest Deep Margin Location(s) to Carcinoma:    right deep       Distance from Carcinoma to Deep Margin:    1 mm   REGIONAL LYMPH NODES     Regional Lymph Node Status:           :    Tumor present in regional lymph node(s)         Number of Lymph Nodes with Tumor:    19         Size of Largest Nodal Metastatic Deposit:    5 mm         Extranodal Extension:    Present       Total Number of Lymph Nodes Examined (sentinel and non-sentinel):    20       Number of Sentinel Nodes Examined:    Cannot be determined: No specific nodes submitted as sentinel nodes   PATHOLOGIC STAGE CLASSIFICATION (pTNM, AJCC 8th Edition)     Reporting of pT, pN, and (when applicable) pM categories is based on information available to the pathologist at the time the report is issued. As per the AJCC (Chapter 1, 8th Ed.) it is the managing physician's responsibility to establish the final pathologic stage based upon all pertinent information, including but potentially not limited to this pathology report.     pT Category:    pT2     pN Category:    pN1b     PREVIOUS RADIATION THERAPY: Yes, for high-grade DCIS of the right breast (ER/PR negative) s/p lumpectomy and XRT in 2007  [place]:  has a past medical history of Breast cancer ([place]) (1995  and 2007), Cardiomyopathy secondary to chemotherapy ([place]), Hyperlipidemia, Hypertension, Melanoma in situ of back ([place]) (09/2011), Personal history of chemotherapy, Personal history of radiation therapy, and Skin cancer (2013).    PAST SURGICAL HISTORY: Past Surgical History:  Procedure Laterality Date   BREAST LUMPECTOMY Right 12/21/2005   right   MASTECTOMY Left 1995   with breast reconstruction - left   RIGHT/LEFT HEART CATH AND CORONARY ANGIOGRAPHY N/A 04/20/2021   Procedure: RIGHT/LEFT HEART CATH AND CORONARY ANGIOGRAPHY;  Surgeon: Jettie Booze, MD;  Location: [facility];  Service: Cardiovascular;  Laterality: N/A;    FAMILY HISTORY: family history includes Autism in her son; Breast cancer in her daughter; Cancer in her father; Lung cancer in her father.  SOCIAL HISTORY:  reports that she quit smoking about 57 years ago. Her smoking use included cigarettes. She has never used smokeless tobacco. She reports current alcohol use. She reports that she does not use drugs.  ALLERGIES: Patient has no known allergies.  MEDICATIONS:  Current Outpatient Medications  Medication Sig Dispense Refill   albuterol (VENTOLIN HFA) 108 (90 Base) MCG/ACT inhaler Inhale 1-2 puffs into the lungs every 6 (six) hours as needed for shortness of breath or wheezing.     allopurinol (ZYLOPRIM) 300 MG tablet Take 1 tablet (300 mg total) by mouth daily. 60 tablet 6   aspirin EC 81 MG tablet Take 1 tablet (81 mg total) by mouth daily. Swallow whole. 90 tablet 3   atorvastatin (LIPITOR) 80 MG tablet Take 1  tablet (80 mg total) by mouth daily. 90 tablet 3   cholecalciferol (VITAMIN D) 1000 UNITS tablet Take 2,000 Units by mouth in the morning.     dapagliflozin propanediol (FARXIGA) 10 MG TABS tablet Take 1 tablet (10 mg total) by mouth daily before breakfast. 30 tablet 9   furosemide (LASIX) 40 MG tablet Take 1 tablet (40 mg total) by mouth daily as needed. 90 tablet 1   hydroxyurea (HYDREA) 500 MG  capsule Take 500 mg by mouth daily. In the evening. May take with food to minimize GI side effects.     Magnesium 400 MG TABS Take 1 tablet by mouth daily as needed. Only take on days taking furosemide 90 tablet 1   metFORMIN (GLUCOPHAGE) 500 MG tablet Take 0.5 tablets (250 mg total) by mouth in the morning and at bedtime.     metoprolol succinate (TOPROL XL) 25 MG 24 hr tablet Take 0.5 tablets (12.5 mg total) by mouth at bedtime. Please hold this med if systolic BP is 90 or less. 45 tablet 2   potassium chloride SA (KLOR-CON M) 20 MEQ tablet Take 1 tablet (20 mEq total) by mouth daily as needed. Only on days taking furosemide 90 tablet 1   sacubitril-valsartan (ENTRESTO) 24-26 MG Take 1 tablet by mouth 2 (two) times daily. 60 tablet 10   triamcinolone (KENALOG) 0.025 % ointment Apply topically as needed.     No current facility-administered medications for this encounter.    REVIEW OF SYSTEMS:  Notable for that above.   PHYSICAL EXAM:  weight is 96 lb (43.5 kg). Her oral temperature is 97.7 F (36.5 C). Her blood pressure is 78/43 (abnormal) and her pulse is 114 (abnormal). Her respiration is 18 and oxygen saturation is 100%.   General: Alert and oriented, in no acute distress HEENT: Thin appearing.  Head is normocephalic. Extraocular movements are intact.  She is not able to open her mouth very wide (some of this is due to concerns about healing and postop instructions).  Satisfactory healing thus far over lower lip with scabbing.  No sign of mucosal or skin recurrence Neck: Neck is notable for satisfactory healing of right neck dissection scar.  No palpable adenopathy Heart: Regular in rate and rhythm with no murmurs, rubs, or gallops.  No tachycardia observed during exam Chest: Clear to auscultation bilaterally, with no rhonchi, wheezes, or rales. Abdomen: Soft, nontender, nondistended, with no rigidity or guarding. Extremities: No cyanosis or edema. Lymphatics: see Neck Exam Skin: No  concerning lesions. Musculoskeletal: Muscle wasting.  Symmetric strength and muscle tone throughout.  Ambulatory. Neurologic: Cranial nerves II through XII are grossly intact. No obvious focalities. Speech is fluent. Coordination is intact. Psychiatric: Judgment and insight are intact. Affect is appropriate.   ECOG = 2  0 - Asymptomatic (Fully active, able to carry on all predisease activities without restriction)  1 - Symptomatic but completely ambulatory (Restricted in physically strenuous activity but ambulatory and able to carry out work of a light or sedentary nature. For example, light housework, office work)  2 - Symptomatic, <50% in bed during the day (Ambulatory and capable of all self care but unable to carry out any work activities. Up and about more than 50% of waking hours)  3 - Symptomatic, >50% in bed, but not bedbound (Capable of only limited self-care, confined to bed or chair 50% or more of waking hours)  4 - Bedbound (Completely disabled. Cannot carry on any self-care. Totally confined to bed or chair)  5 - Death   Eustace Pen MM, Creech RH, Tormey DC, et al. [phone]). "Toxicity and response criteria of the [facility]". [place] Oncol. 5 (6): 649-55   LABORATORY DATA:  Lab Results  Component Value Date   WBC 104.4 (HH) 10/18/2021   HGB 7.0 (L) 10/18/2021   HCT 20.3 (L) 10/18/2021   MCV 97.6 10/18/2021   PLT 61 (L) 10/18/2021   CMP     Component Value Date/Time   NA 134 (L) 10/18/2021 1315   NA 138 04/25/2021 1018   NA 139 11/30/2015 1225   K 4.2 10/18/2021 1315   K 4.6 11/30/2015 1225   CL 100 10/18/2021 1315   CL 104 01/18/2012 0948   CO2 27 10/18/2021 1315   CO2 27 11/30/2015 1225   GLUCOSE 159 (H) 10/18/2021 1315   GLUCOSE 124 11/30/2015 1225   GLUCOSE 124 (H) 01/18/2012 0948   BUN 25 (H) 10/18/2021 1315   BUN 22 04/25/2021 1018   BUN 13.1 11/30/2015 1225   CREATININE 0.57 10/18/2021 1315   CREATININE 0.7 11/30/2015 1225    CALCIUM 10.0 10/18/2021 1315   CALCIUM 10.0 11/30/2015 1225   PROT 7.6 10/18/2021 1315   PROT 6.5 01/28/2020 1315   PROT 7.4 11/30/2015 1225   ALBUMIN 4.0 10/18/2021 1315   ALBUMIN 4.2 01/28/2020 1315   ALBUMIN 3.8 11/30/2015 1225   AST 18 10/18/2021 1315   AST 25 11/30/2015 1225   ALT 19 10/18/2021 1315   ALT 27 11/30/2015 1225   ALKPHOS 141 (H) 10/18/2021 1315   ALKPHOS 83 11/30/2015 1225   BILITOT 0.5 10/18/2021 1315   BILITOT 0.60 11/30/2015 1225   GFRNONAA >60 10/18/2021 1315   GFRAA 104 01/28/2020 1315     No results found for: "TSH"     RADIOGRAPHY: As above.  I personally reviewed her images    IMPRESSION/PLAN:  This is a delightful patient with head and neck cancer.  She has a very aggressive form of Merkel cell carcinoma.  Locally and regionally aggressive.  No proof of distant metastatic disease though she has multiple pulmonary nodules that will need to be observed closely.  We discussed the role of adjuvant radiotherapy to the lip and neck.   Given how frail she is I will hypofractionated this over 4 weeks.  We talked about standard fractionation as well but the patient and her husband and I agree that hypofractionated radiation therapy would be in her best interest as it will probably cause less acute side effects.  She is struggling nutritionally and also wants to be in adequate shape to take immunotherapy after radiation.   We discussed the potential risks, benefits, and side effects of radiotherapy. We talked in detail about acute and late effects. We discussed that some of the most bothersome acute effects may be mucositis, dysgeusia, salivary changes, skin irritation, hair loss, dehydration, weight loss and fatigue. We talked about late effects which include but are not necessarily limited to dysphagia, hypothyroidism, nerve injury, vascular injury, spinal cord injury, xerostomia, trismus, neck edema, and potential injury to any of the tissues in the head and neck  region.  It is possible she will need a feeding tube if she struggles more nutritionally.  No guarantees of treatment were given. A consent form was signed and placed in the patient's medical record. The patient is enthusiastic about proceeding with treatment. I look forward to participating in the patient's care.    Simulation (treatment planning) will take place after  she is cleared by dentistry  We also discussed that the treatment of head and neck cancer is a multidisciplinary process to maximize treatment outcomes and quality of life. For this reason the following referrals have been or will be made:   Medical oncology to discuss chemotherapy    Dentistry for dental evaluation, possible extractions in the radiation fields, and /or advice on reducing risk of cavities, osteoradionecrosis, or other oral issues.   Nutritionist for nutrition support during and after treatment.   Speech language pathology for swallowing and/or speech therapy.   Social work for social support.    Physical therapy due to risk of lymphedema in neck and deconditioning.   Baseline labs including TSH.  On date of service, in total, I spent 65 minutes on this encounter. Patient was seen in person.  __________________________________________   Eppie Gibson, MD  This document serves as a record of services personally performed by Eppie Gibson, MD. It was created on her behalf by Roney Mans, a trained medical scribe. The creation of this record is based on the scribe's personal observations and the provider's statements to them. This document has been checked and approved by the attending provider.

## 2021-10-20 NOTE — Progress Notes (Signed)
Oncology Nurse Navigator Documentation   Placed introductory call to new referral patient Tanya Mitchell. Introduced myself as the H&N oncology nurse navigator that works with Dr. Isidore Moos and Dr. Chryl Heck to whom she has been referred by Dr. Nicolette Bang.  She confirmed understanding of referral. Briefly explained my role as her navigator, provided my contact information.  Confirmed understanding of upcoming appts and [place] location, explained arrival and registration process. I encouraged her to call with questions/concerns as she moves forward with appts and procedures.   She verbalized understanding of information provided, expressed appreciation for my call.   Navigator Initial Assessment Employment Status: she is retired Currently on [employer] / STD: na Living Situation: She lives with her husband.  Support System: spouse, family PCP:  PCD: Financial Concerns:no Transportation Needs: no Sensory Deficits: Engineer, building services Needed:  no Ambulation Needs: no DME Used in Home: no Psychosocial Needs:  no Concerns/Needs Understanding Cancer:  addressed/answered by navigator to best of ability Self-Expressed Needs: no   Clinical biochemist, BSN, OCN Head & Neck Oncology Nurse [place] at [facility] Phone # [phone]  Fax # [phone]

## 2021-10-21 ENCOUNTER — Other Ambulatory Visit: Payer: Self-pay

## 2021-10-21 ENCOUNTER — Ambulatory Visit
Admission: RE | Admit: 2021-10-21 | Discharge: 2021-10-21 | Disposition: A | Discharge status: Home / Self Care | Payer: Medicare Other | Source: Ambulatory Visit | Attending: Radiation Oncology | Admitting: Radiation Oncology

## 2021-10-21 ENCOUNTER — Encounter: Payer: Self-pay | Admitting: Radiation Oncology

## 2021-10-21 VITALS — BP 78/43 | HR 114 | Temp 97.7°F | Resp 18 | Wt 96.0 lb

## 2021-10-21 DIAGNOSIS — Z923 Personal history of irradiation: Secondary | ICD-10-CM | POA: Insufficient documentation

## 2021-10-21 DIAGNOSIS — Z85820 Personal history of malignant melanoma of skin: Secondary | ICD-10-CM | POA: Diagnosis not present

## 2021-10-21 DIAGNOSIS — C4A0 Merkel cell carcinoma of lip: Secondary | ICD-10-CM

## 2021-10-21 DIAGNOSIS — Z85828 Personal history of other malignant neoplasm of skin: Secondary | ICD-10-CM | POA: Diagnosis not present

## 2021-10-21 DIAGNOSIS — Z7984 Long term (current) use of oral hypoglycemic drugs: Secondary | ICD-10-CM | POA: Diagnosis not present

## 2021-10-21 DIAGNOSIS — Z79899 Other long term (current) drug therapy: Secondary | ICD-10-CM | POA: Insufficient documentation

## 2021-10-21 DIAGNOSIS — Z87891 Personal history of nicotine dependence: Secondary | ICD-10-CM | POA: Diagnosis not present

## 2021-10-21 DIAGNOSIS — C770 Secondary and unspecified malignant neoplasm of lymph nodes of head, face and neck: Secondary | ICD-10-CM

## 2021-10-21 DIAGNOSIS — I10 Essential (primary) hypertension: Secondary | ICD-10-CM | POA: Diagnosis not present

## 2021-10-21 DIAGNOSIS — I427 Cardiomyopathy due to drug and external agent: Secondary | ICD-10-CM | POA: Diagnosis not present

## 2021-10-21 DIAGNOSIS — Z7982 Long term (current) use of aspirin: Secondary | ICD-10-CM | POA: Diagnosis not present

## 2021-10-21 DIAGNOSIS — F1010 Alcohol abuse, uncomplicated: Secondary | ICD-10-CM | POA: Diagnosis not present

## 2021-10-21 DIAGNOSIS — C7B1 Secondary Merkel cell carcinoma: Secondary | ICD-10-CM

## 2021-10-21 DIAGNOSIS — E785 Hyperlipidemia, unspecified: Secondary | ICD-10-CM | POA: Insufficient documentation

## 2021-10-21 NOTE — Progress Notes (Signed)
Oncology Nurse Navigator Documentation   Met with patient during initial consult with Dr. Isidore Moos. She was accompanied by her husband.  Further introduced myself as her/their Navigator, explained my role as a member of the Care Team. Provided New Patient Information packet: Contact information for physician, this navigator, other members of the Care Team Advance Directive information ([place] blue pamphlet with LCSW insert); provided [facility] AD booklet at his request,  Fall Prevention Patient [place] Information sheet Symptom Management Clinic information [facility] map with highlight of [place] SLP Information sheet Provided and discussed educational handouts for PEG and PAC. Assisted with post-consult appt scheduling. I explained the location of Dr. Raynelle Dick office and [facility] as reference for future appts, including arrival procedure for these appts. They are aware that they will receive a call from Dr. Raynelle Dick office to schedule an appointment.   They verbalized understanding of information provided. I encouraged them to call with questions/concerns moving forward.  Harlow Asa, RN, BSN, OCN Head & Neck Oncology Nurse [place] at [place] [phone]

## 2021-10-21 NOTE — Progress Notes (Signed)
Head and Neck Cancer Location of Tumor / Histology:  Merkel cell carcinoma of the lip, metastatic to the lymph nodes of the neck  Patient presented with symptoms of: (per Dr. Victorio Palm 09/14/21 consult note): "About 3-4 months ago she noted a new right lower lip lesion. There is no pain. Also with some right facial swelling and enlarged lymph nodes in the right neck. Biopsy of the lip showed Merkel cell cancer. PET showed numerous right neck nodes"  Biopsies revealed:  09/29/2021 Final Pathologic Diagnosis   A. LOWER LIP, EXCISION:  - Ulcerated high grade neuroendocrine carcinoma (2.2 cm) consistent with Merkel cell carcinoma - Tumor is 3m from right margin of resection - See Synoptic report and Comment B. SUBMENTAL CONTENTS, DISSECTION:  - 2 out of 2 lymph nodes are involved by carcinoma with extranodal extension (2/2) . - Largest tumor deposit is 0.9cm C. RIGHT SUBMANDIBULAR CONTENTS, DISSECTION:  - Metastatic carcinoma (5cm) with extranodal extension (1/1). - Submandibular gland not involved by tumor D. RIGHT NECK CONTENTS  LEVELS 2A, 3, 4, AND 5, DISSECTION:  - Metastatic carcinoma with extranodal extension (16/16)  - Largest deposit is 3.5cm - Tumor shows focal squamous differentiation E. RIGHT NECK CONTENTS  LEVEL 2B, DISSECTION:  - Negative for metastatic malignancy (0/1).  Nutrition Status Yes No Comments  Weight changes? '[x]'$  '[]'$  Continues to experience unintentional weight loss; now it seems to be more related to difficulty eating after surgery to her lip  Swallowing concerns? '[]'$  '[x]'$  Denies trouble swallowing, but does have difficulty eating with incision to her lip/chin  PEG? '[]'$  '[x]'$     Referrals Yes No Comments  Social Work? '[x]'$  '[]'$    Dentistry? '[x]'$  '[]'$    Swallowing therapy? '[x]'$  '[]'$    Nutrition? '[x]'$  '[]'$    Med/Onc? '[x]'$  '[]'$     Safety Issues Yes No Comments  Prior radiation? '[x]'$  '[]'$  Right breast 2007  Pacemaker/ICD? '[]'$  '[x]'$    Possible current pregnancy? '[]'$  '[x]'$  Postmenopausal   Is the patient on methotrexate? '[]'$  '[x]'$     Tobacco/Marijuana/Snuff/ETOH use: Patient is a former smoker (quit 1966). Typically drinks about 1 glass of wine per night. Denies any recreational drug use  Past/Anticipated interventions by otolaryngology, if any:  10/12/2021 --Dr. JFrancina Ames(office visit) We have discussed her case in Tumor Board and have recommended referrals to radiation oncology ([facility] or [facility].  Consider systemic therapy, will refer to medical oncology.  Will need dentistry evaluation  Wound care measures were taught to the patient.  I will see her back in 3 weeks.  09/29/2021 --Dr. JFrancina AmesResection of lower lip malignancy Right neck dissection, levels 1-5  Past/Anticipated interventions by medical oncology, if any:  Under care of Dr. PArletha PiliIruku 10/18/2021 --PLAN: She is currently taking Hydrea and darbepoetin as instructed.  She is on hydrea 500 mg daily, darbo every 2 weeks. Labs today with stable leukocytosis.   We will arrange for 1 unit of packed red blood cell transfusion since she feels very fatigued. Now with regards to newly diagnosed Merkel cell status post complete resection, we have discussed about considering adjuvant immunotherapy. I believe she is a very high risk for recurrence or for advanced metastatic disease hence have strongly recommended considering adjuvant immunotherapy.   She will proceed with adjuvant radiation and then will start adjuvant immunotherapy with nivolumab for 480 mg every 4 weeks  Current Complaints / other details:   Hx Left Breast Cancer 1995 (s/p left mastectomy) Hx Right Breast Cancer 2007 Hx Melanoma, Stage IIIA MPN with leukocytosis/thrombocytosis

## 2021-10-25 ENCOUNTER — Telehealth: Payer: Self-pay

## 2021-10-25 ENCOUNTER — Other Ambulatory Visit: Payer: Self-pay | Admitting: *Deleted

## 2021-10-25 ENCOUNTER — Inpatient Hospital Stay: Payer: Medicare Other

## 2021-10-25 ENCOUNTER — Ambulatory Visit (HOSPITAL_COMMUNITY): Payer: Medicare Other | Attending: Cardiology

## 2021-10-25 ENCOUNTER — Other Ambulatory Visit: Payer: Self-pay

## 2021-10-25 DIAGNOSIS — Z79899 Other long term (current) drug therapy: Secondary | ICD-10-CM

## 2021-10-25 DIAGNOSIS — I5022 Chronic systolic (congestive) heart failure: Secondary | ICD-10-CM

## 2021-10-25 DIAGNOSIS — C4A0 Merkel cell carcinoma of lip: Secondary | ICD-10-CM

## 2021-10-25 DIAGNOSIS — D649 Anemia, unspecified: Secondary | ICD-10-CM

## 2021-10-25 DIAGNOSIS — I5023 Acute on chronic systolic (congestive) heart failure: Secondary | ICD-10-CM | POA: Diagnosis not present

## 2021-10-25 DIAGNOSIS — I428 Other cardiomyopathies: Secondary | ICD-10-CM | POA: Diagnosis not present

## 2021-10-25 DIAGNOSIS — D469 Myelodysplastic syndrome, unspecified: Secondary | ICD-10-CM

## 2021-10-25 DIAGNOSIS — D46Z Other myelodysplastic syndromes: Secondary | ICD-10-CM | POA: Diagnosis not present

## 2021-10-25 LAB — CBC WITH DIFFERENTIAL (CANCER CENTER ONLY)
Abs Immature Granulocytes: 44.32 10*3/uL — ABNORMAL HIGH (ref 0.00–0.07)
Basophils Absolute: 0.1 10*3/uL (ref 0.0–0.1)
Basophils Relative: 0 %
Eosinophils Absolute: 0 10*3/uL (ref 0.0–0.5)
Eosinophils Relative: 0 %
HCT: 21.4 % — ABNORMAL LOW (ref 36.0–46.0)
Hemoglobin: 7.4 g/dL — ABNORMAL LOW (ref 12.0–15.0)
Immature Granulocytes: 26 %
Lymphocytes Relative: 3 %
Lymphs Abs: 4.7 10*3/uL — ABNORMAL HIGH (ref 0.7–4.0)
MCH: 34.4 pg — ABNORMAL HIGH (ref 26.0–34.0)
MCHC: 34.6 g/dL (ref 30.0–36.0)
MCV: 99.5 fL (ref 80.0–100.0)
Monocytes Absolute: 2.5 10*3/uL — ABNORMAL HIGH (ref 0.1–1.0)
Monocytes Relative: 1 %
Neutro Abs: 119.8 10*3/uL — ABNORMAL HIGH (ref 1.7–7.7)
Neutrophils Relative %: 70 %
Platelet Count: 58 10*3/uL — ABNORMAL LOW (ref 150–400)
RBC: 2.15 MIL/uL — ABNORMAL LOW (ref 3.87–5.11)
RDW: 21.2 % — ABNORMAL HIGH (ref 11.5–15.5)
Smear Review: NORMAL
WBC Count: 171.4 10*3/uL (ref 4.0–10.5)
nRBC: 0 % (ref 0.0–0.2)

## 2021-10-25 LAB — CMP (CANCER CENTER ONLY)
ALT: 33 U/L (ref 0–44)
AST: 28 U/L (ref 15–41)
Albumin: 4 g/dL (ref 3.5–5.0)
Alkaline Phosphatase: 136 U/L — ABNORMAL HIGH (ref 38–126)
Anion gap: 9 (ref 5–15)
BUN: 39 mg/dL — ABNORMAL HIGH (ref 8–23)
CO2: 27 mmol/L (ref 22–32)
Calcium: 9.8 mg/dL (ref 8.9–10.3)
Chloride: 99 mmol/L (ref 98–111)
Creatinine: 0.7 mg/dL (ref 0.44–1.00)
GFR, Estimated: >60 mL/min (ref >60)
Glucose, Bld: 187 mg/dL — ABNORMAL HIGH (ref 70–99)
Potassium: 4.5 mmol/L (ref 3.5–5.1)
Sodium: 135 mmol/L (ref 135–145)
Total Bilirubin: 0.4 mg/dL (ref 0.3–1.2)
Total Protein: 7.3 g/dL (ref 6.5–8.1)

## 2021-10-25 LAB — ECHOCARDIOGRAM COMPLETE
Area-P 1/2: 3.34 cm2
Calc EF: 44.3 %
S' Lateral: 3.7 cm
Single Plane A2C EF: 44.6 %
Single Plane A4C EF: 39.9 %

## 2021-10-25 LAB — PREPARE RBC (CROSSMATCH)

## 2021-10-25 LAB — TSH: TSH: 2.816 u[IU]/mL (ref 0.350–4.500)

## 2021-10-25 LAB — SAMPLE TO BLOOD BANK

## 2021-10-25 MED ORDER — ACETAMINOPHEN 325 MG PO TABS
650.0000 mg | ORAL_TABLET | Freq: Once | ORAL | Status: AC
  Administered 2021-10-25: 650 mg via ORAL
  Filled 2021-10-25: qty 2

## 2021-10-25 MED ORDER — SODIUM CHLORIDE 0.9% IV SOLUTION
250.0000 mL | Freq: Once | INTRAVENOUS | Status: AC
  Administered 2021-10-25: 250 mL via INTRAVENOUS

## 2021-10-25 NOTE — Telephone Encounter (Signed)
CRITICAL VALUE STICKER  CRITICAL VALUE: WBC = 171.4, Hgb = 7.4 and Platelets = 58  RECEIVER (on-site recipient of call): Yetta Glassman, CMA   DATE & TIME NOTIFIED: 10/25/21 at 12:52pm  MESSENGER (representative from lab): Rosann Auerbach  MD NOTIFIED: Chryl Heck  TIME OF NOTIFICATION: 10/25/21 at 12:57pm  RESPONSE: Notification provided to Bethann Punches., RN and Dr. Chryl Heck for follow-up with the pt.

## 2021-10-25 NOTE — Patient Instructions (Signed)

## 2021-10-26 LAB — TYPE AND SCREEN
ABO/RH(D): AB NEG
Antibody Screen: NEGATIVE
Unit division: 0

## 2021-10-26 LAB — BPAM RBC
Blood Product Expiration Date: 202310092359
ISSUE DATE / TIME: 202309121439
Unit Type and Rh: 600

## 2021-10-27 ENCOUNTER — Other Ambulatory Visit: Payer: Self-pay

## 2021-10-27 DIAGNOSIS — C4A0 Merkel cell carcinoma of lip: Secondary | ICD-10-CM

## 2021-10-27 NOTE — Progress Notes (Deleted)
Cardiology Office Note:    Date:  10/27/2021   ID:  Tanya Mitchell, DOB 02-Jan-1946, MRN [id number]  PCP:  Tanya Pike, MD   [facility] Providers Cardiologist:  None {  Referring MD: Tanya Pike, MD   History of Present Illness:    Tanya Mitchell is a 76 y.o. female with a hx of prior cardiomyopathy secondary to chemo from breast cancer with improved EF, melenoma, HLD and DMII who was previously followed by Tanya Merle, NP who now presents to clinic for follow-up.  Seen in clinic 08/2020 where she was doing well with no anginal or HF symptoms. TTE 09/2020 with LVEF 45-50%, normal RV, normal PASP, severe LAE, trivial MR, no significant aortic disease.  Was seen in clinic on 03/29/21 as an urgent visit for worsening edema, weight gain, fatigue and abdominal distension. TTE with LVEF 25-30%, mild LV dilation, G2DD, mild RV dysfunction, severe LAE, mild MR, moderate pleural effusion and small pericardial effusion. We discussed hospitalization but she wanted to trial out-patient management. We started her on lasix '40mg'$  BID x5 days and then daily thereafter. We held her coreg and started spiro.   She saw Tanya Mitchell in follow-up on 03/2021 where she had lost 30lbs. Symptoms had improved significantly and she felt back to baseline. She was resumed on coreg 6.'25mg'$  BID and her lasix was changed to prn.  She underwent RHC and LHC on 04/2021  which showed no significant CAD. RHC with Ao sat 100%, PA sat 71%, PA pressure 23/3, mean PA pressure 11 mm Hg; mean PCWP 3 mm Hg; CO 5.7 L/min; CI 3.88.   Was last seen in clinic 07/2021 where she was feeling well. Blood pressure had been soft and her coreg was changed to metop and spiro was held. Repeat TTE 10/2021 with EF improved to 40-45%, G1DD, normal EF, normal PASP, no significant valve disease.  Today, ***  Past Medical History:  Diagnosis Date   Breast cancer ([place]) 1995 and 2007   Cardiomyopathy secondary to chemotherapy  [facility])    has improved    Hyperlipidemia    Hypertension    under control, has been on med. x 3 yrs.   Melanoma in situ of back ([place]) 09/2011   left   Personal history of chemotherapy    Personal history of radiation therapy    Skin cancer 2013   Melanoma    Past Surgical History:  Procedure Laterality Date   BREAST LUMPECTOMY Right 12/21/2005   right   MASTECTOMY Left 1995   with breast reconstruction - left   RIGHT/LEFT HEART CATH AND CORONARY ANGIOGRAPHY N/A 04/20/2021   Procedure: RIGHT/LEFT HEART CATH AND CORONARY ANGIOGRAPHY;  Surgeon: Tanya Booze, MD;  Location: [place] [facility];  Service: Cardiovascular;  Laterality: N/A;    Current Medications: No outpatient medications have been marked as taking for the 11/07/21 encounter (Appointment) with Tanya Bergeron, MD.     Allergies:   Patient has no known allergies.   Social History   Socioeconomic History   Marital status: Married    Spouse name: Not on file   Number of children: Not on file   Years of education: Not on file   Highest education level: Not on file  Occupational History   Not on file  Tobacco Use   Smoking status: Former    Types: Cigarettes    Quit date: 02/14/1964    Years since quitting: 57.7   Smokeless tobacco: Never  Vaping Use  Vaping Use: Never used  Substance and Sexual Activity   Alcohol use: Yes    Comment: daily glass of wine   Drug use: No   Sexual activity: Yes    Birth control/protection: Post-menopausal  Other Topics Concern   Not on file  Social History Narrative   Not on file   Social Determinants of Health   Financial Resource Strain: Not on file  Food Insecurity: Not on file  Transportation Needs: Not on file  Physical Activity: Not on file  Stress: Not on file  Social Connections: Not on file     Family History: The patient's family history includes Autism in her son; Breast cancer in her daughter; Cancer in her father; Lung cancer in her  father.  ROS:   Please see the history of present illness.    Review of Systems  Constitutional:  Negative for chills, fever and malaise/fatigue.  HENT:  Negative for hearing loss.   Eyes:  Negative for blurred vision.  Respiratory:  Negative for shortness of breath.   Cardiovascular:  Negative for chest pain, palpitations, orthopnea, claudication, leg swelling and PND.  Gastrointestinal:  Negative for melena, nausea and vomiting.  Genitourinary:  Negative for hematuria.  Musculoskeletal:  Negative for falls.  Neurological:  Negative for dizziness and loss of consciousness.  Psychiatric/Behavioral:  Negative for substance abuse.      EKGs/Labs/Other Studies Reviewed:    The following studies were reviewed today: TTE 11/13/21: IMPRESSIONS     1. Left ventricular ejection fraction, by estimation, is 40 to 45%. The  left ventricle has mildly decreased function. The left ventricle has no  regional wall motion abnormalities. Left ventricular diastolic parameters  are consistent with Grade I  diastolic dysfunction (impaired relaxation).   2. Right ventricular systolic function is normal. The right ventricular  size is normal. There is normal pulmonary artery systolic pressure. The  estimated right ventricular systolic pressure is 60.1 mmHg.   3. The mitral valve is normal in structure. No evidence of mitral valve  regurgitation. No evidence of mitral stenosis.   4. The aortic valve is normal in structure. Aortic valve regurgitation is  not visualized. No aortic stenosis is present.   5. The inferior vena cava is normal in size with greater than 50%  respiratory variability, suggesting right atrial pressure of 3 mmHg.   Comparison(s): Prior EF 25-30%.  RHC/LHC 04/20/21:   There is moderate left ventricular systolic dysfunction.   LV end diastolic pressure is normal.   The left ventricular ejection fraction is 35-45% by visual estimate.   There is no aortic valve stenosis.   No  angiographically apparent coronary artery disease.   Ao sat 100%, PA sat 71%, PA pressure 23/3, mean PA pressure 11 mm Hg; mean PCWP 3 mm Hg; CO 5.7 L/min; CI 3.88   Nonischemic cardiomyopathy.  Normal hemodynamics.  Continue medical therapy.   TTE 03/29/21: 1. Compared to 09/30/20 LV function is worse.   2. Left ventricular ejection fraction, by estimation, is 25 to 30%. The  left ventricle has severely decreased function. The left ventricle  demonstrates global hypokinesis. The left ventricular internal cavity size  was mildly dilated. Left ventricular  diastolic parameters are consistent with Grade II diastolic dysfunction  (pseudonormalization). Elevated left atrial pressure.   3. Right ventricular systolic function is mildly reduced. The right  ventricular size is normal.   4. Left atrial size was severely dilated.   5. A small pericardial effusion is present. Moderate pleural effusion  in  the left lateral region.   6. The mitral valve is normal in structure. Mild mitral valve  regurgitation. No evidence of mitral stenosis.   7. The aortic valve is tricuspid. Aortic valve regurgitation is not  visualized. Aortic valve sclerosis is present, with no evidence of aortic  valve stenosis.   8. The inferior vena cava is normal in size with greater than 50%  respiratory variability, suggesting right atrial pressure of 3 mmHg.   Comparison(s): Prior Ejection Fraction 45-50%.  TTE 11/13/18: IMPRESSIONS   1. Left ventricular ejection fraction, by visual estimation, is 45 to  50%. The left ventricle has mildly decreased function. Normal left  ventricular size. There is no left ventricular hypertrophy.   2. Elevated left ventricular end-diastolic pressure.   3. Left ventricular diastolic Doppler parameters are consistent with  impaired relaxation pattern of LV diastolic filling.   4. Global longitudinal strain abnormal (-11.5%). Very slightly worse than  10/2017.   5. Global right ventricle  has normal systolic function.The right  ventricular size is normal. No increase in right ventricular wall  thickness.   6. Left atrial size was moderately dilated.   7. Right atrial size was normal.   8. The mitral valve is normal in structure. Trace mitral valve  regurgitation. No evidence of mitral stenosis.   9. The tricuspid valve is normal in structure. Tricuspid valve  regurgitation is mild.  10. The aortic valve is normal in structure. Aortic valve regurgitation  was not visualized by color flow Doppler. Structurally normal aortic  valve, with no evidence of sclerosis or stenosis.  11. The pulmonic valve was normal in structure. Pulmonic valve  regurgitation is trivial by color flow Doppler.  12. Mildly elevated pulmonary artery systolic pressure.  13. The inferior vena cava is normal in size with greater than 50%  respiratory variability, suggesting right atrial pressure of 3 mmHg.    EKG:  No new tracing today  Recent Labs: 03/29/2021: NT-Pro BNP 8,726 04/04/2021: Magnesium 2.0 10/25/2021: ALT 33; BUN 39; Creatinine 0.70; Hemoglobin 7.4; Platelet Count 58; Potassium 4.5; Sodium 135; TSH 2.816   Recent Lipid Panel    Component Value Date/Time   CHOL 126 01/28/2020 1315   TRIG 190 (H) 01/28/2020 1315   HDL 29 (L) 01/28/2020 1315   CHOLHDL 4.3 01/28/2020 1315   LDLCALC 65 01/28/2020 1315     Risk Assessment/Calculations:           Physical Exam:    VS:  There were no vitals taken for this visit.    Wt Readings from Last 3 Encounters:  10/21/21 96 lb (43.5 kg)  10/18/21 96 lb 12.8 oz (43.9 kg)  07/28/21 105 lb 6.4 oz (47.8 kg)     GEN:  Comfortable, sitting comfortably in a chair HEENT: Normal NECK: No JVD CARDIAC: RRR, 2/6 systolic murmur. No rubs, gallops RESPIRATORY:  CTAB ABDOMEN: Soft, non-tender, non-distended MUSCULOSKELETAL:  No LE edema, warm SKIN: Warm and dry NEUROLOGIC:  Alert and oriented x 3 PSYCHIATRIC:  Normal affect   ASSESSMENT:     No diagnosis found.   PLAN:    In order of problems listed above:  #Chronic Systolic Heart Failure  #Nonischemic CM: #History of chemotherapy induced CM: Patient with history of chemo induced CM with improved EF to 45-50% on multiple subsequent TTEs. Was doing well until about 01/2021 when she developed progressive fatigue, LE edema and abdominal distension. Saw her oncologist who ordered TTE on 03/29/21 which showed EF 25% global  hypokinesis, mild-to-moderate MR, severe LAE, RV mildly enlarged, elevated filling pressures, RAP 38mHg, small pericardial effusion. Responded very well to outpatient diuresis and GDMT. [place]/2023 with normal filling pressures. Cath with no obstructive disease.  Repeat TTE 10/2021 with improved EF to 40-45%.  -Cath with no obstructive CAD -Continue lasix '40mg'$  prn for weight gain -Continue metop 12.'5mg'$  XL daily  -Continue entresto 24/'26mg'$  BID -Stopped spiro due to hypotension  -Continue farxiga '10mg'$  daily -Low Na diet  #HTN: Now with low blood pressures in 80-90s at home. Relatively asymptomatic. -Continue metop 12.'5mg'$  XL daily  -Continue entresto 24/'26mg'$  BID -Stopped spiro due to hypotension   #HLD: LDL 51 09/15/20. -Continue lipitor '80mg'$  daily  #History of Breast Cancer in remission History of chemo with anthracycline and XRT. Had depressed LVEF but recovered to 45-50%, now back down to 25-30% as detailed above. -Management acute on chronic HF as above  #Leukocytosis: #Thrombocytosis: -On hydroxyurea  Follow-up:   3 months.  Medication Adjustments/Labs and Tests Ordered: Current medicines are reviewed at length with the patient today.  Concerns regarding medicines are outlined above.   No orders of the defined types were placed in this encounter.  No orders of the defined types were placed in this encounter.  There are no Patient Instructions on file for this visit.  Signed, HFreada Bergeron MD  10/27/2021 8:10 PM    [place]

## 2021-10-28 ENCOUNTER — Ambulatory Visit
Admission: RE | Admit: 2021-10-28 | Discharge: 2021-10-28 | Disposition: A | Discharge status: Home / Self Care | Payer: Medicare Other | Source: Ambulatory Visit | Attending: Radiation Oncology | Admitting: Radiation Oncology

## 2021-10-28 VITALS — BP 104/57 | HR 110 | Temp 97.6°F | Wt 96.4 lb

## 2021-10-28 DIAGNOSIS — Z510 Encounter for antineoplastic radiation therapy: Secondary | ICD-10-CM | POA: Diagnosis not present

## 2021-10-28 DIAGNOSIS — C4A0 Merkel cell carcinoma of lip: Secondary | ICD-10-CM | POA: Diagnosis not present

## 2021-10-28 MED ORDER — SODIUM CHLORIDE 0.9% FLUSH
10.0000 mL | Freq: Once | INTRAVENOUS | Status: AC
  Administered 2021-10-28: 10 mL via INTRAVENOUS

## 2021-10-28 NOTE — Progress Notes (Signed)
Oncology Nurse Navigator Documentation   To provide support, encouragement and care continuity, met with Tanya Mitchell before and after her CT SIM.  She tolerated procedure without difficulty, denied questions/concerns.   She agreed to come to head and neck [facility] on 11/03/21 for meet with SLP and PT. I encouraged her to call me prior to 11/08/21 [facility].   Harlow Asa RN, BSN, OCN Head & Neck Oncology Nurse [place] [facility] Phone # [phone]  Fax # [phone]

## 2021-10-28 NOTE — Progress Notes (Signed)
Has armband been applied?  No.  Does patient have an allergy to IV contrast dye?: No.   Has patient ever received premedication for IV contrast dye?: No.   Date of lab work: October 25, 2021 BUN: 39 CR: 0.70 eGFR: >60  Does patient take metformin?: Yes.   Is eGFR >60?: Yes.   If no, when can patient resume? (Must be 48 hrs AFTER they receive IV contrast):  N/A  IV site: hand right, condition patent and no redness  Has IV site been added to flowsheet?  Yes.    BP (!) 104/57 (Patient Position: Sitting) Comment: nurse notified  Pulse (!) 110 Comment: nurse notified  Temp 97.6 F (36.4 C) (Oral)   Wt 96 lb 6.4 oz (43.7 kg)   BMI 17.63 kg/m

## 2021-10-31 ENCOUNTER — Ambulatory Visit (INDEPENDENT_AMBULATORY_CARE_PROVIDER_SITE_OTHER): Payer: Medicare Other | Admitting: Dentistry

## 2021-10-31 ENCOUNTER — Inpatient Hospital Stay: Payer: Medicare Other

## 2021-10-31 ENCOUNTER — Encounter (HOSPITAL_COMMUNITY): Payer: Self-pay | Admitting: Dentistry

## 2021-10-31 VITALS — BP 100/57 | HR 111 | Temp 98.2°F

## 2021-10-31 VITALS — BP 99/45 | HR 94 | Temp 98.0°F

## 2021-10-31 DIAGNOSIS — D469 Myelodysplastic syndrome, unspecified: Secondary | ICD-10-CM

## 2021-10-31 DIAGNOSIS — Z01818 Encounter for other preprocedural examination: Secondary | ICD-10-CM

## 2021-10-31 DIAGNOSIS — R252 Cramp and spasm: Secondary | ICD-10-CM

## 2021-10-31 DIAGNOSIS — C4A0 Merkel cell carcinoma of lip: Secondary | ICD-10-CM

## 2021-10-31 DIAGNOSIS — D471 Chronic myeloproliferative disease: Secondary | ICD-10-CM

## 2021-10-31 DIAGNOSIS — K030 Excessive attrition of teeth: Secondary | ICD-10-CM | POA: Insufficient documentation

## 2021-10-31 DIAGNOSIS — K06020 Generalized gingival recession, unspecified: Secondary | ICD-10-CM

## 2021-10-31 DIAGNOSIS — K08109 Complete loss of teeth, unspecified cause, unspecified class: Secondary | ICD-10-CM

## 2021-10-31 DIAGNOSIS — Z79899 Other long term (current) drug therapy: Secondary | ICD-10-CM | POA: Diagnosis not present

## 2021-10-31 DIAGNOSIS — K056 Periodontal disease, unspecified: Secondary | ICD-10-CM

## 2021-10-31 DIAGNOSIS — K036 Deposits [accretions] on teeth: Secondary | ICD-10-CM

## 2021-10-31 DIAGNOSIS — D46Z Other myelodysplastic syndromes: Secondary | ICD-10-CM | POA: Diagnosis not present

## 2021-10-31 LAB — CBC WITH DIFFERENTIAL (CANCER CENTER ONLY)
Abs Immature Granulocytes: 29.2 10*3/uL — ABNORMAL HIGH (ref 0.00–0.07)
Band Neutrophils: 14 %
Basophils Absolute: 0 10*3/uL (ref 0.0–0.1)
Basophils Relative: 0 %
Eosinophils Absolute: 0 10*3/uL (ref 0.0–0.5)
Eosinophils Relative: 0 %
HCT: 28 % — ABNORMAL LOW (ref 36.0–46.0)
Hemoglobin: 9.5 g/dL — ABNORMAL LOW (ref 12.0–15.0)
Lymphocytes Relative: 2 %
Lymphs Abs: 2.3 10*3/uL (ref 0.7–4.0)
MCH: 32.9 pg (ref 26.0–34.0)
MCHC: 33.9 g/dL (ref 30.0–36.0)
MCV: 96.9 fL (ref 80.0–100.0)
Metamyelocytes Relative: 5 %
Monocytes Absolute: 0 10*3/uL — ABNORMAL LOW (ref 0.1–1.0)
Monocytes Relative: 0 %
Myelocytes: 20 %
Neutro Abs: 85.1 10*3/uL — ABNORMAL HIGH (ref 1.7–7.7)
Neutrophils Relative %: 59 %
Platelet Count: 54 10*3/uL — ABNORMAL LOW (ref 150–400)
RBC: 2.89 MIL/uL — ABNORMAL LOW (ref 3.87–5.11)
RDW: 20.3 % — ABNORMAL HIGH (ref 11.5–15.5)
Smear Review: NORMAL
WBC Count: 116.6 10*3/uL (ref 4.0–10.5)
nRBC: 0 % (ref 0.0–0.2)

## 2021-10-31 LAB — CMP (CANCER CENTER ONLY)
ALT: 29 U/L (ref 0–44)
AST: 25 U/L (ref 15–41)
Albumin: 4 g/dL (ref 3.5–5.0)
Alkaline Phosphatase: 134 U/L — ABNORMAL HIGH (ref 38–126)
Anion gap: 13 (ref 5–15)
BUN: 27 mg/dL — ABNORMAL HIGH (ref 8–23)
CO2: 25 mmol/L (ref 22–32)
Calcium: 9.5 mg/dL (ref 8.9–10.3)
Chloride: 98 mmol/L (ref 98–111)
Creatinine: 0.7 mg/dL (ref 0.44–1.00)
GFR, Estimated: >60 mL/min (ref >60)
Glucose, Bld: 184 mg/dL — ABNORMAL HIGH (ref 70–99)
Potassium: 4.2 mmol/L (ref 3.5–5.1)
Sodium: 136 mmol/L (ref 135–145)
Total Bilirubin: 0.5 mg/dL (ref 0.3–1.2)
Total Protein: 7.2 g/dL (ref 6.5–8.1)

## 2021-10-31 LAB — SAMPLE TO BLOOD BANK

## 2021-10-31 MED ORDER — DARBEPOETIN ALFA 300 MCG/0.6ML IJ SOSY
300.0000 ug | PREFILLED_SYRINGE | Freq: Once | INTRAMUSCULAR | Status: AC
  Administered 2021-10-31: 300 ug via SUBCUTANEOUS
  Filled 2021-10-31: qty 0.6

## 2021-10-31 MED ORDER — SODIUM FLUORIDE 1.1 % DT CREA: TOPICAL_CREAM | DENTAL | 4 refills | Status: AC

## 2021-10-31 NOTE — Patient Instructions (Signed)
[facility] DEPARTMENT OF DENTAL MEDICINE Dr. Bowie Delia B. Dezaria Methot, D.M.D. Phone: [phone] Fax: [phone]        It was a pleasure seeing you today!  Please refer to the information below regarding your dental visit with us.  Please do not hesitate to give us a call if any questions or concerns come up after you leave.    Thank you for letting us provide care for you.  If there is anything we can do for you, please let us know.      RADIATION THERAPY AND INFORMATION REGARDING YOUR TEETH  XEROSTOMIA (dry mouth):  Your salivary glands may be in the field of radiation.  Radiation may include all or only part of your salivary glands.  This will cause your saliva to dry up, and you will have a dry mouth.  The dry mouth will be for the rest of your life unless your radiation oncologist tells you otherwise.   Your saliva has many functions: It wets your tongue for speaking. It coats your teeth and the inside of your mouth for easier movement. It helps with chewing and swallowing food. It helps clean away harmful acid and toxic products made by the germs in your mouth, therefore it helps prevent cavities. It kills some germs in your mouth and helps to prevent gum disease. It helps to carry flavor to your taste buds.  Once you have lost your saliva, you will be at higher risk for tooth decay and gum disease.     What can be done to help improve your mouth when there's not enough saliva? Your dentist may give a recommendation for CLoSYS.  It will not bring back all of your saliva but may bring back some of it.  Also, your saliva may be thick and ropy or white and foamy.  It will not feel like it use to feel. You will need to swish with water every time your mouth feels dry.  YOU CANNOT suck on any cough drops, mints, lemon drops, candy, vitamin C or any other products.  You cannot use anything other than water to make your mouth feel less dry.  If you want to  drink anything else, you have to drink it all at once and brush afterwards.  Be sure to discuss the details of your diet habits with your dentist or hygienist.   RADIATION CARIES:  This is decay (cavities) that happens very quickly  once your mouth is very dry due to radiation therapy.  Normally, cavities take six months to two years to become a problem.  When you have dry mouth, cavities may take as little as eight weeks to cause you a problem.    Dental check-ups every 2-4 months are necessary as long as you have a dry mouth.  Radiation caries typically, but not always, starts at your gum line where it is hard to see the cavity.  It is therefore also hard to fill these cavities adequately.  This high rate of cavities happens because your mouth no longer has saliva and therefore the acid made by the germs starts the decay process.  Whenever you eat anything the germs in your mouth change the food into acid.  The acid then burns a small hole in your tooth.  This small hole is the beginning of a cavity.  If this is not treated then it will grow bigger and become a cavity.  The way to avoid this hole getting bigger   is to use fluoride every evening as prescribed by your dentist following your radiation. NOTE:  You have to make sure that your teeth are very clean before you use the fluoride.  This fluoride in turn will strengthen your teeth and prepare them for another day of fighting acid. If you develop radiation caries many times, the damage is so large that you will have to have all your teeth removed.  This could be a big problem if some of these teeth are in the field of radiation.  Further details of why this could be a big problem will follow (see Osteoradionecrosis below).   DYSGEUSIA (loss of taste):  This happens to varying degrees once you've had radiation therapy to your jaw region.  Many times taste is not completely lost, but becomes limited.  The loss of taste is mostly due to radiation  affecting your taste buds.  However, if you have no saliva in your mouth to carry the flavor to your taste buds, it would be difficult for your taste buds to taste anything.  That is why using water or over-the-counter brand CLoSYS prior to meals and during meal times may help with some of the taste.  Keep in mind that taste generally returns very slowly over the course of several months or several years after radiation therapy.  Don't give up hope.   TRISMUS (limited jaw opening):  According to your radiation oncologist, your TMJ or jaw joints are going to be partially or fully in the field of radiation.  This means that over time the muscles that help you open and close your mouth may get stiff.  This will potentially result in your not being able to open your mouth wide enough or as wide as you can open it now.  Let me give you an example of how slowly this happens and how unaware people are of it: A gentlemen that had radiation therapy 2 years ago went back to his dentist complaining that "bananas were just too large for him to be able to fit them in between his teeth."  He was no longer able to open wide enough to bite into a banana.  This happened slowly and over a period of time.   What we do to try and prevent this:   Your dentist will probably give you a stack of sticks called a trismus exercise device.  This stack will help remind your muscles and your jaw joints to open up to the same distance every day.  Use these sticks every morning when you wake up, or according to the instructions given by your dentist.    You must use these sticks for at least one to two years after radiation therapy.  The reason for that is because it happens so slowly and keeps going on for about two years after radiation therapy.  Your hospital dentist will help you monitor your mouth opening and make sure that it's not getting smaller after radiation.      TRISMUS EXERCISES: Using the stack of sticks given to you by  your dentist, place the stack in your mouth and hold onto the other end for support. Leave the sticks in your mouth while holding the other end.  Allow 30 seconds for muscle stretching. Rest for a few seconds. Repeat 3-5 times. This exercise is recommended in the mornings and evenings unless otherwise instructed. The exercise should be done for a period of 2 YEARS after the end of radiation. Your maximum   jaw opening should be checked regularly at recall dental visits by your general dentist. You should report any changes, soreness, or difficulties encountered when doing the exercises to your dentist.   OSTEORADIONECROSIS (ORN):  This is a condition where your jaw bone after radiation therapy becomes very dry.  It has very little blood supply to keep it alive.  If you develop a cavity that turns into an abscess or an infection, then the jaw bone does not have enough blood supply to help fight the infection.  At this point it is very likely that the infection could cause the death of your jaw bone.  When you have dead bone it has to be removed.  Therefore, you might end up having to have surgery to remove part of your jaw bone, the part of the jaw bone that has been affected.     Healing is also a problem if you are to have surgery (like a tooth extraction) in the areas where the bone has had radiation therapy.  If you have surgery, you need more blood supply to heal which is not available.  When blood supply and oxygen are not available, there is a chance for the bone to die. Occasionally, ORN happens on its own with no obvious reason, but this is quite rare.  We believe that patients who continue to smoke and/or drink alcohol have a higher chance of having this problem. Once your jaw bone has had radiation therapy, if there are any remaining teeth in that area, it is not recommended to have them pulled unless your dentist or oral surgeon is aware of your history of radiation and believes it is safe.   The risks for ORN either from infection or spontaneously occurring (with no reason) are life long.      Questions?  Call our office during office hours at [phone].  

## 2021-10-31 NOTE — Progress Notes (Signed)
[facility]     OUTPATIENT CONSULTATION   PLAN/RECOMMENDATIONS     ASSESSMENT: There are no current signs of acute odontogenic infection including abscess, edema or erythema, or suspicious lesion requiring biopsy.   Trismus; symptoms with limited opening     RECOMMENDATIONS: No dental intervention indicated prior to radiation at this time.  Start trismus exercises & use prescription fluoride toothpaste as instructed.     PLAN: Discuss case with medical team and coordinate treatment as needed. Follow-up after completion of radiation therapy. Call if any questions or concerns arise before next visit.  Rx:  Sodium fluoride 1.1% cream  Discussed in detail all treatment options and recommendations with the patient and they are agreeable to the plan.   Thank you for consulting with [facility] and for the opportunity to participate in this patient's treatment.  Should you have any questions or concerns, please contact the [facility] at [phone].    Service Date:   10/31/2021  Patient Name:   Tanya Mitchell Date of Birth:   [birth year] Medical Record Number: [id number]  Referring Provider:           Eppie Gibson, MD   HISTORY OF PRESENT ILLNESS: Tanya Mitchell is a very pleasant 76 y.o. female with history of HTN, heart failure, hyperlipidemia, breast cancer (status post chemoradiation therapy) and tobacco use (cigarettes- quit in 1966) who was recently diagnosed with Merkel cell carcinoma of the lip and is anticipating head and neck radiation therapy.   The patient presents today for a medically necessary dental consultation as part of their pre-radiation therapy work-up.  The patient has already had her simulation for radiation.   DENTAL HISTORY: The patient reports that she does have a periodontist that she sees regularly, but not a dentist (she has seen Dr. Almyra Free, [place] in the past, however it has  been a while).  Her periodontist is Dr. Satira Sark who she sees every 3 months. Her last cleaning was in May of this year.  She currently denies any dental/orofacial pain or sensitivity. Patient is able to manage oral secretions.  Patient denies dysphagia, odynophagia, dysphonia, SOB and neck pain.  Patient denies fever, rigors and malaise.   CHIEF COMPLAINT:  Here for a pre-head and neck radiation dental evaluation.  Patient Active Problem List   Diagnosis Date Noted   Merkel cell carcinoma of lip ([place]) 17/00/1749   Acute systolic heart failure ([place])    Myeloproliferative disorder ([place]) 11/09/2020   MDS/MPN (myelodysplastic/myeloproliferative neoplasms) ([place]) 05/25/2020   Thrombocytosis 11/28/2017   Leukocytosis 11/28/2017   Breast neoplasm, Tis (DCIS), right 01/06/2013   Essential hypertension 05/12/2012   Melanoma of back, pT2NxMx 09/22/2011   Nonischemic cardiomyopathy ([place]) 08/26/2010   Past Medical History:  Diagnosis Date   Breast cancer ([place]) 1995 and 2007   Cardiomyopathy secondary to chemotherapy [facility])    has improved    Hyperlipidemia    Hypertension    under control, has been on med. x 3 yrs.   Melanoma in situ of back ([place]) 09/2011   left   Personal history of chemotherapy    Personal history of radiation therapy    Skin cancer 2013   Melanoma   Past Surgical History:  Procedure Laterality Date   BREAST LUMPECTOMY Right 12/21/2005   right   MASTECTOMY Left 1995   with breast reconstruction - left   RIGHT/LEFT HEART CATH AND CORONARY ANGIOGRAPHY N/A 04/20/2021   Procedure: RIGHT/LEFT HEART CATH  AND CORONARY ANGIOGRAPHY;  Surgeon: Jettie Booze, MD;  Location: [facility];  Service: Cardiovascular;  Laterality: N/A;   No Known Allergies Current Outpatient Medications  Medication Sig Dispense Refill   albuterol (VENTOLIN HFA) 108 (90 Base) MCG/ACT inhaler Inhale 1-2 puffs into the lungs every 6 (six) hours as needed for shortness of breath or wheezing.      allopurinol (ZYLOPRIM) 300 MG tablet Take 1 tablet (300 mg total) by mouth daily. 60 tablet 6   aspirin EC 81 MG tablet Take 1 tablet (81 mg total) by mouth daily. Swallow whole. 90 tablet 3   atorvastatin (LIPITOR) 80 MG tablet Take 1 tablet (80 mg total) by mouth daily. 90 tablet 3   cholecalciferol (VITAMIN D) 1000 UNITS tablet Take 2,000 Units by mouth in the morning.     dapagliflozin propanediol (FARXIGA) 10 MG TABS tablet Take 1 tablet (10 mg total) by mouth daily before breakfast. 30 tablet 9   furosemide (LASIX) 40 MG tablet Take 1 tablet (40 mg total) by mouth daily as needed. 90 tablet 1   hydroxyurea (HYDREA) 500 MG capsule Take 500 mg by mouth daily. In the evening. May take with food to minimize GI side effects.     Magnesium 400 MG TABS Take 1 tablet by mouth daily as needed. Only take on days taking furosemide 90 tablet 1   metFORMIN (GLUCOPHAGE) 500 MG tablet Take 0.5 tablets (250 mg total) by mouth in the morning and at bedtime.     metoprolol succinate (TOPROL XL) 25 MG 24 hr tablet Take 0.5 tablets (12.5 mg total) by mouth at bedtime. Please hold this med if systolic BP is 90 or less. 45 tablet 2   potassium chloride SA (KLOR-CON M) 20 MEQ tablet Take 1 tablet (20 mEq total) by mouth daily as needed. Only on days taking furosemide 90 tablet 1   sacubitril-valsartan (ENTRESTO) 24-26 MG Take 1 tablet by mouth 2 (two) times daily. 60 tablet 10   triamcinolone (KENALOG) 0.025 % ointment Apply topically as needed.     No current facility-administered medications for this visit.    LABS:  Lab Results  Component Value Date   WBC 171.4 (HH) 10/25/2021   HGB 7.4 (L) 10/25/2021   HCT 21.4 (L) 10/25/2021   MCV 99.5 10/25/2021   PLT 58 (L) 10/25/2021      Component Value Date/Time   NA 135 10/25/2021 1210   NA 138 04/25/2021 1018   NA 139 11/30/2015 1225   K 4.5 10/25/2021 1210   K 4.6 11/30/2015 1225   CL 99 10/25/2021 1210   CL 104 01/18/2012 0948   CO2 27 10/25/2021  1210   CO2 27 11/30/2015 1225   GLUCOSE 187 (H) 10/25/2021 1210   GLUCOSE 124 11/30/2015 1225   GLUCOSE 124 (H) 01/18/2012 0948   BUN 39 (H) 10/25/2021 1210   BUN 22 04/25/2021 1018   BUN 13.1 11/30/2015 1225   CREATININE 0.70 10/25/2021 1210   CREATININE 0.7 11/30/2015 1225   CALCIUM 9.8 10/25/2021 1210   CALCIUM 10.0 11/30/2015 1225   GFRNONAA >60 10/25/2021 1210   GFRAA 104 01/28/2020 1315   No results found for: "INR", "PROTIME" No results found for: "PTT"  Social History   Socioeconomic History   Marital status: Married    Spouse name: Not on file   Number of children: Not on file   Years of education: Not on file   Highest education level: Not on file  Occupational  History   Not on file  Tobacco Use   Smoking status: Former    Types: Cigarettes    Quit date: 02/14/1964    Years since quitting: 57.7   Smokeless tobacco: Never  Vaping Use   Vaping Use: Never used  Substance and Sexual Activity   Alcohol use: Yes    Comment: daily glass of wine   Drug use: No   Sexual activity: Yes    Birth control/protection: Post-menopausal  Other Topics Concern   Not on file  Social History Narrative   Not on file   Social Determinants of Health   Financial Resource Strain: Not on file  Food Insecurity: Not on file  Transportation Needs: Not on file  Physical Activity: Not on file  Stress: Not on file  Social Connections: Not on file  Intimate Partner Violence: Not on file   Family History  Problem Relation Age of Onset   Lung cancer Father    Cancer Father        lung   Autism Son    Breast cancer Daughter      REVIEW OF SYSTEMS:  Reviewed with the patient as per HPI. PSYCH:  Patient denies having dental phobia.   VITAL SIGNS: BP (!) 99/45 (BP Location: Right Arm, Patient Position: Sitting, Cuff Size: Normal)   Pulse 94   Temp 98 F (36.7 C) (Oral)    PHYSICAL EXAM: GENERAL:  Well-developed, comfortable and in no apparent distress. NEUROLOGICAL:   Alert and oriented to person, place and  time. EXTRAORAL:  Facial symmetry present.  No swelling or lymphadenopathy.  TMJ asymptomatic without clicks or crepitations. [+] Trismus; patient reports symptoms after surgery, but opening has been continuing to improve. [+] Lower lip with healing surgical site MAXIMUM INTERINCISAL OPENING:  15 mm INTRAORAL:  Soft tissues appear well-perfused and mucous membranes moist.  FOM and vestibules soft and not raised. Oral cavity without mass or lesion. No signs of infection, parulis, sinus tract, edema or erythema evident upon exam.   DENTAL EXAM: Hard tissue exam completed and charted.   Of Note:  Patient was unable to open wider than measured MIO due to surgical site on her lip.  Clinical exam was very limited.     OVERALL IMPRESSION:  Fair remaining dentition.       ORAL HYGIENE:  Fair    PERIODONTAL:  Pink, healthy gingival tissue with blunted papilla.   Localized plaque accumulation. [+] Recession:  Generalized FIXED PROSTHODONTICS:  Full-coverage PFM crowns on #3, #5, #14 and #30. OCCLUSION:  Unable to assess occlusion today Non-functional teeth:  #2, #18 OTHER FINDINGS:   [+] Attrition/wear:  #7 and #10 incisal; #23-#26 incisal   RADIOGRAPHIC EXAM:  PAN was exposed and interpreted.   Condyles seated bilaterally in fossas.  No evidence of abnormal pathology.  All visualized osseous structures appear WNL.   Missing teeth #'s 1, 4, 13, 15, 16, 17, 20, 29, 31 & 32.  Existing restorations noted on #2, #3, #5, #7. #10, #12, #14, #18, #19 & #30.  #18 & #19 are drifting mesial. #2 & #18 appear supra-erupted.  #18 distal appears to have vertical bone loss & widening PDL space.   ASSESSMENT:  1.  Merkel cell carcinoma 2.  Pre-head and neck radiation dental evaluation 3.  Missing teeth 4.  Periodontal disease 5.  Accretions on teeth 6.  Attrition/wear 7.  Trismus 8.  Gingival recession, generalized   PROCEDURES: The common and significant  side effects of radiation  therapy to the head and neck were explained and discussed with the patient.  The discussion included side effects of trismus (limited opening), dysgeusia (loss of taste), xerostomia (dry mouth), radiation caries and osteoradionecrosis of the jaw.  I also discussed the importance of maintaining optimal oral hygiene and oral health before, during and after radiation to decrease the risk of developing radiation cavities and the need for any surgery such as extractions after therapy.    Trismus appliance made using patient's baseline MIO which = 8 sticks.  Leta Speller, DAII demonstrated use of appliance.  Verbal and written postop instructions were given to the patient.   PLAN AND RECOMMENDATIONS: I discussed the risks, benefits, and complications of various scenarios with the patient in relationship to their medical and dental conditions, which included systemic infection or other serious issues such as osteoradionecrosis that could potentially occur either before, during or after their anticipated radiation therapy if dental/oral concerns are not addressed.  I explained that if any chronic or acute dental/oral infection(s) are addressed and subsequently not maintained following medical optimization and recovery, their risk of the previously mentioned complications are just as high and could potentially occur postoperatively.  I explained all significant findings of the dental consultation with the patient including limited jaw opening which also limits exam ability, and the recommended care including following up in our clinic after she finishes radiation to reevaluate dentition and radiation side effects.  The patient verbalized understanding of all findings, discussion, and recommendations. We then discussed the importance of returning to her regular dentist and periodontist following radiation to continue to optimize her oral health and minimize risk for post-radiation  complications like radiation caries.  The patient verbalized understanding of all options and is agreeable to the plan.  Plan to discuss all findings and recommendations with medical team and coordinate future care as needed.   The patient will need to establish care at a dental office of her choice/return to regular dentist for routine dental care including replacement of missing teeth as needed, cleanings and exams.  Rx:  Sodium fluoride 1.1% cream to use once daily at bedtime as toothpaste  All questions and concerns were invited and addressed.  The patient tolerated today's visit well and departed in stable condition.  I spent in excess of 120 minutes during the conduct of this consultation and >50% of this time involved direct face-to-face encounter for counseling and/or coordination of the patient's care.  Sandi Mariscal, DMD

## 2021-11-02 NOTE — Therapy (Signed)
OUTPATIENT PHYSICAL THERAPY HEAD AND NECK BASELINE EVALUATION   Patient Name: Tanya Mitchell MRN: [id number] DOB:15-Mar-1945, 76 y.o., female Today's Date: 11/03/2021   PT End of Session - 11/03/21 1036     Visit Number 1    Number of Visits 2    Date for PT Re-Evaluation 01/12/22    PT Start Time 0952    PT Stop Time 1025    PT Time Calculation (min) 33 min    Activity Tolerance Patient tolerated treatment well    Behavior During Therapy [facility] for tasks assessed/performed             Past Medical History:  Diagnosis Date   Breast cancer ([place]) 1995 and 2007   Cardiomyopathy secondary to chemotherapy ([place])    has improved    Hyperlipidemia    Hypertension    under control, has been on med. x 3 yrs.   Melanoma in situ of back ([place]) 09/2011   left   Personal history of chemotherapy    Personal history of radiation therapy    Skin cancer 2013   Melanoma   Past Surgical History:  Procedure Laterality Date   BREAST LUMPECTOMY Right 12/21/2005   right   MASTECTOMY Left 1995   with breast reconstruction - left   RIGHT/LEFT HEART CATH AND CORONARY ANGIOGRAPHY N/A 04/20/2021   Procedure: RIGHT/LEFT HEART CATH AND CORONARY ANGIOGRAPHY;  Surgeon: Jettie Booze, MD;  Location: [facility];  Service: Cardiovascular;  Laterality: N/A;   Patient Active Problem List   Diagnosis Date Noted   Encounter for preoperative dental examination 10/31/2021   Teeth missing 10/31/2021   Periodontal disease 10/31/2021   Generalized gingival recession 10/31/2021   Accretions on teeth 10/31/2021   Dental attrition, excessive 10/31/2021   Trismus 10/31/2021   Merkel cell carcinoma of lip ([place]) 50/93/2671   Acute systolic heart failure (HCC)    Myeloproliferative disorder ([place]) 11/09/2020   MDS/MPN (myelodysplastic/myeloproliferative neoplasms) ([place]) 05/25/2020   Thrombocytosis 11/28/2017   Leukocytosis 11/28/2017   Breast neoplasm, [place] (DCIS), right 01/06/2013   Essential  hypertension 05/12/2012   Melanoma of back, pT2NxMx 09/22/2011   Nonischemic cardiomyopathy ([place]) 08/26/2010    PCP: Benay Pike, MD  REFERRING PROVIDER: Eppie Gibson, MD   REFERRING DIAG: C4A.0 (ICD-10-CM) - Merkel cell carcinoma of lip ([place])   THERAPY DIAG:  Abnormal posture  Merkel cell carcinoma of lip ([place])  Rationale for Evaluation and Treatment Rehabilitation  ONSET DATE: 08/23/21  SUBJECTIVE    SUBJECTIVE STATEMENT: Patient reports they are here today to be seen by their medical team for newly diagnosed markel cell carcinoma of lip.    PERTINENT HISTORY:  Merkel Cell Carcinoma stage IIIB (T2, N1b, M0) She presented with new right lower lip lesion and right facial swelling approximately 4-5 months ago. 08/23/21 Biopsy of right medial lower lip lesion revealed finding consistent with neuroendocrine carcinoma, favoring merkel cell carcinoma. 09/09/21 PET scan revealed 13 mm hypermetabolic mass within the right lower lip, and hypermetabolic right submandibular and right cervical metastatic adenopathy. PET also showed splenomegaly with mild diffusely elevated metabolic activity throughout the spleen, and multiple tiny pulmonary nodules measuring up to 5 mm without elevated metabolic activity. 09/14/21 Consult with Dr. Nicolette Bang who recommended proceeding with wide excision of the lip lesion (with primary closure) and right neck dissection.  09/22/21 CT neck and thyroid showed the exophytic mass lesion arising from the eccentric right lower lip, measuring 1.9 x 1.9 x 1.8 cm, representing the patient's known  Merkel cell carcinoma. CT also showed numerous enlarged right cervical lymph nodes with abnormal morphology, many of which with necrotic changes, involving levels IA, IB, II, III and IV, as well as the right supraclavicular region. 09/29/21 she proceeded with excision of the lower lip lesion and lymph node dissections with Dr. Nicolette Bang. Pathology from the procedure revealed findings  consistent with ulcerated high grade neuroendocrine carcinoma (measuring 2.2 cm) consistent with Merkel cell carcinoma; all margins negative with carcinoma coming within 1 mm of the right margin) nodal status of 19/20 dissected lymph nodes positive for metastatic carcinoma measuring up to 3.5 cm, all with extranodal extension (positive lymph nodes consisted of 2 submental lymph nodes, 1 right submandibular lymph node, and 16 lymph nodes from levels 2A, 3, 4, and 5). She will receive 20 fractions of hypofractionated radiation to her Lip and bilateral neck. Immunotherapy will start after radiation.  She will start on 11/08/21 and complete 12/05/21. She has a myeloproliferative disorder and is followed by Dr. Joan Mayans at [facility] medical oncology and Dr. Chryl Heck. Current treatment includes Aranesp injections q2 weeks and hydrea once daily. Left breast cancer s/p mastectomy and chemo in 1995, right breast cancer s/p lumpectomy followed by XRT in 2007 (hx of chemo induced cardiomyopathy) Stage 3A melanoma on back s/p wide local excision and SLNB (positive) in 2013  PATIENT GOALS   to be educated about the signs and symptoms of lymphedema and learn post op HEP.   PAIN:  Are you having pain? No   PRECAUTIONS: Active CA  WEIGHT BEARING RESTRICTIONS No  FALLS:  Has patient fallen in last 6 months? No Does the patient have a fear of falling that limits activity? No Is the patient reluctant to leave the house due to a fear of falling?No  LIVING ENVIRONMENT: Patient lives with: husband, Tanya Mitchell Lives in: House/apartment Has following equipment at home: None  OCCUPATION: retired  LEISURE: pt does not currently exercise  PRIOR LEVEL OF FUNCTION: Independent   OBJECTIVE  COGNITION:           Overall cognitive status: Within functional limits for tasks assessed                  POSTURE:  Forward head and rounded shoulders posture   30 SEC SIT TO STAND: 13 reps in 30 sec without use  of UEs which is  Good for patient's age   SHOULDER AROM:   Impaired  R shoulder decreased ROM, approx 65% of normal, pt reports it is slowly getting better, she saw an ortho dr that gave her some exercises to do    CERVICAL AROM:   Percent limited  Flexion 50% limited  Extension 75% limited  Right lateral flexion 75% limited   Left lateral flexion 75% limited  Right rotation 75% limited  Left rotation 75% limited    (Blank rows=not tested)   GAIT:  Assessed: Yes Assistance needed: Independent  Ambulation Distance: 15 feet  Assistive Device: none Gait pattern: WFL Ambulation surface: Level  PATIENT EDUCATION:  Education details: Neck ROM, importance of posture when sitting, standing and lying down, deep breathing, walking program and importance of staying active throughout treatment, CURE article on staying active, "Why exercise?" flyer, lymphedema and PT info Person educated: Patient Education method: Explanation, Demonstration, Handout Education comprehension: Patient verbalized understanding and returned demonstration   HOME EXERCISE PROGRAM: Patient was instructed today in a home exercise program today for head and neck range of motion exercises. These included active  cervical flexion, active cervical extension, active cervical rotation to each direction, upper trap stretch, and shoulder retraction. Patient was encouraged to do these 2-3 times a day, holding for 5 sec each and completing for 5 reps. Pt was educated that once this becomes easier then hold the stretches for 30-60 seconds.    ASSESSMENT:  CLINICAL IMPRESSION: Pt arrives to PT with recently diagnosed merkel cell carcinoma of lip. Pt will undergo radiation to lip and bilateral neck followed by immunotherapy. Pt will start treatment on 11/08/21 and complete on 12/05/21.  Pt's cervical ROM is very limited in all directions following her neck dissection. She also has limited R shoulder ROM. Pt reports that she has  been doing stretches issued to her by her orthopedic doctor. She does not want to begin PT at this time for the shoulder or neck ROM. She would like to work on stretching at home since she has so many other doctor appointments. Educated pt that if she feels she is loosing more ROM or is not progressing to see PT sooner.  Educated pt about signs and symptoms of lymphedema as well as anatomy and physiology of lymphatic system. Educated pt in importance of staying as active as possible throughout treatment to decrease fatigue as well as head and neck ROM exercises to decrease loss of ROM. Will see pt after completion of radiation to reassess ROM and assess for lymphedema and to determine therapy needs at that time.  Pt will benefit from skilled therapeutic intervention to improve on the following deficits: Decreased knowledge of precautions and postural dysfunction. Other deficits: decreased ROM and increased fascial restrictions  PT treatment/interventions: ADL/self-care home management, pt/family education, therapeutic exercise.   REHAB POTENTIAL: Good  CLINICAL DECISION MAKING: Stable/uncomplicated  EVALUATION COMPLEXITY: Low   GOALS: Goals reviewed with patient? YES  LONG TERM GOALS: (STG=LTG)   Name Target Date  Goal status  1 Patient will be able to verbalize understanding of a home exercise program for cervical range of motion, posture, and walking.   Baseline:  No knowledge 11/02/2021 Achieved at eval  2 Patient will be able to verbalize understanding of proper sitting and standing posture. Baseline:  No knowledge 11/02/2021 Achieved at eval  3 Patient will be able to verbalize understanding of lymphedema risk and availability of treatment for this condition Baseline:  No knowledge 11/02/2021 Achieved at eval  4 Pt will demonstrate a return to full cervical ROM and function post operatively compared to baselines and not demonstrate any signs or symptoms of lymphedema.  Baseline: See  objective measurements taken today. 01/12/2022 New     PLAN: PT FREQUENCY/DURATION: EVAL and 1 follow up appointment.   PLAN FOR NEXT SESSION: will reassess 2 weeks after completion of radiation to determine needs.  Patient will follow up at outpatient cancer rehab 2 weeks after completion of radiation.  If the patient requires physical therapy at that time, a specific plan will be dictated and sent to the referring physician for approval. The patient was educated today on appropriate basic range of motion exercises to begin now and continue throughout radiation and educated on the signs and symptoms of lymphedema. Patient verbalized good understanding.     Physical Therapy Information for During and After Head/Neck Cancer Treatment: Lymphedema is a swelling condition that you may be at risk for in your neck and/or face if you have radiation treatment to the area and/or if you have surgery that includes removing lymph nodes.  There is treatment available for this  condition and it is not life-threatening.  Contact your physician or physical therapist with concerns. An excellent resource for those seeking information on lymphedema is the National Lymphedema Network's website.  It can be accessed at [place].org If you notice swelling in your neck or face at any time following surgery (even if it is many years from now), please contact your doctor or physical therapist to discuss this.  Lymphedema can be treated at any time but it is easier for you if it is treated early on. If you have had surgery to your neck, please check with your surgeon about how soon to start doing neck range of motion exercises.  If you are not having surgery, I encourage you to start doing neck range of motion exercises today and continue these while undergoing treatment, UNLESS you have irritation of your skin or soft tissue that is aggravated by doing them.  These exercises are intended to help you prevent loss of range  of motion and/or to gain range of motion in your neck (which can be limited by tightening effects of radiation), and NOT to aggravate these tissues if they develop sensitivities from treatment. Neck range of motion exercises should be done to the point of feeling a GENTLE, TOLERABLE stretch only.  You are encouraged to start a walking or other exercise program tomorrow and continue this as much as you are able through and after treatment.  Please feel free to call me with any questions. Manus Gunning, PT, CLT Physical Therapist and Certified Lymphedema Therapist [facility] [address], [address], [place], [place] [address] [phone] [email or web address]  WALKING  Walking is a great form of exercise to increase your strength, endurance and overall fitness.  A walking program can help you start slowly and gradually build endurance as you go.  Everyone's ability is different, so each person's starting point will be different.  You do not have to follow them exactly.  The are just samples. You should simply find out what's right for you and stick to that program.   In the beginning, you'll start off walking 2-3 times a day for short distances.  As you get stronger, you'll be walking further at just 1-2 times per day.  A. You Can Walk For A Certain Length Of Time Each Day    Walk 5 minutes 3 times per day.  Increase 2 minutes every 2 days (3 times per day).  Work up to 25-30 minutes (1-2 times per day).   Example:   Day 1-2 5 minutes 3 times per day   Day 7-8 12 minutes 2-3 times per day   Day 13-14 25 minutes 1-2 times per day  B. You Can Walk For a Certain Distance Each Day     Distance can be substituted for time.    Example:   3 trips to mailbox (at road)   3 trips to corner of block   3 trips around the block  C. Go to local high school and use the track.    Walk for distance ____ around track  Or time ____ minutes  D. Walk  ____ Jog ____ Run ___   Why exercise?  So many benefits! Here are SOME of them: Heart health, including raising your good cholesterol level and reducing heart rate and blood pressure Lung health, including improved lung capacity It burns fats, and most of [place] can stand to be leaner, whether or not we are overweight. It increases the body's natural  painkillers and mood elevators, so makes you feel better. Not only makes you feel better, but look better too Improves sleep Takes a bite out of stress May decrease your risk of many types of cancer If you are currently undergoing cancer treatment, exercise may improve your ability to tolerate treatments including chemotherapy. For everybody, it can improve your energy level. Those with cancer-related fatigue report a 40-50% reduction in this symptom when exercising regularly. If you are a survivor of breast, colon, or prostate cancer, it may decrease your risk of a recurrence. (This may hold for other cancers too, but so far we have data just for these three types.)  How to exercise: Get your doctor's okay. Pick something you enjoy doing, like walking, Zumba, biking, swimming, or whatever. Start at low intensity and time, then gradually increase.  (See walking program handout.) Set a goal to achieve over time.  The American Cancer Society, American Heart Association, and U.S. Dept. of Health and Human Services recommend 150 minutes of moderate exercise, 75 minutes of vigorous exercise, or a combination of both per week. This should be done in episodes at least 10 minutes long, spread throughout the week.  Need help being motivated? Pick something you enjoy doing, because you'll be more inclined to stick with that activity than something that feels like a chore. Do it with a friend so that you are accountable to each other. Schedule it into your day. Place it on your calendar and keep that appointment just like you do any appointment that you  make. Join an exercise group that meets at a specific time.  That way, you have to show up on time, and that makes it harder to procrastinate about doing your workout.  It also keeps you accountable--people begin to expect you to be there. Join a gym where you feel comfortable and not intimidated, at the right cost. Sign up for something that you'll need to be in shape for on a specific date, like a 1K or a 5K to walk or run, a 20 or 30 mile bike ride, a mud run or something like that. If the date is looming, you know you'll need to train to be ready for it.  An added benefit is that many of these are fundraisers for good causes. If you've already paid for a gym membership, group exercise class or event, you might as well work out, so you haven't wasted your money!    [facility], PT 11/03/2021, 10:52 AM

## 2021-11-03 ENCOUNTER — Encounter: Payer: Self-pay | Admitting: Physical Therapy

## 2021-11-03 ENCOUNTER — Other Ambulatory Visit: Payer: Self-pay

## 2021-11-03 ENCOUNTER — Ambulatory Visit: Payer: Medicare Other | Attending: Radiation Oncology | Admitting: Physical Therapy

## 2021-11-03 ENCOUNTER — Inpatient Hospital Stay: Payer: Medicare Other | Admitting: Licensed Clinical Social Worker

## 2021-11-03 ENCOUNTER — Ambulatory Visit: Payer: Medicare Other | Attending: Radiation Oncology

## 2021-11-03 DIAGNOSIS — C4A0 Merkel cell carcinoma of lip: Secondary | ICD-10-CM | POA: Insufficient documentation

## 2021-11-03 DIAGNOSIS — R293 Abnormal posture: Secondary | ICD-10-CM | POA: Diagnosis not present

## 2021-11-03 DIAGNOSIS — R1310 Dysphagia, unspecified: Secondary | ICD-10-CM | POA: Diagnosis not present

## 2021-11-03 NOTE — Progress Notes (Signed)
[facility] Clinical Social Work  Initial Assessment   Tanya Mitchell is a 76 y.o. year old female presenting alone to H&N clinic. Clinical Social Work was referred by nurse navigator for assessment of psychosocial needs.   SDOH (Social Determinants of Health) assessments performed: Yes SDOH Interventions    Flowsheet Row Clinical Support from 11/03/2021 in [facility]  SDOH Interventions   Food Insecurity Interventions Intervention Not Indicated  Housing Interventions Intervention Not Indicated  Transportation Interventions Intervention Not Indicated  Utilities Interventions Intervention Not Indicated  Financial Strain Interventions Intervention Not Indicated       SDOH Screenings   Food Insecurity: No Food Insecurity (11/03/2021)  Housing: Low Risk  (11/03/2021)  Transportation Needs: No Transportation Needs (11/03/2021)  Utilities: Not At Risk (11/03/2021)  Financial Resource Strain: Low Risk  (11/03/2021)  Tobacco Use: Medium Risk (11/03/2021)     Distress Screen completed: No     No data to display            Family/Social Information:  Housing Arrangement: patient lives with husband, Coralyn Mark Family members/support persons in your life? Family and Friends Transportation concerns: no. Able to drive herself again and has her husband and friend support as well Employment: Retired .  Income source: retirement income & savings Financial concerns: No Type of concern: None Food access concerns: no, many friends helping bring food  Religious or spiritual practice: Not known Services Currently in place:  n/a  Coping/ Adjustment to diagnosis: Patient understands treatment plan and what happens next? yes Concerns about diagnosis and/or treatment:  general adjustment while also dealing with other stressors this year Patient reported stressors:  new blood disorder diagnosed this year, son died 6 months ago Current coping skills/ strengths: Ability  for insight , Active sense of humor , Capable of independent living , Communication skills , and Supportive family/friends     SUMMARY: Current SDOH Barriers:  No significant SDOH needs noted today  Clinical Social Work Clinical Goal(s):  No clinical social work goals at this time  Interventions: Discussed common feeling and emotions when being diagnosed with cancer, and the importance of support during treatment Informed patient of the support team roles and support services at [facility] Provided [place] contact information and encouraged patient to call with any questions or concerns   Follow Up Plan: Patient will contact CSW with any support or resource needs Patient verbalizes understanding of plan: Yes    Elenore Wanninger E Chyan Carnero, LCSW

## 2021-11-03 NOTE — Therapy (Signed)
OUTPATIENT SPEECH LANGUAGE PATHOLOGY ONCOLOGY EVALUATION   Patient Name: Tanya Mitchell MRN: [id number] DOB:1945/07/23, 76 y.o., female Today's Date: 11/03/2021  PCP: None REFERRING PROVIDER: Eppie Gibson, MD   End of Session - 11/03/21 1122     Visit Number 1    Number of Visits 4    Date for SLP Re-Evaluation 02/01/22    SLP Start Time 1030    SLP Stop Time  1105    SLP Time Calculation (min) 35 min    Activity Tolerance Patient tolerated treatment well             Past Medical History:  Diagnosis Date   Breast cancer ([place]) 1995 and 2007   Cardiomyopathy secondary to chemotherapy ([place])    has improved    Hyperlipidemia    Hypertension    under control, has been on med. x 3 yrs.   Melanoma in situ of back ([place]) 09/2011   left   Personal history of chemotherapy    Personal history of radiation therapy    Skin cancer 2013   Melanoma   Past Surgical History:  Procedure Laterality Date   BREAST LUMPECTOMY Right 12/21/2005   right   MASTECTOMY Left 1995   with breast reconstruction - left   RIGHT/LEFT HEART CATH AND CORONARY ANGIOGRAPHY N/A 04/20/2021   Procedure: RIGHT/LEFT HEART CATH AND CORONARY ANGIOGRAPHY;  Surgeon: Jettie Booze, MD;  Location: [facility];  Service: Cardiovascular;  Laterality: N/A;   Patient Active Problem List   Diagnosis Date Noted   Encounter for preoperative dental examination 10/31/2021   Teeth missing 10/31/2021   Periodontal disease 10/31/2021   Generalized gingival recession 10/31/2021   Accretions on teeth 10/31/2021   Dental attrition, excessive 10/31/2021   Trismus 10/31/2021   Merkel cell carcinoma of lip ([place]) 57/32/2025   Acute systolic heart failure ([place])    Myeloproliferative disorder ([place]) 11/09/2020   MDS/MPN (myelodysplastic/myeloproliferative neoplasms) ([place]) 05/25/2020   Thrombocytosis 11/28/2017   Leukocytosis 11/28/2017   Breast neoplasm, Tis (DCIS), right 01/06/2013   Essential  hypertension 05/12/2012   Melanoma of back, pT2NxMx 09/22/2011   Nonischemic cardiomyopathy ([place]) 08/26/2010    ONSET DATE: Spring 2023   REFERRING DIAG: Merkel Cell Carcinoma stage IIIB (T2, N1b, M0)  THERAPY DIAG:  Dysphagia, unspecified type  Rationale for Evaluation and Treatment Rehabilitation  SUBJECTIVE:   SUBJECTIVE STATEMENT: Pt denies s/sx of oropharyngeal dysphagia.  "I'm a little reluctant to smile..." (due to healing from surgery to medial/rt lower lip 09/29/21. Pt accompanied by: self  PERTINENT HISTORY: She presented with new right lower lip lesion and right facial swelling approximately 4-5 months ago. 08/23/21 Biopsy of right medial lower lip lesion revealed finding consistent with neuroendocrine carcinoma, favoring merkel cell carcinoma. 09/09/21 PET scan revealed 13 mm hypermetabolic mass within the right lower lip, and hypermetabolic right submandibular and right cervical metastatic adenopathy. PET also showed splenomegaly with mild diffusely elevated metabolic activity throughout the spleen, and multiple tiny pulmonary nodules measuring up to 5 mm without elevated metabolic activity. 09/14/21 Consult with Dr. Nicolette Bang who recommended proceeding with wide excision of the lip lesion (with primary closure) and right neck dissection. 09/22/21 CT neck and thyroid showed the exophytic mass lesion arising from the eccentric right lower lip, measuring 1.9 x 1.9 x 1.8 cm, representing the patient's known Merkel cell carcinoma. CT also showed numerous enlarged right cervical lymph nodes with abnormal morphology, many of which with necrotic changes, involving levels IA, IB, II, III and IV,  as well as the right supraclavicular region. 09/29/21 she proceeded with excision of the lower lip lesion and lymph node dissections with Dr. Nicolette Bang. Pathology from the procedure revealed findings consistent with ulcerated high grade neuroendocrine carcinoma (measuring 2.2 cm) consistent with Merkel cell  carcinoma; all margins negative with carcinoma coming within 1 mm of the right margin) nodal status of 19/20 dissected lymph nodes positive for metastatic carcinoma measuring up to 3.5 cm, all with extranodal extension (positive lymph nodes consisted of 2 submental lymph nodes, 1 right submandibular lymph node, and 16 lymph nodes from levels 2A, 3, 4, and 5). [place] [facility] board recommended consult for systemic therapy and radiation therapy. 10/18/21 Consult with Dr. Chryl Heck. She agreed to proceed with adjuvant immunotherapy nivolumab every 4 weeks. This will be initiated following radiation. 10/21/21 Consult with Dr. Isidore Moos. Treatment plan:  She will receive 20 fractions of hypofractionated radiation to her Lip and bilateral neck. Immunotherapy will start after radiation.  She will start on 11/08/21 and complete 12/05/21.  PAIN:  Are you having pain? No   LIVING ENVIRONMENT: Lives with: lives alone Lives in: House/apartment   PLOF:  Level of assistance: Independent with ADLs Employment: Retired     PATIENT GOALS: Maintain WNL swallowing   OBJECTIVE:    DIAGNOSTIC FINDINGS: See above in "pertinent history"     COGNITION: Overall cognitive status: Within functional limits for tasks assessed   LANGUAGE: Receptive and Expressive language appeared WNL.    ORAL MOTOR EXAMINATION Overall status: Impaired: Labial: Right (ROM and Symmetry)  ROM limitations may be partially due to rt labial structure due to labial surgery. See "s" statement.   MOTOR SPEECH: Overall motor speech: Appears intact, very slight dysarthria due to rt lower labial changes post surgery   CLINICAL SWALLOW ASSESSMENT:   Current diet: regular diet and thin liquids, cut into smaller bites due to healing of surgical site Dentition: adequate natural dentition Patient directly observed with POs: Yes: soft diet ([place] sandwich), and thin liquids  Feeding: able to feed self Liquids provided by: cup Oral phase signs and  symptoms: pt administered thin liquids and sandwich bites on her lt side due to healing from surgical site Pharyngeal phase signs and symptoms: none noted today   TODAY'S TREATMENT:  Research states the risk for dysphagia increases due to radiation treatment due to a variety of factors, so SLP educated the pt about the possibility of reduced/limited ability for PO intake during rad tx. SLP also educated pt regarding possible changes to swallowing musculature after rad tx, and why adherence to dysphagia HEP provided today and PO consumption was necessary to inhibit muscle changes following rad tx. SLP informed pt why this would be detrimental to her swallowing status and pulmonary health. Pt demonstrated understanding of these things to SLP. SLP encouraged pt to safely eat and drink as deep into their radiation/chemotherapy as possible to provide the best possible long-term swallowing outcome for pt.    SLP then developed a HEP for pt involving oral and pharyngeal strengthening and ROM and pt was instructed how to perform these exercises, including SLP demonstration. After SLP demonstration, pt return demonstrated each exercise. SLP ensured pt performance was correct prior to educating pt on next exercise. Pt required SBA faded to modified independent to perform HEP. Pt was instructed to complete this program at least 5 days/week, at least 2 times a day until six months after her last day of radiation, and then x2 a week after that, indefinitely.  PATIENT EDUCATION: Education details: late effects head/neck radiation on swallow function, HEP procedure, timing of decreasing frequency of HEP Person educated: Patient Education method: Explanation, Demonstration, Verbal cues Education comprehension: verbalized understanding, returned demonstration, verbal cues required, and needs further education     ASSESSMENT:   CLINICAL IMPRESSION: Patient is a 76 y/o female who was seen today for assessment of  swallowing as they undergo radiation therapy. Today pt ate 4 bites of [place] sandwich, and drank thin liquids. At this time pt swallowing is deemed WNL/WFL with these POs. Oral deficits described above, and no overt s/sx pharyngeal deficits, including aspiration, were observed today. There are no overt s/s aspiration PNA observed by SLP nor any reported by pt at this time. Data indicate that pt's swallow ability will likely decrease over the course of radiation therapy and could very well decline over time following the conclusion of that therapy due to muscle disuse atrophy and/or muscle fibrosis. Pt will cont to need to be seen by SLP in order to assess safety of PO intake, assess the need for recommending any objective swallow assessment, and ensuring pt is correctly completing the individualized HEP.   OBJECTIVE IMPAIRMENTS include dysphagia. These impairments are limiting patient from safety when swallowing. Factors affecting potential to achieve goals and functional outcome are limited/reduced literacy . Patient will benefit from skilled SLP services to address above impairments and improve overall function.   REHAB POTENTIAL: Good   GOALS: Goals reviewed with patient? No   SHORT TERM GOALS: Target date:  2 therapy visits (visit #3)       pt will complete HEP with rare min A  Baseline: Goal status: INITIAL   2.  pt will tell SLP why pt is completing HEP with modified independence Baseline:  Goal status: INITIAL   3.  pt will describe 3 overt s/s aspiration PNA with modified independence Baseline:  Goal status: INITIAL     LONG TERM GOALS: Target date:6 therapy visits (visit #7)      pt will complete HEP with modified independnence in 2 sessions Baseline:  Goal status: INITIAL   2.  pt will describe how to modify HEP over time, and the timeline associated with reduction in HEP frequency with modified independence over two sessions Baseline:  Goal status: INITIAL      PLAN: SLP FREQUENCY:  once every approx 4 weeks   SLP DURATION:  7 total sessions   PLANNED INTERVENTIONS: Aspiration precaution training, Pharyngeal strengthening exercises, Diet toleration management , Trials of upgraded texture/liquids, Internal/external aids, SLP instruction and feedback, Compensatory strategies, and Patient/family education  [facility], [place] 11/03/2021, 11:23 AM

## 2021-11-03 NOTE — Progress Notes (Signed)
Oncology Nurse Navigator Documentation   I met with Tanya Mitchell before and after her appointments with SLP and PT during head and neck MDC today. I answered her questions regarding future appointments. She is ready to start treatment next week. She knows to call me if she has any questions at any time.  Harlow Asa RN, BSN, OCN Head & Neck Oncology Nurse [place] at [facility] Phone # [phone]  Fax # [phone]

## 2021-11-07 ENCOUNTER — Encounter: Payer: Self-pay | Admitting: Cardiology

## 2021-11-07 ENCOUNTER — Ambulatory Visit: Payer: Medicare Other | Attending: Cardiology | Admitting: Cardiology

## 2021-11-07 VITALS — BP 92/60 | HR 90 | Ht 62.0 in | Wt 96.0 lb

## 2021-11-07 DIAGNOSIS — E782 Mixed hyperlipidemia: Secondary | ICD-10-CM

## 2021-11-07 DIAGNOSIS — Z79899 Other long term (current) drug therapy: Secondary | ICD-10-CM

## 2021-11-07 DIAGNOSIS — I5022 Chronic systolic (congestive) heart failure: Secondary | ICD-10-CM

## 2021-11-07 DIAGNOSIS — I10 Essential (primary) hypertension: Secondary | ICD-10-CM | POA: Diagnosis not present

## 2021-11-07 DIAGNOSIS — C50919 Malignant neoplasm of unspecified site of unspecified female breast: Secondary | ICD-10-CM

## 2021-11-07 DIAGNOSIS — Z510 Encounter for antineoplastic radiation therapy: Secondary | ICD-10-CM | POA: Diagnosis not present

## 2021-11-07 DIAGNOSIS — C4A0 Merkel cell carcinoma of lip: Secondary | ICD-10-CM | POA: Diagnosis not present

## 2021-11-07 DIAGNOSIS — I428 Other cardiomyopathies: Secondary | ICD-10-CM | POA: Diagnosis not present

## 2021-11-07 MED ORDER — DAPAGLIFLOZIN PROPANEDIOL 10 MG PO TABS: 10.0000 mg | ORAL_TABLET | Freq: Every day | ORAL | 3 refills | Status: AC

## 2021-11-07 MED ORDER — ENTRESTO 24-26 MG PO TABS: 1.0000 | ORAL_TABLET | Freq: Two times a day (BID) | ORAL | 3 refills | Status: DC

## 2021-11-07 NOTE — Progress Notes (Signed)
Cardiology Office Note:    Date:  11/07/2021   ID:  Tanya Mitchell, DOB 1945/09/15, MRN [id number]  PCP:  Benay Pike, MD   [facility] Providers Cardiologist:  None {  Referring MD: Benay Pike, MD   History of Present Illness:    Tanya Mitchell is a 76 y.o. female with a hx of prior cardiomyopathy secondary to chemo from breast cancer with improved EF, melanoma, HLD and DMII who was previously followed by Truitt Merle, NP who now presents to clinic for follow-up.  Seen in clinic 08/2020 where she was doing well with no anginal or HF symptoms. TTE 09/2020 with LVEF 45-50%, normal RV, normal PASP, severe LAE, trivial MR, no significant aortic disease.  Was seen in clinic on 03/29/21 as an urgent visit for worsening edema, weight gain, fatigue and abdominal distension. TTE with LVEF 25-30%, mild LV dilation, G2DD, mild RV dysfunction, severe LAE, mild MR, moderate pleural effusion and small pericardial effusion. We discussed hospitalization but she wanted to trial out-patient management. We started her on lasix '40mg'$  BID x5 days and then daily thereafter. We held her coreg and started spiro.   She saw Dr. Irish Lack in follow-up on 03/2021 where she had lost 30lbs. Symptoms had improved significantly and she felt back to baseline. She was resumed on coreg 6.'25mg'$  BID and her lasix was changed to prn.  She underwent RHC and LHC on 04/2021  which showed no significant CAD. RHC with Ao sat 100%, PA sat 71%, PA pressure 23/3, mean PA pressure 11 mm Hg; mean PCWP 3 mm Hg; CO 5.7 L/min; CI 3.88.   Was last seen in clinic 07/2021 where she was feeling well. Blood pressure had been soft and her coreg was changed to metop and spiro was held. Repeat TTE 10/2021 with EF improved to 40-45%, G1DD, normal EF, normal PASP, no significant valve disease.  Today, the patient states that she is doing overall okay. 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 She is planned to start XRT therapy tomorrow. She will have 20 treatments over 4 weeks.   Otherwise, she is doing very well from a CV standpoint. Continues to struggle with energy which she attributes to her anemia.She denies any palpitations, chest pain, or shortness of breath. No lightheadedness, headaches, syncope, orthopnea, or PND. Has been compliant with all her medications without issues.   Past Medical History:  Diagnosis Date   Breast cancer ([place]) 1995 and 2007   Cardiomyopathy secondary to chemotherapy [facility])    has improved    Hyperlipidemia    Hypertension    under control, has been on med. x 3 yrs.   Melanoma in situ of back ([place]) 09/2011   left   Personal history of chemotherapy    Personal history of radiation therapy    Skin cancer 2013   Melanoma    Past Surgical History:  Procedure Laterality Date   BREAST LUMPECTOMY Right 12/21/2005   right   MASTECTOMY Left 1995   with breast reconstruction - left   RIGHT/LEFT HEART CATH AND CORONARY ANGIOGRAPHY N/A 04/20/2021   Procedure: RIGHT/LEFT HEART CATH AND CORONARY ANGIOGRAPHY;  Surgeon: Jettie Booze, MD;  Location: [facility];  Service: Cardiovascular;  Laterality: N/A;    Current Medications: Current Meds  Medication Sig   allopurinol (ZYLOPRIM) 300 MG tablet Take 1 tablet (300 mg total) by mouth daily.   aspirin EC 81 MG tablet Take  1 tablet (81 mg total) by mouth daily. Swallow whole.   atorvastatin (LIPITOR) 80 MG tablet Take 1 tablet (80 mg total) by mouth daily.   cholecalciferol (VITAMIN D) 1000 UNITS tablet Take 2,000 Units by mouth in the morning.   furosemide (LASIX) 40 MG tablet Take 1 tablet (40 mg total) by mouth daily as needed.   hydroxyurea (HYDREA) 500 MG capsule Take 500 mg by mouth in the morning and at bedtime. In the evening. May take with food to minimize GI side effects.   Magnesium 400 MG TABS Take 1 tablet by mouth daily as needed. Only take on days  taking furosemide   metFORMIN (GLUCOPHAGE) 500 MG tablet Take 0.5 tablets (250 mg total) by mouth in the morning and at bedtime.   metoprolol succinate (TOPROL XL) 25 MG 24 hr tablet Take 0.5 tablets (12.5 mg total) by mouth at bedtime. Please hold this med if systolic BP is 90 or less.   potassium chloride SA (KLOR-CON M) 20 MEQ tablet Take 1 tablet (20 mEq total) by mouth daily as needed. Only on days taking furosemide   sodium fluoride (SODIUM FLUORIDE 5000 PLUS) 1.1 % CREA dental cream Place pea-size amount onto toothbrush every evening before bedtime to brush teeth. Do NOT rinse with water, eat or drink for at least 30 minutes after use.   triamcinolone (KENALOG) 0.025 % ointment Apply topically as needed.   [DISCONTINUED] dapagliflozin propanediol (FARXIGA) 10 MG TABS tablet Take 1 tablet (10 mg total) by mouth daily before breakfast.   [DISCONTINUED] sacubitril-valsartan (ENTRESTO) 24-26 MG Take 1 tablet by mouth 2 (two) times daily.     Allergies:   Patient has no known allergies.   Social History   Socioeconomic History   Marital status: Married    Spouse name: Not on file   Number of children: Not on file   Years of education: Not on file   Highest education level: Not on file  Occupational History   Not on file  Tobacco Use   Smoking status: Former    Types: Cigarettes    Quit date: 02/14/1964    Years since quitting: 57.7   Smokeless tobacco: Never  Vaping Use   Vaping Use: Never used  Substance and Sexual Activity   Alcohol use: Yes    Comment: daily glass of wine   Drug use: No   Sexual activity: Yes    Birth control/protection: Post-menopausal  Other Topics Concern   Not on file  Social History Narrative   Not on file   Social Determinants of Health   Financial Resource Strain: Low Risk  (11/03/2021)   Overall Financial Resource Strain (CARDIA)    Difficulty of Paying Living Expenses: Not hard at all  Food Insecurity: No Food Insecurity (11/03/2021)   Hunger  Vital Sign    Worried About Running Out of Food in the Last Year: Never true    [place] in the Last Year: Never true  Transportation Needs: No Transportation Needs (11/03/2021)   PRAPARE - Hydrologist (Medical): No    Lack of Transportation (Non-Medical): No  Physical Activity: Not on file  Stress: Not on file  Social Connections: Not on file     Family History: The patient's family history includes Autism in her son; Breast cancer in her daughter; Cancer in her father; Lung cancer in her father.  ROS:   Please see the history of present illness.    Review of  Systems  Constitutional:  Negative for chills, fever and malaise/fatigue.  HENT:  Negative for hearing loss.   Eyes:  Negative for blurred vision.  Respiratory:  Negative for shortness of breath.   Cardiovascular:  Negative for chest pain, palpitations, orthopnea, claudication, leg swelling and PND.  Gastrointestinal:  Negative for melena, nausea and vomiting.  Genitourinary:  Negative for hematuria.  Musculoskeletal:  Negative for falls.  Neurological:  Negative for dizziness and loss of consciousness.  Psychiatric/Behavioral:  Negative for substance abuse.      EKGs/Labs/Other Studies Reviewed:    The following studies were reviewed today:  TTE 10/2021: IMPRESSIONS   1. Left ventricular ejection fraction, by estimation, is 40 to 45%. The  left ventricle has mildly decreased function. The left ventricle has no  regional wall motion abnormalities. Left ventricular diastolic parameters  are consistent with Grade I  diastolic dysfunction (impaired relaxation).   2. Right ventricular systolic function is normal. The right ventricular  size is normal. There is normal pulmonary artery systolic pressure. The  estimated right ventricular systolic pressure is 82.9 mmHg.   3. The mitral valve is normal in structure. No evidence of mitral valve  regurgitation. No evidence of mitral stenosis.    4. The aortic valve is normal in structure. Aortic valve regurgitation is  not visualized. No aortic stenosis is present.   5. The inferior vena cava is normal in size with greater than 50%  respiratory variability, suggesting right atrial pressure of 3 mmHg.   Comparison(s): Prior EF 25-30%.   RHC/LHC 04/20/21:   There is moderate left ventricular systolic dysfunction.   LV end diastolic pressure is normal.   The left ventricular ejection fraction is 35-45% by visual estimate.   There is no aortic valve stenosis.   No angiographically apparent coronary artery disease.   Ao sat 100%, PA sat 71%, PA pressure 23/3, mean PA pressure 11 mm Hg; mean PCWP 3 mm Hg; CO 5.7 L/min; CI 3.88   Nonischemic cardiomyopathy.  Normal hemodynamics.  Continue medical therapy.   TTE 03/29/21: 1. Compared to 09/30/20 LV function is worse.   2. Left ventricular ejection fraction, by estimation, is 25 to 30%. The  left ventricle has severely decreased function. The left ventricle  demonstrates global hypokinesis. The left ventricular internal cavity size  was mildly dilated. Left ventricular  diastolic parameters are consistent with Grade II diastolic dysfunction  (pseudonormalization). Elevated left atrial pressure.   3. Right ventricular systolic function is mildly reduced. The right  ventricular size is normal.   4. Left atrial size was severely dilated.   5. A small pericardial effusion is present. Moderate pleural effusion in  the left lateral region.   6. The mitral valve is normal in structure. Mild mitral valve  regurgitation. No evidence of mitral stenosis.   7. The aortic valve is tricuspid. Aortic valve regurgitation is not  visualized. Aortic valve sclerosis is present, with no evidence of aortic  valve stenosis.   8. The inferior vena cava is normal in size with greater than 50%  respiratory variability, suggesting right atrial pressure of 3 mmHg.   Comparison(s): Prior Ejection Fraction  45-50%.  TTE 11/13/18: IMPRESSIONS   1. Left ventricular ejection fraction, by visual estimation, is 45 to  50%. The left ventricle has mildly decreased function. Normal left  ventricular size. There is no left ventricular hypertrophy.   2. Elevated left ventricular end-diastolic pressure.   3. Left ventricular diastolic Doppler parameters are consistent with  impaired  relaxation pattern of LV diastolic filling.   4. Global longitudinal strain abnormal (-11.5%). Very slightly worse than  10/2017.   5. Global right ventricle has normal systolic function.The right  ventricular size is normal. No increase in right ventricular wall  thickness.   6. Left atrial size was moderately dilated.   7. Right atrial size was normal.   8. The mitral valve is normal in structure. Trace mitral valve  regurgitation. No evidence of mitral stenosis.   9. The tricuspid valve is normal in structure. Tricuspid valve  regurgitation is mild.  10. The aortic valve is normal in structure. Aortic valve regurgitation  was not visualized by color flow Doppler. Structurally normal aortic  valve, with no evidence of sclerosis or stenosis.  11. The pulmonic valve was normal in structure. Pulmonic valve  regurgitation is trivial by color flow Doppler.  12. Mildly elevated pulmonary artery systolic pressure.  13. The inferior vena cava is normal in size with greater than 50%  respiratory variability, suggesting right atrial pressure of 3 mmHg.    EKG:  EKG is personally reviewed. 11/07/2021: EKG was not ordered. 03/29/2021: NSR with 1 degree AVB, poor r-wave progression (similar to prior)  Recent Labs: 03/29/2021: NT-Pro BNP 8,726 04/04/2021: Magnesium 2.0 10/25/2021: TSH 2.816 10/31/2021: ALT 29; BUN 27; Creatinine 0.70; Hemoglobin 9.5; Platelet Count 54; Potassium 4.2; Sodium 136   Recent Lipid Panel    Component Value Date/Time   CHOL 126 01/28/2020 1315   TRIG 190 (H) 01/28/2020 1315   HDL 29 (L) 01/28/2020  1315   CHOLHDL 4.3 01/28/2020 1315   LDLCALC 65 01/28/2020 1315     Risk Assessment/Calculations:           Physical Exam:    VS:  BP 92/60   Pulse 90   Ht '5\' 2"'$  (1.575 m)   Wt 96 lb (43.5 kg)   SpO2 97%   BMI 17.56 kg/m     Wt Readings from Last 3 Encounters:  11/07/21 96 lb (43.5 kg)  10/28/21 96 lb 6.4 oz (43.7 kg)  10/21/21 96 lb (43.5 kg)     GEN: Well nourished, well developed in no acute distress HEENT: Right lip incision site c/d/i NECK: No JVD; No carotid bruits CARDIAC: RRR, 1/6 systolic murmur, No rubs, no gallops RESPIRATORY:  Clear to auscultation without rales, wheezing or rhonchi  ABDOMEN: Soft, non-tender, non-distended MUSCULOSKELETAL:  No edema; No deformity  SKIN: Warm and dry; scattered ecchymosis of bilateral UE. NEUROLOGIC:  Alert and oriented x 3 PSYCHIATRIC:  Normal affect     ASSESSMENT:    1. Chronic systolic heart failure ([place])   2. Medication management   3. Nonischemic cardiomyopathy ([place])   4. Essential hypertension   5. Mixed hyperlipidemia   6. NICM (nonischemic cardiomyopathy) ([place])   7. Malignant neoplasm of female breast, unspecified estrogen receptor status, unspecified laterality, unspecified site of breast ([place])      PLAN:    In order of problems listed above:  #Chronic Systolic Heart Failure  #Nonischemic CM: #History of chemotherapy induced CM: Patient with history of chemo induced CM with improved EF to 45-50% on multiple subsequent TTEs. Was doing well until about 01/2021 when she developed progressive fatigue, LE edema and abdominal distension. Saw her oncologist who ordered TTE on 03/29/21 which showed EF 25% global hypokinesis, mild-to-moderate MR, severe LAE, RV mildly enlarged, elevated filling pressures, RAP 42mHg, small pericardial effusion. Responded very well to outpatient diuresis and GDMT. [id number] with normal filling  pressures. Cath with no obstructive disease.  Repeat TTE 10/2021 with improved EF to  40-45%.  -Cath with no obstructive CAD -Continue lasix '40mg'$  prn for weight gain -Continue metop 12.'5mg'$  XL daily  -Continue entresto 24/'26mg'$  BID -Stopped spiro due to hypotension  -Continue farxiga '10mg'$  daily -Low Na diet  #HTN: Now with soft blood pressures but no orthostatic symptoms since stopping the spironolactone. -Continue metop 12.'5mg'$  XL daily  -Continue entresto 24/'26mg'$  BID -Stopped spiro due to hypotension   #HLD: LDL 65 01/2020. -Continue lipitor '80mg'$  daily -Will not repeat lipids today given significant number of lab draws; can draw with onc in the future  #History of Breast Cancer in remission History of chemo with anthracycline and XRT. Had depressed LVEF with chemo but recovered back to 40-45%. -Management acute on chronic HF as above  #Leukocytosis: #Thrombocytosis: -On hydroxyurea  #Merkel Cell Carcinoma: -Follows with Onc -Plans to start XRT  Follow-up:   6 months.  Medication Adjustments/Labs and Tests Ordered: Current medicines are reviewed at length with the patient today.  Concerns regarding medicines are outlined above.   No orders of the defined types were placed in this encounter.  Meds ordered this encounter  Medications   dapagliflozin propanediol (FARXIGA) 10 MG TABS tablet    Sig: Take 1 tablet (10 mg total) by mouth daily before breakfast.    Dispense:  90 tablet    Refill:  3   sacubitril-valsartan (ENTRESTO) 24-26 MG    Sig: Take 1 tablet by mouth 2 (two) times daily.    Dispense:  180 tablet    Refill:  3    Please Honor Card patient is presenting for Carmie Kanner: [id number]; Juanna Cao: [id number]; ZDGUY: OHS; QIHK: [phone]   Patient Instructions  Medication Instructions:   Your physician recommends that you continue on your current medications as directed. Please refer to the Current Medication list given to you today.  *If you need a refill on your cardiac medications before your next appointment, please call your  pharmacy*    Follow-Up: At [facility], you and your health needs are our priority.  As part of our continuing mission to provide you with exceptional heart care, we have created designated Provider Care Teams.  These Care Teams include your primary Cardiologist (physician) and Advanced Practice Providers (APPs -  Physician Assistants and Nurse Practitioners) who all work together to provide you with the care you need, when you need it.  We recommend signing up for the patient portal called "MyChart".  Sign up information is provided on this After Visit Summary.  MyChart is used to connect with patients for Virtual Visits (Telemedicine).  Patients are able to view lab/test results, encounter notes, upcoming appointments, etc.  Non-urgent messages can be sent to your provider as well.   To learn more about what you can do with MyChart, go to [email or web address].    Your next appointment:   6 month(s)  The format for your next appointment:   In Person  Provider:   Dr. Johney Frame   Important Information About Sugar        I,Mathew Stumpf,acting as a scribe for Freada Bergeron, MD.,have documented all relevant documentation on the behalf of Freada Bergeron, MD,as directed by  Freada Bergeron, MD while in the presence of Freada Bergeron, MD.  I, Freada Bergeron, MD, have reviewed all documentation for this visit. The documentation on 11/07/21 for the exam, diagnosis, procedures, and orders are all accurate and complete.  Signed, Freada Bergeron, MD  11/07/2021 1:04 PM    [place]

## 2021-11-07 NOTE — Patient Instructions (Signed)
Medication Instructions:   Your physician recommends that you continue on your current medications as directed. Please refer to the Current Medication list given to you today.  *If you need a refill on your cardiac medications before your next appointment, please call your pharmacy*   Follow-Up: At [facility], you and your health needs are our priority.  As part of our continuing mission to provide you with exceptional heart care, we have created designated Provider Care Teams.  These Care Teams include your primary Cardiologist (physician) and Advanced Practice Providers (APPs -  Physician Assistants and Nurse Practitioners) who all work together to provide you with the care you need, when you need it.  We recommend signing up for the patient portal called "MyChart".  Sign up information is provided on this After Visit Summary.  MyChart is used to connect with patients for Virtual Visits (Telemedicine).  Patients are able to view lab/test results, encounter notes, upcoming appointments, etc.  Non-urgent messages can be sent to your provider as well.   To learn more about what you can do with MyChart, go to [email or web address].    Your next appointment:   6 month(s)  The format for your next appointment:   In Person  Provider:   Dr. Pemberton   Important Information About Sugar       

## 2021-11-08 ENCOUNTER — Ambulatory Visit
Admission: RE | Admit: 2021-11-08 | Discharge: 2021-11-08 | Disposition: A | Discharge status: Home / Self Care | Payer: Medicare Other | Source: Ambulatory Visit | Attending: Radiation Oncology | Admitting: Radiation Oncology

## 2021-11-08 ENCOUNTER — Encounter: Payer: Self-pay | Admitting: Nutrition

## 2021-11-08 ENCOUNTER — Other Ambulatory Visit: Payer: Self-pay

## 2021-11-08 ENCOUNTER — Inpatient Hospital Stay: Payer: Medicare Other | Admitting: Nutrition

## 2021-11-08 DIAGNOSIS — Z510 Encounter for antineoplastic radiation therapy: Secondary | ICD-10-CM | POA: Diagnosis not present

## 2021-11-08 DIAGNOSIS — C4A0 Merkel cell carcinoma of lip: Secondary | ICD-10-CM | POA: Diagnosis not present

## 2021-11-08 LAB — RAD ONC ARIA SESSION SUMMARY
Course Elapsed Days: 0
Plan Fractions Treated to Date: 1
Plan Fractions Treated to Date: 1
Plan Prescribed Dose Per Fraction: 2 Gy
Plan Prescribed Dose Per Fraction: 2.3 Gy
Plan Total Fractions Prescribed: 20
Plan Total Fractions Prescribed: 20
Plan Total Prescribed Dose: 40 Gy
Plan Total Prescribed Dose: 46 Gy
Reference Point Dosage Given to Date: 2 Gy
Reference Point Dosage Given to Date: 2.3 Gy
Reference Point Session Dosage Given: 2 Gy
Reference Point Session Dosage Given: 2.3 Gy
Session Number: 1

## 2021-11-08 NOTE — Progress Notes (Signed)
Patient did not show up for nutrition appointment. 

## 2021-11-09 ENCOUNTER — Ambulatory Visit
Admission: RE | Admit: 2021-11-09 | Discharge: 2021-11-09 | Disposition: A | Discharge status: Home / Self Care | Payer: Medicare Other | Source: Ambulatory Visit | Attending: Radiation Oncology | Admitting: Radiation Oncology

## 2021-11-09 ENCOUNTER — Other Ambulatory Visit: Payer: Self-pay

## 2021-11-09 DIAGNOSIS — Z510 Encounter for antineoplastic radiation therapy: Secondary | ICD-10-CM | POA: Diagnosis not present

## 2021-11-09 DIAGNOSIS — C4A0 Merkel cell carcinoma of lip: Secondary | ICD-10-CM | POA: Diagnosis not present

## 2021-11-09 LAB — RAD ONC ARIA SESSION SUMMARY
Course Elapsed Days: 1
Plan Fractions Treated to Date: 2
Plan Prescribed Dose Per Fraction: 2.3 Gy
Plan Total Fractions Prescribed: 20
Plan Total Prescribed Dose: 46 Gy
Reference Point Dosage Given to Date: 4.6 Gy
Reference Point Session Dosage Given: 2.3 Gy
Session Number: 2

## 2021-11-09 NOTE — Progress Notes (Signed)
Oncology Nurse Navigator Documentation   To provide support, encouragement and care continuity, met with Tanya Mitchell after her second RT.   Tanya Mitchell  completed treatment without difficulty, denied questions/concerns yesterday and today.  I reviewed the registration/arrival procedure for subsequent treatments. I encouraged her to call me with questions/concerns as tmts proceed.   Harlow Asa RN, BSN, OCN Head & Neck Oncology Nurse [place] [facility] Phone # [phone]  Fax # [phone]

## 2021-11-10 ENCOUNTER — Ambulatory Visit
Admission: RE | Admit: 2021-11-10 | Discharge: 2021-11-10 | Disposition: A | Discharge status: Home / Self Care | Payer: Medicare Other | Source: Ambulatory Visit | Attending: Radiation Oncology | Admitting: Radiation Oncology

## 2021-11-10 ENCOUNTER — Other Ambulatory Visit: Payer: Self-pay

## 2021-11-10 DIAGNOSIS — Z510 Encounter for antineoplastic radiation therapy: Secondary | ICD-10-CM | POA: Diagnosis not present

## 2021-11-10 DIAGNOSIS — C4A0 Merkel cell carcinoma of lip: Secondary | ICD-10-CM | POA: Diagnosis not present

## 2021-11-10 LAB — RAD ONC ARIA SESSION SUMMARY
Course Elapsed Days: 2
Plan Fractions Treated to Date: 3
Plan Fractions Treated to Date: 3
Plan Prescribed Dose Per Fraction: 2 Gy
Plan Prescribed Dose Per Fraction: 2.3 Gy
Plan Total Fractions Prescribed: 20
Plan Total Fractions Prescribed: 20
Plan Total Prescribed Dose: 40 Gy
Plan Total Prescribed Dose: 46 Gy
Reference Point Dosage Given to Date: 6 Gy
Reference Point Dosage Given to Date: 6.9 Gy
Reference Point Session Dosage Given: 2 Gy
Reference Point Session Dosage Given: 2.3 Gy
Session Number: 3

## 2021-11-11 ENCOUNTER — Other Ambulatory Visit: Payer: Self-pay | Admitting: Radiation Therapy

## 2021-11-11 ENCOUNTER — Ambulatory Visit: Payer: Medicare Other

## 2021-11-11 ENCOUNTER — Ambulatory Visit
Admission: RE | Admit: 2021-11-11 | Discharge: 2021-11-11 | Disposition: A | Discharge status: Home / Self Care | Payer: Medicare Other | Source: Ambulatory Visit | Attending: Radiation Oncology | Admitting: Radiation Oncology

## 2021-11-11 ENCOUNTER — Other Ambulatory Visit: Payer: Self-pay

## 2021-11-11 DIAGNOSIS — C4A30 Merkel cell carcinoma of unspecified part of face: Secondary | ICD-10-CM

## 2021-11-11 DIAGNOSIS — Z510 Encounter for antineoplastic radiation therapy: Secondary | ICD-10-CM | POA: Diagnosis not present

## 2021-11-11 DIAGNOSIS — C4A0 Merkel cell carcinoma of lip: Secondary | ICD-10-CM | POA: Diagnosis not present

## 2021-11-11 LAB — RAD ONC ARIA SESSION SUMMARY
Course Elapsed Days: 3
Plan Fractions Treated to Date: 4
Plan Fractions Treated to Date: 4
Plan Prescribed Dose Per Fraction: 2 Gy
Plan Prescribed Dose Per Fraction: 2.3 Gy
Plan Total Fractions Prescribed: 20
Plan Total Fractions Prescribed: 20
Plan Total Prescribed Dose: 40 Gy
Plan Total Prescribed Dose: 46 Gy
Reference Point Dosage Given to Date: 8 Gy
Reference Point Dosage Given to Date: 9.2 Gy
Reference Point Session Dosage Given: 2 Gy
Reference Point Session Dosage Given: 2.3 Gy
Session Number: 4

## 2021-11-14 ENCOUNTER — Inpatient Hospital Stay (HOSPITAL_BASED_OUTPATIENT_CLINIC_OR_DEPARTMENT_OTHER): Payer: Medicare Other | Admitting: Hematology and Oncology

## 2021-11-14 ENCOUNTER — Other Ambulatory Visit: Payer: Self-pay | Admitting: *Deleted

## 2021-11-14 ENCOUNTER — Inpatient Hospital Stay: Payer: Medicare Other

## 2021-11-14 ENCOUNTER — Ambulatory Visit: Admission: RE | Admit: 2021-11-14 | Payer: Medicare Other | Source: Ambulatory Visit

## 2021-11-14 ENCOUNTER — Encounter: Payer: Self-pay | Admitting: Hematology and Oncology

## 2021-11-14 ENCOUNTER — Ambulatory Visit
Admission: RE | Admit: 2021-11-14 | Discharge: 2021-11-14 | Disposition: A | Discharge status: Home / Self Care | Payer: Medicare Other | Source: Ambulatory Visit | Attending: Radiation Oncology | Admitting: Radiation Oncology

## 2021-11-14 ENCOUNTER — Other Ambulatory Visit: Payer: Self-pay

## 2021-11-14 ENCOUNTER — Inpatient Hospital Stay: Payer: Medicare Other | Attending: Oncology

## 2021-11-14 VITALS — BP 113/58 | HR 100 | Temp 98.2°F | Resp 18

## 2021-11-14 VITALS — BP 113/57 | HR 62 | Temp 97.8°F | Wt 96.8 lb

## 2021-11-14 DIAGNOSIS — C4A0 Merkel cell carcinoma of lip: Secondary | ICD-10-CM | POA: Diagnosis not present

## 2021-11-14 DIAGNOSIS — D46Z Other myelodysplastic syndromes: Secondary | ICD-10-CM | POA: Insufficient documentation

## 2021-11-14 DIAGNOSIS — D471 Chronic myeloproliferative disease: Secondary | ICD-10-CM

## 2021-11-14 DIAGNOSIS — D469 Myelodysplastic syndrome, unspecified: Secondary | ICD-10-CM

## 2021-11-14 DIAGNOSIS — D649 Anemia, unspecified: Secondary | ICD-10-CM

## 2021-11-14 DIAGNOSIS — C4A9 Merkel cell carcinoma, unspecified: Secondary | ICD-10-CM | POA: Diagnosis not present

## 2021-11-14 DIAGNOSIS — R531 Weakness: Secondary | ICD-10-CM | POA: Diagnosis not present

## 2021-11-14 LAB — RAD ONC ARIA SESSION SUMMARY
Course Elapsed Days: 6
Plan Fractions Treated to Date: 5
Plan Fractions Treated to Date: 5
Plan Prescribed Dose Per Fraction: 2 Gy
Plan Prescribed Dose Per Fraction: 2.3 Gy
Plan Total Fractions Prescribed: 20
Plan Total Fractions Prescribed: 20
Plan Total Prescribed Dose: 40 Gy
Plan Total Prescribed Dose: 46 Gy
Reference Point Dosage Given to Date: 10 Gy
Reference Point Dosage Given to Date: 11.5 Gy
Reference Point Session Dosage Given: 2 Gy
Reference Point Session Dosage Given: 2.3 Gy
Session Number: 5

## 2021-11-14 LAB — CBC WITH DIFFERENTIAL (CANCER CENTER ONLY)
Abs Immature Granulocytes: 1 10*3/uL — ABNORMAL HIGH (ref 0.00–0.07)
Band Neutrophils: 2 %
Basophils Absolute: 0 10*3/uL (ref 0.0–0.1)
Basophils Relative: 0 %
Eosinophils Absolute: 0 10*3/uL (ref 0.0–0.5)
Eosinophils Relative: 0 %
HCT: 20.8 % — ABNORMAL LOW (ref 36.0–46.0)
Hemoglobin: 6.9 g/dL — CL (ref 12.0–15.0)
Lymphocytes Relative: 4 %
Lymphs Abs: 1.4 10*3/uL (ref 0.7–4.0)
MCH: 32.2 pg (ref 26.0–34.0)
MCHC: 33.2 g/dL (ref 30.0–36.0)
MCV: 97.2 fL (ref 80.0–100.0)
Metamyelocytes Relative: 1 %
Monocytes Absolute: 0.7 10*3/uL (ref 0.1–1.0)
Monocytes Relative: 2 %
Myelocytes: 2 %
Neutro Abs: 30.8 10*3/uL — ABNORMAL HIGH (ref 1.7–7.7)
Neutrophils Relative %: 89 %
Platelet Count: 20 10*3/uL — ABNORMAL LOW (ref 150–400)
RBC: 2.14 MIL/uL — ABNORMAL LOW (ref 3.87–5.11)
RDW: 20.8 % — ABNORMAL HIGH (ref 11.5–15.5)
WBC Count: 33.9 10*3/uL — ABNORMAL HIGH (ref 4.0–10.5)
nRBC: 0 % (ref 0.0–0.2)

## 2021-11-14 LAB — CMP (CANCER CENTER ONLY)
ALT: 36 U/L (ref 0–44)
AST: 39 U/L (ref 15–41)
Albumin: 4 g/dL (ref 3.5–5.0)
Alkaline Phosphatase: 161 U/L — ABNORMAL HIGH (ref 38–126)
Anion gap: 9 (ref 5–15)
BUN: 32 mg/dL — ABNORMAL HIGH (ref 8–23)
CO2: 26 mmol/L (ref 22–32)
Calcium: 9.7 mg/dL (ref 8.9–10.3)
Chloride: 100 mmol/L (ref 98–111)
Creatinine: 0.57 mg/dL (ref 0.44–1.00)
GFR, Estimated: >60 mL/min (ref >60)
Glucose, Bld: 237 mg/dL — ABNORMAL HIGH (ref 70–99)
Potassium: 4.4 mmol/L (ref 3.5–5.1)
Sodium: 135 mmol/L (ref 135–145)
Total Bilirubin: 0.5 mg/dL (ref 0.3–1.2)
Total Protein: 6.8 g/dL (ref 6.5–8.1)

## 2021-11-14 LAB — SAMPLE TO BLOOD BANK

## 2021-11-14 LAB — PREPARE RBC (CROSSMATCH)

## 2021-11-14 MED ORDER — SODIUM CHLORIDE 0.9% IV SOLUTION
250.0000 mL | Freq: Once | INTRAVENOUS | Status: AC
  Administered 2021-11-14: 250 mL via INTRAVENOUS

## 2021-11-14 MED ORDER — DARBEPOETIN ALFA 300 MCG/0.6ML IJ SOSY
300.0000 ug | PREFILLED_SYRINGE | Freq: Once | INTRAMUSCULAR | Status: AC
  Administered 2021-11-14: 300 ug via SUBCUTANEOUS
  Filled 2021-11-14: qty 0.6

## 2021-11-14 MED ORDER — ACETAMINOPHEN 325 MG PO TABS
650.0000 mg | ORAL_TABLET | Freq: Once | ORAL | Status: AC
  Administered 2021-11-14: 650 mg via ORAL
  Filled 2021-11-14: qty 2

## 2021-11-14 NOTE — Patient Instructions (Signed)

## 2021-11-14 NOTE — Progress Notes (Signed)
ID: Keith Rake Errickson OB: May 18, 1945  MR#: [id number]  CSN#:[id number]  Patient Care Team: Benay Pike, MD as PCP - General (Hematology and Oncology) Bobbye Charleston, MD as Consulting Physician (Obstetrics and Gynecology) Janyth Contes, MD as Referring Physician (Hematology and Oncology) Francina Ames, MD as Referring Physician (Otolaryngology) Malmfelt, Stephani Police, RN as Oncology Nurse Navigator Eppie Gibson, MD as Attending Physician (Radiation Oncology)   CHIEF COMPLAINTS:   1)  Hx Left Breast Cancer 1995 (s/p left mastectomy)       2) Hx Right Breast Cancer 2007                   3)  Hx Melanoma, Stage IIIA       4) MPN with leukocytosis/thrombocytosis      CURRENT TREATMENT: hydrea; aranesp; allopurinol  INTERVAL HISTORY: Jazman returns today for follow-up of her MPN and newly diagnosed Merkel cell carcinoma. She is now undergoing adjuvant radiation for Merkel cell.  She reports double vision but started around a week ago.  She has an MRI brain scheduled for tomorrow.  She otherwise has been eating better, doing well.  She does feel very fatigued overall. She will continue with every other week labs and darbepoetin injections.  Rest of the pertinent 10 point ROS reviewed and negative    COVID 19 VACCINATION STATUS: [facility] x2, most recently 04/2019; had COVID May 2022   HISTORY OF PRESENT ILLNESS: From the earlier summary:  Ryllie's history of breast cancer dates back to June of 1995, when she had left modified radical mastectomy under [facility] for a pT1c pN1, stage IIA invasive ductal carcinoma, grade 2, estrogen receptor 97% positive, progesterone receptor 45% positive, treated according to CALGB 9394, sequential arm (doxorubicin x3, fourth dose apparently omitted, followed by high-dose cyclophosphamide x3), followed by tamoxifen for 5 years. There has been no evidence of disease recurrence.  Further, in October of 2007 she underwent right lumpectomy for a ductal  carcinoma in situ, high-grade, estrogen and progesterone receptor negative, followed by adjuvant radiation.  In July of 2013 the patient was noted to have an irregular mole in her left upper back. It is not clear to me whether this might be related to her prior left breast irradiation. Shave biopsy of this area 09/15/2011 by Dr. Derrel Nip [phone]) showed a superficial spreading malignant melanoma, with Breslow depth 1.45 mm, Clark's level IV. There was brisk host response and 8 mitoses per high power field were noted. There was focal regression but no definitive ulceration. There was no satellitosis. There was no vascular and urgent. Margins were positive, but cleared by wide excision under Dr Stark Klein 10/10/2011. There was some residual malignant melanoma in situ but margins to 2 cm were obtained, with advancement flap closure of the skin defect. In addition left axillary sentinel lymph node mapping was performed showing metastatic melanoma in the single lymph node removed.  Karagan sought a second opinion at [facility] under Dr Joaquim Lai Collichio. There was a full discussion regarding completion nodal resection, use of adjuvant interferon, and BRAF testing with a view to participation in a research protocol then available. The patient considered all these options and after much discussion the decision was made not to pursue full nodal dissection, partly because the area in question had a ready undergone surgery and radiation. The patient opted against interferon adjuvant therapy because of its very marginal benefit and significant side effects. She declined consideration of a research study and BRAF testing was not performed.  Her  subsequent history is as detailed below.   PAST MEDICAL HISTORY: Past Medical History:  Diagnosis Date   Breast cancer ([place]) 1995 and 2007   Cardiomyopathy secondary to chemotherapy [facility])    has improved    Hyperlipidemia    Hypertension    under control, has been on  med. x 3 yrs.   Melanoma in situ of back ([place]) 09/2011   left   Personal history of chemotherapy    Personal history of radiation therapy    Skin cancer 2013   Melanoma    PAST SURGICAL HISTORY: Past Surgical History:  Procedure Laterality Date   BREAST LUMPECTOMY Right 12/21/2005   right   MASTECTOMY Left 1995   with breast reconstruction - left   RIGHT/LEFT HEART CATH AND CORONARY ANGIOGRAPHY N/A 04/20/2021   Procedure: RIGHT/LEFT HEART CATH AND CORONARY ANGIOGRAPHY;  Surgeon: Jettie Booze, MD;  Location: [place] [facility];  Service: Cardiovascular;  Laterality: N/A;    FAMILY HISTORY Family History  Problem Relation Age of Onset   Lung cancer Father    Cancer Father        lung   Autism Son    Breast cancer Daughter    the patient's father died from complications of lung cancer at the age of 69. He was a heavy smoker. The patient's mother died at the age of 66. Kirti had no siblings. She underwent genetic testing for breast and ovarian cancer panel April of 2014. There were no demonstrable mutations in the BRCA or the other genes in the panel   GYNECOLOGIC HISTORY:  Menarche age 47, first live birth age 71. She is GX P2. She entered menopause at age 37, when she received her chemotherapy. She did not use hormone replacement. She did use birth control remotely, for approximately 14 years, with no complications.   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  They recently me moved to a new home in the [facility] area.  Jannae's son [name] is handicapped, and works as an Training and development officer. [name] is Brewing technologist limited. Damoni has 3 grandchildren and [place] 2. Yanni grew up in an Database administrator  denomination   ADVANCED DIRECTIVES: In place   HEALTH MAINTENANCE:  Social History   Tobacco Use   Smoking status: Former    Types: Cigarettes    Quit date: 02/14/1964    Years since quitting: 57.7   Smokeless tobacco: Never  Vaping Use   Vaping Use: Never used  Substance Use Topics   Alcohol use: Yes    Comment: daily glass of wine   Drug use: No     Colonoscopy: 2011  PAP: November 2014  Bone density: January 2013, osteopenia  Lipid panel:   August 2013/Dr. Drema Dallas   No Known Allergies  Current Outpatient Medications  Medication Sig Dispense Refill   allopurinol (ZYLOPRIM) 300 MG tablet Take 1 tablet (300 mg total) by mouth daily. 60 tablet 6   aspirin EC 81 MG tablet Take 1 tablet (81 mg total) by mouth daily. Swallow whole. 90 tablet 3   atorvastatin (LIPITOR) 80 MG tablet Take 1 tablet (80 mg total) by mouth daily. 90 tablet 3   cholecalciferol (VITAMIN D) 1000 UNITS tablet Take 2,000 Units by mouth in  the morning.     dapagliflozin propanediol (FARXIGA) 10 MG TABS tablet Take 1 tablet (10 mg total) by mouth daily before breakfast. 90 tablet 3   furosemide (LASIX) 40 MG tablet Take 1 tablet (40 mg total) by mouth daily as needed. 90 tablet 1   hydroxyurea (HYDREA) 500 MG capsule Take 500 mg by mouth in the morning and at bedtime. In the evening. May take with food to minimize GI side effects.     Magnesium 400 MG TABS Take 1 tablet by mouth daily as needed. Only take on days taking furosemide 90 tablet 1   metFORMIN (GLUCOPHAGE) 500 MG tablet Take 0.5 tablets (250 mg total) by mouth in the morning and at bedtime.     metoprolol succinate (TOPROL XL) 25 MG 24 hr tablet Take 0.5 tablets (12.5 mg total) by mouth at bedtime. Please hold this med if systolic BP is 90 or less. 45 tablet 2   potassium chloride SA (KLOR-CON M) 20 MEQ tablet Take 1 tablet (20 mEq total) by mouth daily as needed. Only on days taking furosemide 90 tablet 1   sacubitril-valsartan (ENTRESTO) 24-26 MG  Take 1 tablet by mouth 2 (two) times daily. 180 tablet 3   sodium fluoride (SODIUM FLUORIDE 5000 PLUS) 1.1 % CREA dental cream Place pea-size amount onto toothbrush every evening before bedtime to brush teeth. Do NOT rinse with water, eat or drink for at least 30 minutes after use. 51 g 4   triamcinolone (KENALOG) 0.025 % ointment Apply topically as needed.     No current facility-administered medications for this visit.    OBJECTIVE: White woman who appears younger than stated age  26:   11/14/21 1144  BP: (!) 113/57  Pulse: 62  Temp: 97.8 F (36.6 C)  SpO2: 100%       Body mass index is 17.7 kg/m.    ECOG FS:1 - Symptomatic but completely ambulatory Filed Weights   11/14/21 1144  Weight: 96 lb 12.8 oz (43.9 kg)    Physical Exam Constitutional:      Comments: She appears pale today  HENT:     Head:     Comments: Surgical scar noted lower lip as well as the neck Musculoskeletal:        General: No swelling.  Skin:    General: Skin is warm and dry.  Neurological:     General: No focal deficit present.     Mental Status: She is alert.    LAB RESULTS:   Lab Results  Component Value Date   WBC 33.9 (H) 11/14/2021   NEUTROABS 30.8 (H) 11/14/2021   HGB 6.9 (LL) 11/14/2021   HCT 20.8 (L) 11/14/2021   MCV 97.2 11/14/2021   PLT 20 (L) 11/14/2021      Chemistry      Component Value Date/Time   NA 135 11/14/2021 1126   NA 138 04/25/2021 1018   NA 139 11/30/2015 1225   K 4.4 11/14/2021 1126   K 4.6 11/30/2015 1225   CL 100 11/14/2021 1126   CL 104 01/18/2012 0948   CO2 26 11/14/2021 1126   CO2 27 11/30/2015 1225   BUN 32 (H) 11/14/2021 1126   BUN 22 04/25/2021 1018   BUN 13.1 11/30/2015 1225   CREATININE 0.57 11/14/2021 1126   CREATININE 0.7 11/30/2015 1225      Component Value Date/Time   CALCIUM 9.7 11/14/2021 1126   CALCIUM 10.0 11/30/2015 1225   ALKPHOS 161 (H) 11/14/2021 1126  ALKPHOS 83 11/30/2015 1225   AST 39 11/14/2021 1126   AST 25  11/30/2015 1225   ALT 36 11/14/2021 1126   ALT 27 11/30/2015 1225   BILITOT 0.5 11/14/2021 1126   BILITOT 0.60 11/30/2015 1225      STUDIES: ECHOCARDIOGRAM COMPLETE  Result Date: 10/25/2021    ECHOCARDIOGRAM REPORT   Patient Name:   Berkeley Veldman Taddeo Date of Exam: 10/25/2021 Medical Rec #:  [id number]            Height:       62.0 in Accession #:    [id number]           Weight:       96.0 lb Date of Birth:  23-May-1945            BSA:          1.399 m Patient Age:    76 years             BP:           78/43 mmHg Patient Gender: F                    HR:           107 bpm. Exam Location:  [place] Procedure: 2D Echo, Color Doppler and Cardiac Doppler Indications:    Heart Failure I50.22  History:        Patient has prior history of Echocardiogram examinations, most                 recent 03/29/2021. Cardiomyopathy secondary to chemotherapy; Risk                 Factors:Hypertension and Dyslipidemia.  Sonographer:    Mikki Santee RDCS Referring Phys: Greer Ee [place] IMPRESSIONS  1. Left ventricular ejection fraction, by estimation, is 40 to 45%. The left ventricle has mildly decreased function. The left ventricle has no regional wall motion abnormalities. Left ventricular diastolic parameters are consistent with Grade I diastolic dysfunction (impaired relaxation).  2. Right ventricular systolic function is normal. The right ventricular size is normal. There is normal pulmonary artery systolic pressure. The estimated right ventricular systolic pressure is 28.3 mmHg.  3. The mitral valve is normal in structure. No evidence of mitral valve regurgitation. No evidence of mitral stenosis.  4. The aortic valve is normal in structure. Aortic valve regurgitation is not visualized. No aortic stenosis is present.  5. The inferior vena cava is normal in size with greater than 50% respiratory variability, suggesting right atrial pressure of 3 mmHg. Comparison(s): Prior EF 25-30%. FINDINGS  Left Ventricle:  Left ventricular ejection fraction, by estimation, is 40 to 45%. The left ventricle has mildly decreased function. The left ventricle has no regional wall motion abnormalities. The left ventricular internal cavity size was normal in size. There is no left ventricular hypertrophy. Left ventricular diastolic parameters are consistent with Grade I diastolic dysfunction (impaired relaxation).  LV Wall Scoring: The inferior wall is akinetic. Right Ventricle: The right ventricular size is normal. No increase in right ventricular wall thickness. Right ventricular systolic function is normal. There is normal pulmonary artery systolic pressure. The tricuspid regurgitant velocity is 2.39 m/s, and  with an assumed right atrial pressure of 3 mmHg, the estimated right ventricular systolic pressure is 66.2 mmHg. Left Atrium: Left atrial size was normal in size. Right Atrium: Right atrial size was normal in size. Pericardium: There is no evidence of pericardial effusion. Mitral Valve: The  mitral valve is normal in structure. No evidence of mitral valve regurgitation. No evidence of mitral valve stenosis. Tricuspid Valve: The tricuspid valve is normal in structure. Tricuspid valve regurgitation is trivial. No evidence of tricuspid stenosis. Aortic Valve: The aortic valve is normal in structure. Aortic valve regurgitation is not visualized. No aortic stenosis is present. Pulmonic Valve: The pulmonic valve was normal in structure. Pulmonic valve regurgitation is trivial. No evidence of pulmonic stenosis. Aorta: The aortic root is normal in size and structure. Venous: The inferior vena cava is normal in size with greater than 50% respiratory variability, suggesting right atrial pressure of 3 mmHg. IAS/Shunts: No atrial level shunt detected by color flow Doppler.  LEFT VENTRICLE PLAX 2D LVIDd:         4.60 cm     Diastology LVIDs:         3.70 cm     LV e' lateral:   6.53 cm/s LV PW:         1.00 cm     LV E/e' lateral: 10.8 LV IVS:         1.00 cm LVOT diam:     2.10 cm LV SV:         42 LV SV Index:   30 LVOT Area:     3.46 cm  LV Volumes (MOD) LV vol d, MOD A2C: 75.6 ml LV vol d, MOD A4C: 92.2 ml LV vol s, MOD A2C: 41.9 ml LV vol s, MOD A4C: 55.4 ml LV SV MOD A2C:     33.7 ml LV SV MOD A4C:     92.2 ml LV SV MOD BP:      37.8 ml RIGHT VENTRICLE RV Basal diam:  2.40 cm RV Mid diam:    3.00 cm RV S prime:     13.80 cm/s TAPSE (M-mode): 1.8 cm LEFT ATRIUM             Index        RIGHT ATRIUM          Index LA diam:        3.50 cm 2.50 cm/m   RA Area:     8.16 cm LA Vol (A2C):   62.3 ml 44.52 ml/m  RA Volume:   13.80 ml 9.86 ml/m LA Vol (A4C):   33.7 ml 24.08 ml/m LA Biplane Vol: 48.4 ml 34.59 ml/m  AORTIC VALVE LVOT Vmax:   79.70 cm/s LVOT Vmean:  52.400 cm/s LVOT VTI:    0.120 m  AORTA Ao Root diam: 3.00 cm Ao Asc diam:  3.40 cm MITRAL VALVE               TRICUSPID VALVE MV Area (PHT): 3.34 cm    TR Peak grad:   22.8 mmHg MV Decel Time: 227 msec    TR Vmax:        239.00 cm/s MV E velocity: 70.70 cm/s MV A velocity: 99.00 cm/s  SHUNTS MV E/A ratio:  0.71        Systemic VTI:  0.12 m                            Systemic Diam: 2.10 cm Candee Furbish MD Electronically signed by Candee Furbish MD Signature Date/Time: 10/25/2021/1:57:48 PM    Final      ASSESSMENT: 76 y.o. BRCA negative [place] woman   (1) status post left mastectomy with TRAM reconstruction June of 1995 for a T1c  N1, stage IIA invasive ductal carcinoma, grade 2, estrogen receptor 97% positive, progesterone receptor 45% positive, treated adjuvantly according to CALGB 9394 with 3 cycles of doxorubicin followed by 3 cycles of cyclophosphamide, then tamoxifen for 5 years, off therapy completed November of 2000  (2) status post right lumpectomy October 2007 for high-grade ductal carcinoma in situ, with negative margins estrogen and progesterone receptor negative, followed by adjuvant radiation therapy   (3) malignant melanoma, T2a N1a = stage IIIA, as follows:  (a) status  post shave biopsy from the right upper back 09/15/2011 for a superficial spreading melanoma, Breslow depth 1.45 mm, Clark's level IV, with focal regression but no ulceration.  (b) status post wide excision and sentinel lymph node sampling 10/10/2011 with residual malignant melanoma in situ, but negative margins; the single sentinel lymph node was involved by melanoma with the largest subcapsular deposit measuring 0.22 mm, no evidence of capsular involvement or extracapsular extension  (c) completion nodal dissection was discussed, but not performed in part because the patient had already had a complete left axillary lymph node dissection at the time of her 1995 left mastectomy (10 lymph nodes were removed at that time)  (d) adjuvant interferon was discussed with the patient when she visited [facility] in September 2013, but given that it side effects and marginal benefits the patient declined  (e) BRAF testing has not been done on the original tumor; this was also discussed with the patient at the time of her [facility] visit, as it might possibly lead to participation in a research protocol; the patient decided not to pursue that option  (4) PET scan 11/19/18/2015 shows nonspecific uptake only at T6.   (a) MRI of the thoracic spine November 2015 showed no evidence of metastatic disease  (b) repeat PET scan 12/02/2014 shows no residual or recurrent hypermetabolic tumor.  (5) thrombocytosis first noted October 2017, leukocytosis first noted November 2019  (a) bone marrow biopsy 04/03/2018 showed a hypercellular bone marrow with granulocytic and megakaryocytic proliferation, some of the megakaryocytes being small and/or hypolobulated.  There was no increase in blasts, no significant increase in reticulin fibers  (b) cytogenetics from bone marrow biopsy 04/03/2018 showed 46,XX[20].nuc ish (ABL1, BCR)x2  (c) molecular studies 12/18/2017 showed no mutations in JAK2 exons 12 and 14 (including V 617);  MPL exon 10 or CALR  exon 9  (d) BCR/ABL 1 drawn 12/18/2017 showed 94% normal nuclei (6% with single fusion)  (e) repeat BCR/ABL 1 on 04/30/2018 showed 100% normal nuclei  (f) repeat BCR/ABL 1 on 02/27/2019: Translocation not detected  (6) bone marrow biopsy on 10/31/2019 shows a hypercellular marrow with myeloid and megakaryocytic hyperplasia but no increase in blasts.  (a) cytogenetics showed no metaphase cells for analysis  (b) Next generation sequencing for myeloid disorders reveals mutations of  ASXL1, GATA2, and U2AF1. These findings support the impression of a  myeloid neoplasm.    (c) hydroxyurea 500 mg daily started March 2022 with Aranesp support every 4 weeks  (d) hydroxyurea held on 10/25/2020 secondary to progressive anemia, requiring transfusion; EPO increased to every 2 weeks  (e) hydroxyurea currently at 500 mg  daily  PLAN:  She is currently taking Hydrea and darbepoetin as instructed by Dr. Mortimer Fries.  Hemoglobin today 6.9, will arrange for transfusion.  Platelet count also is 20,000.  We may have to transition to weekly labs at this point.  She tells me she has a telephone appointment with Dr. Joan Mayans coming up.  We will await for recommendations.  Now  with regards to newly diagnosed Merkel cell status post complete resection, we have discussed about considering adjuvant immunotherapy.  There is an open label phase 2 trial but completely resected [facility] who were randomly assigned to use 1 year of adjuvant nivolumab versus surveillance.  Disease-free survival rate of 24 months with 84% with nivolumab versus 73% with observation only.  Overall survival data is immature.  I have also discussed the PET/CT results which showed multiple tiny pulmonary nodules below PET/CT resolution however metastasis cannot be excluded  I believe she is a very high risk for recurrence or for advanced metastatic disease hence have strongly recommended considering adjuvant immunotherapy.  She will proceed with adjuvant radiation  and then will start adjuvant immunotherapy with nivolumab for 480 mg every 4 weeks.  We have briefly discussed today about mechanism of action of immunotherapy and adverse effects and rare incidence of life-threatening and severe adverse effects.  We have also discussed the aggressive nature of Merkel cell carcinoma and prognosis briefly.  She also complains of double vision today hence I agree with MRI brain.  She is scheduled for this tomorrow.  She will continue labs weekly, Darbopoetin and return to clinic to follow-up with me at the end of October to initiate immunotherapy.  Her hemoglobin today is only 6.9, we will arrange for 2 units of packed red blood cells.  Total encounter time 30 minutes.Benay Pike, MD   11/14/2021 1:10 PM  Oncology and Hematology [facility] [place], [place] [address] Tel. [phone]  Joylene Igo [phone]   *Total Encounter Time as defined by the Centers for Medicare and Medicaid Services includes, in addition to the face-to-face time of a patient visit (documented in the note above) non-face-to-face time: obtaining and reviewing outside history, ordering and reviewing medications, tests or procedures, care coordination (communications with other health care professionals or caregivers) and documentation in the medical record.

## 2021-11-15 ENCOUNTER — Ambulatory Visit (HOSPITAL_COMMUNITY)
Admission: RE | Admit: 2021-11-15 | Discharge: 2021-11-15 | Disposition: A | Discharge status: Home / Self Care | Payer: Medicare Other | Source: Ambulatory Visit | Attending: Radiation Oncology | Admitting: Radiation Oncology

## 2021-11-15 ENCOUNTER — Inpatient Hospital Stay: Payer: Medicare Other

## 2021-11-15 ENCOUNTER — Other Ambulatory Visit: Payer: Self-pay | Admitting: Radiation Oncology

## 2021-11-15 ENCOUNTER — Ambulatory Visit
Admission: RE | Admit: 2021-11-15 | Discharge: 2021-11-15 | Disposition: A | Discharge status: Home / Self Care | Payer: Medicare Other | Source: Ambulatory Visit | Attending: Radiation Oncology | Admitting: Radiation Oncology

## 2021-11-15 ENCOUNTER — Other Ambulatory Visit: Payer: Self-pay | Admitting: *Deleted

## 2021-11-15 ENCOUNTER — Ambulatory Visit: Payer: Medicare Other

## 2021-11-15 ENCOUNTER — Other Ambulatory Visit: Payer: Self-pay

## 2021-11-15 DIAGNOSIS — C4A30 Merkel cell carcinoma of unspecified part of face: Secondary | ICD-10-CM | POA: Diagnosis not present

## 2021-11-15 DIAGNOSIS — C4A0 Merkel cell carcinoma of lip: Secondary | ICD-10-CM | POA: Diagnosis not present

## 2021-11-15 DIAGNOSIS — D649 Anemia, unspecified: Secondary | ICD-10-CM

## 2021-11-15 DIAGNOSIS — D46Z Other myelodysplastic syndromes: Secondary | ICD-10-CM | POA: Diagnosis not present

## 2021-11-15 DIAGNOSIS — H532 Diplopia: Secondary | ICD-10-CM | POA: Diagnosis not present

## 2021-11-15 LAB — RAD ONC ARIA SESSION SUMMARY
Course Elapsed Days: 7
Plan Fractions Treated to Date: 6
Plan Fractions Treated to Date: 6
Plan Prescribed Dose Per Fraction: 2 Gy
Plan Prescribed Dose Per Fraction: 2.3 Gy
Plan Total Fractions Prescribed: 20
Plan Total Fractions Prescribed: 20
Plan Total Prescribed Dose: 40 Gy
Plan Total Prescribed Dose: 46 Gy
Reference Point Dosage Given to Date: 12 Gy
Reference Point Dosage Given to Date: 13.8 Gy
Reference Point Session Dosage Given: 2 Gy
Reference Point Session Dosage Given: 2.3 Gy
Session Number: 6

## 2021-11-15 LAB — PREPARE RBC (CROSSMATCH)

## 2021-11-15 MED ORDER — SODIUM CHLORIDE 0.9% IV SOLUTION
250.0000 mL | Freq: Once | INTRAVENOUS | Status: AC
  Administered 2021-11-15: 250 mL via INTRAVENOUS

## 2021-11-15 MED ORDER — ACETAMINOPHEN 325 MG PO TABS
650.0000 mg | ORAL_TABLET | Freq: Once | ORAL | Status: AC
  Administered 2021-11-15: 650 mg via ORAL
  Filled 2021-11-15: qty 2

## 2021-11-15 MED ORDER — GADOPICLENOL 0.5 MMOL/ML IV SOLN
5.0000 mL | Freq: Once | INTRAVENOUS | Status: AC | PRN
  Administered 2021-11-15: 5 mL via INTRAVENOUS

## 2021-11-15 NOTE — Patient Instructions (Signed)

## 2021-11-16 ENCOUNTER — Other Ambulatory Visit: Payer: Self-pay | Admitting: Radiation Therapy

## 2021-11-16 ENCOUNTER — Other Ambulatory Visit: Payer: Self-pay

## 2021-11-16 ENCOUNTER — Inpatient Hospital Stay: Payer: Medicare Other | Admitting: Dietician

## 2021-11-16 ENCOUNTER — Ambulatory Visit: Admission: RE | Admit: 2021-11-16 | Payer: Medicare Other | Source: Ambulatory Visit

## 2021-11-16 ENCOUNTER — Ambulatory Visit
Admission: RE | Admit: 2021-11-16 | Discharge: 2021-11-16 | Disposition: A | Discharge status: Home / Self Care | Payer: Medicare Other | Source: Ambulatory Visit | Attending: Radiation Oncology | Admitting: Radiation Oncology

## 2021-11-16 DIAGNOSIS — C4A0 Merkel cell carcinoma of lip: Secondary | ICD-10-CM | POA: Diagnosis not present

## 2021-11-16 LAB — BPAM RBC
Blood Product Expiration Date: 202310132359
Blood Product Expiration Date: 202310182359
ISSUE DATE / TIME: 202310021408
ISSUE DATE / TIME: 202310031437
Unit Type and Rh: 600
Unit Type and Rh: 600

## 2021-11-16 LAB — TYPE AND SCREEN
ABO/RH(D): AB NEG
Antibody Screen: NEGATIVE
Unit division: 0
Unit division: 0

## 2021-11-16 LAB — RAD ONC ARIA SESSION SUMMARY
Course Elapsed Days: 8
Plan Fractions Treated to Date: 7
Plan Fractions Treated to Date: 7
Plan Prescribed Dose Per Fraction: 2 Gy
Plan Prescribed Dose Per Fraction: 2.3 Gy
Plan Total Fractions Prescribed: 20
Plan Total Fractions Prescribed: 20
Plan Total Prescribed Dose: 40 Gy
Plan Total Prescribed Dose: 46 Gy
Reference Point Dosage Given to Date: 14 Gy
Reference Point Dosage Given to Date: 16.1 Gy
Reference Point Session Dosage Given: 2 Gy
Reference Point Session Dosage Given: 2.3 Gy
Session Number: 7

## 2021-11-16 MED ORDER — SONAFINE EX EMUL
1.0000 | Freq: Once | CUTANEOUS | Status: AC
  Administered 2021-11-16: 1 via TOPICAL

## 2021-11-16 NOTE — Progress Notes (Signed)
Nutrition Assessment   Reason for Assessment: HNC   ASSESSMENT: 76 year old female with merkle cell carcinoma of lip. She is receiving adjuvant radiation under the care of Dr. Isidore Moos. Patient is currently receiving hydrea, allopurinol + aranesp injection q14d for myeloproliferative disorder.   Past medical history includes left breast cancer s/p mastectomy (1995), right breast cancer (2007), melanoma (2013),cardiomyopathy secondary to chemotherapy, HLD, HTN  Met with patient and husband in clinic after weekly UT with MD. She reports ongoing numbness to mandible s/p surgery 5 weeks ago. She is tolerating regular textures. Eating takes a long time. Recalls eating steak cut in very small bites recently. Husband has been working to increase calories at home to promote weight gain. Patient is drinking one Boost VHC (530 kcal, 23 g protein), one Equate Plus (330 kcal, 20 g protein) daily. Husband reports ordering calorie supplement from [employer] which provides 330 kcal (suspect benecalorie, but husband unsure). Patient does not like the way this taste. Reports flavor is strong which is not disguised well in foods. She denies altered taste, dry mouth/thick saliva, nausea, vomiting, diarrhea.    Nutrition Focused Physical Exam:  moderate muscle wasting - temples severe muscle wasting - clavicle, deltoid, gastrocnemius Severe fat depletion - buccal, orbital, thoracic    Medications: lipitor, vit D, lasix, metformin, toprol, klor-con, entresto   Labs: 10/2 - Hgb 6.9 (pending transfusion) glucose 237, BUN 32   Anthropometrics:   Height: 5'2" Weight: 97.8 lb (aria 10/4) UBW: 133 lb 8 oz (02/17/21) BMI: 17.9 (underweight)   NUTRITION DIAGNOSIS: Patient is underweight and meets criteria for severe malnutrition related to chronic illness as evidenced by moderate/severe fat and muscle depletions, 27% (36 lb) weight loss in 9 months which is severe for time frame   INTERVENTION:  Educated on  soft moist high protein foods for ease of intake - handout with ideas provided Encouraged eating small amounts frequently vs 3 meals daily Discussed ways to add calories to foods (switching to whole milk, using milk vs water in soups/oatmeal, adding cheese/gravy/butter to foods, adding Ensure to ice cream) - shake recipes provided Patient will try having high protein bedtime snack Continue Boost VHC + Ensure Plus/equivalent daily - coupons + samples of CIB breakfast powder packets provided  Patient is working to increase intake of water - 48 oz recommended per MD Contact information provided    MONITORING, EVALUATION, GOAL: Patient will tolerate increased calories and protein to promote weight gain   Next Visit: Wednesday October 11 after treatment

## 2021-11-17 ENCOUNTER — Ambulatory Visit
Admission: RE | Admit: 2021-11-17 | Discharge: 2021-11-17 | Disposition: A | Discharge status: Home / Self Care | Payer: Medicare Other | Source: Ambulatory Visit | Attending: Radiation Oncology | Admitting: Radiation Oncology

## 2021-11-17 ENCOUNTER — Other Ambulatory Visit: Payer: Self-pay

## 2021-11-17 DIAGNOSIS — D471 Chronic myeloproliferative disease: Secondary | ICD-10-CM | POA: Diagnosis not present

## 2021-11-17 DIAGNOSIS — C7B1 Secondary Merkel cell carcinoma: Secondary | ICD-10-CM | POA: Diagnosis not present

## 2021-11-17 DIAGNOSIS — E790 Hyperuricemia without signs of inflammatory arthritis and tophaceous disease: Secondary | ICD-10-CM | POA: Diagnosis not present

## 2021-11-17 DIAGNOSIS — C4A0 Merkel cell carcinoma of lip: Secondary | ICD-10-CM | POA: Diagnosis not present

## 2021-11-17 LAB — RAD ONC ARIA SESSION SUMMARY
Course Elapsed Days: 9
Plan Fractions Treated to Date: 8
Plan Fractions Treated to Date: 8
Plan Prescribed Dose Per Fraction: 2 Gy
Plan Prescribed Dose Per Fraction: 2.3 Gy
Plan Total Fractions Prescribed: 20
Plan Total Fractions Prescribed: 20
Plan Total Prescribed Dose: 40 Gy
Plan Total Prescribed Dose: 46 Gy
Reference Point Dosage Given to Date: 16 Gy
Reference Point Dosage Given to Date: 18.4 Gy
Reference Point Session Dosage Given: 2 Gy
Reference Point Session Dosage Given: 2.3 Gy
Session Number: 8

## 2021-11-18 ENCOUNTER — Ambulatory Visit: Payer: Medicare Other

## 2021-11-21 ENCOUNTER — Inpatient Hospital Stay: Payer: Medicare Other

## 2021-11-21 ENCOUNTER — Inpatient Hospital Stay: Payer: Medicare Other | Admitting: Hematology and Oncology

## 2021-11-21 ENCOUNTER — Ambulatory Visit: Payer: Medicare Other

## 2021-11-22 ENCOUNTER — Other Ambulatory Visit: Payer: Self-pay | Admitting: *Deleted

## 2021-11-22 ENCOUNTER — Inpatient Hospital Stay: Payer: Medicare Other

## 2021-11-22 ENCOUNTER — Inpatient Hospital Stay (HOSPITAL_BASED_OUTPATIENT_CLINIC_OR_DEPARTMENT_OTHER): Payer: Medicare Other | Admitting: Internal Medicine

## 2021-11-22 ENCOUNTER — Other Ambulatory Visit: Payer: Self-pay

## 2021-11-22 ENCOUNTER — Ambulatory Visit
Admission: RE | Admit: 2021-11-22 | Discharge: 2021-11-22 | Disposition: A | Discharge status: Home / Self Care | Payer: Medicare Other | Source: Ambulatory Visit | Attending: Radiation Oncology | Admitting: Radiation Oncology

## 2021-11-22 ENCOUNTER — Ambulatory Visit: Payer: Medicare Other

## 2021-11-22 ENCOUNTER — Telehealth: Payer: Self-pay

## 2021-11-22 DIAGNOSIS — R9089 Other abnormal findings on diagnostic imaging of central nervous system: Secondary | ICD-10-CM

## 2021-11-22 DIAGNOSIS — H4922 Sixth [abducent] nerve palsy, left eye: Secondary | ICD-10-CM | POA: Diagnosis not present

## 2021-11-22 DIAGNOSIS — D469 Myelodysplastic syndrome, unspecified: Secondary | ICD-10-CM

## 2021-11-22 DIAGNOSIS — H532 Diplopia: Secondary | ICD-10-CM

## 2021-11-22 DIAGNOSIS — D6481 Anemia due to antineoplastic chemotherapy: Secondary | ICD-10-CM

## 2021-11-22 DIAGNOSIS — C4A0 Merkel cell carcinoma of lip: Secondary | ICD-10-CM | POA: Diagnosis not present

## 2021-11-22 DIAGNOSIS — D46Z Other myelodysplastic syndromes: Secondary | ICD-10-CM | POA: Diagnosis not present

## 2021-11-22 LAB — CBC WITH DIFFERENTIAL (CANCER CENTER ONLY)
Abs Immature Granulocytes: 13.15 10*3/uL — ABNORMAL HIGH (ref 0.00–0.07)
Basophils Absolute: 0 10*3/uL (ref 0.0–0.1)
Basophils Relative: 0 %
Eosinophils Absolute: 0.1 10*3/uL (ref 0.0–0.5)
Eosinophils Relative: 0 %
HCT: 25.3 % — ABNORMAL LOW (ref 36.0–46.0)
Hemoglobin: 8.6 g/dL — ABNORMAL LOW (ref 12.0–15.0)
Immature Granulocytes: 20 %
Lymphocytes Relative: 5 %
Lymphs Abs: 3.1 10*3/uL (ref 0.7–4.0)
MCH: 33.5 pg (ref 26.0–34.0)
MCHC: 34 g/dL (ref 30.0–36.0)
MCV: 98.4 fL (ref 80.0–100.0)
Monocytes Absolute: 0.7 10*3/uL (ref 0.1–1.0)
Monocytes Relative: 1 %
Neutro Abs: 49.1 10*3/uL — ABNORMAL HIGH (ref 1.7–7.7)
Neutrophils Relative %: 74 %
Platelet Count: 14 10*3/uL — ABNORMAL LOW (ref 150–400)
RBC: 2.57 MIL/uL — ABNORMAL LOW (ref 3.87–5.11)
RDW: 18.8 % — ABNORMAL HIGH (ref 11.5–15.5)
Smear Review: NORMAL
WBC Count: 66.2 10*3/uL (ref 4.0–10.5)
nRBC: 0 % (ref 0.0–0.2)

## 2021-11-22 LAB — CMP (CANCER CENTER ONLY)
ALT: 56 U/L — ABNORMAL HIGH (ref 0–44)
AST: 67 U/L — ABNORMAL HIGH (ref 15–41)
Albumin: 3.7 g/dL (ref 3.5–5.0)
Alkaline Phosphatase: 224 U/L — ABNORMAL HIGH (ref 38–126)
Anion gap: 10 (ref 5–15)
BUN: 28 mg/dL — ABNORMAL HIGH (ref 8–23)
CO2: 23 mmol/L (ref 22–32)
Calcium: 9.7 mg/dL (ref 8.9–10.3)
Chloride: 99 mmol/L (ref 98–111)
Creatinine: 0.79 mg/dL (ref 0.44–1.00)
GFR, Estimated: >60 mL/min (ref >60)
Glucose, Bld: 251 mg/dL — ABNORMAL HIGH (ref 70–99)
Potassium: 4.2 mmol/L (ref 3.5–5.1)
Sodium: 132 mmol/L — ABNORMAL LOW (ref 135–145)
Total Bilirubin: 0.8 mg/dL (ref 0.3–1.2)
Total Protein: 7 g/dL (ref 6.5–8.1)

## 2021-11-22 LAB — RAD ONC ARIA SESSION SUMMARY
Course Elapsed Days: 14
Plan Fractions Treated to Date: 9
Plan Fractions Treated to Date: 9
Plan Prescribed Dose Per Fraction: 2 Gy
Plan Prescribed Dose Per Fraction: 2.3 Gy
Plan Total Fractions Prescribed: 20
Plan Total Fractions Prescribed: 20
Plan Total Prescribed Dose: 40 Gy
Plan Total Prescribed Dose: 46 Gy
Reference Point Dosage Given to Date: 18 Gy
Reference Point Dosage Given to Date: 20.7 Gy
Reference Point Session Dosage Given: 2 Gy
Reference Point Session Dosage Given: 2.3 Gy
Session Number: 9

## 2021-11-22 LAB — PREPARE RBC (CROSSMATCH)

## 2021-11-22 LAB — SAMPLE TO BLOOD BANK

## 2021-11-22 NOTE — Progress Notes (Signed)
[facility] [place], [place] [address] [phone]   New Patient Evaluation  Date of Service: 11/22/21 Patient Name: Tanya Mitchell Patient MRN: [id number] Patient DOB: 06-Sep-1945 Provider: Ventura Sellers, MD  Identifying Statement:  [name] is a 76 y.o. female with Diplopia  Abnormal brain MRI who presents for initial consultation and evaluation regarding cancer associated neurologic deficits.    Referring Provider: Benay Pike, MD [place],  [place] [id number]  Primary Cancer:  Oncologic History: Oncology History  Merkel cell carcinoma of lip ([facility])  10/21/2021 Initial Diagnosis   Merkel cell carcinoma of lip ([facility] [facility])   10/21/2021 Cancer Staging   Staging form: Merkel Cell Carcinoma, AJCC 8th Edition - Pathologic stage from 10/21/2021: Stage IIIB (pT2, pN1b, cM0) - Signed by Eppie Gibson, MD on 10/21/2021 Stage prefix: Initial diagnosis Primary tumor: Present     History of Present Illness: The patient's records from the referring physician were obtained and reviewed and the patient interviewed to confirm this HPI.  Keith Rake Pettibone describes episode of sudden visual disturbance while riding in car last week.  Instantly, she went from normal state of vision to "seeing double, objects next to each other".  This was worse when looking to the left or looking far distances.  Over the past few days, symptoms have persisted, not improved at all.  Double vision resolves with covering either eye.  No pain or blurriness associated. Continues on radiation treatments for merckel cell carcinoma with Dr. Isidore Moos.    Medications: Current Outpatient Medications on File Prior to Visit  Medication Sig Dispense Refill   allopurinol (ZYLOPRIM) 300 MG tablet Take 1 tablet (300 mg total) by mouth daily. 60 tablet 6   aspirin EC 81 MG tablet Take 1 tablet (81 mg total) by mouth daily. Swallow whole. 90 tablet 3    atorvastatin (LIPITOR) 80 MG tablet Take 1 tablet (80 mg total) by mouth daily. 90 tablet 3   cholecalciferol (VITAMIN D) 1000 UNITS tablet Take 2,000 Units by mouth in the morning.     dapagliflozin propanediol (FARXIGA) 10 MG TABS tablet Take 1 tablet (10 mg total) by mouth daily before breakfast. 90 tablet 3   furosemide (LASIX) 40 MG tablet Take 1 tablet (40 mg total) by mouth daily as needed. 90 tablet 1   hydroxyurea (HYDREA) 500 MG capsule Take 500 mg by mouth daily. In the evening. May take with food to minimize GI side effects.     Magnesium 400 MG TABS Take 1 tablet by mouth daily as needed. Only take on days taking furosemide 90 tablet 1   metFORMIN (GLUCOPHAGE) 500 MG tablet Take 0.5 tablets (250 mg total) by mouth in the morning and at bedtime.     metoprolol succinate (TOPROL XL) 25 MG 24 hr tablet Take 0.5 tablets (12.5 mg total) by mouth at bedtime. Please hold this med if systolic BP is 90 or less. 45 tablet 2   potassium chloride SA (KLOR-CON M) 20 MEQ tablet Take 1 tablet (20 mEq total) by mouth daily as needed. Only on days taking furosemide 90 tablet 1   sacubitril-valsartan (ENTRESTO) 24-26 MG Take 1 tablet by mouth 2 (two) times daily. 180 tablet 3   sodium fluoride (SODIUM FLUORIDE 5000 PLUS) 1.1 % CREA dental cream Place pea-size amount onto toothbrush every evening before bedtime to brush teeth. Do NOT rinse with water, eat or drink for at least 30 minutes  after use. 51 g 4   triamcinolone (KENALOG) 0.025 % ointment Apply topically as needed.     No current facility-administered medications on file prior to visit.    Allergies: No Known Allergies Past Medical History:  Past Medical History:  Diagnosis Date   Breast cancer ([place]) 1995 and 2007   Cardiomyopathy secondary to chemotherapy [facility])    has improved    Hyperlipidemia    Hypertension    under control, has been on med. x 3 yrs.   Melanoma in situ of back ([facility]) 09/2011   left   Personal history of chemotherapy     Personal history of radiation therapy    Skin cancer 2013   Melanoma   Past Surgical History:  Past Surgical History:  Procedure Laterality Date   BREAST LUMPECTOMY Right 12/21/2005   right   MASTECTOMY Left 1995   with breast reconstruction - left   RIGHT/LEFT HEART CATH AND CORONARY ANGIOGRAPHY N/A 04/20/2021   Procedure: RIGHT/LEFT HEART CATH AND CORONARY ANGIOGRAPHY;  Surgeon: Jettie Booze, MD;  Location: [facility];  Service: Cardiovascular;  Laterality: N/A;   Social History:  Social History   Socioeconomic History   Marital status: Married    Spouse name: Not on file   Number of children: Not on file   Years of education: Not on file   Highest education level: Not on file  Occupational History   Not on file  Tobacco Use   Smoking status: Former    Types: Cigarettes    Quit date: 02/14/1964    Years since quitting: 57.8   Smokeless tobacco: Never  Vaping Use   Vaping Use: Never used  Substance and Sexual Activity   Alcohol use: Yes    Comment: daily glass of wine   Drug use: No   Sexual activity: Yes    Birth control/protection: Post-menopausal  Other Topics Concern   Not on file  Social History Narrative   Not on file   Social Determinants of Health   Financial Resource Strain: Low Risk  (11/03/2021)   Overall Financial Resource Strain (CARDIA)    Difficulty of Paying Living Expenses: Not hard at all  Food Insecurity: No Food Insecurity (11/03/2021)   Hunger Vital Sign    Worried About Running Out of Food in the Last Year: Never true    [place] in the Last Year: Never true  Transportation Needs: No Transportation Needs (11/03/2021)   PRAPARE - Hydrologist (Medical): No    Lack of Transportation (Non-Medical): No  Physical Activity: Not on file  Stress: Not on file  Social Connections: Not on file  Intimate Partner Violence: Not on file   Family History:  Family History  Problem Relation Age of  Onset   Lung cancer Father    Cancer Father        lung   Autism Son    Breast cancer Daughter     Review of Systems: Constitutional: Doesn't report fevers, chills or abnormal weight loss Eyes: Doesn't report blurriness of vision Ears, nose, mouth, throat, and face: Doesn't report sore throat Respiratory: Doesn't report cough, dyspnea or wheezes Cardiovascular: Doesn't report palpitation, chest discomfort  Gastrointestinal:  Doesn't report nausea, constipation, diarrhea GU: Doesn't report incontinence Skin: Doesn't report skin rashes Neurological: Per HPI Musculoskeletal: Doesn't report joint pain Behavioral/Psych: Doesn't report anxiety  Physical Exam: Vitals:   11/22/21 1128  BP: (!) 100/55  Pulse: (!) 115  Resp: 16  Temp: 97.7 F (36.5 C)  SpO2: 100%   KPS: 80. General: Alert, cooperative, pleasant, in no acute distress Head: Normal EENT: No conjunctival injection or scleral icterus.  Lungs: Resp effort normal Cardiac: Regular rate Abdomen: Non-distended abdomen Skin: No rashes cyanosis or petechiae. Extremities: No clubbing or edema  Neurologic Exam: Mental Status: Awake, alert, attentive to examiner. Oriented to self and environment. Language is fluent with intact comprehension.  Cranial Nerves: Visual acuity is grossly normal. Visual fields are full. Left CNVI paresis, dense. No ptosis. Face is symmetric Motor: Tone and bulk are normal. Power is full in both arms and legs. Reflexes are symmetric, no pathologic reflexes present.  Sensory: Intact to light touch Gait: Normal.   Labs: I have reviewed the data as listed    Component Value Date/Time   NA 135 11/14/2021 1126   NA 138 04/25/2021 1018   NA 139 11/30/2015 1225   K 4.4 11/14/2021 1126   K 4.6 11/30/2015 1225   CL 100 11/14/2021 1126   CL 104 01/18/2012 0948   CO2 26 11/14/2021 1126   CO2 27 11/30/2015 1225   GLUCOSE 237 (H) 11/14/2021 1126   GLUCOSE 124 11/30/2015 1225   GLUCOSE 124 (H)  01/18/2012 0948   BUN 32 (H) 11/14/2021 1126   BUN 22 04/25/2021 1018   BUN 13.1 11/30/2015 1225   CREATININE 0.57 11/14/2021 1126   CREATININE 0.7 11/30/2015 1225   CALCIUM 9.7 11/14/2021 1126   CALCIUM 10.0 11/30/2015 1225   PROT 6.8 11/14/2021 1126   PROT 6.5 01/28/2020 1315   PROT 7.4 11/30/2015 1225   ALBUMIN 4.0 11/14/2021 1126   ALBUMIN 4.2 01/28/2020 1315   ALBUMIN 3.8 11/30/2015 1225   AST 39 11/14/2021 1126   AST 25 11/30/2015 1225   ALT 36 11/14/2021 1126   ALT 27 11/30/2015 1225   ALKPHOS 161 (H) 11/14/2021 1126   ALKPHOS 83 11/30/2015 1225   BILITOT 0.5 11/14/2021 1126   BILITOT 0.60 11/30/2015 1225   GFRNONAA >60 11/14/2021 1126   GFRAA 104 01/28/2020 1315   Lab Results  Component Value Date   WBC 33.9 (H) 11/14/2021   NEUTROABS 30.8 (H) 11/14/2021   HGB 6.9 (LL) 11/14/2021   HCT 20.8 (L) 11/14/2021   MCV 97.2 11/14/2021   PLT 20 (L) 11/14/2021    Imaging:  MR Brain W Wo Contrast  Result Date: 11/15/2021 CLINICAL DATA:  Marked cell carcinoma with new symptoms of double vision. Concern for metastatic disease. EXAM: MRI HEAD WITHOUT AND WITH CONTRAST TECHNIQUE: Multiplanar, multiecho pulse sequences of the brain and surrounding structures were obtained without and with intravenous contrast. CONTRAST:  5 cc Vueway COMPARISON:  None Available. FINDINGS: Brain: There is a small focus of DWI signal abnormality with associated T2/FLAIR hyperintensity and heterogeneous SWI signal dropout in the right postcentral gyrus. There is intrinsic T1 hyperintensity with no definite superimposed surrounding enhancement. There is no surrounding edema or mass effect. This finding may reflect a cavernoma; however, in the absence of prior imaging, a hemorrhagic metastatic lesion can not be entirely excluded. There is no other diffusion signal abnormality. There is no other evidence of acute intracranial hemorrhage or extra-axial fluid collection. Background parenchymal volume is normal.  The ventricles are normal in size. Gray-white differentiation is preserved. Patchy small foci of FLAIR signal abnormality in the supratentorial white matter are nonspecific but likely reflects sequela of chronic small vessel ischemic change. The pituitary and suprasellar region are normal. There is no abnormality  of the optic chiasm. There is no abnormal enhancement. There is no other evidence of intracranial metastatic disease. There is no mass effect or midline shift. Vascular: Normal flow voids. Skull and upper cervical spine: There is diffuse T1 hypointensity throughout the imaged bone marrow. Sinuses/Orbits: The paranasal sinuses are clear. Bilateral lens implants are in place. The globes and orbits are otherwise unremarkable. The optic nerves are unremarkable on these nondedicated sequences. Other: None. IMPRESSION: 1. Small focus of T2/FLAIR signal abnormality with T1 hyperintensity and SWI signal dropout in the right postcentral gyrus without mass effect or surrounding parenchymal edema may reflect a small cavernoma with possible small volume acute blood; however, in the absence of prior studies for comparison, a small hemorrhagic metastatic lesion can not be entirely excluded. Recommend follow-up in 2-3 months to assess for stability. 2. No other suspicious parenchymal signal abnormality to suggest intracranial metastatic disease. 3. Diffusely abnormal T1 marrow signal is nonspecific but is likely related to the patient's history of hematologic abnormality. Diffuse osseous metastatic disease could have a similar appearance. Correlate with prior bone marrow biopsy results. Electronically Signed   By: Valetta Mole M.D.   On: 11/15/2021 13:37   ECHOCARDIOGRAM COMPLETE  Result Date: 10/25/2021    ECHOCARDIOGRAM REPORT   Patient Name:   Cheryel Kyte Gebhart Date of Exam: 10/25/2021 Medical Rec #:  [id number]            Height:       62.0 in Accession #:    [id number]           Weight:       96.0 lb Date of  Birth:  [birth year]            BSA:          1.399 m Patient Age:    76 years             BP:           78/43 mmHg Patient Gender: F                    HR:           107 bpm. Exam Location:  [facility] Procedure: 2D Echo, Color Doppler and Cardiac Doppler Indications:    Heart Failure I50.22  History:        Patient has prior history of Echocardiogram examinations, most                 recent 03/29/2021. Cardiomyopathy secondary to chemotherapy; Risk                 Factors:Hypertension and Dyslipidemia.  Sonographer:    Mikki Santee RDCS Referring Phys: Greer Ee [place] IMPRESSIONS  1. Left ventricular ejection fraction, by estimation, is 40 to 45%. The left ventricle has mildly decreased function. The left ventricle has no regional wall motion abnormalities. Left ventricular diastolic parameters are consistent with Grade I diastolic dysfunction (impaired relaxation).  2. Right ventricular systolic function is normal. The right ventricular size is normal. There is normal pulmonary artery systolic pressure. The estimated right ventricular systolic pressure is 30.0 mmHg.  3. The mitral valve is normal in structure. No evidence of mitral valve regurgitation. No evidence of mitral stenosis.  4. The aortic valve is normal in structure. Aortic valve regurgitation is not visualized. No aortic stenosis is present.  5. The inferior vena cava is normal in size with greater than 50% respiratory variability, suggesting right atrial pressure of 3  mmHg. Comparison(s): Prior EF 25-30%. FINDINGS  Left Ventricle: Left ventricular ejection fraction, by estimation, is 40 to 45%. The left ventricle has mildly decreased function. The left ventricle has no regional wall motion abnormalities. The left ventricular internal cavity size was normal in size. There is no left ventricular hypertrophy. Left ventricular diastolic parameters are consistent with Grade I diastolic dysfunction (impaired relaxation).  LV Wall Scoring: The  inferior wall is akinetic. Right Ventricle: The right ventricular size is normal. No increase in right ventricular wall thickness. Right ventricular systolic function is normal. There is normal pulmonary artery systolic pressure. The tricuspid regurgitant velocity is 2.39 m/s, and  with an assumed right atrial pressure of 3 mmHg, the estimated right ventricular systolic pressure is 26.3 mmHg. Left Atrium: Left atrial size was normal in size. Right Atrium: Right atrial size was normal in size. Pericardium: There is no evidence of pericardial effusion. Mitral Valve: The mitral valve is normal in structure. No evidence of mitral valve regurgitation. No evidence of mitral valve stenosis. Tricuspid Valve: The tricuspid valve is normal in structure. Tricuspid valve regurgitation is trivial. No evidence of tricuspid stenosis. Aortic Valve: The aortic valve is normal in structure. Aortic valve regurgitation is not visualized. No aortic stenosis is present. Pulmonic Valve: The pulmonic valve was normal in structure. Pulmonic valve regurgitation is trivial. No evidence of pulmonic stenosis. Aorta: The aortic root is normal in size and structure. Venous: The inferior vena cava is normal in size with greater than 50% respiratory variability, suggesting right atrial pressure of 3 mmHg. IAS/Shunts: No atrial level shunt detected by color flow Doppler.  LEFT VENTRICLE PLAX 2D LVIDd:         4.60 cm     Diastology LVIDs:         3.70 cm     LV e' lateral:   6.53 cm/s LV PW:         1.00 cm     LV E/e' lateral: 10.8 LV IVS:        1.00 cm LVOT diam:     2.10 cm LV SV:         42 LV SV Index:   30 LVOT Area:     3.46 cm  LV Volumes (MOD) LV vol d, MOD A2C: 75.6 ml LV vol d, MOD A4C: 92.2 ml LV vol s, MOD A2C: 41.9 ml LV vol s, MOD A4C: 55.4 ml LV SV MOD A2C:     33.7 ml LV SV MOD A4C:     92.2 ml LV SV MOD BP:      37.8 ml RIGHT VENTRICLE RV Basal diam:  2.40 cm RV Mid diam:    3.00 cm RV S prime:     13.80 cm/s TAPSE (M-mode): 1.8  cm LEFT ATRIUM             Index        RIGHT ATRIUM          Index LA diam:        3.50 cm 2.50 cm/m   RA Area:     8.16 cm LA Vol (A2C):   62.3 ml 44.52 ml/m  RA Volume:   13.80 ml 9.86 ml/m LA Vol (A4C):   33.7 ml 24.08 ml/m LA Biplane Vol: 48.4 ml 34.59 ml/m  AORTIC VALVE LVOT Vmax:   79.70 cm/s LVOT Vmean:  52.400 cm/s LVOT VTI:    0.120 m  AORTA Ao Root diam: 3.00 cm Ao Asc diam:  3.40  cm MITRAL VALVE               TRICUSPID VALVE MV Area (PHT): 3.34 cm    TR Peak grad:   22.8 mmHg MV Decel Time: 227 msec    TR Vmax:        239.00 cm/s MV E velocity: 70.70 cm/s MV A velocity: 99.00 cm/s  SHUNTS MV E/A ratio:  0.71        Systemic VTI:  0.12 m                            Systemic Diam: 2.10 cm Candee Furbish MD Electronically signed by Candee Furbish MD Signature Date/Time: 10/25/2021/1:57:48 PM    Final      Assessment/Plan Diplopia  Abnormal brain MRI  Keith Rake Machamer presents with clinical syndrome localizing to the left 6th cranial nerve.  Etiology is likely vascular, given apoplectic onset, concurrent diabetes.    We reviewed her brain MRI; findings suggestive of incidental cavernoma.  We will repeat scan in 3 months to rule out metastatic or other neoplastic process.  For diplopia, we counseled her regarding control of blood sugar, lifestyle interventions for secondary stroke prevention.  She may use an eye-patch or cover as needed for symptomatic relief.   If symptoms don't improve over weeks/month we are happy to re-evaluate and would likely suggest referral to neuro-ophthalmology.   We spent twenty additional minutes teaching regarding the natural history, biology, and historical experience in the treatment of neurologic complications of cancer.   We appreciate the opportunity to participate in the care of [facility].  We will giver her a call in 3 months to review results of follow up MRI study.  All questions were answered. The patient knows to call the clinic with  any problems, questions or concerns. No barriers to learning were detected.  The total time spent in the encounter was 45 minutes and more than 50% was on counseling and review of test results   Ventura Sellers, MD Medical Director of Neuro-Oncology [facility] [place] 11/22/21 11:16 AM

## 2021-11-22 NOTE — Progress Notes (Signed)
Orders for PRBC and platelets placed. Confirmed with Beth in BB. Pt's expected transfusion 10/13.

## 2021-11-22 NOTE — Telephone Encounter (Signed)
Attempted to call pt to advise we would need to transfuse PRBC and platelets as a result of today's lab work, per MD. LVM for pt to call back for appt 10/13 at 0800.

## 2021-11-23 ENCOUNTER — Other Ambulatory Visit: Payer: Self-pay

## 2021-11-23 ENCOUNTER — Ambulatory Visit
Admission: RE | Admit: 2021-11-23 | Discharge: 2021-11-23 | Disposition: A | Discharge status: Home / Self Care | Payer: Medicare Other | Source: Ambulatory Visit | Attending: Radiation Oncology | Admitting: Radiation Oncology

## 2021-11-23 ENCOUNTER — Inpatient Hospital Stay: Payer: Medicare Other | Admitting: Dietician

## 2021-11-23 DIAGNOSIS — C4A0 Merkel cell carcinoma of lip: Secondary | ICD-10-CM | POA: Diagnosis not present

## 2021-11-23 LAB — RAD ONC ARIA SESSION SUMMARY
Course Elapsed Days: 15
Plan Fractions Treated to Date: 10
Plan Fractions Treated to Date: 10
Plan Prescribed Dose Per Fraction: 2 Gy
Plan Prescribed Dose Per Fraction: 2.3 Gy
Plan Total Fractions Prescribed: 20
Plan Total Fractions Prescribed: 20
Plan Total Prescribed Dose: 40 Gy
Plan Total Prescribed Dose: 46 Gy
Reference Point Dosage Given to Date: 20 Gy
Reference Point Dosage Given to Date: 23 Gy
Reference Point Session Dosage Given: 2 Gy
Reference Point Session Dosage Given: 2.3 Gy
Session Number: 10

## 2021-11-24 ENCOUNTER — Other Ambulatory Visit: Payer: Self-pay

## 2021-11-24 ENCOUNTER — Ambulatory Visit
Admission: RE | Admit: 2021-11-24 | Discharge: 2021-11-24 | Disposition: A | Discharge status: Home / Self Care | Payer: Medicare Other | Source: Ambulatory Visit | Attending: Radiation Oncology | Admitting: Radiation Oncology

## 2021-11-24 DIAGNOSIS — C4A0 Merkel cell carcinoma of lip: Secondary | ICD-10-CM | POA: Diagnosis not present

## 2021-11-24 LAB — RAD ONC ARIA SESSION SUMMARY
Course Elapsed Days: 16
Plan Fractions Treated to Date: 11
Plan Fractions Treated to Date: 11
Plan Prescribed Dose Per Fraction: 2 Gy
Plan Prescribed Dose Per Fraction: 2.3 Gy
Plan Total Fractions Prescribed: 20
Plan Total Fractions Prescribed: 20
Plan Total Prescribed Dose: 40 Gy
Plan Total Prescribed Dose: 46 Gy
Reference Point Dosage Given to Date: 22 Gy
Reference Point Dosage Given to Date: 25.3 Gy
Reference Point Session Dosage Given: 2 Gy
Reference Point Session Dosage Given: 2.3 Gy
Session Number: 11

## 2021-11-25 ENCOUNTER — Inpatient Hospital Stay: Payer: Medicare Other

## 2021-11-25 ENCOUNTER — Ambulatory Visit
Admission: RE | Admit: 2021-11-25 | Discharge: 2021-11-25 | Disposition: A | Discharge status: Home / Self Care | Payer: Medicare Other | Source: Ambulatory Visit | Attending: Radiation Oncology | Admitting: Radiation Oncology

## 2021-11-25 ENCOUNTER — Telehealth: Payer: Self-pay | Admitting: Cardiology

## 2021-11-25 ENCOUNTER — Other Ambulatory Visit: Payer: Self-pay

## 2021-11-25 ENCOUNTER — Other Ambulatory Visit: Payer: Self-pay | Admitting: *Deleted

## 2021-11-25 DIAGNOSIS — Z79899 Other long term (current) drug therapy: Secondary | ICD-10-CM

## 2021-11-25 DIAGNOSIS — I5022 Chronic systolic (congestive) heart failure: Secondary | ICD-10-CM

## 2021-11-25 DIAGNOSIS — D75839 Thrombocytosis, unspecified: Secondary | ICD-10-CM

## 2021-11-25 DIAGNOSIS — D649 Anemia, unspecified: Secondary | ICD-10-CM

## 2021-11-25 DIAGNOSIS — C4A0 Merkel cell carcinoma of lip: Secondary | ICD-10-CM | POA: Diagnosis not present

## 2021-11-25 DIAGNOSIS — D46Z Other myelodysplastic syndromes: Secondary | ICD-10-CM | POA: Diagnosis not present

## 2021-11-25 DIAGNOSIS — D471 Chronic myeloproliferative disease: Secondary | ICD-10-CM

## 2021-11-25 DIAGNOSIS — D6481 Anemia due to antineoplastic chemotherapy: Secondary | ICD-10-CM

## 2021-11-25 LAB — RAD ONC ARIA SESSION SUMMARY
Course Elapsed Days: 17
Plan Fractions Treated to Date: 12
Plan Fractions Treated to Date: 12
Plan Prescribed Dose Per Fraction: 2 Gy
Plan Prescribed Dose Per Fraction: 2.3 Gy
Plan Total Fractions Prescribed: 20
Plan Total Fractions Prescribed: 20
Plan Total Prescribed Dose: 40 Gy
Plan Total Prescribed Dose: 46 Gy
Reference Point Dosage Given to Date: 24 Gy
Reference Point Dosage Given to Date: 27.6 Gy
Reference Point Session Dosage Given: 2 Gy
Reference Point Session Dosage Given: 2.3 Gy
Session Number: 12

## 2021-11-25 LAB — TYPE AND SCREEN
ABO/RH(D): AB NEG
Antibody Screen: NEGATIVE
Unit division: 0

## 2021-11-25 LAB — PREPARE RBC (CROSSMATCH)

## 2021-11-25 LAB — BPAM RBC
Blood Product Expiration Date: 202311032359
Unit Type and Rh: 600

## 2021-11-25 MED ORDER — ACETAMINOPHEN 325 MG PO TABS
650.0000 mg | ORAL_TABLET | Freq: Once | ORAL | Status: AC
  Administered 2021-11-25: 650 mg via ORAL
  Filled 2021-11-25: qty 2

## 2021-11-25 MED ORDER — SODIUM CHLORIDE 0.9% FLUSH: 10.0000 mL | INTRAVENOUS | Status: DC | PRN

## 2021-11-25 MED ORDER — DIPHENHYDRAMINE HCL 25 MG PO CAPS
25.0000 mg | ORAL_CAPSULE | Freq: Once | ORAL | Status: AC
  Administered 2021-11-25: 25 mg via ORAL
  Filled 2021-11-25: qty 1

## 2021-11-25 MED ORDER — ENTRESTO 24-26 MG PO TABS: 1.0000 | ORAL_TABLET | Freq: Two times a day (BID) | ORAL | 3 refills | Status: DC

## 2021-11-25 MED ORDER — SODIUM CHLORIDE 0.9% IV SOLUTION
250.0000 mL | Freq: Once | INTRAVENOUS | Status: AC
  Administered 2021-11-25: 250 mL via INTRAVENOUS

## 2021-11-25 MED ORDER — ENTRESTO 24-26 MG PO TABS: 1.0000 | ORAL_TABLET | Freq: Two times a day (BID) | ORAL | 3 refills | Status: AC

## 2021-11-25 NOTE — Telephone Encounter (Signed)
*  STAT* If patient is at the pharmacy, call can be transferred to refill team.   1. Which medications need to be refilled? (please list name of each medication and dose if known) sacubitril-valsartan (ENTRESTO) 24-26 MG  2. Which pharmacy/location (including street and city if local pharmacy) is medication to be sent to? [place], [place] - [address] AT [place]  3. Do they need a 30 day or 90 day supply? 90   Patient will be out of medication on tomorrow

## 2021-11-25 NOTE — Addendum Note (Signed)
Addended by: Carter Kitten D on: 11/25/2021 03:39 PM   Modules accepted: Orders

## 2021-11-25 NOTE — Telephone Encounter (Signed)
Pt's medication was sent to pt's pharmacy as requested. Confirmation received.  °

## 2021-11-25 NOTE — Patient Instructions (Signed)
Blood Transfusion, Adult A blood transfusion is a procedure in which you receive blood through an IV tube. You may need this procedure because of: A bleeding disorder. An illness. An injury. A surgery. The blood may come from someone else (a donor). You may also be able to donate blood for yourself before a surgery. The blood given in a transfusion may be made up of different types of cells. You may get: Red blood cells. These carry oxygen to the cells in the body. Platelets. These help your blood to clot. Plasma. This is the liquid part of your blood. It carries proteins and other substances through the body. White blood cells. These help you fight infections. If you have a clotting disorder, you may also get other types of blood products. Depending on the type of blood product, this procedure may take 1-4 hours to complete. Tell your doctor about: Any bleeding problems you have. Any reactions you have had during a blood transfusion in the past. Any allergies you have. All medicines you are taking, including vitamins, herbs, eye drops, creams, and over-the-counter medicines. Any surgeries you have had. Any medical conditions you have. Whether you are pregnant or may be pregnant. What are the risks? Talk with your health care provider about risks. The most common problems include: A mild allergic reaction. This includes red, swollen areas of skin (hives) and itching. Fever or chills. This may be the body's response to new blood cells received. This may happen during or up to 4 hours after the transfusion. More serious problems may include: A serious allergic reaction. This includes breathing trouble or swelling around the face and lips. Too much fluid in the lungs. This may cause breathing problems. Lung injury. This causes breathing trouble and low oxygen in the blood. This can happen within hours of the transfusion or days later. Too much iron. This can happen after getting many blood  transfusions over a period of time. An infection or virus passed through the blood. This is rare. Donated blood is carefully tested before it is given. Your body's defense system (immune system) trying to attack the new blood cells. This is rare. Symptoms may include fever, chills, nausea, low blood pressure, and low back or chest pain. Donated cells attacking healthy tissues. This is rare. What happens before the procedure? You will have a blood test to find out your blood type. The test also finds out what type of blood your body will accept and matches it to the donor type. If you are going to have a planned surgery, you may be able to donate your own blood. This may be done in case you need a transfusion. You will have your temperature, blood pressure, and pulse checked. You may receive medicine to help prevent an allergic reaction. This may be done if you have had a reaction to a transfusion before. This medicine may be given to you by mouth or through an IV tube. What happens during the procedure?  An IV tube will be put into one of your veins. The bag of blood will be attached to your IV tube. Then, the blood will enter through your vein. Your temperature, blood pressure, and pulse will be checked often. This is done to find early signs of a transfusion reaction. Tell your nurse right away if you have any of these symptoms: Shortness of breath or trouble breathing. Chest or back pain. Fever or chills. Red, swollen areas of skin or itching. If you have any signs   or symptoms of a reaction, your transfusion will be stopped. You may also be given medicine. When the transfusion is finished, your IV tube will be taken out. Pressure may be put on the IV site for a few minutes. A bandage (dressing) will be put on the IV site. The procedure may vary among doctors and hospitals. What happens after the procedure? You will be monitored until you leave the hospital or clinic. This includes  checking your temperature, blood pressure, pulse, breathing rate, and blood oxygen level. Your blood may be tested to see how you have responded to the transfusion. You may be warmed with fluids or blankets. This is done to keep the temperature of your body normal. If you have your procedure in an outpatient setting, you will be told whom to contact to report any reactions. Where to find more information Visit the [employer]: redcross.org Summary A blood transfusion is a procedure in which you receive blood through an IV tube. The blood you are given may be made up of different blood cells. You may receive red blood cells, platelets, plasma, or white blood cells. Your temperature, blood pressure, and pulse will be checked often. After the procedure, your blood may be tested to see how you have responded. This information is not intended to replace advice given to you by your health care provider. Make sure you discuss any questions you have with your health care provider. Document Revised: 04/29/2021 Document Reviewed: 04/29/2021 Elsevier Patient Education  2023 [employer] Inc.  

## 2021-11-26 LAB — TYPE AND SCREEN
ABO/RH(D): AB NEG
Antibody Screen: NEGATIVE
Unit division: 0

## 2021-11-26 LAB — BPAM RBC
Blood Product Expiration Date: 202311032359
ISSUE DATE / TIME: 202310131101
Unit Type and Rh: 600

## 2021-11-26 LAB — PREPARE PLATELET PHERESIS: Unit division: 0

## 2021-11-26 LAB — BPAM PLATELET PHERESIS
Blood Product Expiration Date: 202310132359
ISSUE DATE / TIME: 202310130858
Unit Type and Rh: 600

## 2021-11-28 ENCOUNTER — Inpatient Hospital Stay: Payer: Medicare Other

## 2021-11-28 ENCOUNTER — Ambulatory Visit: Payer: Medicare Other

## 2021-11-28 ENCOUNTER — Other Ambulatory Visit: Payer: Self-pay | Admitting: *Deleted

## 2021-11-28 ENCOUNTER — Other Ambulatory Visit: Payer: Self-pay | Admitting: Radiation Oncology

## 2021-11-28 ENCOUNTER — Other Ambulatory Visit: Payer: Self-pay

## 2021-11-28 ENCOUNTER — Ambulatory Visit
Admission: RE | Admit: 2021-11-28 | Discharge: 2021-11-28 | Disposition: A | Discharge status: Home / Self Care | Payer: Medicare Other | Source: Ambulatory Visit | Attending: Radiation Oncology | Admitting: Radiation Oncology

## 2021-11-28 VITALS — BP 87/52 | HR 88 | Temp 97.8°F | Resp 18

## 2021-11-28 DIAGNOSIS — D469 Myelodysplastic syndrome, unspecified: Secondary | ICD-10-CM

## 2021-11-28 DIAGNOSIS — D75839 Thrombocytosis, unspecified: Secondary | ICD-10-CM

## 2021-11-28 DIAGNOSIS — C4A0 Merkel cell carcinoma of lip: Secondary | ICD-10-CM

## 2021-11-28 DIAGNOSIS — D46Z Other myelodysplastic syndromes: Secondary | ICD-10-CM | POA: Diagnosis not present

## 2021-11-28 DIAGNOSIS — D471 Chronic myeloproliferative disease: Secondary | ICD-10-CM

## 2021-11-28 LAB — CBC WITH DIFFERENTIAL (CANCER CENTER ONLY)
Abs Immature Granulocytes: 21.89 10*3/uL — ABNORMAL HIGH (ref 0.00–0.07)
Basophils Absolute: 0.1 10*3/uL (ref 0.0–0.1)
Basophils Relative: 0 %
Eosinophils Absolute: 0.1 10*3/uL (ref 0.0–0.5)
Eosinophils Relative: 0 %
HCT: 25 % — ABNORMAL LOW (ref 36.0–46.0)
Hemoglobin: 8.7 g/dL — ABNORMAL LOW (ref 12.0–15.0)
Immature Granulocytes: 20 %
Lymphocytes Relative: 2 %
Lymphs Abs: 2.5 10*3/uL (ref 0.7–4.0)
MCH: 34.3 pg — ABNORMAL HIGH (ref 26.0–34.0)
MCHC: 34.8 g/dL (ref 30.0–36.0)
MCV: 98.4 fL (ref 80.0–100.0)
Monocytes Absolute: 0.9 10*3/uL (ref 0.1–1.0)
Monocytes Relative: 1 %
Neutro Abs: 82.7 10*3/uL — ABNORMAL HIGH (ref 1.7–7.7)
Neutrophils Relative %: 77 %
Platelet Count: 14 10*3/uL — ABNORMAL LOW (ref 150–400)
RBC: 2.54 MIL/uL — ABNORMAL LOW (ref 3.87–5.11)
RDW: 18.7 % — ABNORMAL HIGH (ref 11.5–15.5)
Smear Review: NORMAL
WBC Count: 108.1 10*3/uL (ref 4.0–10.5)
nRBC: 0 % (ref 0.0–0.2)

## 2021-11-28 LAB — CMP (CANCER CENTER ONLY)
ALT: 58 U/L — ABNORMAL HIGH (ref 0–44)
AST: 88 U/L — ABNORMAL HIGH (ref 15–41)
Albumin: 3.4 g/dL — ABNORMAL LOW (ref 3.5–5.0)
Alkaline Phosphatase: 308 U/L — ABNORMAL HIGH (ref 38–126)
Anion gap: 9 (ref 5–15)
BUN: 30 mg/dL — ABNORMAL HIGH (ref 8–23)
CO2: 25 mmol/L (ref 22–32)
Calcium: 9.8 mg/dL (ref 8.9–10.3)
Chloride: 100 mmol/L (ref 98–111)
Creatinine: 0.68 mg/dL (ref 0.44–1.00)
GFR, Estimated: >60 mL/min (ref >60)
Glucose, Bld: 203 mg/dL — ABNORMAL HIGH (ref 70–99)
Potassium: 3.6 mmol/L (ref 3.5–5.1)
Sodium: 134 mmol/L — ABNORMAL LOW (ref 135–145)
Total Bilirubin: 0.8 mg/dL (ref 0.3–1.2)
Total Protein: 6.6 g/dL (ref 6.5–8.1)

## 2021-11-28 LAB — RAD ONC ARIA SESSION SUMMARY
Course Elapsed Days: 20
Plan Fractions Treated to Date: 13
Plan Fractions Treated to Date: 13
Plan Prescribed Dose Per Fraction: 2 Gy
Plan Prescribed Dose Per Fraction: 2.3 Gy
Plan Total Fractions Prescribed: 20
Plan Total Fractions Prescribed: 20
Plan Total Prescribed Dose: 40 Gy
Plan Total Prescribed Dose: 46 Gy
Reference Point Dosage Given to Date: 26 Gy
Reference Point Dosage Given to Date: 29.9 Gy
Reference Point Session Dosage Given: 2 Gy
Reference Point Session Dosage Given: 2.3 Gy
Session Number: 13

## 2021-11-28 LAB — SAMPLE TO BLOOD BANK

## 2021-11-28 MED ORDER — SODIUM CHLORIDE 0.9% IV SOLUTION
250.0000 mL | Freq: Once | INTRAVENOUS | Status: AC
  Administered 2021-11-28: 250 mL via INTRAVENOUS

## 2021-11-28 MED ORDER — ACETAMINOPHEN 325 MG PO TABS
650.0000 mg | ORAL_TABLET | Freq: Once | ORAL | Status: AC
  Administered 2021-11-28: 650 mg via ORAL
  Filled 2021-11-28: qty 2

## 2021-11-28 MED ORDER — DARBEPOETIN ALFA 300 MCG/0.6ML IJ SOSY
300.0000 ug | PREFILLED_SYRINGE | Freq: Once | INTRAMUSCULAR | Status: AC
  Administered 2021-11-28: 300 ug via SUBCUTANEOUS
  Filled 2021-11-28: qty 0.6

## 2021-11-28 MED ORDER — DIPHENHYDRAMINE HCL 25 MG PO CAPS
25.0000 mg | ORAL_CAPSULE | Freq: Once | ORAL | Status: AC
  Administered 2021-11-28: 25 mg via ORAL
  Filled 2021-11-28: qty 1

## 2021-11-28 MED ORDER — FLUCONAZOLE 100 MG PO TABS: ORAL_TABLET | ORAL | 0 refills | Status: AC

## 2021-11-28 NOTE — Patient Instructions (Signed)
Platelet Transfusion A platelet transfusion is a procedure in which a person receives donated platelets through an IV. Platelets are parts of blood that stick together and form a clot to help the body stop bleeding after an injury. If you have too few platelets, your blood may have trouble clotting. This may cause you to bleed and bruise very easily. You may need a platelet transfusion if you have a condition that causes a low number of platelets (thrombocytopenia). A platelet transfusion may be used to stop or prevent excessive bleeding. Tell a health care provider about: Any reactions you have had during previous transfusions. Any allergies you have. All medicines you are taking, including vitamins, herbs, eye drops, creams, and over-the-counter medicines. Any bleeding problems you have. Any surgeries you have had. Any medical conditions you have. Whether you are pregnant or may be pregnant. What are the risks? Generally, this is a safe procedure. However, problems may occur, including: Fever. Infection. Allergic reaction to the donated (donor) platelets. Your body's disease-fighting system (immune system) attacking the donor platelets (hemolytic reaction). This is rare. A rare reaction that causes lung damage (transfusion-related acute lung injury). What happens before the procedure? Medicines Ask your health care provider about: Changing or stopping your regular medicines. This is especially important if you are taking diabetes medicines or blood thinners. Taking medicines such as aspirin and ibuprofen. These medicines can thin your blood. Do not take these medicines unless your health care provider tells you to take them. Taking over-the-counter medicines, vitamins, herbs, and supplements. General instructions You will have a blood test to determine your blood type. Your blood type determines what kind of platelets you will be given. Follow instructions from your health care provider  about eating or drinking restrictions. If you have had an allergic reaction to a transfusion in the past, you may be given medicine to help prevent a reaction. Your temperature, blood pressure, pulse, and breathing will be monitored. What happens during the procedure?  An IV will be inserted into one of your veins. For your safety, two health care providers will verify your identity along with the donor platelets about to be infused. A bag of donor platelets will be connected to your IV. The platelets will flow into your bloodstream. This usually takes 30-60 minutes. Your temperature, blood pressure, pulse, and breathing will be monitored during the transfusion. This helps detect early signs of any reaction. You will also be monitored for other symptoms that may indicate a reaction, including chills, hives, or itching. If you have signs of a reaction at any time, your transfusion will be stopped, and you may be given medicine to help manage the reaction. When your transfusion is complete, your IV will be removed. Pressure may be applied to the IV site for a few minutes to stop any bleeding. The IV site will be covered with a bandage (dressing). The procedure may vary among health care providers and hospitals. What can I expect after the procedure? Your blood pressure, temperature, pulse, and breathing will be monitored until you leave the hospital or clinic. You may have some bruising and soreness at your IV site. Follow these instructions at home: Medicines Take over-the-counter and prescription medicines only as told by your health care provider. Talk with your health care provider before you take any medicines that contain aspirin or NSAIDs, such as ibuprofen. These medicines increase your risk for dangerous bleeding. IV site care Check your IV site every day for signs of infection. Check for:   Redness, swelling, or pain. Fluid or blood. If fluid or blood drains from your IV site, use your  hands to press down firmly on a bandage covering the area for a minute or two. Doing this should stop the bleeding. Warmth. Pus or a bad smell. General instructions Change or remove your dressing as told by your health care provider. Return to your normal activities as told by your health care provider. Ask your health care provider what activities are safe for you. Do not take baths, swim, or use a hot tub until your health care provider approves. Ask your health care provider if you may take showers. Keep all follow-up visits. This is important. Contact a health care provider if: You have a headache that does not go away with medicine. You have hives, rash, or itchy skin. You have nausea or vomiting. You feel unusually tired or weak. You have signs of infection at your IV site. Get help right away if: You have a fever or chills. You urinate less often than usual. Your urine is darker colored than normal. You have any of the following: Trouble breathing. Pain in your back, abdomen, or chest. Cool, clammy skin. A fast heartbeat. Summary Platelets are tiny pieces of blood cells that clump together to form a blood clot when you have an injury. If you have too few platelets, your blood may have trouble clotting. A platelet transfusion is a procedure in which you receive donated platelets through an IV. A platelet transfusion may be used to stop or prevent excessive bleeding. After the procedure, check your IV site every day for signs of infection. This information is not intended to replace advice given to you by your health care provider. Make sure you discuss any questions you have with your health care provider. Document Revised: 08/05/2020 Document Reviewed: 08/05/2020 Elsevier Patient Education  2023 [employer]  

## 2021-11-29 ENCOUNTER — Other Ambulatory Visit: Payer: Self-pay

## 2021-11-29 ENCOUNTER — Other Ambulatory Visit: Payer: Self-pay | Admitting: Radiation Therapy

## 2021-11-29 ENCOUNTER — Ambulatory Visit
Admission: RE | Admit: 2021-11-29 | Discharge: 2021-11-29 | Disposition: A | Discharge status: Home / Self Care | Payer: Medicare Other | Source: Ambulatory Visit | Attending: Radiation Oncology | Admitting: Radiation Oncology

## 2021-11-29 DIAGNOSIS — C4A0 Merkel cell carcinoma of lip: Secondary | ICD-10-CM | POA: Diagnosis not present

## 2021-11-29 LAB — RAD ONC ARIA SESSION SUMMARY
Course Elapsed Days: 21
Plan Fractions Treated to Date: 14
Plan Fractions Treated to Date: 14
Plan Prescribed Dose Per Fraction: 2 Gy
Plan Prescribed Dose Per Fraction: 2.3 Gy
Plan Total Fractions Prescribed: 20
Plan Total Fractions Prescribed: 20
Plan Total Prescribed Dose: 40 Gy
Plan Total Prescribed Dose: 46 Gy
Reference Point Dosage Given to Date: 28 Gy
Reference Point Dosage Given to Date: 32.2 Gy
Reference Point Session Dosage Given: 2 Gy
Reference Point Session Dosage Given: 2.3 Gy
Session Number: 14

## 2021-11-29 LAB — BPAM PLATELET PHERESIS
Blood Product Expiration Date: 202310172359
ISSUE DATE / TIME: 202310161522
Unit Type and Rh: 6200

## 2021-11-29 LAB — PREPARE PLATELET PHERESIS: Unit division: 0

## 2021-11-30 ENCOUNTER — Other Ambulatory Visit: Payer: Self-pay

## 2021-11-30 ENCOUNTER — Inpatient Hospital Stay: Payer: Medicare Other | Admitting: Dietician

## 2021-11-30 ENCOUNTER — Ambulatory Visit
Admission: RE | Admit: 2021-11-30 | Discharge: 2021-11-30 | Disposition: A | Discharge status: Home / Self Care | Payer: Medicare Other | Source: Ambulatory Visit | Attending: Radiation Oncology | Admitting: Radiation Oncology

## 2021-11-30 ENCOUNTER — Other Ambulatory Visit: Payer: Self-pay | Admitting: *Deleted

## 2021-11-30 DIAGNOSIS — C4A0 Merkel cell carcinoma of lip: Secondary | ICD-10-CM

## 2021-11-30 DIAGNOSIS — C946 Myelodysplastic disease, not classified: Secondary | ICD-10-CM

## 2021-11-30 DIAGNOSIS — D471 Chronic myeloproliferative disease: Secondary | ICD-10-CM

## 2021-11-30 DIAGNOSIS — D75839 Thrombocytosis, unspecified: Secondary | ICD-10-CM

## 2021-11-30 DIAGNOSIS — C4359 Malignant melanoma of other part of trunk: Secondary | ICD-10-CM

## 2021-11-30 DIAGNOSIS — D469 Myelodysplastic syndrome, unspecified: Secondary | ICD-10-CM

## 2021-11-30 DIAGNOSIS — D72829 Elevated white blood cell count, unspecified: Secondary | ICD-10-CM

## 2021-11-30 LAB — RAD ONC ARIA SESSION SUMMARY
Course Elapsed Days: 22
Plan Fractions Treated to Date: 15
Plan Fractions Treated to Date: 15
Plan Prescribed Dose Per Fraction: 2 Gy
Plan Prescribed Dose Per Fraction: 2.3 Gy
Plan Total Fractions Prescribed: 20
Plan Total Fractions Prescribed: 20
Plan Total Prescribed Dose: 40 Gy
Plan Total Prescribed Dose: 46 Gy
Reference Point Dosage Given to Date: 30 Gy
Reference Point Dosage Given to Date: 34.5 Gy
Reference Point Session Dosage Given: 2 Gy
Reference Point Session Dosage Given: 2.3 Gy
Session Number: 15

## 2021-11-30 NOTE — Progress Notes (Signed)
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 She has completed 15 of 20 planned fractions.  Patient is currently receiving hydrea, allopurinol + aranesp injection q14d for myeloproliferative disorder.   Met with patient and husband in office. She has increased pain to lower lip. Drinking from a large straw has been helpful. Patient reports decreased appetite and diminished taste. She is surprised her weight increased from last week. She continues drinking Boost VHC and Equate HP. Patient is not doing baking soda salt water rinses. She will begin doing this. Husband reports pt is not drinking much water  (~1 L/day). Patient denies nausea, vomiting, diarrhea, constipation.   Medications: reviewed   Labs: no new labs for review  Anthropometrics: Last aria wt 98.4 lb on 10/16 increased   INTERVENTION:  Continue strategies for increasing calories and protein with small frequent meals/snacks Educated on strategies for altered taste, encouraged baking soda salt water rinses several times daily and before meals Continue drinking Boost VHC (530 kcal, 23 grams protein) and Ensure Plus/equivalent for weight maintenance Provided handful of flexible straws from support services     MONITORING, EVALUATION, GOAL: weight trends, intake   NEXT VISIT: To be scheduled with post treatment follow up.

## 2021-12-01 ENCOUNTER — Other Ambulatory Visit: Payer: Self-pay

## 2021-12-01 ENCOUNTER — Inpatient Hospital Stay: Payer: Medicare Other

## 2021-12-01 ENCOUNTER — Telehealth: Payer: Self-pay | Admitting: *Deleted

## 2021-12-01 ENCOUNTER — Ambulatory Visit: Payer: Medicare Other | Attending: Radiation Oncology

## 2021-12-01 ENCOUNTER — Other Ambulatory Visit: Payer: Self-pay | Admitting: *Deleted

## 2021-12-01 ENCOUNTER — Ambulatory Visit
Admission: RE | Admit: 2021-12-01 | Discharge: 2021-12-01 | Disposition: A | Discharge status: Home / Self Care | Payer: Medicare Other | Source: Ambulatory Visit | Attending: Radiation Oncology | Admitting: Radiation Oncology

## 2021-12-01 DIAGNOSIS — R1310 Dysphagia, unspecified: Secondary | ICD-10-CM | POA: Insufficient documentation

## 2021-12-01 DIAGNOSIS — D471 Chronic myeloproliferative disease: Secondary | ICD-10-CM

## 2021-12-01 DIAGNOSIS — D469 Myelodysplastic syndrome, unspecified: Secondary | ICD-10-CM

## 2021-12-01 DIAGNOSIS — D46Z Other myelodysplastic syndromes: Secondary | ICD-10-CM | POA: Diagnosis not present

## 2021-12-01 DIAGNOSIS — C4A0 Merkel cell carcinoma of lip: Secondary | ICD-10-CM | POA: Diagnosis not present

## 2021-12-01 DIAGNOSIS — D75839 Thrombocytosis, unspecified: Secondary | ICD-10-CM

## 2021-12-01 DIAGNOSIS — C4359 Malignant melanoma of other part of trunk: Secondary | ICD-10-CM

## 2021-12-01 DIAGNOSIS — D72829 Elevated white blood cell count, unspecified: Secondary | ICD-10-CM

## 2021-12-01 DIAGNOSIS — C946 Myelodysplastic disease, not classified: Secondary | ICD-10-CM

## 2021-12-01 LAB — CBC WITH DIFFERENTIAL (CANCER CENTER ONLY)
Abs Immature Granulocytes: 20.51 10*3/uL — ABNORMAL HIGH (ref 0.00–0.07)
Basophils Absolute: 0.1 10*3/uL (ref 0.0–0.1)
Basophils Relative: 0 %
Eosinophils Absolute: 0.1 10*3/uL (ref 0.0–0.5)
Eosinophils Relative: 0 %
HCT: 22.8 % — ABNORMAL LOW (ref 36.0–46.0)
Hemoglobin: 7.9 g/dL — ABNORMAL LOW (ref 12.0–15.0)
Immature Granulocytes: 17 %
Lymphocytes Relative: 2 %
Lymphs Abs: 2.6 10*3/uL (ref 0.7–4.0)
MCH: 33.9 pg (ref 26.0–34.0)
MCHC: 34.6 g/dL (ref 30.0–36.0)
MCV: 97.9 fL (ref 80.0–100.0)
Monocytes Absolute: 1 10*3/uL (ref 0.1–1.0)
Monocytes Relative: 1 %
Neutro Abs: 98 10*3/uL — ABNORMAL HIGH (ref 1.7–7.7)
Neutrophils Relative %: 80 %
Platelet Count: 10 10*3/uL — ABNORMAL LOW (ref 150–400)
RBC: 2.33 MIL/uL — ABNORMAL LOW (ref 3.87–5.11)
RDW: 19 % — ABNORMAL HIGH (ref 11.5–15.5)
Smear Review: NORMAL
WBC Count: 122.3 10*3/uL (ref 4.0–10.5)
nRBC: 0 % (ref 0.0–0.2)

## 2021-12-01 LAB — SAMPLE TO BLOOD BANK

## 2021-12-01 LAB — CMP (CANCER CENTER ONLY)
ALT: 54 U/L — ABNORMAL HIGH (ref 0–44)
AST: 82 U/L — ABNORMAL HIGH (ref 15–41)
Albumin: 3.4 g/dL — ABNORMAL LOW (ref 3.5–5.0)
Alkaline Phosphatase: 334 U/L — ABNORMAL HIGH (ref 38–126)
Anion gap: 10 (ref 5–15)
BUN: 34 mg/dL — ABNORMAL HIGH (ref 8–23)
CO2: 25 mmol/L (ref 22–32)
Calcium: 10.4 mg/dL — ABNORMAL HIGH (ref 8.9–10.3)
Chloride: 99 mmol/L (ref 98–111)
Creatinine: 0.77 mg/dL (ref 0.44–1.00)
GFR, Estimated: >60 mL/min (ref >60)
Glucose, Bld: 169 mg/dL — ABNORMAL HIGH (ref 70–99)
Potassium: 4.4 mmol/L (ref 3.5–5.1)
Sodium: 134 mmol/L — ABNORMAL LOW (ref 135–145)
Total Bilirubin: 1 mg/dL (ref 0.3–1.2)
Total Protein: 6.6 g/dL (ref 6.5–8.1)

## 2021-12-01 LAB — RAD ONC ARIA SESSION SUMMARY
Course Elapsed Days: 23
Plan Fractions Treated to Date: 16
Plan Fractions Treated to Date: 16
Plan Prescribed Dose Per Fraction: 2 Gy
Plan Prescribed Dose Per Fraction: 2.3 Gy
Plan Total Fractions Prescribed: 20
Plan Total Fractions Prescribed: 20
Plan Total Prescribed Dose: 40 Gy
Plan Total Prescribed Dose: 46 Gy
Reference Point Dosage Given to Date: 32 Gy
Reference Point Dosage Given to Date: 36.8 Gy
Reference Point Session Dosage Given: 2 Gy
Reference Point Session Dosage Given: 2.3 Gy
Session Number: 16

## 2021-12-01 NOTE — Telephone Encounter (Signed)
See other entry 

## 2021-12-01 NOTE — Telephone Encounter (Signed)
This RN was able to reach pt at 530pm today regarding lab results and need for transfusion- discussed concern regarding increased need for blood products while under current treatment plan per Dr Joan Mayans and that her office has been notified of concern.  Pt is agreeable for transfusion- and due to current time this RN was unable to schedule- pt will await call in am for when she can receive transfusion.

## 2021-12-01 NOTE — Telephone Encounter (Signed)
This RN obtained lab results today of CBC showing WBC increase to 122, heme of 7.9 and plts of 10.  Pt has left the building.  Per MD review concern with WBC increase and current hydrea dose as well as ongoing drop in plts requiring frequent transfusion- concern that pt needs further evaluation by Dr Joan Mayans for further recommendations.  This RN contacted [facility] and spoke with Gae Bon RN in Triage - discussed concerns and lab values Gae Bon was able to pull from [place]) and need for review by Dr Joan Mayans per current treatment and transfusion parimeters with noted plt count on Monday 10/16 of 14 and 1 unit plts given with plt count today of 10.  Gae Bon will forward concerns to Dr Joan Mayans with Dr Rob Hickman cell number given for return call.  This RN contacted pt and obtained her identified VM- message left to return call to discuss labs and possible need for transfusion.

## 2021-12-01 NOTE — Therapy (Signed)
OUTPATIENT SPEECH LANGUAGE PATHOLOGY ONCOLOGY EVALUATION   Patient Name: Tanya Mitchell MRN: [id number] DOB:12/25/1945, 76 y.o., female Today's Date: 12/01/2021  PCP: None REFERRING PROVIDER: Eppie Gibson, MD   End of Session - 12/01/21 1240     Visit Number 2    Number of Visits 4    Date for SLP Re-Evaluation 02/01/22    SLP Start Time 2947    SLP Stop Time  6546    SLP Time Calculation (min) 22 min    Activity Tolerance Patient limited by pain;Patient limited by fatigue             Past Medical History:  Diagnosis Date   Breast cancer ([place]) 1995 and 2007   Cardiomyopathy secondary to chemotherapy [facility])    has improved    Hyperlipidemia    Hypertension    under control, has been on med. x 3 yrs.   Melanoma in situ of back ([place]) 09/2011   left   Personal history of chemotherapy    Personal history of radiation therapy    Skin cancer 2013   Melanoma   Past Surgical History:  Procedure Laterality Date   BREAST LUMPECTOMY Right 12/21/2005   right   MASTECTOMY Left 1995   with breast reconstruction - left   RIGHT/LEFT HEART CATH AND CORONARY ANGIOGRAPHY N/A 04/20/2021   Procedure: RIGHT/LEFT HEART CATH AND CORONARY ANGIOGRAPHY;  Surgeon: Jettie Booze, MD;  Location: [place] [facility];  Service: Cardiovascular;  Laterality: N/A;   Patient Active Problem List   Diagnosis Date Noted   Diplopia 11/22/2021   Abnormal brain MRI 11/22/2021   Encounter for preoperative dental examination 10/31/2021   Teeth missing 10/31/2021   Periodontal disease 10/31/2021   Generalized gingival recession 10/31/2021   Accretions on teeth 10/31/2021   Dental attrition, excessive 10/31/2021   Trismus 10/31/2021   Merkel cell carcinoma of lip ([place]) 50/35/4656   Acute systolic heart failure ([place])    Myeloproliferative disorder ([place]) 11/09/2020   MDS/MPN (myelodysplastic/myeloproliferative neoplasms) ([place]) 05/25/2020   Thrombocytosis 11/28/2017   Leukocytosis  11/28/2017   Breast neoplasm, Tis ([facility]IS), right 01/06/2013   Essential hypertension 05/12/2012   Melanoma of back, pT2NxMx 09/22/2011   Nonischemic cardiomyopathy ([place]) 08/26/2010    ONSET DATE: Spring 2023   REFERRING DIAG: Merkel Cell Carcinoma stage IIIB (T2, N1b, M0)  THERAPY DIAG:  Dysphagia, unspecified type  Rationale for Evaluation and Treatment Rehabilitation  SUBJECTIVE:   SUBJECTIVE STATEMENT: Pt is having liquids currently. Nothing solid. Pt accompanied by: self, and husband  PERTINENT HISTORY: She presented with new right lower lip lesion and right facial swelling approximately 4-5 months ago. 08/23/21 Biopsy of right medial lower lip lesion revealed finding consistent with neuroendocrine carcinoma, favoring merkel cell carcinoma. 09/09/21 PET scan revealed 13 mm hypermetabolic mass within the right lower lip, and hypermetabolic right submandibular and right cervical metastatic adenopathy. PET also showed splenomegaly with mild diffusely elevated metabolic activity throughout the spleen, and multiple tiny pulmonary nodules measuring up to 5 mm without elevated metabolic activity. 09/14/21 Consult with Dr. Nicolette Bang who recommended proceeding with wide excision of the lip lesion (with primary closure) and right neck dissection. 09/22/21 CT neck and thyroid showed the exophytic mass lesion arising from the eccentric right lower lip, measuring 1.9 x 1.9 x 1.8 cm, representing the patient's known Merkel cell carcinoma. CT also showed numerous enlarged right cervical lymph nodes with abnormal morphology, many of which with necrotic changes, involving levels IA, IB, II, III and IV, as  well as the right supraclavicular region. 09/29/21 she proceeded with excision of the lower lip lesion and lymph node dissections with Dr. Nicolette Bang. Pathology from the procedure revealed findings consistent with ulcerated high grade neuroendocrine carcinoma (measuring 2.2 cm) consistent with Merkel cell carcinoma;  all margins negative with carcinoma coming within 1 mm of the right margin) nodal status of 19/20 dissected lymph nodes positive for metastatic carcinoma measuring up to 3.5 cm, all with extranodal extension (positive lymph nodes consisted of 2 submental lymph nodes, 1 right submandibular lymph node, and 16 lymph nodes from levels 2A, 3, 4, and 5). [place] Tumor board recommended consult for systemic therapy and radiation therapy. 10/18/21 Consult with Dr. Chryl Heck. She agreed to proceed with adjuvant immunotherapy nivolumab every 4 weeks. This will be initiated following radiation. 10/21/21 Consult with Dr. Isidore Moos. Treatment plan:  She will receive 20 fractions of hypofractionated radiation to her Lip and bilateral neck. Immunotherapy will start after radiation.  She will start on 11/08/21 and complete 12/05/21.  PAIN:  Are you having pain? Yes, 6/10, throat, soreness - worse when swallowing.     PATIENT GOALS: Maintain WNL swallowing   OBJECTIVE:    TODAY'S TREATMENT:  12/01/21: Pt not completing HEP as prescribed frequency or scope due to s/e from radiation. Pt req'd rare min A for mendelsohn. She drank H2O with sips with straw from lt side without any overt pharyngeal difficulties. None were reported either - by pt or spouse. SLP reminded pt about cycling through exercises and "do what you can" with HEP at this time and then incr reps as she feels better. She demonstrated understanding.   11/03/21: Research states the risk for dysphagia increases due to radiation treatment due to a variety of factors, so SLP educated the pt about the possibility of reduced/limited ability for PO intake during rad tx. SLP also educated pt regarding possible changes to swallowing musculature after rad tx, and why adherence to dysphagia HEP provided today and PO consumption was necessary to inhibit muscle changes following rad tx. SLP informed pt why this would be detrimental to her swallowing status and pulmonary health.  Pt demonstrated understanding of these things to SLP. SLP encouraged pt to safely eat and drink as deep into their radiation/chemotherapy as possible to provide the best possible long-term swallowing outcome for pt.    SLP then developed a HEP for pt involving oral and pharyngeal strengthening and ROM and pt was instructed how to perform these exercises, including SLP demonstration. After SLP demonstration, pt return demonstrated each exercise. SLP ensured pt performance was correct prior to educating pt on next exercise. Pt required SBA faded to modified independent to perform HEP. Pt was instructed to complete this program at least 5 days/week, at least 2 times a day until six months after her last day of radiation, and then x2 a week after that, indefinitely.     PATIENT EDUCATION: Education details: see "today's treatment" Person educated: Patient, spouse Education method: Explanation, demo Education comprehension: verbalized understanding, returned demonstration, and needs further education     ASSESSMENT:   CLINICAL IMPRESSION: Patient is a 76 y/o female who was seen today for treatment of swallowing as she undergoes radiation therapy. Today pt drank thin liquids, as she is only having liquids at this time. At this time pt swallowing is deemed WNL/WFL with liquids. Oral deficits as pt needing to sip with straw from lt labial side, and no overt s/sx pharyngeal deficits, including aspiration, were observed today. There are  no overt s/s aspiration PNA observed by SLP nor any reported by pt at this time. Data indicate that pt's swallow ability will likely decrease over the course of radiation therapy and could very well decline over time following the conclusion of that therapy due to muscle disuse atrophy and/or muscle fibrosis. Pt will cont to need to be seen by SLP in order to assess safety of PO intake, assess the need for recommending any objective swallow assessment, and ensuring pt is  correctly completing the individualized HEP.   OBJECTIVE IMPAIRMENTS include dysphagia. These impairments are limiting patient from safety when swallowing. Factors affecting potential to achieve goals and functional outcome are limited/reduced literacy . Patient will benefit from skilled SLP services to address above impairments and improve overall function.   REHAB POTENTIAL: Good   GOALS: Goals reviewed with patient? No   SHORT TERM GOALS: Target date:  2 therapy visits (visit #3)       pt will complete HEP with rare min A  Baseline: Goal status: Met   2.  pt will tell SLP why pt is completing HEP with modified independence Baseline:  Goal status: Ongoing   3.  pt will describe 3 overt s/s aspiration PNA with modified independence Baseline:  Goal status: Ongoing     LONG TERM GOALS: Target date:6 therapy visits (visit #7)      pt will complete HEP with modified independnence in 2 sessions Baseline:  Goal status: Ongoing   2.  pt will describe how to modify HEP over time, and the timeline associated with reduction in HEP frequency with modified independence over two sessions Baseline:  Goal status: Ongoing     PLAN: SLP FREQUENCY:  once every approx 4 weeks   SLP DURATION:  7 total sessions   PLANNED INTERVENTIONS: Aspiration precaution training, Pharyngeal strengthening exercises, Diet toleration management , Trials of upgraded texture/liquids, Internal/external aids, SLP instruction and feedback, Compensatory strategies, and Patient/family education  [facility], [place] 12/01/2021, 12:47 PM

## 2021-12-02 ENCOUNTER — Ambulatory Visit
Admission: RE | Admit: 2021-12-02 | Discharge: 2021-12-02 | Disposition: A | Discharge status: Home / Self Care | Payer: Medicare Other | Source: Ambulatory Visit | Attending: Radiation Oncology | Admitting: Radiation Oncology

## 2021-12-02 ENCOUNTER — Telehealth: Payer: Self-pay

## 2021-12-02 ENCOUNTER — Inpatient Hospital Stay: Payer: Medicare Other

## 2021-12-02 ENCOUNTER — Inpatient Hospital Stay (HOSPITAL_BASED_OUTPATIENT_CLINIC_OR_DEPARTMENT_OTHER): Payer: Medicare Other | Admitting: Physician Assistant

## 2021-12-02 ENCOUNTER — Other Ambulatory Visit: Payer: Self-pay

## 2021-12-02 ENCOUNTER — Encounter: Payer: Self-pay | Admitting: Physician Assistant

## 2021-12-02 DIAGNOSIS — D46Z Other myelodysplastic syndromes: Secondary | ICD-10-CM | POA: Diagnosis not present

## 2021-12-02 DIAGNOSIS — D471 Chronic myeloproliferative disease: Secondary | ICD-10-CM

## 2021-12-02 DIAGNOSIS — C4A0 Merkel cell carcinoma of lip: Secondary | ICD-10-CM | POA: Diagnosis not present

## 2021-12-02 DIAGNOSIS — D75839 Thrombocytosis, unspecified: Secondary | ICD-10-CM

## 2021-12-02 LAB — RAD ONC ARIA SESSION SUMMARY
Course Elapsed Days: 24
Plan Fractions Treated to Date: 17
Plan Fractions Treated to Date: 17
Plan Prescribed Dose Per Fraction: 2 Gy
Plan Prescribed Dose Per Fraction: 2.3 Gy
Plan Total Fractions Prescribed: 20
Plan Total Fractions Prescribed: 20
Plan Total Prescribed Dose: 40 Gy
Plan Total Prescribed Dose: 46 Gy
Reference Point Dosage Given to Date: 34 Gy
Reference Point Dosage Given to Date: 39.1 Gy
Reference Point Session Dosage Given: 2 Gy
Reference Point Session Dosage Given: 2.3 Gy
Session Number: 17

## 2021-12-02 LAB — PREPARE RBC (CROSSMATCH)

## 2021-12-02 MED ORDER — ACETAMINOPHEN 325 MG PO TABS
650.0000 mg | ORAL_TABLET | Freq: Once | ORAL | Status: AC
  Administered 2021-12-02: 650 mg via ORAL
  Filled 2021-12-02: qty 2

## 2021-12-02 MED ORDER — SODIUM CHLORIDE 0.9% IV SOLUTION
250.0000 mL | Freq: Once | INTRAVENOUS | Status: AC
  Administered 2021-12-02: 250 mL via INTRAVENOUS

## 2021-12-02 MED ORDER — ONDANSETRON HCL 8 MG PO TABS
8.0000 mg | ORAL_TABLET | Freq: Once | ORAL | Status: AC
  Administered 2021-12-02: 8 mg via ORAL
  Filled 2021-12-02: qty 1

## 2021-12-02 MED ORDER — DIPHENHYDRAMINE HCL 25 MG PO CAPS
25.0000 mg | ORAL_CAPSULE | Freq: Once | ORAL | Status: AC
  Administered 2021-12-02: 25 mg via ORAL
  Filled 2021-12-02: qty 1

## 2021-12-02 NOTE — Patient Instructions (Signed)

## 2021-12-02 NOTE — Progress Notes (Signed)
Approximately 5 minutes into the fast rate of platelet transfusion, patient complained of pain at IV site. Infusion paused and IV site assessed. Prior to this, patient denied any pain and no swelling/redness was noted at IV site. Upon assessment, there was no redness at site but patient's entire forearm was swollen and tight. Patient's R hand was noted to be paler than her other hand and no pulse was detected in R wrist. Lisabeth Devoid., PA immediately to chairside and pulse was detected using pencil doppler. Patient declined pain but did report her hand felt cold. IV removed and site was covered with heat and compressed. IV team to chairside to place new IV. Remainder of platelets transfused with no difficulties.  Strong pulse noted in R wrist, positive cap refill at 1226.  Upon leaving [facility], patient's pulse in R wrist was strong and cap refill noted. Patient declined pain or discomfort and she was instructed to keep heat on the IV site and to call [facility] with any changes.

## 2021-12-02 NOTE — Telephone Encounter (Signed)
RN contacted patient's husband who confirmed they will be here for blood transfusion this AM at 0900. Appointment scheduled.

## 2021-12-02 NOTE — Progress Notes (Signed)
Briefly this is a 76 year old female with oncologic history to include MPN, Merkel cell carcinoma and remote left breast history comanaged by [facility] Dr. Joan Mayans and Dr. Chryl Heck.  Patient was scheduled to receive 1 unit of blood and platelets today in symptom management clinic.  I was called to the chair side when patient developed swelling in her right forearm approximately 5 minutes into the fast rate transfusion when RN noticed swelling and patient was complaining of an onset pain.  On my exam patient had swelling in her right forearm and right fingers were pale.  It was difficult to help a radial pulse so Doppler was immediately used and able to detect pulse.  Heat and compression was applied.  Patient reassessed and swelling had significantly improved and color returned to normal. She had a palpable radial pulse and brisk cap refill.  She was able to have IV placed by IV team and complete transfusions as scheduled.  I informed oncologist Dr. Chryl Heck of infiltration and she agreed with plan of care. She will see patient next week for recheck and to discuss [place].  Right upper extremity was neurovascularly intact at time of discharge and pain had resolved.

## 2021-12-05 ENCOUNTER — Inpatient Hospital Stay: Payer: Medicare Other

## 2021-12-05 ENCOUNTER — Ambulatory Visit: Admission: RE | Admit: 2021-12-05 | Payer: Medicare Other | Source: Ambulatory Visit

## 2021-12-05 ENCOUNTER — Encounter: Payer: Self-pay | Admitting: *Deleted

## 2021-12-05 ENCOUNTER — Other Ambulatory Visit: Payer: Self-pay

## 2021-12-05 ENCOUNTER — Ambulatory Visit: Payer: Medicare Other

## 2021-12-05 ENCOUNTER — Encounter: Payer: Self-pay | Admitting: Hematology and Oncology

## 2021-12-05 ENCOUNTER — Emergency Department (HOSPITAL_COMMUNITY)
Admission: EM | Admit: 2021-12-05 | Discharge: 2021-12-05 | Disposition: A | Discharge status: Home / Self Care | Payer: Medicare Other | Source: Home / Self Care | Attending: Emergency Medicine | Admitting: Emergency Medicine

## 2021-12-05 ENCOUNTER — Encounter (HOSPITAL_COMMUNITY): Payer: Self-pay | Admitting: Emergency Medicine

## 2021-12-05 ENCOUNTER — Emergency Department (HOSPITAL_COMMUNITY): Payer: Medicare Other

## 2021-12-05 DIAGNOSIS — E860 Dehydration: Secondary | ICD-10-CM | POA: Insufficient documentation

## 2021-12-05 DIAGNOSIS — R000 Tachycardia, unspecified: Secondary | ICD-10-CM | POA: Insufficient documentation

## 2021-12-05 DIAGNOSIS — D696 Thrombocytopenia, unspecified: Secondary | ICD-10-CM | POA: Diagnosis not present

## 2021-12-05 DIAGNOSIS — A419 Sepsis, unspecified organism: Secondary | ICD-10-CM | POA: Diagnosis not present

## 2021-12-05 DIAGNOSIS — I959 Hypotension, unspecified: Secondary | ICD-10-CM | POA: Insufficient documentation

## 2021-12-05 LAB — COMPREHENSIVE METABOLIC PANEL
ALT: 141 U/L — ABNORMAL HIGH (ref 0–44)
AST: 177 U/L — ABNORMAL HIGH (ref 15–41)
Albumin: 3.1 g/dL — ABNORMAL LOW (ref 3.5–5.0)
Alkaline Phosphatase: 344 U/L — ABNORMAL HIGH (ref 38–126)
Anion gap: 15 (ref 5–15)
BUN: 58 mg/dL — ABNORMAL HIGH (ref 8–23)
CO2: 22 mmol/L (ref 22–32)
Calcium: 11.6 mg/dL — ABNORMAL HIGH (ref 8.9–10.3)
Chloride: 102 mmol/L (ref 98–111)
Creatinine, Ser: 0.8 mg/dL (ref 0.44–1.00)
GFR, Estimated: >60 mL/min (ref >60)
Glucose, Bld: 203 mg/dL — ABNORMAL HIGH (ref 70–99)
Potassium: 4.6 mmol/L (ref 3.5–5.1)
Sodium: 139 mmol/L (ref 135–145)
Total Bilirubin: 1.6 mg/dL — ABNORMAL HIGH (ref 0.3–1.2)
Total Protein: 6.6 g/dL (ref 6.5–8.1)

## 2021-12-05 LAB — BPAM RBC
Blood Product Expiration Date: 202310242359
ISSUE DATE / TIME: 202310201224
Unit Type and Rh: 600

## 2021-12-05 LAB — TYPE AND SCREEN
ABO/RH(D): AB NEG
Antibody Screen: NEGATIVE
Unit division: 0

## 2021-12-05 LAB — CBC WITH DIFFERENTIAL/PLATELET
Abs Immature Granulocytes: 25.36 10*3/uL — ABNORMAL HIGH (ref 0.00–0.07)
Band Neutrophils: 5 %
Basophils Absolute: 0 10*3/uL (ref 0.0–0.1)
Basophils Relative: 0 %
Blasts: 0 %
Eosinophils Absolute: 0 10*3/uL (ref 0.0–0.5)
Eosinophils Relative: 0 %
HCT: 23.1 % — ABNORMAL LOW (ref 36.0–46.0)
Hemoglobin: 7.7 g/dL — ABNORMAL LOW (ref 12.0–15.0)
Lymphocytes Relative: 2 %
Lymphs Abs: 2.5 10*3/uL (ref 0.7–4.0)
MCH: 33 pg (ref 26.0–34.0)
MCHC: 33.3 g/dL (ref 30.0–36.0)
MCV: 99.1 fL (ref 80.0–100.0)
Metamyelocytes Relative: 1 %
Monocytes Absolute: 1.3 10*3/uL — ABNORMAL HIGH (ref 0.1–1.0)
Monocytes Relative: 1 %
Myelocytes: 15 %
Neutro Abs: 97.6 10*3/uL — ABNORMAL HIGH (ref 1.7–7.7)
Neutrophils Relative %: 72 %
Other: 0 %
Platelets: 10 10*3/uL — CL (ref 150–400)
Promyelocytes Relative: 4 %
RBC: 2.33 MIL/uL — ABNORMAL LOW (ref 3.87–5.11)
RDW: 18.8 % — ABNORMAL HIGH (ref 11.5–15.5)
WBC: 126.8 10*3/uL (ref 4.0–10.5)
nRBC: 0 % (ref 0.0–0.2)
nRBC: 0 /100 WBC

## 2021-12-05 LAB — PREPARE PLATELET PHERESIS: Unit division: 0

## 2021-12-05 LAB — BPAM PLATELET PHERESIS
Blood Product Expiration Date: 202310242359
ISSUE DATE / TIME: 202310201046
Unit Type and Rh: 600

## 2021-12-05 LAB — APTT: aPTT: 31 seconds (ref 24–36)

## 2021-12-05 LAB — PROTIME-INR
INR: 1.2 (ref 0.8–1.2)
Prothrombin Time: 15.5 seconds — ABNORMAL HIGH (ref 11.4–15.2)

## 2021-12-05 LAB — LACTIC ACID, PLASMA
Lactic Acid, Venous: 6.8 mmol/L (ref 0.5–1.9)
Lactic Acid, Venous: 2.8 mmol/L (ref 0.5–1.9)

## 2021-12-05 MED ORDER — LIDOCAINE VISCOUS HCL 2 % MT SOLN: 5.0000 mL | Freq: Three times a day (TID) | OROMUCOSAL | 0 refills | Status: AC | PRN

## 2021-12-05 MED ORDER — SODIUM CHLORIDE 0.9 % IV BOLUS
1000.0000 mL | Freq: Once | INTRAVENOUS | Status: AC
  Administered 2021-12-05: 1000 mL via INTRAVENOUS

## 2021-12-05 MED ORDER — SODIUM CHLORIDE 0.9 % IV BOLUS
500.0000 mL | Freq: Once | INTRAVENOUS | Status: AC
  Administered 2021-12-05: 500 mL via INTRAVENOUS

## 2021-12-05 MED ORDER — LIDOCAINE VISCOUS HCL 2 % MT SOLN: 5.0000 mL | Freq: Three times a day (TID) | OROMUCOSAL | 0 refills | Status: DC | PRN

## 2021-12-05 NOTE — Progress Notes (Signed)
error 

## 2021-12-05 NOTE — ED Provider Notes (Signed)
[facility] DEPT Provider Note   CSN: [id number] Arrival date & time: 12/05/21  1408     History  Chief Complaint  Patient presents with   Hypotension    Kaylani Fromme is a 76 y.o. female presenting from cancer center with hypotension. Hx of beast cancel and merkel cell, currently undergoing radiation therapy.  She is here with her husband for supplemental history.  He reports that generally has been doing well with fluids for the past week has been having a difficult time due to the pain that she is having with swallowing, which is felt to be a side effect of radiation.  Her blood pressure was noted to be low today.  The patient has no acute complaints.  HPI     Home Medications Prior to Admission medications   Medication Sig Start Date End Date Taking? Authorizing Provider  allopurinol (ZYLOPRIM) 300 MG tablet Take 1 tablet (300 mg total) by mouth daily. 11/09/20   Magrinat, Virgie Dad, MD  aspirin EC 81 MG tablet Take 1 tablet (81 mg total) by mouth daily. Swallow whole. 04/15/21   Freada Bergeron, MD  atorvastatin (LIPITOR) 80 MG tablet Take 1 tablet (80 mg total) by mouth daily. 07/18/21   Freada Bergeron, MD  cholecalciferol (VITAMIN D) 1000 UNITS tablet Take 2,000 Units by mouth in the morning.    [provider]  dapagliflozin propanediol (FARXIGA) 10 MG TABS tablet Take 1 tablet (10 mg total) by mouth daily before breakfast. 11/07/21   Freada Bergeron, MD  fluconazole (DIFLUCAN) 100 MG tablet Take 2 tablets today, then 1 tablet daily x 20 more days. Hold Atorvastatin while on this. 11/28/21   Eppie Gibson, MD  furosemide (LASIX) 40 MG tablet Take 1 tablet (40 mg total) by mouth daily as needed. 04/06/21   Jettie Booze, MD  hydroxyurea (HYDREA) 500 MG capsule Take 500 mg by mouth daily. In the evening. May take with food to minimize GI side effects. 08/09/20   Magrinat, Virgie Dad, MD  magic mouthwash (lidocaine,  diphenhydrAMINE, alum & mag hydroxide) suspension Swish and swallow 5 mLs 3 (three) times daily as needed for up to 10 days for mouth pain. Do not exceed more than the allotted dose.  Long-term use of this medicine can cause danger of arrhythmias. 12/05/21 12/15/21  Wyvonnia Dusky, MD  Magnesium 400 MG TABS Take 1 tablet by mouth daily as needed. Only take on days taking furosemide 04/06/21   Jettie Booze, MD  metFORMIN (GLUCOPHAGE) 500 MG tablet Take 0.5 tablets (250 mg total) by mouth in the morning and at bedtime. 04/22/21   Jettie Booze, MD  metoprolol succinate (TOPROL XL) 25 MG 24 hr tablet Take 0.5 tablets (12.5 mg total) by mouth at bedtime. Please hold this med if systolic BP is 90 or less. 07/28/21   Freada Bergeron, MD  potassium chloride SA (KLOR-CON M) 20 MEQ tablet Take 1 tablet (20 mEq total) by mouth daily as needed. Only on days taking furosemide 04/06/21   Jettie Booze, MD  sacubitril-valsartan (ENTRESTO) 24-26 MG Take 1 tablet by mouth 2 (two) times daily. 11/25/21   Freada Bergeron, MD  sodium fluoride (SODIUM FLUORIDE 5000 PLUS) 1.1 % CREA dental cream Place pea-size amount onto toothbrush every evening before bedtime to brush teeth. Do NOT rinse with water, eat or drink for at least 30 minutes after use. 10/31/21   Owsley, Debe Coder B, DMD  triamcinolone (KENALOG)  0.025 % ointment Apply topically as needed. 03/24/21   [provider]      Allergies    Patient has no known allergies.    Review of Systems   Review of Systems  Physical Exam Updated Vital Signs BP (!) 101/59   Pulse (!) 117   Temp 98 F (36.7 C) (Axillary)   Resp 16   SpO2 96%  Physical Exam Constitutional:      General: She is not in acute distress.    Comments: Patient appears thin, cachectic  HENT:     Head: Normocephalic and atraumatic.     Mouth/Throat:     Comments: Tacky mucous membranes Eyes:     Conjunctiva/sclera: Conjunctivae normal.     Pupils: Pupils  are equal, round, and reactive to light.  Cardiovascular:     Rate and Rhythm: Regular rhythm. Tachycardia present.  Pulmonary:     Effort: Pulmonary effort is normal. No respiratory distress.  Abdominal:     General: There is no distension.     Tenderness: There is no abdominal tenderness.  Skin:    General: Skin is warm and dry.  Neurological:     General: No focal deficit present.     Mental Status: She is alert. Mental status is at baseline.     ED Results / Procedures / Treatments   Labs (all labs ordered are listed, but only abnormal results are displayed) Labs Reviewed  LACTIC ACID, PLASMA - Abnormal; Notable for the following components:      Result Value   Lactic Acid, Venous 6.8 (*)    All other components within normal limits  LACTIC ACID, PLASMA - Abnormal; Notable for the following components:   Lactic Acid, Venous 2.8 (*)    All other components within normal limits  COMPREHENSIVE METABOLIC PANEL - Abnormal; Notable for the following components:   Glucose, Bld 203 (*)    BUN 58 (*)    Calcium 11.6 (*)    Albumin 3.1 (*)    AST 177 (*)    ALT 141 (*)    Alkaline Phosphatase 344 (*)    Total Bilirubin 1.6 (*)    All other components within normal limits  CBC WITH DIFFERENTIAL/PLATELET - Abnormal; Notable for the following components:   WBC 126.8 (*)    RBC 2.33 (*)    Hemoglobin 7.7 (*)    HCT 23.1 (*)    RDW 18.8 (*)    Platelets 10 (*)    Neutro Abs 97.6 (*)    Monocytes Absolute 1.3 (*)    Abs Immature Granulocytes 25.36 (*)    All other components within normal limits  PROTIME-INR - Abnormal; Notable for the following components:   Prothrombin Time 15.5 (*)    All other components within normal limits  CULTURE, BLOOD (ROUTINE X 2)  CULTURE, BLOOD (ROUTINE X 2)  URINE CULTURE  APTT  URINALYSIS, ROUTINE W REFLEX MICROSCOPIC    EKG EKG Interpretation  Date/Time:  Monday December 05 2021 14:42:30 EDT Ventricular Rate:  112 PR  Interval:  186 QRS Duration: 101 QT Interval:  324 QTC Calculation: 443 R Axis:   -26 Text Interpretation: Sinus tachycardia Borderline left axis deviation Anteroseptal infarct, old Nonspecific T abnormalities, lateral leads Confirmed by Octaviano Glow [phone]) on 12/05/2021 3:09:43 PM  Radiology DG Chest Port 1 View  Result Date: 12/05/2021 CLINICAL DATA:  Hypotension. Decreased oral intake. Personal history of melanoma and breast cancer. EXAM: PORTABLE CHEST 1 VIEW COMPARISON:  Two-view chest  x-ray 10/05/2011 FINDINGS: Heart size is normal. Lung volumes are low. No focal airspace disease is present. No edema or effusion is present. Postoperative changes are present in the left axilla and over the left chest. IMPRESSION: 1. Low lung volumes. 2. No acute cardiopulmonary disease. Electronically Signed   By: San Morelle M.D.   On: 12/05/2021 15:11    Procedures Procedures    Medications Ordered in ED Medications  sodium chloride 0.9 % bolus 500 mL (0 mLs Intravenous Stopped 12/05/21 1515)  sodium chloride 0.9 % bolus 1,000 mL (0 mLs Intravenous Stopped 12/05/21 1813)    ED Course/ Medical Decision Making/ A&P Clinical Course as of 12/05/21 2122  Mon Dec 05, 2021  1529 Lactic Acid, Venous(!!): 6.8 [MT]  1600 BUN(!): 58 [MT]  1600 Elevated BUN and lactate are still consistent with dehydration.  Patient's blood pressure appears to be improving with IV fluids. [MT]  1601 No clear source of infection from chest x-ray or per her history to suggest sepsis. [MT]  [place] to collect repeat lactate now, 2 liters fluids given [MT]  1903 Lactic Acid, Venous(!!): 2.8 [MT]  1903 Significant treatment of blood pressure and lactate with IV fluids [MT]  1925 Patient having significantly improvement of BP and symptoms after fluids.  Okay for discharge - mouthwash prescribed for odynophagia.  Her husband and patient verbalized understanding. [MT]  1933 CBC-chronic leukocytosis, perhaps  even more hemoconcentrated, chronic anemia and thrombocytopenia [MT]    Clinical Course User Index [MT] Leevi Cullars, Carola Rhine, MD                           Medical Decision Making Amount and/or Complexity of Data Reviewed Labs:  Decision-making details documented in ED Course.   This patient presents to the ED with concern for hypotension. This involves an extensive number of treatment options, and is a complaint that carries with it a high risk of complications and morbidity.  The differential diagnosis includes poor fluid intake vs infection vs anemia vs other  Co-morbidities that complicate the patient evaluation: poor fluid intake from radiation treatment, at risk for dehydraiton  Afebrile on arrival.  No clear evidence of PE or sepsis at this time.  Additional history obtained from patient's husband  External records from outside source obtained and reviewed including oncology office notes  I ordered and personally interpreted labs.  The pertinent results include: Lactate significant elevated and improved after IV fluids.  White blood cell count chronically elevated.  Chronic anemia and thrombocytopenia.  I ordered imaging studies including dg chest I independently visualized and interpreted imaging which showed no acute abnormalities I agree with the radiologist interpretation  The patient was maintained on a cardiac monitor.  I personally viewed and interpreted the cardiac monitored which showed an underlying rhythm of: sinus tachycardia  Per my interpretation the patient's ECG shows sinus tachycardia  I ordered medication including IV fluids for hypotension  I have reviewed the patients home medicines and have made adjustments as needed  Test Considered: Low suspicion for PE, sepsis, cardiogenic shock.  After the interventions noted above, I reevaluated the patient and found that they have: improved   Dispostion:  After consideration of the diagnostic results and the  patients response to treatment, I feel that the patent would benefit from close outpatient follow-up         Final Clinical Impression(s) / ED Diagnoses Final diagnoses:  Dehydration    Rx / DC  Orders ED Discharge Orders          Ordered    magic mouthwash (lidocaine, diphenhydrAMINE, alum & mag hydroxide) suspension  3 times daily PRN,   Status:  Discontinued        12/05/21 1936    magic mouthwash (lidocaine, diphenhydrAMINE, alum & mag hydroxide) suspension  3 times daily PRN        12/05/21 1937              Wyvonnia Dusky, MD 12/05/21 2122

## 2021-12-05 NOTE — Discharge Instructions (Addendum)
Please make an effort to drink water as much as possible at home.  Schedule follow-up appointment with your oncologist as soon as possible.  Contact their office tomorrow to let them know that you are in the emergency room today for dehydration and weakness.

## 2021-12-05 NOTE — Progress Notes (Signed)
Patient was seen in dressing room by Dr. Isidore Moos. Vitals were 85/51 bp pulse 124 oxygen 100% room air RR 16. Patient was taken to the Ed  by [employer].

## 2021-12-05 NOTE — ED Triage Notes (Signed)
Pt arriving from cancer center. Pt was supposed to receive radiation tx today. BP at cancer center was 85/51. Pt has had decreased oral intake over the last 3-5 days. Pt A&O x4 upon arrival.

## 2021-12-06 ENCOUNTER — Other Ambulatory Visit: Payer: Self-pay

## 2021-12-06 ENCOUNTER — Telehealth: Payer: Self-pay | Admitting: *Deleted

## 2021-12-06 ENCOUNTER — Other Ambulatory Visit: Payer: Self-pay | Admitting: *Deleted

## 2021-12-06 ENCOUNTER — Ambulatory Visit: Payer: Medicare Other

## 2021-12-06 ENCOUNTER — Inpatient Hospital Stay: Payer: Medicare Other

## 2021-12-06 ENCOUNTER — Emergency Department (HOSPITAL_COMMUNITY): Payer: Medicare Other

## 2021-12-06 ENCOUNTER — Encounter: Payer: Self-pay | Admitting: Physician Assistant

## 2021-12-06 ENCOUNTER — Inpatient Hospital Stay (HOSPITAL_COMMUNITY)
Admission: EM | Admit: 2021-12-06 | Discharge: 2021-12-14 | DRG: 871 | Disposition: E | Payer: Medicare Other | Source: Ambulatory Visit | Attending: Pulmonary Disease | Admitting: Pulmonary Disease

## 2021-12-06 ENCOUNTER — Telehealth: Payer: Self-pay

## 2021-12-06 ENCOUNTER — Encounter: Payer: Self-pay | Admitting: Hematology and Oncology

## 2021-12-06 ENCOUNTER — Inpatient Hospital Stay (HOSPITAL_BASED_OUTPATIENT_CLINIC_OR_DEPARTMENT_OTHER): Payer: Medicare Other | Admitting: Hematology and Oncology

## 2021-12-06 ENCOUNTER — Encounter (HOSPITAL_COMMUNITY): Payer: Self-pay

## 2021-12-06 VITALS — BP 100/45 | HR 98 | Temp 98.1°F | Resp 16

## 2021-12-06 DIAGNOSIS — Z1152 Encounter for screening for COVID-19: Secondary | ICD-10-CM

## 2021-12-06 DIAGNOSIS — D72829 Elevated white blood cell count, unspecified: Secondary | ICD-10-CM

## 2021-12-06 DIAGNOSIS — Z66 Do not resuscitate: Secondary | ICD-10-CM | POA: Diagnosis present

## 2021-12-06 DIAGNOSIS — D471 Chronic myeloproliferative disease: Secondary | ICD-10-CM

## 2021-12-06 DIAGNOSIS — C4A0 Merkel cell carcinoma of lip: Secondary | ICD-10-CM | POA: Diagnosis present

## 2021-12-06 DIAGNOSIS — D75839 Thrombocytosis, unspecified: Secondary | ICD-10-CM

## 2021-12-06 DIAGNOSIS — A419 Sepsis, unspecified organism: Principal | ICD-10-CM | POA: Diagnosis present

## 2021-12-06 DIAGNOSIS — D469 Myelodysplastic syndrome, unspecified: Secondary | ICD-10-CM

## 2021-12-06 DIAGNOSIS — Z923 Personal history of irradiation: Secondary | ICD-10-CM

## 2021-12-06 DIAGNOSIS — E785 Hyperlipidemia, unspecified: Secondary | ICD-10-CM | POA: Diagnosis present

## 2021-12-06 DIAGNOSIS — Z9221 Personal history of antineoplastic chemotherapy: Secondary | ICD-10-CM

## 2021-12-06 DIAGNOSIS — R1310 Dysphagia, unspecified: Secondary | ICD-10-CM | POA: Diagnosis present

## 2021-12-06 DIAGNOSIS — E43 Unspecified severe protein-calorie malnutrition: Secondary | ICD-10-CM | POA: Diagnosis present

## 2021-12-06 DIAGNOSIS — R590 Localized enlarged lymph nodes: Secondary | ICD-10-CM | POA: Diagnosis present

## 2021-12-06 DIAGNOSIS — T451X5A Adverse effect of antineoplastic and immunosuppressive drugs, initial encounter: Secondary | ICD-10-CM | POA: Diagnosis present

## 2021-12-06 DIAGNOSIS — R64 Cachexia: Secondary | ICD-10-CM | POA: Diagnosis present

## 2021-12-06 DIAGNOSIS — E860 Dehydration: Secondary | ICD-10-CM | POA: Diagnosis present

## 2021-12-06 DIAGNOSIS — Z85828 Personal history of other malignant neoplasm of skin: Secondary | ICD-10-CM

## 2021-12-06 DIAGNOSIS — Y842 Radiological procedure and radiotherapy as the cause of abnormal reaction of the patient, or of later complication, without mention of misadventure at the time of the procedure: Secondary | ICD-10-CM | POA: Diagnosis present

## 2021-12-06 DIAGNOSIS — W19XXXA Unspecified fall, initial encounter: Secondary | ICD-10-CM | POA: Diagnosis present

## 2021-12-06 DIAGNOSIS — I10 Essential (primary) hypertension: Secondary | ICD-10-CM | POA: Diagnosis present

## 2021-12-06 DIAGNOSIS — D696 Thrombocytopenia, unspecified: Secondary | ICD-10-CM | POA: Diagnosis present

## 2021-12-06 DIAGNOSIS — Z7189 Other specified counseling: Secondary | ICD-10-CM | POA: Diagnosis not present

## 2021-12-06 DIAGNOSIS — Z86006 Personal history of melanoma in-situ: Secondary | ICD-10-CM

## 2021-12-06 DIAGNOSIS — D6481 Anemia due to antineoplastic chemotherapy: Secondary | ICD-10-CM

## 2021-12-06 DIAGNOSIS — Y92099 Unspecified place in other non-institutional residence as the place of occurrence of the external cause: Secondary | ICD-10-CM

## 2021-12-06 DIAGNOSIS — R6521 Severe sepsis with septic shock: Secondary | ICD-10-CM | POA: Diagnosis present

## 2021-12-06 DIAGNOSIS — Z515 Encounter for palliative care: Secondary | ICD-10-CM | POA: Diagnosis not present

## 2021-12-06 DIAGNOSIS — R5381 Other malaise: Secondary | ICD-10-CM | POA: Diagnosis not present

## 2021-12-06 DIAGNOSIS — F419 Anxiety disorder, unspecified: Secondary | ICD-10-CM | POA: Diagnosis present

## 2021-12-06 DIAGNOSIS — C787 Secondary malignant neoplasm of liver and intrahepatic bile duct: Secondary | ICD-10-CM | POA: Diagnosis present

## 2021-12-06 DIAGNOSIS — R7989 Other specified abnormal findings of blood chemistry: Secondary | ICD-10-CM | POA: Diagnosis present

## 2021-12-06 DIAGNOSIS — R579 Shock, unspecified: Secondary | ICD-10-CM

## 2021-12-06 DIAGNOSIS — D649 Anemia, unspecified: Secondary | ICD-10-CM

## 2021-12-06 DIAGNOSIS — I427 Cardiomyopathy due to drug and external agent: Secondary | ICD-10-CM | POA: Diagnosis present

## 2021-12-06 DIAGNOSIS — R571 Hypovolemic shock: Secondary | ICD-10-CM | POA: Diagnosis present

## 2021-12-06 DIAGNOSIS — Z7982 Long term (current) use of aspirin: Secondary | ICD-10-CM

## 2021-12-06 DIAGNOSIS — Z85820 Personal history of malignant melanoma of skin: Secondary | ICD-10-CM

## 2021-12-06 DIAGNOSIS — Z681 Body mass index (BMI) 19 or less, adult: Secondary | ICD-10-CM | POA: Diagnosis not present

## 2021-12-06 DIAGNOSIS — C7889 Secondary malignant neoplasm of other digestive organs: Secondary | ICD-10-CM | POA: Diagnosis present

## 2021-12-06 DIAGNOSIS — Z853 Personal history of malignant neoplasm of breast: Secondary | ICD-10-CM

## 2021-12-06 DIAGNOSIS — D62 Acute posthemorrhagic anemia: Secondary | ICD-10-CM | POA: Diagnosis present

## 2021-12-06 DIAGNOSIS — Z79899 Other long term (current) drug therapy: Secondary | ICD-10-CM

## 2021-12-06 DIAGNOSIS — Z7984 Long term (current) use of oral hypoglycemic drugs: Secondary | ICD-10-CM

## 2021-12-06 DIAGNOSIS — C4359 Malignant melanoma of other part of trunk: Secondary | ICD-10-CM

## 2021-12-06 DIAGNOSIS — E861 Hypovolemia: Secondary | ICD-10-CM | POA: Diagnosis present

## 2021-12-06 DIAGNOSIS — C4A9 Merkel cell carcinoma, unspecified: Secondary | ICD-10-CM

## 2021-12-06 DIAGNOSIS — Z9012 Acquired absence of left breast and nipple: Secondary | ICD-10-CM

## 2021-12-06 LAB — CBC WITH DIFFERENTIAL (CANCER CENTER ONLY)
Abs Immature Granulocytes: 22.76 10*3/uL — ABNORMAL HIGH (ref 0.00–0.07)
Basophils Absolute: 0.1 10*3/uL (ref 0.0–0.1)
Basophils Relative: 0 %
Eosinophils Absolute: 0 10*3/uL (ref 0.0–0.5)
Eosinophils Relative: 0 %
HCT: 18 % — ABNORMAL LOW (ref 36.0–46.0)
Hemoglobin: 6.2 g/dL — CL (ref 12.0–15.0)
Immature Granulocytes: 19 %
Lymphocytes Relative: 2 %
Lymphs Abs: 2 10*3/uL (ref 0.7–4.0)
MCH: 33.2 pg (ref 26.0–34.0)
MCHC: 34.4 g/dL (ref 30.0–36.0)
MCV: 96.3 fL (ref 80.0–100.0)
Monocytes Absolute: 1.4 10*3/uL — ABNORMAL HIGH (ref 0.1–1.0)
Monocytes Relative: 1 %
Neutro Abs: 91.2 10*3/uL — ABNORMAL HIGH (ref 1.7–7.7)
Neutrophils Relative %: 78 %
Platelet Count: 8 10*3/uL — CL (ref 150–400)
RBC: 1.87 MIL/uL — ABNORMAL LOW (ref 3.87–5.11)
RDW: 18.6 % — ABNORMAL HIGH (ref 11.5–15.5)
Smear Review: NORMAL
WBC Count: 117.4 10*3/uL (ref 4.0–10.5)
nRBC: 0 % (ref 0.0–0.2)

## 2021-12-06 LAB — CBC
HCT: 17.7 % — ABNORMAL LOW (ref 36.0–46.0)
Hemoglobin: 5.8 g/dL — CL (ref 12.0–15.0)
MCH: 32.8 pg (ref 26.0–34.0)
MCHC: 32.8 g/dL (ref 30.0–36.0)
MCV: 100 fL (ref 80.0–100.0)
Platelets: 9 10*3/uL — CL (ref 150–400)
RBC: 1.77 MIL/uL — ABNORMAL LOW (ref 3.87–5.11)
RDW: 18.7 % — ABNORMAL HIGH (ref 11.5–15.5)
WBC: 114.6 10*3/uL (ref 4.0–10.5)
nRBC: 0 % (ref 0.0–0.2)

## 2021-12-06 LAB — SAMPLE TO BLOOD BANK

## 2021-12-06 LAB — APTT: aPTT: 33 seconds (ref 24–36)

## 2021-12-06 LAB — COMPREHENSIVE METABOLIC PANEL
ALT: 172 U/L — ABNORMAL HIGH (ref 0–44)
AST: 258 U/L — ABNORMAL HIGH (ref 15–41)
Albumin: 2.6 g/dL — ABNORMAL LOW (ref 3.5–5.0)
Alkaline Phosphatase: 259 U/L — ABNORMAL HIGH (ref 38–126)
Anion gap: 11 (ref 5–15)
BUN: 51 mg/dL — ABNORMAL HIGH (ref 8–23)
CO2: 20 mmol/L — ABNORMAL LOW (ref 22–32)
Calcium: 10.5 mg/dL — ABNORMAL HIGH (ref 8.9–10.3)
Chloride: 109 mmol/L (ref 98–111)
Creatinine, Ser: 0.47 mg/dL (ref 0.44–1.00)
GFR, Estimated: >60 mL/min (ref >60)
Glucose, Bld: 137 mg/dL — ABNORMAL HIGH (ref 70–99)
Potassium: 4.4 mmol/L (ref 3.5–5.1)
Sodium: 140 mmol/L (ref 135–145)
Total Bilirubin: 1.7 mg/dL — ABNORMAL HIGH (ref 0.3–1.2)
Total Protein: 5.4 g/dL — ABNORMAL LOW (ref 6.5–8.1)

## 2021-12-06 LAB — CMP (CANCER CENTER ONLY)
ALT: 161 U/L — ABNORMAL HIGH (ref 0–44)
AST: 240 U/L (ref 15–41)
Albumin: 2.9 g/dL — ABNORMAL LOW (ref 3.5–5.0)
Alkaline Phosphatase: 278 U/L — ABNORMAL HIGH (ref 38–126)
Anion gap: 8 (ref 5–15)
BUN: 49 mg/dL — ABNORMAL HIGH (ref 8–23)
CO2: 24 mmol/L (ref 22–32)
Calcium: 11.2 mg/dL — ABNORMAL HIGH (ref 8.9–10.3)
Chloride: 109 mmol/L (ref 98–111)
Creatinine: 0.65 mg/dL (ref 0.44–1.00)
GFR, Estimated: >60 mL/min (ref >60)
Glucose, Bld: 145 mg/dL — ABNORMAL HIGH (ref 70–99)
Potassium: 4.5 mmol/L (ref 3.5–5.1)
Sodium: 141 mmol/L (ref 135–145)
Total Bilirubin: 1.7 mg/dL — ABNORMAL HIGH (ref 0.3–1.2)
Total Protein: 5.4 g/dL — ABNORMAL LOW (ref 6.5–8.1)

## 2021-12-06 LAB — BRAIN NATRIURETIC PEPTIDE: B Natriuretic Peptide: 194.5 pg/mL — ABNORMAL HIGH (ref 0.0–100.0)

## 2021-12-06 LAB — RESP PANEL BY RT-PCR (FLU A&B, COVID) ARPGX2
Influenza A by PCR: NEGATIVE
Influenza B by PCR: NEGATIVE
SARS Coronavirus 2 by RT PCR: NEGATIVE

## 2021-12-06 LAB — PROTIME-INR
INR: 1.5 — ABNORMAL HIGH (ref 0.8–1.2)
Prothrombin Time: 18.2 seconds — ABNORMAL HIGH (ref 11.4–15.2)

## 2021-12-06 LAB — PREPARE RBC (CROSSMATCH)

## 2021-12-06 LAB — LACTIC ACID, PLASMA
Lactic Acid, Venous: 4.8 mmol/L (ref 0.5–1.9)
Lactic Acid, Venous: 5.4 mmol/L (ref 0.5–1.9)
Lactic Acid, Venous: 4.8 mmol/L (ref 0.5–1.9)

## 2021-12-06 MED ORDER — SODIUM CHLORIDE 0.9 % IV SOLN: 10.0000 mL/h | Freq: Once | INTRAVENOUS | Status: DC

## 2021-12-06 MED ORDER — FENTANYL CITRATE PF 50 MCG/ML IJ SOSY
25.0000 ug | PREFILLED_SYRINGE | INTRAMUSCULAR | Status: DC | PRN
  Administered 2021-12-06 – 2021-12-07 (×5): 25 ug via INTRAVENOUS
  Filled 2021-12-06 (×4): qty 1

## 2021-12-06 MED ORDER — PANTOPRAZOLE SODIUM 40 MG IV SOLR
40.0000 mg | Freq: Every day | INTRAVENOUS | Status: DC
  Administered 2021-12-07: 40 mg via INTRAVENOUS
  Filled 2021-12-06: qty 10

## 2021-12-06 MED ORDER — DIPHENHYDRAMINE HCL 50 MG/ML IJ SOLN
25.0000 mg | Freq: Once | INTRAMUSCULAR | Status: DC
  Filled 2021-12-06: qty 1

## 2021-12-06 MED ORDER — IOHEXOL 300 MG/ML  SOLN
100.0000 mL | Freq: Once | INTRAMUSCULAR | Status: AC | PRN
  Administered 2021-12-06: 100 mL via INTRAVENOUS

## 2021-12-06 MED ORDER — RINGERS IV SOLN: INTRAVENOUS | Status: AC

## 2021-12-06 MED ORDER — METRONIDAZOLE 500 MG/100ML IV SOLN
500.0000 mg | Freq: Two times a day (BID) | INTRAVENOUS | Status: DC
  Administered 2021-12-07: 500 mg via INTRAVENOUS
  Filled 2021-12-06: qty 100

## 2021-12-06 MED ORDER — SODIUM CHLORIDE 0.9 % IV SOLN
2.0000 g | Freq: Once | INTRAVENOUS | Status: AC
  Administered 2021-12-06: 2 g via INTRAVENOUS
  Filled 2021-12-06: qty 12.5

## 2021-12-06 MED ORDER — INSULIN ASPART 100 UNIT/ML IJ SOLN
1.0000 [IU] | INTRAMUSCULAR | Status: DC
  Administered 2021-12-07 (×3): 2 [IU] via SUBCUTANEOUS
  Filled 2021-12-06: qty 0.03

## 2021-12-06 MED ORDER — RINGERS IV SOLN: INTRAVENOUS | Status: DC

## 2021-12-06 MED ORDER — DOCUSATE SODIUM 100 MG PO CAPS: 100.0000 mg | ORAL_CAPSULE | Freq: Two times a day (BID) | ORAL | Status: DC | PRN

## 2021-12-06 MED ORDER — NOREPINEPHRINE 4 MG/250ML-% IV SOLN
2.0000 ug/min | INTRAVENOUS | Status: DC
  Administered 2021-12-06: 2 ug/min via INTRAVENOUS
  Filled 2021-12-06: qty 250

## 2021-12-06 MED ORDER — SODIUM CHLORIDE 0.9 % IV SOLN: Freq: Once | INTRAVENOUS | Status: AC

## 2021-12-06 MED ORDER — SODIUM CHLORIDE 0.9 % IV SOLN
2.0000 g | Freq: Two times a day (BID) | INTRAVENOUS | Status: DC
  Administered 2021-12-07: 2 g via INTRAVENOUS
  Filled 2021-12-06: qty 12.5

## 2021-12-06 MED ORDER — METRONIDAZOLE 500 MG/100ML IV SOLN
500.0000 mg | Freq: Once | INTRAVENOUS | Status: AC
  Administered 2021-12-06: 500 mg via INTRAVENOUS
  Filled 2021-12-06: qty 100

## 2021-12-06 MED ORDER — LACTATED RINGERS IV SOLN: INTRAVENOUS | Status: DC

## 2021-12-06 MED ORDER — SODIUM CHLORIDE 0.9 % IV BOLUS
1000.0000 mL | Freq: Once | INTRAVENOUS | Status: AC
  Administered 2021-12-06: 1000 mL via INTRAVENOUS

## 2021-12-06 MED ORDER — SODIUM CHLORIDE 0.9% IV SOLUTION: 250.0000 mL | Freq: Once | INTRAVENOUS | Status: DC

## 2021-12-06 MED ORDER — ACETAMINOPHEN 160 MG/5ML PO SOLN
650.0000 mg | Freq: Once | ORAL | Status: DC
  Filled 2021-12-06: qty 20.3

## 2021-12-06 MED ORDER — SODIUM CHLORIDE 0.9 % IV SOLN: 250.0000 mL | INTRAVENOUS | Status: DC

## 2021-12-06 MED ORDER — VANCOMYCIN HCL IN DEXTROSE 1-5 GM/200ML-% IV SOLN
1000.0000 mg | Freq: Once | INTRAVENOUS | Status: AC
  Administered 2021-12-06: 1000 mg via INTRAVENOUS
  Filled 2021-12-06: qty 200

## 2021-12-06 MED ORDER — FENTANYL CITRATE PF 50 MCG/ML IJ SOSY
PREFILLED_SYRINGE | INTRAMUSCULAR | Status: AC
  Filled 2021-12-06: qty 1

## 2021-12-06 MED ORDER — POLYETHYLENE GLYCOL 3350 17 G PO PACK: 17.0000 g | PACK | Freq: Every day | ORAL | Status: DC | PRN

## 2021-12-06 MED ORDER — SODIUM CHLORIDE 0.9 % IV SOLN
10.0000 mL/h | Freq: Once | INTRAVENOUS | Status: AC
  Administered 2021-12-07: 10 mL/h via INTRAVENOUS

## 2021-12-06 NOTE — ED Triage Notes (Signed)
Pt to er, pt from the cancer center, states that she was seen recently and her lactic acid was 7 and then 3, states that she is here today because her pressure is low and she feels weak.

## 2021-12-06 NOTE — Procedures (Signed)
Central Venous Catheter Insertion Procedure Note  Baleria Wyman  [id number]  Sep 15, 1945  Date:11/20/2021  Time:11:08 PM   Provider Performing:Tali Cleaves   Procedure: Insertion of Non-tunneled Central Venous [phone]) with US guidance (60109)   Indication(s) Medication administration and Difficult access  Consent Risks of the procedure as well as the alternatives and risks of each were explained to the patient and/or caregiver.  Consent for the procedure was obtained and is signed in the bedside chart  Anesthesia Topical only with 1% lidocaine   Timeout Verified patient identification, verified procedure, site/side was marked, verified correct patient position, special equipment/implants available, medications/allergies/relevant history reviewed, required imaging and test results available.  Sterile Technique Maximal sterile technique including full sterile barrier drape, hand hygiene, sterile gown, sterile gloves, mask, hair covering, sterile ultrasound probe cover (if used).  Procedure Description Area of catheter insertion was cleaned with chlorhexidine and draped in sterile fashion.  With real-time ultrasound guidance a central venous catheter was placed into the right femoral vein. Nonpulsatile blood flow and easy flushing noted in all ports.  The catheter was sutured in place and sterile dressing applied.     Complications/Tolerance None; patient tolerated the procedure well. Chest X-ray is ordered to verify placement for internal jugular or subclavian cannulation.   Chest x-ray is not ordered for femoral cannulation.  EBL Minimal  Specimen(s) None   Kipp Brood, MD [facility] ICU Physician [place]  Pager: [phone] Or Epic Secure Chat After hours: [phone].  11/20/2021, 11:08 PM

## 2021-12-06 NOTE — Progress Notes (Signed)
Patient with history of MPN, Merkel cell, remote left breast cancer followed by oncologist Dr. Chryl Heck. She was evaluated and ultimately discharged from the ED yesterday evening. I have viewed that note and work up.  Patient seen by MD today while receiving [facility] for her scheduled oncology appointment. Plan was for patient to receive IVF and platelets. Labs were checked today and CBC shows anemia 6.2 with platelet count of 8. CMP with hypercalcemia and elevated liver enzymes, worsening compared to prior. Patient is hypotensive and tachycardic. She will require hospital admission however is not stable for direct admission from clinic. Patient received 1 L of IVF in clinic and was taken to the ED.

## 2021-12-06 NOTE — ED Notes (Signed)
Second blood culture unobtained, md notified. Plan to start abx prior to second blood culture, pt to place order for iv therapy consult.

## 2021-12-06 NOTE — Telephone Encounter (Signed)
CALLED PATIENT TO INFORM OF CT FOR 12/13/2021- ARRIVAL TIME- 4:45 PM @ [facility], PATIENT TO BE NPO- 4 HRS. PRIOR TO TEST, LVM FOR A RETURN CALL

## 2021-12-06 NOTE — Telephone Encounter (Signed)
This RN contacted [facility] and spoke with [place] with Dr Joan Mayans- report given of continued decline in labs despite transfusion as well as failure to thrive issues due to inability to swallow secondary to radiation to her mouth for Merkle Cell Ca.  Dr Rob Hickman direct phone number given per request for call to discuss pt's status- and further recommendations.  Yolanda verbalized above back to this RN including phone number for Dr Chryl Heck.

## 2021-12-06 NOTE — ED Notes (Addendum)
Per doctor request, RN to give blood first then antibiotics since there's only one IV access at the moment.

## 2021-12-06 NOTE — ED Provider Notes (Cosign Needed Addendum)
[facility] DEPT Provider Note   CSN: [id number] Arrival date & time: 11/21/2021  1533     History  Chief Complaint  Patient presents with   Weakness    Tanya Mitchell is a 76 y.o. female who is presenting from the cancer center with hypotension.  The patient has a history of breast cancer, Merkel cell carcinoma, currently undergoing radiation therapy.  The patient is here with her husband at the bedside.  The patient reports that lately the patient has had decreased oral intake due to radiation therapy, pain with swallowing.  The patient was seen yesterday in the ED due to hypotension.  The patient had lab work drawn yesterday and was shown to have an elevated lactic acid to 7.  The patient was given 2 L of fluid and the patient lactic acid decreased to 3 and she was discharged home.  The patient returned to the cancer center today for blood transfusion, fluid.  The patient was noted to be hypotensive in the cancer center team decided that this patient would most likely need admission.  The patient was too unstable to be admitted directly to the clinic.  Patient has been at bedside states that the patient is also been unable to eat any solids since Saturday.   Weakness      Home Medications Prior to Admission medications   Medication Sig Start Date End Date Taking? Authorizing Provider  aspirin EC 81 MG tablet Take 1 tablet (81 mg total) by mouth daily. Swallow whole. 04/15/21  Yes Freada Bergeron, MD  carvedilol (COREG) 6.25 MG tablet Take 6.25 mg by mouth 2 (two) times daily. 09/28/21  Yes [provider]  cholecalciferol (VITAMIN D) 1000 UNITS tablet Take 2,000 Units by mouth in the morning.   Yes [provider]  cyclobenzaprine (FLEXERIL) 5 MG tablet Take 5-10 mg by mouth at bedtime as needed. 09/23/21  Yes [provider]  dapagliflozin propanediol (FARXIGA) 10 MG TABS tablet Take 1 tablet (10 mg total) by mouth daily  before breakfast. 11/07/21  Yes Pemberton, Greer Ee, MD  fluconazole (DIFLUCAN) 100 MG tablet Take 2 tablets today, then 1 tablet daily x 20 more days. Hold Atorvastatin while on this. 11/28/21  Yes Eppie Gibson, MD  furosemide (LASIX) 40 MG tablet Take 1 tablet (40 mg total) by mouth daily as needed. Patient taking differently: Take 40 mg by mouth daily as needed for fluid or edema. 04/06/21  Yes Jettie Booze, MD  hydroxyurea (HYDREA) 500 MG capsule Take 500 mg by mouth daily. In the evening. May take with food to minimize GI side effects. 08/09/20  Yes Magrinat, Virgie Dad, MD  metoprolol succinate (TOPROL XL) 25 MG 24 hr tablet Take 0.5 tablets (12.5 mg total) by mouth at bedtime. Please hold this med if systolic BP is 90 or less. 07/28/21  Yes Pemberton, Greer Ee, MD  sacubitril-valsartan (ENTRESTO) 24-26 MG Take 1 tablet by mouth 2 (two) times daily. 11/25/21  Yes Freada Bergeron, MD  sodium fluoride (SODIUM FLUORIDE 5000 PLUS) 1.1 % CREA dental cream Place pea-size amount onto toothbrush every evening before bedtime to brush teeth. Do NOT rinse with water, eat or drink for at least 30 minutes after use. 10/31/21  Yes Owsley, Madison B, DMD  spironolactone (ALDACTONE) 25 MG tablet Take 25 mg by mouth daily. 09/22/21  Yes [provider]  allopurinol (ZYLOPRIM) 300 MG tablet Take 1 tablet (300 mg total) by mouth daily. Patient not taking:  Reported on 12/04/2021 11/09/20   Magrinat, Virgie Dad, MD  atorvastatin (LIPITOR) 80 MG tablet Take 1 tablet (80 mg total) by mouth daily. Patient not taking: Reported on 11/16/2021 07/18/21   Freada Bergeron, MD  magic mouthwash (lidocaine, diphenhydrAMINE, alum & mag hydroxide) suspension Swish and swallow 5 mLs 3 (three) times daily as needed for up to 10 days for mouth pain. Do not exceed more than the allotted dose.  Long-term use of this medicine can cause danger of arrhythmias. 12/05/21 12/15/21  Wyvonnia Dusky, MD  Magnesium 400 MG TABS  Take 1 tablet by mouth daily as needed. Only take on days taking furosemide Patient not taking: Reported on 12/13/2021 04/06/21   Jettie Booze, MD  metFORMIN (GLUCOPHAGE) 500 MG tablet Take 0.5 tablets (250 mg total) by mouth in the morning and at bedtime. Patient not taking: Reported on 11/29/2021 04/22/21   Jettie Booze, MD  metFORMIN (GLUCOPHAGE-XR) 500 MG 24 hr tablet Take 500 mg by mouth 2 (two) times daily. 10/24/21   [provider]  potassium chloride SA (KLOR-CON M) 20 MEQ tablet Take 1 tablet (20 mEq total) by mouth daily as needed. Only on days taking furosemide Patient not taking: Reported on 12/04/2021 04/06/21   Jettie Booze, MD      Allergies    Patient has no known allergies.    Review of Systems   Review of Systems  Constitutional:  Positive for activity change and appetite change.  Neurological:  Positive for weakness.  All other systems reviewed and are negative.   Physical Exam Updated Vital Signs BP (!) 94/48   Pulse (!) 117   Temp 98.4 F (36.9 C) (Oral)   Resp 20   Ht '5\' 2"'$  (1.575 m)   Wt 44.5 kg   SpO2 99%   BMI 17.92 kg/m  Physical Exam Vitals and nursing note reviewed.  Constitutional:      General: She is not in acute distress.    Appearance: She is ill-appearing and toxic-appearing. She is not diaphoretic.  HENT:     Head: Normocephalic and atraumatic.     Nose: Nose normal. No congestion.     Mouth/Throat:     Mouth: Mucous membranes are dry.  Eyes:     Extraocular Movements: Extraocular movements intact.     Conjunctiva/sclera: Conjunctivae normal.     Pupils: Pupils are equal, round, and reactive to light.  Cardiovascular:     Rate and Rhythm: Regular rhythm. Tachycardia present.  Pulmonary:     Effort: Pulmonary effort is normal.  Abdominal:     General: Abdomen is flat.     Palpations: Abdomen is soft.     Tenderness: There is no abdominal tenderness.  Skin:    General: Skin is warm and dry.   Neurological:     Mental Status: Mental status is at baseline.     ED Results / Procedures / Treatments   Labs (all labs ordered are listed, but only abnormal results are displayed) Labs Reviewed  LACTIC ACID, PLASMA - Abnormal; Notable for the following components:      Result Value   Lactic Acid, Venous 4.8 (*)    All other components within normal limits  LACTIC ACID, PLASMA - Abnormal; Notable for the following components:   Lactic Acid, Venous 5.4 (*)    All other components within normal limits  COMPREHENSIVE METABOLIC PANEL - Abnormal; Notable for the following components:   CO2 20 (*)    Glucose,  Bld 137 (*)    BUN 51 (*)    Calcium 10.5 (*)    Total Protein 5.4 (*)    Albumin 2.6 (*)    AST 258 (*)    ALT 172 (*)    Alkaline Phosphatase 259 (*)    Total Bilirubin 1.7 (*)    All other components within normal limits  CBC - Abnormal; Notable for the following components:   WBC 114.6 (*)    RBC 1.77 (*)    Hemoglobin 5.8 (*)    HCT 17.7 (*)    RDW 18.7 (*)    Platelets 9 (*)    All other components within normal limits  PROTIME-INR - Abnormal; Notable for the following components:   Prothrombin Time 18.2 (*)    INR 1.5 (*)    All other components within normal limits  RESP PANEL BY RT-PCR (FLU A&B, COVID) ARPGX2  CULTURE, BLOOD (ROUTINE X 2)  CULTURE, BLOOD (ROUTINE X 2)  URINE CULTURE  APTT  URINALYSIS, ROUTINE W REFLEX MICROSCOPIC  BRAIN NATRIURETIC PEPTIDE  LACTIC ACID, PLASMA  PREPARE PLATELET PHERESIS  PREPARE RBC (CROSSMATCH)  TYPE AND SCREEN  PREPARE RBC (CROSSMATCH)  PREPARE PLATELET PHERESIS    EKG None  Radiology CT ABDOMEN W CONTRAST  Result Date: 12/02/2021 CLINICAL DATA:  Invasive breast cancer stage IV. Initial workup. * Tracking Code: BO * EXAM: CT CHEST AND ABDOMEN WITHOUT CONTRAST TECHNIQUE: Multidetector CT imaging of the chest and abdomen was performed following the standard protocol without intravenous contrast. RADIATION DOSE  REDUCTION: This exam was performed according to the departmental dose-optimization program which includes automated exposure control, adjustment of the mA and/or kV according to patient size and/or use of iterative reconstruction technique. COMPARISON:  PET-CT December 02 2014. FINDINGS: CT CHEST FINDINGS WITHOUT CONTRAST Cardiovascular: Aortic atherosclerosis. No central pulmonary embolus on this nondedicated study. Normal size heart. No significant pericardial effusion/thickening. Coronary artery calcifications. Calcifications of the mitral annulus. Mediastinum/Nodes: Ill-defined soft tissue stranding in the thoracic inlet for instance on image 12/3. No suspicious thyroid nodule. No pathologically enlarged mediastinal, hilar or axillary lymph nodes. The esophagus is grossly unremarkable. Lungs/Pleura: Scattered tiny pulmonary nodules do not appear changed from prior PET-CT dated December 02, 2014. For reference: 3 mm left lower lobe pulmonary nodule on image 75/6 is unchanged. 4 mm subpleural pulmonary nodule in the right lower lobe is unchanged. No new suspicious pulmonary nodules or masses. Trace bilateral pleural effusions with mild bilateral septal thickening. Musculoskeletal: Cachexia. Clips in the left breast and axilla. Subcutaneous body wall edema. No aggressive lytic or blastic lesion of bone. CT ABDOMEN FINDINGS WITHOUT CONTRAST Hepatobiliary: Hepatomegaly measuring 24.3 cm in maximum craniocaudal dimension. Innumerable bilobar hypodense hepatic lesions. For reference: -segment VII hepatic lesion measures 2.3 cm on image 11/4. -segment III hepatic lesion measures 2.2 cm on image 29/4. Gallbladder is unremarkable.  No biliary ductal dilation. Pancreas: No pancreatic ductal dilation or evidence of acute inflammation. Spleen: Splenomegaly measuring 13.7 cm in maximum craniocaudal dimension. Lobular hypodense splenic lesions measure up to 4.9 cm on image 24/4. Adrenals/Urinary Tract: Hyperdense/enhancing 6 mm  nodule in the posterior right adrenal gland on image 30/4 appears to have been subtly evident on PET-CT December 02, 2014 measuring 4 mm. No discrete left-sided adrenal nodule. Kidneys demonstrate symmetric enhancement. No solid enhancing renal mass. Stomach/Bowel: Stomach is nondistended limiting evaluation. No pathologic dilation or evidence of acute inflammation involving loops of large or small bowel in the abdomen. Vascular/Lymphatic: Aortic atherosclerosis. No abdominal aortic aneurysm.  Evaluation for abdominal adenopathy is limited by paucity of peritoneal fat and technique. Within this context there is an enlarged gastrohepatic ligament lymph node which measures 15 mm in short axis and scattered prominent retroperitoneal lymph nodes for instance a left periaortic lymph node at the level of the left renal vein measuring 7 mm in short axis on image 38/4. Other: Surgical clips in the anterior abdominal wall commonly reflect sequela of breast flap reconstruction post mastectomy. Anasarca. Cachexia. Musculoskeletal: No aggressive lytic or blastic lesion of bone IMPRESSION: 1. Innumerable bilobar hypodense hepatic lesions, consistent with metastatic disease. 2. Enlarged gastrohepatic ligament lymph node and scattered prominent retroperitoneal lymph nodes, suspicious for metastatic disease. 3. Splenomegaly with multiple hypodense splenic lesions measuring up to 4.9 cm, nonspecific but new from prior and suspicious for metastatic disease. 4. Hyperdense/enhancing 6 mm right adrenal nodule appears to have been subtly evident on PET-CT December 02 2014 measuring 4 mm and favored to reflect a benign adenoma. However, attention on follow-up imaging is suggested. 5. Nonspecific ill-defined soft tissue stranding in the thoracic inlet without discrete supraclavicular or mediastinal adenopathy. Attention on follow-up imaging suggested. 6. Scattered tiny pulmonary nodules are similar to prior PET-CT dated December 02, 2014, and  compatible with a benign etiology, no new suspicious pulmonary nodules identified. 7. Trace bilateral pleural effusions with mild bilateral septal thickening, suggestive of pulmonary edema. 8. Cachexia cachectic habitus. 9. Anasarca, compatible with third-spacing. Electronically Signed   By: Dahlia Bailiff M.D.   On: 11/21/2021 18:56   CT Chest W Contrast  Result Date: 12/09/2021 CLINICAL DATA:  Invasive breast cancer stage IV. Initial workup. * Tracking Code: BO * EXAM: CT CHEST AND ABDOMEN WITHOUT CONTRAST TECHNIQUE: Multidetector CT imaging of the chest and abdomen was performed following the standard protocol without intravenous contrast. RADIATION DOSE REDUCTION: This exam was performed according to the departmental dose-optimization program which includes automated exposure control, adjustment of the mA and/or kV according to patient size and/or use of iterative reconstruction technique. COMPARISON:  PET-CT December 02 2014. FINDINGS: CT CHEST FINDINGS WITHOUT CONTRAST Cardiovascular: Aortic atherosclerosis. No central pulmonary embolus on this nondedicated study. Normal size heart. No significant pericardial effusion/thickening. Coronary artery calcifications. Calcifications of the mitral annulus. Mediastinum/Nodes: Ill-defined soft tissue stranding in the thoracic inlet for instance on image 12/3. No suspicious thyroid nodule. No pathologically enlarged mediastinal, hilar or axillary lymph nodes. The esophagus is grossly unremarkable. Lungs/Pleura: Scattered tiny pulmonary nodules do not appear changed from prior PET-CT dated December 02, 2014. For reference: 3 mm left lower lobe pulmonary nodule on image 75/6 is unchanged. 4 mm subpleural pulmonary nodule in the right lower lobe is unchanged. No new suspicious pulmonary nodules or masses. Trace bilateral pleural effusions with mild bilateral septal thickening. Musculoskeletal: Cachexia. Clips in the left breast and axilla. Subcutaneous body wall edema.  No aggressive lytic or blastic lesion of bone. CT ABDOMEN FINDINGS WITHOUT CONTRAST Hepatobiliary: Hepatomegaly measuring 24.3 cm in maximum craniocaudal dimension. Innumerable bilobar hypodense hepatic lesions. For reference: -segment VII hepatic lesion measures 2.3 cm on image 11/4. -segment III hepatic lesion measures 2.2 cm on image 29/4. Gallbladder is unremarkable.  No biliary ductal dilation. Pancreas: No pancreatic ductal dilation or evidence of acute inflammation. Spleen: Splenomegaly measuring 13.7 cm in maximum craniocaudal dimension. Lobular hypodense splenic lesions measure up to 4.9 cm on image 24/4. Adrenals/Urinary Tract: Hyperdense/enhancing 6 mm nodule in the posterior right adrenal gland on image 30/4 appears to have been subtly evident on PET-CT December 02, 2014 measuring 4  mm. No discrete left-sided adrenal nodule. Kidneys demonstrate symmetric enhancement. No solid enhancing renal mass. Stomach/Bowel: Stomach is nondistended limiting evaluation. No pathologic dilation or evidence of acute inflammation involving loops of large or small bowel in the abdomen. Vascular/Lymphatic: Aortic atherosclerosis. No abdominal aortic aneurysm. Evaluation for abdominal adenopathy is limited by paucity of peritoneal fat and technique. Within this context there is an enlarged gastrohepatic ligament lymph node which measures 15 mm in short axis and scattered prominent retroperitoneal lymph nodes for instance a left periaortic lymph node at the level of the left renal vein measuring 7 mm in short axis on image 38/4. Other: Surgical clips in the anterior abdominal wall commonly reflect sequela of breast flap reconstruction post mastectomy. Anasarca. Cachexia. Musculoskeletal: No aggressive lytic or blastic lesion of bone IMPRESSION: 1. Innumerable bilobar hypodense hepatic lesions, consistent with metastatic disease. 2. Enlarged gastrohepatic ligament lymph node and scattered prominent retroperitoneal lymph nodes,  suspicious for metastatic disease. 3. Splenomegaly with multiple hypodense splenic lesions measuring up to 4.9 cm, nonspecific but new from prior and suspicious for metastatic disease. 4. Hyperdense/enhancing 6 mm right adrenal nodule appears to have been subtly evident on PET-CT December 02 2014 measuring 4 mm and favored to reflect a benign adenoma. However, attention on follow-up imaging is suggested. 5. Nonspecific ill-defined soft tissue stranding in the thoracic inlet without discrete supraclavicular or mediastinal adenopathy. Attention on follow-up imaging suggested. 6. Scattered tiny pulmonary nodules are similar to prior PET-CT dated December 02, 2014, and compatible with a benign etiology, no new suspicious pulmonary nodules identified. 7. Trace bilateral pleural effusions with mild bilateral septal thickening, suggestive of pulmonary edema. 8. Cachexia cachectic habitus. 9. Anasarca, compatible with third-spacing. Electronically Signed   By: Dahlia Bailiff M.D.   On: 11/28/2021 18:56   CT Soft Tissue Neck W Contrast  Result Date: 12/03/2021 CLINICAL DATA:  Occult malignancy. Patient is being treated for Merkel cell cancer. Remote history breast cancer. Hypotension and anemia. EXAM: CT NECK WITH CONTRAST TECHNIQUE: Multidetector CT imaging of the neck was performed using the standard protocol following the bolus administration of intravenous contrast. RADIATION DOSE REDUCTION: This exam was performed according to the departmental dose-optimization program which includes automated exposure control, adjustment of the mA and/or kV according to patient size and/or use of iterative reconstruction technique. CONTRAST:  113m OMNIPAQUE IOHEXOL 300 MG/ML  SOLN COMPARISON:  CT of the neck [facility]2021 FINDINGS: Pharynx and larynx: Diffuse edematous changes are present throughout the neck. No focal mucosal lesions are present. Salivary glands: No focal salivary lesion.  No duct obstruction.  Thyroid: Normal Lymph nodes: No residual discrete adenopathy is present. Previously seen lymph nodes are no longer visible. Vascular: Atherosclerotic calcifications are present at the carotid bifurcations bilaterally. No significant stenosis is present. Limited intracranial: Within normal limits. Visualized orbits: Bilateral lens replacements are noted. Globes and orbits are otherwise unremarkable. Mastoids and visualized paranasal sinuses: The paranasal sinuses and mastoid air cells are clear. Skeleton: No discrete osseous lesions are present. Upper chest: The lung apices are clear. Thoracic inlet is within normal limits. Previously noted mass lesion on the lower lip is no longer visible. IMPRESSION: 1. Diffuse edematous changes throughout the neck compatible with radiation therapy. 2. No residual discrete adenopathy. Previously seen lymph nodes are no longer visible. 3. Atherosclerosis at the carotid bifurcations bilaterally without significant stenosis. Electronically Signed   By: CSan MorelleM.D.   On: 12/09/2021 18:51   DG Chest Port 1 View  Result Date: 12/09/2021  CLINICAL DATA:  Questionable sepsis - evaluate for abnormality EXAM: PORTABLE CHEST 1 VIEW COMPARISON:  Radiograph 12/05/2021 FINDINGS: Unchanged cardiomediastinal silhouette. Low lung volumes. Mild interstitial opacities likely due to vascular crowding. There is potential medial apical airspace disease. No pleural effusion. No evidence of pneumothorax. No acute osseous abnormality. Thoracic spondylosis. Surgical clips overlie the left chest. IMPRESSION: Low lung volumes with vascular crowding and potential airspace opacities in the medial apices. A chest CT has been ordered, per medical record. Electronically Signed   By: Maurine Simmering M.D.   On: 12/13/2021 17:58   DG Chest Port 1 View  Result Date: 12/05/2021 CLINICAL DATA:  Hypotension. Decreased oral intake. Personal history of melanoma and breast cancer. EXAM: PORTABLE CHEST 1  VIEW COMPARISON:  Two-view chest x-ray 10/05/2011 FINDINGS: Heart size is normal. Lung volumes are low. No focal airspace disease is present. No edema or effusion is present. Postoperative changes are present in the left axilla and over the left chest. IMPRESSION: 1. Low lung volumes. 2. No acute cardiopulmonary disease. Electronically Signed   By: San Morelle M.D.   On: 12/05/2021 15:11    Procedures .Critical Care E&M  Performed by: Azucena Cecil, PA-C Critical care provider statement:    Critical care time (minutes):  85   Critical care time was exclusive of:  Separately billable procedures and treating other patients and teaching time   Critical care was necessary to treat or prevent imminent or life-threatening deterioration of the following conditions:  Circulatory failure, dehydration, sepsis and cardiac failure   Critical care was time spent personally by me on the following activities:  Blood draw for specimens, development of treatment plan with patient or surrogate, discussions with consultants, discussions with primary provider, evaluation of patient's response to treatment, examination of patient, interpretation of cardiac output measurements, obtaining history from patient or surrogate, vascular access procedures, review of old charts, re-evaluation of patient's condition, pulse oximetry, ordering and review of radiographic studies, ordering and review of laboratory studies and ordering and performing treatments and interventions   Care discussed with: admitting provider   After initial E/M assessment, critical care services were subsequently performed that were exclusive of separately billable procedures or treatment.      Medications Ordered in ED Medications  lactated ringers infusion (has no administration in time range)  vancomycin (VANCOCIN) IVPB 1000 mg/200 mL premix (has no administration in time range)  0.9 %  sodium chloride infusion (has no administration  in time range)  0.9 %  sodium chloride infusion (has no administration in time range)  0.9 %  sodium chloride infusion (has no administration in time range)  norepinephrine (LEVOPHED) '4mg'$  in 29m (0.016 mg/mL) premix infusion (has no administration in time range)  sodium chloride 0.9 % bolus 1,000 mL (0 mLs Intravenous Stopped 12/01/2021 1727)  iohexol (OMNIPAQUE) 300 MG/ML solution 100 mL (100 mLs Intravenous Contrast Given 11/15/2021 1811)  ceFEPIme (MAXIPIME) 2 g in sodium chloride 0.9 % 100 mL IVPB (2 g Intravenous New Bag/Given 11/17/2021 1742)  metroNIDAZOLE (FLAGYL) IVPB 500 mg (500 mg Intravenous New Bag/Given 11/20/2021 1850)    ED Course/ Medical Decision Making/ A&P Clinical Course as of 11/20/2021 2200  Tue Oct 24, [id number] old female with active chemo and radiation for myelo proliferative disease sent in from oncology clinic for continued weakness.  She was here yesterday Hel elevated lactate and was given fluids with some improvement in her symptoms.  Remains very weak and unable to take p.o. due to  dysphagia.  Hypotensive here although alert.  Getting labs and will likely need transfusion and admission.  Work-up for sepsis. [MB]  2694 Patient's lactate elevated at 4.8.  We will activate code sepsis although this potentially is just volume depletion.  Empiric antibiotics ordered.  She is awaiting a transfusion for her anemia along with platelets for her thrombocytopenia. [MB]    Clinical Course User Index [MB] Hayden Rasmussen, MD                           Medical Decision Making Amount and/or Complexity of Data Reviewed Labs: ordered. Radiology: ordered. ECG/medicine tests: ordered.  Risk Prescription drug management. Decision regarding hospitalization.   76 year old female with extensive medical conditions presents to the ED for evaluation of weakness, send for cancer Center.  Please see HPI for further details.  On my examination the patient is tachycardic to the  120s, hypotensive with a blood pressure of 94/48.  Patient is very thin in appearance, she is cachectic.  The patient is ill-appearing and toxic-appearing.  The patient will open her eyes to voice, she is alert and oriented however very drowsy.  The patient is not hypoxic.  There is no tenderness in her abdomen.  Patient presentation concerning for sepsis versus volume depletion.  Sepsis order set was initiated at this time.  Broad-spectrum antibiotics to include vancomycin, cefepime, metronidazole have been ordered.  The patient lab work will include lactic acid x2, blood culture x2, CBC, CMP, urinalysis, type and screen, PT/INR, BNP, urinalysis.  I called and spoke with cancer center PA who advised that they would like to have this patient's scans repeated to include CT chest with contrast, CT abdomen with contrast, CT soft tissue neck with contrast.  Initially this patient was started on 1 L of fluid.  The patient has a reduced ejection fraction with a EF of 40%.  The patient received 2 L of fluid yesterday in the ED, 1 L of fluid today in the cancer clinic.  The 1 L of fluid I will provide her with will be the 4th L of fluid she has received in the last 24 hours.  Patient lab work resulted showing a leukocytosis to 114.6, hemoglobin of 5.8, platelets of 9.  Broad-spectrum antibiotics already began.  The patient will also receive 1 unit of platelets as well as red blood cells.  We have a challenge establishing IV access on this patient.  The patient only has access in 1 site at this time.  Patient lactic acid 4.8, repeat lactic acid 5.4 despite fluid replenishment.  Patient PT/INR elevated.  Patient prothrombin time 18.2.  Patient INR 1.5.  Patient CMP noted to have elevated LFTs which is new.  Patient BMP, urinalysis pending at this time.  Patient CT abdomen shows new evidence of lesions on spleen consistent with metastatic spread.  CT abdomen also noted to show anasarca consistent with third  spacing.  Patient also noted to have bilateral pleural effusions.  At this time the patient will require admission.  Hospitalist, Dr. Trilby Drummer, has returned my call and stated that due to patient hypotension and tachycardia patient will most likely need a central line and he is unsure if this patient would be more appropriate for critical care service. I have placed consult to CCM at this time.   Intensivist, Dr. Doyne Keel, has returned my call and agreed to admit the patient.  He is requesting that central line IV be placed  and she be started on pressors.  Orders have been initiated at this time, I have advised my attending that a central line will need to be placed.  Upon preparing to place central line, Dr. Doyne Keel has presented to the patient bedside.  Final Clinical Impression(s) / ED Diagnoses Final diagnoses:  Physical deconditioning  Sepsis, due to unspecified organism, unspecified whether acute organ dysfunction present ([place])  Thrombocytopenia ([place])  Leukocytosis, unspecified type    Rx / DC Orders ED Discharge Orders     None          Lawana Chambers 11/29/2021 2212    Hayden Rasmussen, MD 12-11-21 1019

## 2021-12-06 NOTE — Progress Notes (Signed)
A consult was received from an ED physician for Cefepime and Vancomycin per pharmacy dosing.  The patient's profile has been reviewed for ht/wt/allergies/indication/available labs.   A one time order has been placed for Cefepime 2g, Vancomycin 1g.    Further antibiotics/pharmacy consults should be ordered by admitting physician if indicated.                       Thank you,  Gretta Arab PharmD, BCPS [facility] main pharmacy [phone] 11/16/2021 5:20 PM

## 2021-12-06 NOTE — Progress Notes (Signed)
ID: Keith Rake Gangi OB: 1945-11-27  MR#: [id number]  CSN#:[id number]  Patient Care Team: Benay Pike, MD as PCP - General (Hematology and Oncology) Bobbye Charleston, MD as Consulting Physician (Obstetrics and Gynecology) Janyth Contes, MD as Referring Physician (Hematology and Oncology) Francina Ames, MD as Referring Physician (Otolaryngology) Malmfelt, Stephani Police, RN as Oncology Nurse Navigator Eppie Gibson, MD as Attending Physician (Radiation Oncology)   CHIEF COMPLAINTS:   1)  Hx Left Breast Cancer 1995 (s/p left mastectomy)       2) Hx Right Breast Cancer 2007                   3)  Hx Melanoma, Stage IIIA       4) MPN with leukocytosis/thrombocytosis      CURRENT TREATMENT: hydrea; aranesp; allopurinol  INTERVAL HISTORY:  Mahitha returns today for follow-up of her MPN and newly diagnosed Merkel cell carcinoma. She is now undergoing adjuvant radiation for Merkel cell.   She was seen in the symptom management clinic.  According to her husband, she has not been able to eat anything by mouth for the past 2 to 3 days.  She tried tasting some food.  Yesterday when she tried to get up, she could not gather herself and landed on her bottom and went to the ER.  She was hydrated and discharged.   Today, she continues to feel very weak.  She was not conversant for most of the visit.  Husband gives most of the history. She apparently has been spending most of her time sleeping.  She has a telephone visit with Dr. Joan Mayans coming up next week by Dr. Joan Mayans.   COVID 19 VACCINATION STATUS: [place] x2, most recently 04/2019; had COVID May 2022   HISTORY OF PRESENT ILLNESS: From the earlier summary:  Anyla's history of breast cancer dates back to June of 1995, when she had left modified radical mastectomy under [facility] for a pT1c pN1, stage IIA invasive ductal carcinoma, grade 2, estrogen receptor 97% positive, progesterone receptor 45% positive, treated according to CALGB 9394,  sequential arm (doxorubicin x3, fourth dose apparently omitted, followed by high-dose cyclophosphamide x3), followed by tamoxifen for 5 years. There has been no evidence of disease recurrence.  Further, in October of 2007 she underwent right lumpectomy for a ductal carcinoma in situ, high-grade, estrogen and progesterone receptor negative, followed by adjuvant radiation.  In July of 2013 the patient was noted to have an irregular mole in her left upper back. It is not clear to me whether this might be related to her prior left breast irradiation. Shave biopsy of this area 09/15/2011 by Dr. Derrel Nip [phone]) showed a superficial spreading malignant melanoma, with Breslow depth 1.45 mm, Clark's level IV. There was brisk host response and 8 mitoses per high power field were noted. There was focal regression but no definitive ulceration. There was no satellitosis. There was no vascular and urgent. Margins were positive, but cleared by wide excision under Dr Stark Klein 10/10/2011. There was some residual malignant melanoma in situ but margins to 2 cm were obtained, with advancement flap closure of the skin defect. In addition left axillary sentinel lymph node mapping was performed showing metastatic melanoma in the single lymph node removed.  Jackalynn sought a second opinion at [facility] under Dr Joaquim Lai Collichio. There was a full discussion regarding completion nodal resection, use of adjuvant interferon, and BRAF testing with a view to participation in a research protocol then available. The patient considered all  these options and after much discussion the decision was made not to pursue full nodal dissection, partly because the area in question had a ready undergone surgery and radiation. The patient opted against interferon adjuvant therapy because of its very marginal benefit and significant side effects. She declined consideration of a research study and BRAF testing was not performed.  Her subsequent  history is as detailed below.   PAST MEDICAL HISTORY: Past Medical History:  Diagnosis Date   Breast cancer ([place]) 1995 and 2007   Cardiomyopathy secondary to chemotherapy [facility])    has improved    Hyperlipidemia    Hypertension    under control, has been on med. x 3 yrs.   Melanoma in situ of back ([place]) 09/2011   left   Personal history of chemotherapy    Personal history of radiation therapy    Skin cancer 2013   Melanoma    PAST SURGICAL HISTORY: Past Surgical History:  Procedure Laterality Date   BREAST LUMPECTOMY Right 12/21/2005   right   MASTECTOMY Left 1995   with breast reconstruction - left   RIGHT/LEFT HEART CATH AND CORONARY ANGIOGRAPHY N/A 04/20/2021   Procedure: RIGHT/LEFT HEART CATH AND CORONARY ANGIOGRAPHY;  Surgeon: Jettie Booze, MD;  Location: [place] [facility];  Service: Cardiovascular;  Laterality: N/A;    FAMILY HISTORY Family History  Problem Relation Age of Onset   Lung cancer Father    Cancer Father        lung   Autism Son    Breast cancer Daughter    the patient's father died from complications of lung cancer at the age of 30. He was a heavy smoker. The patient's mother died at the age of 63. Anneli had no siblings. She underwent genetic testing for breast and ovarian cancer panel April of 2014. There were no demonstrable mutations in the BRCA or the other genes in the panel   GYNECOLOGIC HISTORY:  Menarche age 69, first live birth age 89. She is GX P2. She entered menopause at age 45, when she received her chemotherapy. She did not use hormone replacement. She did use birth control remotely, for approximately 14 years, with no complications.   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  They recently me moved to a new home in  the [place] [facility] area.  Shavonte's son [name] is handicapped, and works as an Training and development officer. [name] is Brewing technologist limited. Hortencia has 3 grandchildren and [place] 2. Kimya grew up in an Database administrator denomination   ADVANCED DIRECTIVES: In place   HEALTH MAINTENANCE:  Social History   Tobacco Use   Smoking status: Former    Types: Cigarettes    Quit date: 02/14/1964    Years since quitting: 57.8   Smokeless tobacco: Never  Vaping Use   Vaping Use: Never used  Substance Use Topics   Alcohol use: Yes    Comment: daily glass of wine   Drug use: No     Colonoscopy: 2011  PAP: November 2014  Bone density: January 2013, osteopenia  Lipid panel:   August 2013/Dr. Drema Dallas   No Known Allergies  Current Outpatient Medications  Medication Sig Dispense Refill   allopurinol (ZYLOPRIM) 300 MG tablet Take 1 tablet (300 mg  total) by mouth daily. 60 tablet 6   aspirin EC 81 MG tablet Take 1 tablet (81 mg total) by mouth daily. Swallow whole. 90 tablet 3   atorvastatin (LIPITOR) 80 MG tablet Take 1 tablet (80 mg total) by mouth daily. 90 tablet 3   cholecalciferol (VITAMIN D) 1000 UNITS tablet Take 2,000 Units by mouth in the morning.     dapagliflozin propanediol (FARXIGA) 10 MG TABS tablet Take 1 tablet (10 mg total) by mouth daily before breakfast. 90 tablet 3   fluconazole (DIFLUCAN) 100 MG tablet Take 2 tablets today, then 1 tablet daily x 20 more days. Hold Atorvastatin while on this. 22 tablet 0   furosemide (LASIX) 40 MG tablet Take 1 tablet (40 mg total) by mouth daily as needed. 90 tablet 1   hydroxyurea (HYDREA) 500 MG capsule Take 500 mg by mouth daily. In the evening. May take with food to minimize GI side effects.     magic mouthwash (lidocaine, diphenhydrAMINE, alum & mag hydroxide) suspension Swish and swallow 5 mLs 3 (three) times daily as needed for up to 10 days for mouth pain. Do not exceed more than the allotted dose.  Long-term  use of this medicine can cause danger of arrhythmias. 360 mL 0   Magnesium 400 MG TABS Take 1 tablet by mouth daily as needed. Only take on days taking furosemide 90 tablet 1   metFORMIN (GLUCOPHAGE) 500 MG tablet Take 0.5 tablets (250 mg total) by mouth in the morning and at bedtime.     metoprolol succinate (TOPROL XL) 25 MG 24 hr tablet Take 0.5 tablets (12.5 mg total) by mouth at bedtime. Please hold this med if systolic BP is 90 or less. 45 tablet 2   potassium chloride SA (KLOR-CON M) 20 MEQ tablet Take 1 tablet (20 mEq total) by mouth daily as needed. Only on days taking furosemide 90 tablet 1   sacubitril-valsartan (ENTRESTO) 24-26 MG Take 1 tablet by mouth 2 (two) times daily. 180 tablet 3   sodium fluoride (SODIUM FLUORIDE 5000 PLUS) 1.1 % CREA dental cream Place pea-size amount onto toothbrush every evening before bedtime to brush teeth. Do NOT rinse with water, eat or drink for at least 30 minutes after use. 51 g 4   triamcinolone (KENALOG) 0.025 % ointment Apply topically as needed.     No current facility-administered medications for this visit.   Facility-Administered Medications Ordered in Other Visits  Medication Dose Route Frequency Provider Last Rate Last Admin   0.9 %  sodium chloride infusion (Manually program via Guardrails IV Fluids)  250 mL Intravenous Once Sherman Donaldson, Arletha Pili, MD       acetaminophen (TYLENOL) 160 MG/5ML solution 650 mg  650 mg Oral Once Emerie Vanderkolk, Arletha Pili, MD       diphenhydrAMINE (BENADRYL) injection 25 mg  25 mg Intravenous Once Benay Pike, MD        OBJECTIVE: White woman who appears younger than stated age  There were no vitals filed for this visit.      There is no height or weight on file to calculate BMI.    ECOG FS:1 - Symptomatic but completely ambulatory There were no vitals filed for this visit.   Physical Exam Constitutional:      General: She is not in acute distress.    Comments: She appears pale today  HENT:     Head:      Comments: Severe excoriation of the lower lip. Musculoskeletal:  General: Swelling (Right arm swollen and bruised from recent hospitalization) present.  Skin:    General: Skin is warm and dry.  Neurological:     General: No focal deficit present.     Mental Status: She is alert.    LAB RESULTS:   Lab Results  Component Value Date   WBC 117.4 (HH) 11/25/2021   NEUTROABS 91.2 (H) 12/02/2021   HGB 6.2 (LL) 11/28/2021   HCT 18.0 (L) 12/04/2021   MCV 96.3 12/08/2021   PLT 8 (LL) 11/28/2021      Chemistry      Component Value Date/Time   NA 141 12/05/2021 1253   NA 138 04/25/2021 1018   NA 139 11/30/2015 1225   K 4.5 11/17/2021 1253   K 4.6 11/30/2015 1225   CL 109 11/23/2021 1253   CL 104 01/18/2012 0948   CO2 24 11/23/2021 1253   CO2 27 11/30/2015 1225   BUN 49 (H) 11/13/2021 1253   BUN 22 04/25/2021 1018   BUN 13.1 11/30/2015 1225   CREATININE 0.65 12/08/2021 1253   CREATININE 0.7 11/30/2015 1225      Component Value Date/Time   CALCIUM 11.2 (H) 11/22/2021 1253   CALCIUM 10.0 11/30/2015 1225   ALKPHOS 278 (H) 12/11/2021 1253   ALKPHOS 83 11/30/2015 1225   AST 240 (HH) 12/02/2021 1253   AST 25 11/30/2015 1225   ALT 161 (H) 11/20/2021 1253   ALT 27 11/30/2015 1225   BILITOT 1.7 (H) 11/15/2021 1253   BILITOT 0.60 11/30/2015 1225      STUDIES: DG Chest Port 1 View  Result Date: 12/05/2021 CLINICAL DATA:  Hypotension. Decreased oral intake. Personal history of melanoma and breast cancer. EXAM: PORTABLE CHEST 1 VIEW COMPARISON:  Two-view chest x-ray 10/05/2011 FINDINGS: Heart size is normal. Lung volumes are low. No focal airspace disease is present. No edema or effusion is present. Postoperative changes are present in the left axilla and over the left chest. IMPRESSION: 1. Low lung volumes. 2. No acute cardiopulmonary disease. Electronically Signed   By: San Morelle M.D.   On: 12/05/2021 15:11   MR Brain W Wo Contrast  Result Date:  11/15/2021 CLINICAL DATA:  Marked cell carcinoma with new symptoms of double vision. Concern for metastatic disease. EXAM: MRI HEAD WITHOUT AND WITH CONTRAST TECHNIQUE: Multiplanar, multiecho pulse sequences of the brain and surrounding structures were obtained without and with intravenous contrast. CONTRAST:  5 cc Vueway COMPARISON:  None Available. FINDINGS: Brain: There is a small focus of DWI signal abnormality with associated T2/FLAIR hyperintensity and heterogeneous SWI signal dropout in the right postcentral gyrus. There is intrinsic T1 hyperintensity with no definite superimposed surrounding enhancement. There is no surrounding edema or mass effect. This finding may reflect a cavernoma; however, in the absence of prior imaging, a hemorrhagic metastatic lesion can not be entirely excluded. There is no other diffusion signal abnormality. There is no other evidence of acute intracranial hemorrhage or extra-axial fluid collection. Background parenchymal volume is normal. The ventricles are normal in size. Gray-white differentiation is preserved. Patchy small foci of FLAIR signal abnormality in the supratentorial white matter are nonspecific but likely reflects sequela of chronic small vessel ischemic change. The pituitary and suprasellar region are normal. There is no abnormality of the optic chiasm. There is no abnormal enhancement. There is no other evidence of intracranial metastatic disease. There is no mass effect or midline shift. Vascular: Normal flow voids. Skull and upper cervical spine: There is diffuse T1 hypointensity throughout the  imaged bone marrow. Sinuses/Orbits: The paranasal sinuses are clear. Bilateral lens implants are in place. The globes and orbits are otherwise unremarkable. The optic nerves are unremarkable on these nondedicated sequences. Other: None. IMPRESSION: 1. Small focus of T2/FLAIR signal abnormality with T1 hyperintensity and SWI signal dropout in the right postcentral gyrus  without mass effect or surrounding parenchymal edema may reflect a small cavernoma with possible small volume acute blood; however, in the absence of prior studies for comparison, a small hemorrhagic metastatic lesion can not be entirely excluded. Recommend follow-up in 2-3 months to assess for stability. 2. No other suspicious parenchymal signal abnormality to suggest intracranial metastatic disease. 3. Diffusely abnormal T1 marrow signal is nonspecific but is likely related to the patient's history of hematologic abnormality. Diffuse osseous metastatic disease could have a similar appearance. Correlate with prior bone marrow biopsy results. Electronically Signed   By: Valetta Mole M.D.   On: 11/15/2021 13:37     ASSESSMENT: 76 y.o. BRCA negative [place] woman   (1) status post left mastectomy with TRAM reconstruction June of 1995 for a T1c N1, stage IIA invasive ductal carcinoma, grade 2, estrogen receptor 97% positive, progesterone receptor 45% positive, treated adjuvantly according to CALGB 9394 with 3 cycles of doxorubicin followed by 3 cycles of cyclophosphamide, then tamoxifen for 5 years, off therapy completed November of 2000  (2) status post right lumpectomy October 2007 for high-grade ductal carcinoma in situ, with negative margins estrogen and progesterone receptor negative, followed by adjuvant radiation therapy   (3) malignant melanoma, T2a N1a = stage IIIA, as follows:  (a) status post shave biopsy from the right upper back 09/15/2011 for a superficial spreading melanoma, Breslow depth 1.45 mm, Clark's level IV, with focal regression but no ulceration.  (b) status post wide excision and sentinel lymph node sampling 10/10/2011 with residual malignant melanoma in situ, but negative margins; the single sentinel lymph node was involved by melanoma with the largest subcapsular deposit measuring 0.22 mm, no evidence of capsular involvement or extracapsular extension  (c) completion nodal  dissection was discussed, but not performed in part because the patient had already had a complete left axillary lymph node dissection at the time of her 1995 left mastectomy (10 lymph nodes were removed at that time)  (d) adjuvant interferon was discussed with the patient when she visited [facility] in September 2013, but given that it side effects and marginal benefits the patient declined  (e) BRAF testing has not been done on the original tumor; this was also discussed with the patient at the time of her [facility] visit, as it might possibly lead to participation in a research protocol; the patient decided not to pursue that option  (4) PET scan 11/19/18/2015 shows nonspecific uptake only at T6.   (a) MRI of the thoracic spine November 2015 showed no evidence of metastatic disease  (b) repeat PET scan 12/02/2014 shows no residual or recurrent hypermetabolic tumor.  (5) thrombocytosis first noted October 2017, leukocytosis first noted November 2019  (a) bone marrow biopsy 04/03/2018 showed a hypercellular bone marrow with granulocytic and megakaryocytic proliferation, some of the megakaryocytes being small and/or hypolobulated.  There was no increase in blasts, no significant increase in reticulin fibers  (b) cytogenetics from bone marrow biopsy 04/03/2018 showed 46,XX[20].nuc ish (ABL1, BCR)x2  (c) molecular studies 12/18/2017 showed no mutations in JAK2 exons 12 and 14 (including V 617);  MPL exon 10 or CALR exon 9  (d) BCR/ABL 1 drawn 12/18/2017 showed 94% normal nuclei (6%  with single fusion)  (e) repeat BCR/ABL 1 on 04/30/2018 showed 100% normal nuclei  (f) repeat BCR/ABL 1 on 02/27/2019: Translocation not detected  (6) bone marrow biopsy on 10/31/2019 shows a hypercellular marrow with myeloid and megakaryocytic hyperplasia but no increase in blasts.  (a) cytogenetics showed no metaphase cells for analysis  (b) Next generation sequencing for myeloid disorders reveals mutations of  ASXL1, GATA2, and  U2AF1. These findings support the impression of a  myeloid neoplasm.    (c) hydroxyurea 500 mg daily started March 2022 with Aranesp support every 4 weeks  (d) hydroxyurea held on 10/25/2020 secondary to progressive anemia, requiring transfusion; EPO increased to every 2 weeks  (e) hydroxyurea currently at 500 mg  daily  PLAN:  She is currently taking Hydrea and darbepoetin as instructed by Dr. Joan Mayans. She is here for follow up. She was seen in [facility] given decrease oral intake and possible hydration. She doesn't look well today. Unfortunately hydration will only improve fluid status but wont provide calories or nourishment.  Her BP continued to be poor. CBC showed Hb of 6.2 and platelets of 8K. I dont think its safe for her to go home with how she feels, hence we advised ED visit. She is agreeable.  I had a detailed discussion with Dr Joan Mayans, she is unfortunately not a candidate for HMA at this time. She may have to continue blood transfusion and platelet transfusion twice a week, hemoglobin goal is 8 and platelet goal is 20,000 based on recommendations from [facility] hematology team. They agree that her prognosis from the MPN is not good but at the same time not entirely sure if she is tanking her blood counts because of recent radiation versus worsening of her myeloproliferative disorder Although she may not have widely spread Merkel cell, unfortunately even from the Merkel cell standpoint, she does not have great prognosis.  So like we discussed with Dr. Joan Mayans, it may be reasonable to support her with twice a week transfusions in the short run, may be in the next 2 to 4 weeks.  If she ends up needing blood and platelets twice a week beyond that time, I think it is reasonable to engage palliative care to discuss goals of care.  I still think is a good idea to engage inpatient palliative care to discuss long-term goals of care.  While she is inpatient, please consider CBC daily Consider one-time  hemolysis labs at admission.  Total time spent: 40 minutes  Benay Pike, MD   11/25/2021 2:28 PM  Oncology and Hematology [facility] [place], [place] [address] Tel. [phone]  Joylene Igo [phone]   *Total Encounter Time as defined by the Centers for Medicare and Medicaid Services includes, in addition to the face-to-face time of a patient visit (documented in the note above) non-face-to-face time: obtaining and reviewing outside history, ordering and reviewing medications, tests or procedures, care coordination (communications with other health care professionals or caregivers) and documentation in the medical record.

## 2021-12-06 NOTE — Progress Notes (Signed)
Pt assessed by IV team. No suitable veins for further IV access. Pt will require central access if more access is needed.

## 2021-12-06 NOTE — Telephone Encounter (Signed)
This RN called patient to schedule [facility] appointment for IV fluids. Patient's husband answered the phone and verified that they can be at [facility] for fluids at 12 pm today.

## 2021-12-06 NOTE — Progress Notes (Signed)
Order from PO '25mg'$  benadryl transcribed to IV and 650 tylenol tablet transcribed to liquid suspension due to patient having trouble swallowing related to oral discomfort.

## 2021-12-06 NOTE — ED Notes (Signed)
Attempted at blood culture number two, attempt unsuccessful, md notified.

## 2021-12-06 NOTE — Progress Notes (Signed)
Elink following code sepsis °

## 2021-12-06 NOTE — Progress Notes (Signed)
Notified bedside nurse of need to draw repeat lactic acid. 

## 2021-12-06 NOTE — H&P (Signed)
NAME:  Tanya Mitchell, MRN:  [id number], DOB:  1945-12-30, LOS: 0 ADMISSION DATE:  12/09/2021, CONSULTATION DATE: 11/17/2021 REFERRING MD:  Melina Copa ED Elvina Sidle, CHIEF COMPLAINT: Shock  History of Present Illness:  76 year old woman who presents with hypotension refractory to fluid bolus and an elevated lactate.  She has a complicated cancer history.  She has a remote history of breast cancer starting in the left breast in 1995 treated with left mastectomy and adjuvant chemotherapy followed by tamoxifen for 5 years.  She had no evidence of recurrence.  In 2007 she underwent a right lumpectomy followed by adjuvant radiation.  In July 2013 a malignant melanoma removed from her back.  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  She is also been found to have a myeloproliferative disorder for which she is being seen at [facility].  Since receiving her radiation therapy she is unable to eat due to pain swallowing she has not had much oral intake for the last week or so.  She has been seen on 2 occasions in oncology clinic with hypotension which resolved partially with IV fluids.  She presents again today with the same.  She has received approximately 1-1/2 L of fluid and remains hypotensive.  Her lactate is elevated at 5.7.  She has marked leukocytosis.  She is anemic with thrombocytopenia.  Pertinent  Medical History   Past Medical History:  Diagnosis Date   Breast cancer ([place]) 1995 and 2007   Cardiomyopathy secondary to chemotherapy [facility])    has improved    Hyperlipidemia    Hypertension    under control, has been on med. x 3 yrs.   Melanoma in situ of back [facility]) 09/2011   left   Personal history of chemotherapy    Personal history of radiation therapy    Skin cancer 2013   [facility] Events: Including procedures, antibiotic start and stop dates in addition to other pertinent events   10/24  -admitted to ICU.  Interim History / Subjective:  She complains of generalized weakness and feeling somnolent.  She is unable to lie flat on her back after falling down at home.  Objective   Blood pressure (!) 94/48, pulse (!) 117, temperature 98.4 F (36.9 C), temperature source Oral, resp. rate 20, height '5\' 2"'$  (1.575 m), weight 44.5 kg, SpO2 99 %.        Intake/Output Summary (Last 24 hours) at 12/08/2021 2236 Last data filed at 12/10/2021 2042 Gross per 24 hour  Intake 1315 ml  Output --  Net 1315 ml   Filed Weights   11/18/2021 1546  Weight: 44.5 kg    Examination: General: Asthenic cachectic woman. HENT: Scleral pallor.  Limited mouth opening.  No evidence of oral leukocytosis.  Marked halitosis. Lungs: Chest is clear to auscultation bilaterally. Cardiovascular: JVP is flat.  There is no peripheral edema.  There is a 2/6 holosystolic murmur.  Extremities are warm. Abdomen: Abdomen is matted but nontender. Extremities: No joint deformities. Neuro: Awake following commands listless.  No focal deficits. GU: Poor urine output.  Ancillary tests personally reviewed  Marked leukocytosis at 114 with abundant immature granulocytes including promyelocytes and metamyelocytes. Thrombocytopenia at 9 Anemia 5.8 Elevated LFTs consistent with infiltrative pattern. Mild hypercalcemia. Creatinine still normal 0.47.  CT abdomen pelvis shows multiple hepatic lesions consistent with metastatic disease there are enlarged lymph nodes and lesions in the spleen as well  consistent with metastatic disease. Chest x-ray is clear Assessment & Plan:   Critically ill due to hypovolemic and possibly distributive shock due to either sepsis of unclear source or progressive overwhelming metastatic disease versus malignant transformation of myelodysplastic syndrome. Poor oral intake with subsequent dehydration following radiation therapy -IV fluid resuscitation with IV maintenance fluids -Transfuse 3  units PRBC -Transfused 1 unit of platelets -Follow-up CBC -Continue empiric vancomycin and cefepime pending culture results. -Titrate norepinephrine to keep MAP greater than 65  Severe protein calorie malnutrition due to poor oral intake following radiation therapy for Merkel cell tumor. -Consider small bore feeding tube  Myelodysplastic syndrome with possible transformation.  Likely secondary to chemotherapy for prior breast cancer. -Oncology consultation in a.m. -Based on recent oncology notes there may not be further therapeutic options.  Will consult palliative care as well.  Best Practice (right click and "Reselect all SmartList Selections" daily)   Diet/type: NPO DVT prophylaxis: SCD GI prophylaxis: PPI Lines: Central line Foley:  Yes, and it is still needed Code Status:  DNR Last date of multidisciplinary goals of care discussion [husband and daughter updated.  They are well-informed regarding her medical condition.  I have indicated to them that there may ultimately not be much that we can do for her underlying malignancy.  Ultimately the plan for this evening would be to temporize with IV fluid, transfusion and antibiotics and to allow for a more complete discussion regarding treatment options with oncology in the morning.  I have indicated to them however that if she were to further decline this would be an indicator that her chance of recovering would be dismal.  They confirmed that she does have a DNR and have agreed to no CPR or intubation given her underlying condition.]  CRITICAL CARE Performed by: Kipp Brood   Total critical care time: 50 minutes  Critical care time was exclusive of separately billable procedures and treating other patients.  Critical care was necessary to treat or prevent imminent or life-threatening deterioration.  Critical care was time spent personally by me on the following activities: development of treatment plan with patient and/or  surrogate as well as nursing, discussions with consultants, evaluation of patient's response to treatment, examination of patient, obtaining history from patient or surrogate, ordering and performing treatments and interventions, ordering and review of laboratory studies, ordering and review of radiographic studies, pulse oximetry, re-evaluation of patient's condition and participation in multidisciplinary rounds.  Kipp Brood, MD [facility] ICU Physician [place]  Pager: [phone] Mobile: [phone] After hours: [phone].

## 2021-12-06 NOTE — Progress Notes (Signed)
CRITICAL VALUE STICKER  CRITICAL VALUE: AST 240  RECEIVER (on-site recipient of call): Thyra Breed  DATE & TIME NOTIFIED: 11/17/2021. 1427  MESSENGER (representative from lab): Dorian Furnace  MD NOTIFIED: Sherol Dade, PA made aware- Dr. Chryl Heck notified  TIME OF NOTIFICATION: 1427  RESPONSE: Patient to be hospitalized

## 2021-12-06 NOTE — Progress Notes (Signed)
500 cc bolus given @ cancer center

## 2021-12-06 NOTE — ED Notes (Signed)
Blood bank has blood and platelets ready for this patient.  Notified [employer]

## 2021-12-07 ENCOUNTER — Ambulatory Visit: Payer: Medicare Other

## 2021-12-07 ENCOUNTER — Ambulatory Visit
Admission: RE | Admit: 2021-12-07 | Discharge: 2021-12-07 | Disposition: A | Discharge status: Home / Self Care | Payer: Medicare Other | Source: Ambulatory Visit | Attending: Radiation Oncology | Admitting: Radiation Oncology

## 2021-12-07 ENCOUNTER — Encounter: Payer: Self-pay | Admitting: Radiation Oncology

## 2021-12-07 DIAGNOSIS — R6521 Severe sepsis with septic shock: Secondary | ICD-10-CM | POA: Diagnosis not present

## 2021-12-07 DIAGNOSIS — D471 Chronic myeloproliferative disease: Secondary | ICD-10-CM

## 2021-12-07 DIAGNOSIS — D696 Thrombocytopenia, unspecified: Secondary | ICD-10-CM

## 2021-12-07 DIAGNOSIS — A419 Sepsis, unspecified organism: Principal | ICD-10-CM

## 2021-12-07 DIAGNOSIS — D62 Acute posthemorrhagic anemia: Secondary | ICD-10-CM

## 2021-12-07 DIAGNOSIS — R5381 Other malaise: Secondary | ICD-10-CM

## 2021-12-07 DIAGNOSIS — Z7189 Other specified counseling: Secondary | ICD-10-CM

## 2021-12-07 DIAGNOSIS — C4A9 Merkel cell carcinoma, unspecified: Secondary | ICD-10-CM

## 2021-12-07 DIAGNOSIS — D72829 Elevated white blood cell count, unspecified: Secondary | ICD-10-CM

## 2021-12-07 DIAGNOSIS — Z515 Encounter for palliative care: Secondary | ICD-10-CM

## 2021-12-07 DIAGNOSIS — Z79899 Other long term (current) drug therapy: Secondary | ICD-10-CM

## 2021-12-07 DIAGNOSIS — R571 Hypovolemic shock: Secondary | ICD-10-CM

## 2021-12-07 LAB — URINALYSIS, ROUTINE W REFLEX MICROSCOPIC
Bacteria, UA: NONE SEEN
Bilirubin Urine: NEGATIVE
Glucose, UA: 150 mg/dL — AB
Hgb urine dipstick: NEGATIVE
Ketones, ur: NEGATIVE mg/dL
Nitrite: NEGATIVE
Protein, ur: 30 mg/dL — AB
Specific Gravity, Urine: 1.043 — ABNORMAL HIGH (ref 1.005–1.030)
pH: 5 (ref 5.0–8.0)

## 2021-12-07 LAB — GLUCOSE, CAPILLARY
Glucose-Capillary: 157 mg/dL — ABNORMAL HIGH (ref 70–99)
Glucose-Capillary: 168 mg/dL — ABNORMAL HIGH (ref 70–99)
Glucose-Capillary: 157 mg/dL — ABNORMAL HIGH (ref 70–99)
Glucose-Capillary: 162 mg/dL — ABNORMAL HIGH (ref 70–99)

## 2021-12-07 LAB — BPAM PLATELET PHERESIS
Blood Product Expiration Date: 202310272359
ISSUE DATE / TIME: 202310242310
Unit Type and Rh: 6200

## 2021-12-07 LAB — CBC
HCT: 24.3 % — ABNORMAL LOW (ref 36.0–46.0)
Hemoglobin: 8.3 g/dL — ABNORMAL LOW (ref 12.0–15.0)
MCH: 29.9 pg (ref 26.0–34.0)
MCHC: 34.2 g/dL (ref 30.0–36.0)
MCV: 87.4 fL (ref 80.0–100.0)
Platelets: 13 10*3/uL — CL (ref 150–400)
RBC: 2.78 MIL/uL — ABNORMAL LOW (ref 3.87–5.11)
RDW: 21 % — ABNORMAL HIGH (ref 11.5–15.5)
WBC: 140.7 10*3/uL (ref 4.0–10.5)
nRBC: 0 % (ref 0.0–0.2)

## 2021-12-07 LAB — BASIC METABOLIC PANEL
Anion gap: 7 (ref 5–15)
BUN: 47 mg/dL — ABNORMAL HIGH (ref 8–23)
CO2: 22 mmol/L (ref 22–32)
Calcium: 10.6 mg/dL — ABNORMAL HIGH (ref 8.9–10.3)
Chloride: 112 mmol/L — ABNORMAL HIGH (ref 98–111)
Creatinine, Ser: 0.56 mg/dL (ref 0.44–1.00)
GFR, Estimated: >60 mL/min (ref >60)
Glucose, Bld: 161 mg/dL — ABNORMAL HIGH (ref 70–99)
Potassium: 3.8 mmol/L (ref 3.5–5.1)
Sodium: 141 mmol/L (ref 135–145)

## 2021-12-07 LAB — PREPARE PLATELET PHERESIS: Unit division: 0

## 2021-12-07 LAB — LACTIC ACID, PLASMA
Lactic Acid, Venous: 2.3 mmol/L (ref 0.5–1.9)
Lactic Acid, Venous: 1.8 mmol/L (ref 0.5–1.9)
Lactic Acid, Venous: 1.9 mmol/L (ref 0.5–1.9)

## 2021-12-07 LAB — MAGNESIUM: Magnesium: 1.8 mg/dL (ref 1.7–2.4)

## 2021-12-07 LAB — PREPARE RBC (CROSSMATCH)

## 2021-12-07 LAB — MRSA NEXT GEN BY PCR, NASAL: MRSA by PCR Next Gen: NOT DETECTED

## 2021-12-07 LAB — PHOSPHORUS: Phosphorus: 2.4 mg/dL — ABNORMAL LOW (ref 2.5–4.6)

## 2021-12-07 MED ORDER — BISACODYL 10 MG RE SUPP: 10.0000 mg | Freq: Every day | RECTAL | Status: DC | PRN

## 2021-12-07 MED ORDER — SODIUM CHLORIDE 0.9% IV SOLUTION: Freq: Once | INTRAVENOUS | Status: DC

## 2021-12-07 MED ORDER — NOREPINEPHRINE 4 MG/250ML-% IV SOLN
0.0000 ug/min | INTRAVENOUS | Status: DC
  Administered 2021-12-07 (×2): 5 ug/min via INTRAVENOUS
  Filled 2021-12-07: qty 250

## 2021-12-07 MED ORDER — HYDROMORPHONE HCL 1 MG/ML IJ SOLN
0.2000 mg | INTRAMUSCULAR | Status: DC | PRN
  Administered 2021-12-07: 0.4 mg via INTRAVENOUS
  Filled 2021-12-07: qty 1

## 2021-12-07 MED ORDER — LORAZEPAM 2 MG/ML IJ SOLN: 0.5000 mg | INTRAMUSCULAR | Status: DC | PRN

## 2021-12-07 MED ORDER — LACTATED RINGERS IV BOLUS: 500.0000 mL | Freq: Once | INTRAVENOUS | Status: DC

## 2021-12-07 MED ORDER — VANCOMYCIN HCL 500 MG/100ML IV SOLN: 500.0000 mg | INTRAVENOUS | Status: DC

## 2021-12-07 MED ORDER — SODIUM CHLORIDE 0.9 % IV SOLN
0.2000 mg/h | INTRAVENOUS | Status: DC
  Administered 2021-12-07: 0.2 mg/h via INTRAVENOUS
  Filled 2021-12-07: qty 5

## 2021-12-07 MED ORDER — SODIUM CHLORIDE 0.9% IV SOLUTION: Freq: Once | INTRAVENOUS | Status: AC

## 2021-12-07 MED ORDER — HYDROMORPHONE BOLUS VIA INFUSION: 0.5000 mg | INTRAVENOUS | Status: DC | PRN

## 2021-12-07 MED ORDER — POLYVINYL ALCOHOL 1.4 % OP SOLN: 1.0000 [drp] | Freq: Four times a day (QID) | OPHTHALMIC | Status: DC | PRN

## 2021-12-07 MED ORDER — CHLORHEXIDINE GLUCONATE CLOTH 2 % EX PADS
6.0000 | MEDICATED_PAD | Freq: Every day | CUTANEOUS | Status: DC
  Administered 2021-12-07: 6 via TOPICAL

## 2021-12-07 MED ORDER — HYDROMORPHONE BOLUS VIA INFUSION
1.0000 mg | INTRAVENOUS | Status: DC | PRN
  Administered 2021-12-07: 1 mg via INTRAVENOUS

## 2021-12-07 MED ORDER — GLYCOPYRROLATE 0.2 MG/ML IJ SOLN: 0.2000 mg | INTRAMUSCULAR | Status: DC | PRN

## 2021-12-07 MED ORDER — LACTATED RINGERS IV BOLUS
500.0000 mL | Freq: Once | INTRAVENOUS | Status: AC
  Administered 2021-12-07: 500 mL via INTRAVENOUS

## 2021-12-08 ENCOUNTER — Ambulatory Visit: Payer: Medicare Other

## 2021-12-08 LAB — URINE CULTURE: Culture: NO GROWTH

## 2021-12-08 LAB — BPAM PLATELET PHERESIS
Blood Product Expiration Date: 202310272359
ISSUE DATE / TIME: 202310250953
Unit Type and Rh: 8400

## 2021-12-08 LAB — PREPARE PLATELET PHERESIS: Unit division: 0

## 2021-12-09 ENCOUNTER — Ambulatory Visit: Payer: Medicare Other

## 2021-12-10 LAB — BPAM RBC
Blood Product Expiration Date: 202311172359
Blood Product Expiration Date: 202311172359
Blood Product Expiration Date: 202311212359
ISSUE DATE / TIME: 202310241934
ISSUE DATE / TIME: 202310250238
Unit Type and Rh: 600
Unit Type and Rh: 600
Unit Type and Rh: 600

## 2021-12-10 LAB — TYPE AND SCREEN
ABO/RH(D): AB NEG
Antibody Screen: NEGATIVE
Unit division: 0
Unit division: 0
Unit division: 0

## 2021-12-10 LAB — CULTURE, BLOOD (ROUTINE X 2)
Culture: NO GROWTH
Culture: NO GROWTH
Special Requests: ADEQUATE
Special Requests: ADEQUATE

## 2021-12-11 LAB — CULTURE, BLOOD (ROUTINE X 2): Culture: NO GROWTH

## 2021-12-12 ENCOUNTER — Ambulatory Visit: Payer: Medicare Other

## 2021-12-12 ENCOUNTER — Ambulatory Visit (HOSPITAL_COMMUNITY): Payer: Medicare Other

## 2021-12-12 ENCOUNTER — Other Ambulatory Visit: Payer: Medicare Other

## 2021-12-12 ENCOUNTER — Ambulatory Visit: Payer: Medicare Other | Admitting: Hematology and Oncology

## 2021-12-12 LAB — CULTURE, BLOOD (ROUTINE X 2)
Culture: NO GROWTH
Special Requests: ADEQUATE

## 2021-12-13 ENCOUNTER — Ambulatory Visit: Admission: RE | Admit: 2021-12-13 | Payer: Medicare Other | Source: Ambulatory Visit

## 2021-12-14 ENCOUNTER — Ambulatory Visit: Payer: Medicare Other

## 2021-12-14 NOTE — TOC Initial Note (Signed)
Transition of Care [facility]) - Initial/Assessment Note    Patient Details  Name: Tanya Mitchell MRN: [id number] Date of Birth: Jul 24, 1945  Transition of Care [facility]) CM/SW Contact:    [name], RN Phone Number: 2021/12/11, 10:05 AM  Clinical Narrative: From home.noted palliative care cons. Monitor for d/c needs.                  Expected Discharge Plan: Home/Self Care Barriers to Discharge: Continued Medical Work up   Patient Goals and CMS Choice Patient states their goals for this hospitalization and ongoing recovery are::  (Home)   Choice offered to / list presented to : NA  Expected Discharge Plan and Services Expected Discharge Plan: Home/Self Care   Discharge Planning Services: CM Consult   Living arrangements for the past 2 months: Single Family Home                                      Prior Living Arrangements/Services Living arrangements for the past 2 months: Single Family Home Lives with:: Spouse Patient language and need for interpreter reviewed:: Yes Do you feel safe going back to the place where you live?: Yes      Need for Family Participation in Patient Care: Yes (Comment) Care giver support system in place?: Yes (comment)   Criminal Activity/Legal Involvement Pertinent to Current Situation/Hospitalization: No - Comment as needed  Activities of Daily Living Home Assistive Devices/Equipment: None ADL Screening (condition at time of admission) Patient's cognitive ability adequate to safely complete daily activities?: No Is the patient deaf or have difficulty hearing?: No Does the patient have difficulty seeing, even when wearing glasses/contacts?: Yes (double vision in left eye - new symptom in the last month) Does the patient have difficulty concentrating, remembering, or making decisions?: Yes (new in the past week) Patient able to express need for assistance with ADLs?: Yes Does the patient have difficulty dressing or bathing?:  Yes Independently performs ADLs?: No Communication: Independent Dressing (OT): Needs assistance Is this a change from baseline?: Pre-admission baseline Grooming: Needs assistance Is this a change from baseline?: Pre-admission baseline Feeding: Needs assistance Is this a change from baseline?: Pre-admission baseline Bathing: Needs assistance Is this a change from baseline?: Pre-admission baseline Toileting: Needs assistance Is this a change from baseline?: Pre-admission baseline In/Out Bed: Needs assistance Is this a change from baseline?: Pre-admission baseline Walks in Home: Dependent Is this a change from baseline?: Pre-admission baseline Does the patient have difficulty walking or climbing stairs?: Yes Weakness of Legs: Both Weakness of Arms/Hands: Both  Permission Sought/Granted Permission sought to share information with : Case Manager Permission granted to share information with : Yes, Verbal Permission Granted  Share Information with NAME:  (Case Manager)           Emotional Assessment Appearance:: Appears stated age Attitude/Demeanor/Rapport: Gracious Affect (typically observed): Accepting Orientation: : Oriented to Self, Oriented to Place, Oriented to  Time, Oriented to Situation Alcohol / Substance Use: Not Applicable Psych Involvement: No (comment)  Admission diagnosis:  Thrombocytopenia ([place]) [D69.6] Physical deconditioning [R53.81] Septic shock (HCC) [A41.9, R65.21] Leukocytosis, unspecified type [D72.829] Sepsis, due to unspecified organism, unspecified whether acute organ dysfunction present [facility]) [A41.9] Patient Active Problem List   Diagnosis Date Noted   Septic shock ([place]) 11/14/2021   Diplopia 11/22/2021   Abnormal brain MRI 11/22/2021   Encounter for preoperative dental examination 10/31/2021   Teeth missing 10/31/2021  Periodontal disease 10/31/2021   Generalized gingival recession 10/31/2021   Accretions on teeth 10/31/2021   Dental  attrition, excessive 10/31/2021   Trismus 10/31/2021   Merkel cell carcinoma of lip ([place]) 40/37/5436   Acute systolic heart failure ([place])    Myeloproliferative disorder ([place]) 11/09/2020   MDS/MPN (myelodysplastic/myeloproliferative neoplasms) ([place]) 05/25/2020   Thrombocytosis 11/28/2017   Leukocytosis 11/28/2017   Breast neoplasm, Tis (DCIS), right 01/06/2013   Essential hypertension 05/12/2012   Melanoma of back, pT2NxMx 09/22/2011   Nonischemic cardiomyopathy ([place]) 08/26/2010   PCP:  Benay Pike, MD Pharmacy:   [facility] ([place]) [place], [place] [address] Phone: [phone] Fax: [phone]  [place], [place] - [address] AT [facility] [address] [place] [address] Phone: [phone] Fax: [phone]     Social Determinants of Health (SDOH) Interventions    Readmission Risk Interventions     No data to display

## 2021-12-14 NOTE — Progress Notes (Signed)
Prior-To-Admission Oral Antineoplastic Agent for Treatment of Oncologic Disease   Procedure Per Pharmacy & Therapeutics Committee Policy: Orders for continuation of home oral chemotherapy for treatment of an oncologic disease will be held unless approved by an oncologist during current admission.    For patients receiving oncology care at [facility], inpatient pharmacist contacts patient's oncologist during regular office hours to review. If earlier review is medically necessary, attending physician consults [facility] on-call oncologist  Spoke with patient's oncologist, Dr. Chryl Heck in regards to the hydroxyurea. Per oncologist orders, continue to HOLD hydroxyurea at this time.   Lenis Noon, PharmD 13-Dec-2021, 8:50 AM

## 2021-12-14 NOTE — Progress Notes (Signed)
  Daily Progress Note   Patient Name: Tanya Mitchell       Date: Jan 05, 2022 DOB: 26-Oct-1945  Age: 76 y.o. MRN#: [id number] Attending Physician: Margaretha Seeds, MD Primary Care Physician: Benay Pike, MD Admit Date: 12/11/2021 Length of Stay: 1 day  Full consult note will be completed as soon as able. Placing note at this time to update regarding patient's medical care. Family meeting held today. Patient now transitioned to comfort care. Will adjust medications accordingly to manage pain and agitation which is severely uncontrolled currently. Will continue levo to allow time for another family member to come to bedside and to get patient comfortable. Family has heard patient's time could be short and she could die will still on levo. Levophed will be discontinued later today. Updated care team.    Chelsea Aus, DO Palliative Care Provider PMT # [phone]

## 2021-12-14 NOTE — Progress Notes (Signed)
NAME:  Tanya Mitchell, MRN:  [id number], DOB:  1945/02/15, LOS: 1 ADMISSION DATE:  12/04/2021, CONSULTATION DATE: 11/24/2021 REFERRING MD:  Melina Copa ED Elvina Sidle, CHIEF COMPLAINT: Shock  History of Present Illness:  76 year old woman who presents with hypotension refractory to fluid bolus and an elevated lactate.  She has a complicated cancer history.  She has a remote history of breast cancer starting in the left breast in 1995 treated with left mastectomy and adjuvant chemotherapy followed by tamoxifen for 5 years.  She had no evidence of recurrence.  In 2007 she underwent a right lumpectomy followed by adjuvant radiation.  In July 2013 a malignant melanoma removed from her back.  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  She is also been found to have a myeloproliferative disorder for which she is being seen at [facility].  Since receiving her radiation therapy she is unable to eat due to pain swallowing she has not had much oral intake for the last week or so.  She has been seen on 2 occasions in oncology clinic with hypotension which resolved partially with IV fluids.  She presents again today with the same.  She has received approximately 1-1/2 L of fluid and remains hypotensive.  Her lactate is elevated at 5.7.  She has marked leukocytosis.  She is anemic with thrombocytopenia.  Pertinent  Medical History   Past Medical History:  Diagnosis Date   Breast cancer ([place]) 1995 and 2007   Cardiomyopathy secondary to chemotherapy [facility])    has improved    Hyperlipidemia    Hypertension    under control, has been on med. x 3 yrs.   Melanoma in situ of back [facility]) 09/2011   left   Personal history of chemotherapy    Personal history of radiation therapy    Skin cancer 2013   [facility] Events: Including procedures, antibiotic start and stop dates in addition to other pertinent events   10/24  -admitted to ICU. 10/25-Received PRBC x 2 and platelets x 1. On levophed 6.  Interim History / Subjective:  Husband at bedside. Continues to feel poorly Received PRBC x 2 and platelets x 1. On levophed 6.   Objective   Blood pressure (!) 117/55, pulse (!) 116, temperature 98.1 F (36.7 C), temperature source Axillary, resp. rate 14, height '5\' 2"'$  (1.575 m), weight 44.5 kg, SpO2 93 %.        Intake/Output Summary (Last 24 hours) at 09-Dec-2021 0717 Last data filed at 2021/12/09 0710 Gross per 24 hour  Intake 5013.98 ml  Output --  Net 5013.98 ml   Filed Weights   11/17/2021 1546  Weight: 44.5 kg   Physical Exam: General: Chronically ill-appearing, no acute distress HENT: [place], AT, limited mouth opening, dry mucous membranes with lip scabbing Eyes: left eye unable to move past midline laterally (recent x 2-3 weeks per husband), normal right EOMI, no scleral icterus Respiratory: Clear to auscultation bilaterally.  No crackles, wheezing or rales Cardiovascular: 2/6 holosystolic murmur, RR, -M/R/G, no JVD GI: BS+, soft, nontender Extremities:-Edema,-tenderness Neuro: AAO x4, CNII-XII grossly intact Psych: Normal mood, normal affect  Ancillary tests personally reviewed  Increased leukocytosis to 140 Marked leukocytosis at 114 with abundant immature granulocytes including promyelocytes and metamyelocytes. Thrombocytopenia at 9 Anemia 5.8 Elevated LFTs consistent with infiltrative pattern. Mild hypercalcemia. Creatinine still normal 0.47.  CT abdomen pelvis shows multiple hepatic lesions consistent with  metastatic disease there are enlarged lymph nodes and lesions in the spleen as well consistent with metastatic disease. Chest x-ray is clear Assessment & Plan:   Critically ill due to hypovolemic and possibly distributive shock due to either sepsis of unclear source or progressive overwhelming metastatic disease versus malignant transformation of myelodysplastic syndrome. Poor oral  intake with subsequent dehydration following radiation therapy Acute blood loss anemia Thrombocytopenia -S/p 2U PRBC and platelets x 1 -Reviewed AM labs. Hg improved. No further PRBC at this time -Transfuse additional 1U of platelets -Trend CBC q6h -Additional LR bolus 500 cc. Continue mIVF -Vanc and Cefepime. De-escalate pending culture results. -Titrate norepinephrine to keep SBP >100 or MAP greater than 65  Severe protein calorie malnutrition due to poor oral intake following radiation therapy for Merkel cell tumor. -Consider small bore feeding tube  Myelodysplastic syndrome with possible transformation.  Likely secondary to chemotherapy for prior breast cancer. Pancytopenia Merkel cell tumor s/p radiation -Oncology consulted -Transfusion as noted above -Based on recent oncology notes there may not be further therapeutic options.  Palliative consult in am  Best Practice (right click and "Reselect all SmartList Selections" daily)   Diet/type: NPO DVT prophylaxis: SCD GI prophylaxis: PPI Lines: Central line Foley:  Yes, and it is still needed Code Status:  DNR Last date of multidisciplinary goals of care discussion [husband and daughter updated.  They are well-informed regarding her medical condition.  I have indicated to them that there may ultimately not be much that we can do for her underlying malignancy.  Ultimately the plan for this evening would be to temporize with IV fluid, transfusion and antibiotics and to allow for a more complete discussion regarding treatment options with oncology in the morning.  I have indicated to them however that if she were to further decline this would be an indicator that her chance of recovering would be dismal.  They confirmed that she does have a DNR and have agreed to no CPR or intubation given her underlying condition.]  CRITICAL CARE The patient is critically ill with hypovolemic +/- septic shock and requires high complexity decision  making for assessment and support, frequent evaluation and titration of therapies, application of advanced monitoring technologies and extensive interpretation of multiple databases.  Independent Critical Care Time: 31 Minutes.   Rodman Pickle, M.D. [facility] Pulmonary/Critical Care Medicine 12/18/21 7:17 AM   Please see Amion for pager number to reach on-call Pulmonary and Critical Care Team.

## 2021-12-14 NOTE — Progress Notes (Signed)
eLink Physician-Brief Progress Note Patient Name: Tanya Mitchell DOB: 12-04-1945 MRN: [id number]   Date of Service  12/08/2021  HPI/Events of Note  Nursing request for order to transfuse 1 unit PRBC so that the patient can get a total of 3 units desired per Dr. Renato Battles note.  eICU Interventions  Will order an additional unit PRBC transfused.      Intervention Category Major Interventions: Other:  Lysle Dingwall 12/08/2021, 5:45 AM

## 2021-12-14 NOTE — Progress Notes (Signed)
Pt has 2 diamond rings on her left hand and 1 diamond ring on her right hand. All rings were left on the patient.

## 2021-12-14 NOTE — Progress Notes (Addendum)
Pharmacy Antibiotic Note  Tanya Mitchell is a 76 y.o. female admitted on 11/26/2021 with sepsis.  Pharmacy has been consulted for Cefepime + Vancomycin dosing.  Plan: Cefepime 2gm IV q12h Vancomycin '500mg'$  IV q48h to target AUC 400-550. Estimated AUC 403. Check Vancomycin levels at steady state Monitor renal function and cx data   Height: '5\' 2"'$  (157.5 cm) Weight: 44.5 kg (98 lb) IBW/kg (Calculated) : 50.1  Temp (24hrs), Avg:98.4 F (36.9 C), Min:97.5 F (36.4 C), Max:99.5 F (37.5 C)  Recent Labs  Lab 12/01/21 1237 12/05/21 1430 12/05/21 1816 11/14/2021 1253 11/18/2021 1554 11/21/2021 1559 11/25/2021 1851 11/30/2021 2232  WBC 122.3* 126.8*  --  117.4* 114.6*  --   --   --   CREATININE 0.77 0.80  --  0.65 0.47  --   --   --   LATICACIDVEN  --  6.8* 2.8*  --   --  4.8* 5.4* 4.8*    Estimated Creatinine Clearance: 41.4 mL/min (by C-G formula based on SCr of 0.47 mg/dL).    No Known Allergies  Antimicrobials this admission: 10/24 Cefepime >>  10/24 Vancomycin >>  10/24 Metronidazole  Dose adjustments this admission:  Microbiology results: 10/24 BCx:  10/24 Resp PCR: Cov/Flu neg 10/23 BCx: NGTD  Thank you for allowing pharmacy to be a part of this patient's care.  Netta Cedars PharmD 12/03/2021 12:49 AM

## 2021-12-14 NOTE — Progress Notes (Shared)
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, face and neck, Diagnosed 10/21/2021 (Active)  Cancer Staging:  Cancer Staging  Merkel cell carcinoma of lip (HCC) Staging form: Merkel Cell Carcinoma, AJCC 8th Edition - Pathologic stage from 10/21/2021: Stage IIIB (pT2, pN1b, cM0) - Signed by Eppie Gibson, MD on 10/21/2021 Stage prefix: Initial diagnosis Primary tumor: Present   Indication for Treatment: Merkel cell carcinoma of lower lip with numerous lymph node metastases  Radiation Treatment Dates: 11/08/2021 - 12/02/2021 Treatment ended early***  Total Dose: 41.4 Gy Dose per Fraction: 2.3 Number of Fractions: 18   Technique: IMRT  [employer]:   Narrative: The patient tolerated radiation therapy relatively well. ***  Plan: The patient has completed radiation treatment. The patient will return to radiation oncology clinic for routine follow-up in one month. I advised them to call or return sooner if they have any questions or concerns related to their recovery or treatment. ______________________________________   Eppie Gibson, MD

## 2021-12-14 NOTE — Consult Note (Signed)
Consultation Note Date: 12/13/21   Patient Name: Tanya Mitchell  DOB: April 28, 1945  MRN: [id number]  Age / Sex: 76 y.o., female  PCP: Benay Pike, MD Referring Physician: Margaretha Seeds, MD  Reason for Consultation: Goals of care  HPI/Patient Profile: 76 y.o. female  who admitted on 11/14/2021 with hypotension refractory to fluid bolusing and elevated lactic acid. Patient has been receiving radiation for the treatment of Merkel Cell Cancer. Recent diagnosis and treatment at [facility] for Myelodysplastic Disorder. She has a complicated cancer history with breast cancer, mastectomy, chemotherapy, and radiation. In 2013, patient had a malignant melanoma removed from her back.  Family already had a chance to discuss care with oncology prior to being seen.   Discussion with bedside nurse prior to seeing patient and family. Nursing staff report patient showing signs of pain, urinary retention, and had excessive bleeding this morning with oral care.  Presented to bedside for visit. Patient observed in fetal position, squirming in the bed. She makes eye contact and is able to state that she is not hurting when asked. Respirations increased and labored with movement. Lips are noted to be dry, cracked, and bloody.   Primary Decision Maker NEXT OF KIN Marrianne Mood Husband  Discussion: ------------------------------------------------------------------------------------------------------------- Advance Care Planning Conversation  Pertinent diagnosis: Merkel cell tumor receiving radiation, myeloproliferative disorder, hypotension possible secondary to distributive vs septic shock  The patient and/or family consented to a voluntary Advance Care Planning Conversation. Individuals present for the conversation: patient's husband, daughter, step-daughter, palliative providers; patient unable to participate in  complex discussion due to confusion  Summary of the conversation:  Met with husband Coralyn Mark and daughter, Zigmund Daniel, in conference room, per their request. Coralyn Mark reports they met with Oncology this morning and realizing that his wife is not doing well. He states that he has noted a decline in his wife's mental status and expresses concern regarding oral bleeding and her inability to eat for ~5 days. Discussed the function of the liver and the impact of advanced cancer on organs. Education provided regarding liver's role in detoxification, fluid/protein balance, and blood clotting. Family has questions about role of g-tube placement in treatment plan. Education provided regarding the benefits and risks associated with g-tube placement at this juncture, especially noting that it will not change the underlying disease process of cancer. After this discussion, Coralyn Mark states that he wants pain management and comfort to be the focus for his wife's care moving forward.   Dr. Vinetta Bergamo also discussed with family regarding prognosis and plan for medical care moving forward. Explained the transition to comfort care and focus on symptom management. Family in agreement with beginning medications for anxiety and pain, stopping IVFs, lab draws, and interventions not focused on patient's comfort at the end of life, and discontinuing Levophed gtt once patient is comfortable. Discussed prognosis of possibly hours to days once transitioned to comfort focused care and pressor support discontinued. Family acknowledges this and again confirms focus should be on comfort at the end of life. Family stated that  there is a young granddaughter to be picked up from school to attempt visiting and family aware that chaplain available for additional support.Spent time providing emotional support.   Outcome of the conversations and/or documents completed:  Transitioning to comfort focused care.   I spent 27 minutes providing separately identifiable  ACP services with the patient and/or surrogate decision maker in a voluntary, in-person conversation discussing the patient's wishes and goals as detailed in the above note.  Chelsea Aus, DO Palliative Care Provider  -------------------------------------------------------------------------------------------------------------  All questions answered at that time.  Updated care team members regarding transition to comfort focused care.    SUMMARY OF RECOMMENDATIONS   -Transitioning focus to comfort care at this time. Allowing levophed to allow more time for family to visit today and for comfort to be achieved. Will be discontinued later today.  -Discontinue interventions not focused on symptom management at the end of life such as IV antibiotics, IVF, lab draws, etc. -Initiate basal Dilaudid infusion at 0.12m/hr to assist with pain and dyspnea management. Allow for RN bolus of 0.562mq1582m prn for breakthrough pain/dyspnea management.  -Start Ativian 0.5mg71mhrs prn anxiety, agitation. Dose may need to be further adjusted based on symptom response.  - Start IV glycopyrrolate for secretion management as needed at the end of life.  -Stop prn IV fentanyl as was not adequately controlling patient's symptoms.   Code Status/Advance Care Planning: DNR  Prognosis:   Hours - Days  Discharge Planning: Likely patient will pass away in the hospital. Should patient be stable enough in over 24 hours, could consider residential hospice house.  Physical Exam Constitutional:      Appearance: She is ill-appearing, cachectic, agitated HENT:     Mouth/Throat:     Mouth: Mucous membranes are dry.     Comments: Cracked, Bloody. Eyes:     General: Scleral icterus present.  Cardiovascular:     Rate and Rhythm: Tachycardia present.  Pulmonary:     Comments: Intermittent labored breathing.  Vital Signs: BP (!) 123/50   Pulse (!) 115   Temp 98.5 F (36.9 C) (Axillary)   Resp (!) 24   Ht '5\' 2"'  (1.575  m)   Wt 44.5 kg   SpO2 93%   BMI 17.92 kg/m  Pain Scale: CPOT   SpO2: SpO2: 93 % O2 Device:SpO2: 93 % O2 Flow Rate: .O2 Flow Rate (L/min): 2 L/min  IO: Intake/output summary:  Intake/Output Summary (Last 24 hours) at 10/211-05-237 Last data filed at 10/211-05-234 Gross per 24 hour  Intake 5177.15 ml  Output 300 ml  Net 4877.15 ml    LBM: Last BM Date :  (PTA) Baseline Weight: Weight: 44.5 kg Most recent weight: Weight: 44.5 kg       Thank you for this consult. Palliative medicine will continue to follow and assist as needed.   Signed by: AngeMoss Mc MSN [facility] Student Palliative Medicine    Please contact Palliative Medicine Team phone at [phone] questions and concerns.  For individual provider: See Amion  I have reviewed, seen, examined, and discussed the care of this patient in detail with the NP student AngeMoss Mcluding pertinent patient records, physical exam findings, and data. I have updated the note accordingly and agree with details of the encounter stated in above note (GC).   LaurChelsea Aus Palliative Care Provider [phone]

## 2021-12-14 NOTE — Progress Notes (Addendum)
Chaplain engaged in an initial visit with Alahni, her daughter, husband, step-daughter, and granddaughter.  Chaplain worked to engage in Theatre stage manager of storytelling and listening.  Daughter was able to share that Nellie is originally from [place] and moved to [place] in 1981.  Anabela was dedicated to providing care for her son who was disabled and who passed earlier this year.  Daughter shared that her mom will finally be able to converse with her son again, or possibly for the first time, representing her view of spirituality and the afterlife.   Chaplain made assessment in conversation that family valued their solitude and time together as a unit and did not have any needs for Chaplain at this time.    Chaplain worked to be a compassionate presence and offer support as needed.   Neave Lenger, MDiv [phone]   2021-12-28 1400  Clinical Encounter Type  Visited With Patient and family together  Visit Type Initial;Spiritual support  Referral From Palliative care team  Consult/Referral To Chaplain

## 2021-12-14 NOTE — Death Summary Note (Signed)
DEATH SUMMARY   Patient Details  Name: Tanya Mitchell MRN: [id number] DOB: Oct 24, 1945  Admission/Discharge Information   Admit Date:  2021/12/25  Date of Death: Date of Death: 2021/12/26  Time of Death: Time of Death: [place]  Length of Stay: 1  Referring Physician: Benay Pike, MD   Reason(s) for Hospitalization  Hypotension  Diagnoses  Preliminary cause of death:  Secondary Diagnoses (including complications and co-morbidities):  Principal Problem:   Septic shock ([place]) Active Problems:   Essential hypertension   Physical deconditioning   Merkel cell cancer ([place])   Counseling and coordination of care   Goals of care, counseling/discussion   Palliative care encounter   Sepsis ([place])   High risk medication use   Hypovolemic shock ([place])   Acute blood loss anemia   Thrombocytopenia [facility])   [place] [facility] Course (including significant findings, care, treatment, and services provided and events leading to death)  [name] is a 76 y.o. year old female who presents with hypotension refractory to fluid bolus and an elevated lactate.   She has a complicated cancer history.  She has a remote history of breast cancer starting in the left breast in 1995 treated with left mastectomy and adjuvant chemotherapy followed by tamoxifen for 5 years.  She had no evidence of recurrence.  In 2007 she underwent a right lumpectomy followed by adjuvant radiation.  In July 2013 a malignant melanoma removed from her back.   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  She is also been found to have a myeloproliferative disorder for which she is being seen at [facility].   Since receiving her radiation therapy she is unable to eat due to pain swallowing she has not had much oral intake for the last week or so.  She has been seen on 2 occasions in oncology clinic with hypotension which resolved partially with IV fluids.    She presents again today with the same.  She has received approximately 1-1/2 L of fluid and remains hypotensive.  Her lactate is elevated at 5.7.  She has marked leukocytosis.  She is anemic with thrombocytopenia.  Overnight she was started broad spectrum antibiotics, IVFs, vasopressor support and PRBC x 2 and platelets x 2. Oncology was consulted this morning and communicated that no further medical management would be offered. Palliative was consulted and after discussion, Tanya Mitchell was transitioned to comfort care. She expired at 18:50 on December 26, 2021.  Pertinent Labs and Studies  Significant Diagnostic Studies CT ABDOMEN W CONTRAST  Result Date: December 25, 2021 CLINICAL DATA:  Invasive breast cancer stage IV. Initial workup. * Tracking Code: BO * EXAM: CT CHEST AND ABDOMEN WITHOUT CONTRAST TECHNIQUE: Multidetector CT imaging of the chest and abdomen was performed following the standard protocol without intravenous contrast. RADIATION DOSE REDUCTION: This exam was performed according to the departmental dose-optimization program which includes automated exposure control, adjustment of the mA and/or kV according to patient size and/or use of iterative reconstruction technique. COMPARISON:  PET-CT December 02 2014. FINDINGS: CT CHEST FINDINGS WITHOUT CONTRAST Cardiovascular: Aortic atherosclerosis. No central pulmonary embolus on this nondedicated study. Normal size heart. No significant pericardial effusion/thickening. Coronary artery calcifications. Calcifications of the mitral annulus. Mediastinum/Nodes: Ill-defined soft tissue stranding in the thoracic inlet for instance on image 12/3. No suspicious thyroid nodule. No pathologically enlarged mediastinal, hilar or axillary lymph nodes. The esophagus is grossly unremarkable. Lungs/Pleura: Scattered tiny pulmonary nodules do not appear changed from  prior PET-CT dated December 02, 2014. For reference: 3 mm left lower lobe pulmonary nodule on image 75/6 is  unchanged. 4 mm subpleural pulmonary nodule in the right lower lobe is unchanged. No new suspicious pulmonary nodules or masses. Trace bilateral pleural effusions with mild bilateral septal thickening. Musculoskeletal: Cachexia. Clips in the left breast and axilla. Subcutaneous body wall edema. No aggressive lytic or blastic lesion of bone. CT ABDOMEN FINDINGS WITHOUT CONTRAST Hepatobiliary: Hepatomegaly measuring 24.3 cm in maximum craniocaudal dimension. Innumerable bilobar hypodense hepatic lesions. For reference: -segment VII hepatic lesion measures 2.3 cm on image 11/4. -segment III hepatic lesion measures 2.2 cm on image 29/4. Gallbladder is unremarkable.  No biliary ductal dilation. Pancreas: No pancreatic ductal dilation or evidence of acute inflammation. Spleen: Splenomegaly measuring 13.7 cm in maximum craniocaudal dimension. Lobular hypodense splenic lesions measure up to 4.9 cm on image 24/4. Adrenals/Urinary Tract: Hyperdense/enhancing 6 mm nodule in the posterior right adrenal gland on image 30/4 appears to have been subtly evident on PET-CT December 02, 2014 measuring 4 mm. No discrete left-sided adrenal nodule. Kidneys demonstrate symmetric enhancement. No solid enhancing renal mass. Stomach/Bowel: Stomach is nondistended limiting evaluation. No pathologic dilation or evidence of acute inflammation involving loops of large or small bowel in the abdomen. Vascular/Lymphatic: Aortic atherosclerosis. No abdominal aortic aneurysm. Evaluation for abdominal adenopathy is limited by paucity of peritoneal fat and technique. Within this context there is an enlarged gastrohepatic ligament lymph node which measures 15 mm in short axis and scattered prominent retroperitoneal lymph nodes for instance a left periaortic lymph node at the level of the left renal vein measuring 7 mm in short axis on image 38/4. Other: Surgical clips in the anterior abdominal wall commonly reflect sequela of breast flap reconstruction  post mastectomy. Anasarca. Cachexia. Musculoskeletal: No aggressive lytic or blastic lesion of bone IMPRESSION: 1. Innumerable bilobar hypodense hepatic lesions, consistent with metastatic disease. 2. Enlarged gastrohepatic ligament lymph node and scattered prominent retroperitoneal lymph nodes, suspicious for metastatic disease. 3. Splenomegaly with multiple hypodense splenic lesions measuring up to 4.9 cm, nonspecific but new from prior and suspicious for metastatic disease. 4. Hyperdense/enhancing 6 mm right adrenal nodule appears to have been subtly evident on PET-CT December 02 2014 measuring 4 mm and favored to reflect a benign adenoma. However, attention on follow-up imaging is suggested. 5. Nonspecific ill-defined soft tissue stranding in the thoracic inlet without discrete supraclavicular or mediastinal adenopathy. Attention on follow-up imaging suggested. 6. Scattered tiny pulmonary nodules are similar to prior PET-CT dated December 02, 2014, and compatible with a benign etiology, no new suspicious pulmonary nodules identified. 7. Trace bilateral pleural effusions with mild bilateral septal thickening, suggestive of pulmonary edema. 8. Cachexia cachectic habitus. 9. Anasarca, compatible with third-spacing. Electronically Signed   By: Dahlia Bailiff M.D.   On: 12/09/2021 18:56   CT Chest W Contrast  Result Date: 12/13/2021 CLINICAL DATA:  Invasive breast cancer stage IV. Initial workup. * Tracking Code: BO * EXAM: CT CHEST AND ABDOMEN WITHOUT CONTRAST TECHNIQUE: Multidetector CT imaging of the chest and abdomen was performed following the standard protocol without intravenous contrast. RADIATION DOSE REDUCTION: This exam was performed according to the departmental dose-optimization program which includes automated exposure control, adjustment of the mA and/or kV according to patient size and/or use of iterative reconstruction technique. COMPARISON:  PET-CT December 02 2014. FINDINGS: CT CHEST FINDINGS  WITHOUT CONTRAST Cardiovascular: Aortic atherosclerosis. No central pulmonary embolus on this nondedicated study. Normal size heart. No significant pericardial effusion/thickening. Coronary artery calcifications.  Calcifications of the mitral annulus. Mediastinum/Nodes: Ill-defined soft tissue stranding in the thoracic inlet for instance on image 12/3. No suspicious thyroid nodule. No pathologically enlarged mediastinal, hilar or axillary lymph nodes. The esophagus is grossly unremarkable. Lungs/Pleura: Scattered tiny pulmonary nodules do not appear changed from prior PET-CT dated December 02, 2014. For reference: 3 mm left lower lobe pulmonary nodule on image 75/6 is unchanged. 4 mm subpleural pulmonary nodule in the right lower lobe is unchanged. No new suspicious pulmonary nodules or masses. Trace bilateral pleural effusions with mild bilateral septal thickening. Musculoskeletal: Cachexia. Clips in the left breast and axilla. Subcutaneous body wall edema. No aggressive lytic or blastic lesion of bone. CT ABDOMEN FINDINGS WITHOUT CONTRAST Hepatobiliary: Hepatomegaly measuring 24.3 cm in maximum craniocaudal dimension. Innumerable bilobar hypodense hepatic lesions. For reference: -segment VII hepatic lesion measures 2.3 cm on image 11/4. -segment III hepatic lesion measures 2.2 cm on image 29/4. Gallbladder is unremarkable.  No biliary ductal dilation. Pancreas: No pancreatic ductal dilation or evidence of acute inflammation. Spleen: Splenomegaly measuring 13.7 cm in maximum craniocaudal dimension. Lobular hypodense splenic lesions measure up to 4.9 cm on image 24/4. Adrenals/Urinary Tract: Hyperdense/enhancing 6 mm nodule in the posterior right adrenal gland on image 30/4 appears to have been subtly evident on PET-CT December 02, 2014 measuring 4 mm. No discrete left-sided adrenal nodule. Kidneys demonstrate symmetric enhancement. No solid enhancing renal mass. Stomach/Bowel: Stomach is nondistended limiting  evaluation. No pathologic dilation or evidence of acute inflammation involving loops of large or small bowel in the abdomen. Vascular/Lymphatic: Aortic atherosclerosis. No abdominal aortic aneurysm. Evaluation for abdominal adenopathy is limited by paucity of peritoneal fat and technique. Within this context there is an enlarged gastrohepatic ligament lymph node which measures 15 mm in short axis and scattered prominent retroperitoneal lymph nodes for instance a left periaortic lymph node at the level of the left renal vein measuring 7 mm in short axis on image 38/4. Other: Surgical clips in the anterior abdominal wall commonly reflect sequela of breast flap reconstruction post mastectomy. Anasarca. Cachexia. Musculoskeletal: No aggressive lytic or blastic lesion of bone IMPRESSION: 1. Innumerable bilobar hypodense hepatic lesions, consistent with metastatic disease. 2. Enlarged gastrohepatic ligament lymph node and scattered prominent retroperitoneal lymph nodes, suspicious for metastatic disease. 3. Splenomegaly with multiple hypodense splenic lesions measuring up to 4.9 cm, nonspecific but new from prior and suspicious for metastatic disease. 4. Hyperdense/enhancing 6 mm right adrenal nodule appears to have been subtly evident on PET-CT December 02 2014 measuring 4 mm and favored to reflect a benign adenoma. However, attention on follow-up imaging is suggested. 5. Nonspecific ill-defined soft tissue stranding in the thoracic inlet without discrete supraclavicular or mediastinal adenopathy. Attention on follow-up imaging suggested. 6. Scattered tiny pulmonary nodules are similar to prior PET-CT dated December 02, 2014, and compatible with a benign etiology, no new suspicious pulmonary nodules identified. 7. Trace bilateral pleural effusions with mild bilateral septal thickening, suggestive of pulmonary edema. 8. Cachexia cachectic habitus. 9. Anasarca, compatible with third-spacing. Electronically Signed   By:  Dahlia Bailiff M.D.   On: 12/05/2021 18:56   CT Soft Tissue Neck W Contrast  Result Date: 11/16/2021 CLINICAL DATA:  Occult malignancy. Patient is being treated for Merkel cell cancer. Remote history breast cancer. Hypotension and anemia. EXAM: CT NECK WITH CONTRAST TECHNIQUE: Multidetector CT imaging of the neck was performed using the standard protocol following the bolus administration of intravenous contrast. RADIATION DOSE REDUCTION: This exam was performed according to the departmental dose-optimization program which  includes automated exposure control, adjustment of the mA and/or kV according to patient size and/or use of iterative reconstruction technique. CONTRAST:  140m OMNIPAQUE IOHEXOL 300 MG/ML  SOLN COMPARISON:  CT of the [place] FINDINGS: Pharynx and larynx: Diffuse edematous changes are present throughout the neck. No focal mucosal lesions are present. Salivary glands: No focal salivary lesion.  No duct obstruction. Thyroid: Normal Lymph nodes: No residual discrete adenopathy is present. Previously seen lymph nodes are no longer visible. Vascular: Atherosclerotic calcifications are present at the carotid bifurcations bilaterally. No significant stenosis is present. Limited intracranial: Within normal limits. Visualized orbits: Bilateral lens replacements are noted. Globes and orbits are otherwise unremarkable. Mastoids and visualized paranasal sinuses: The paranasal sinuses and mastoid air cells are clear. Skeleton: No discrete osseous lesions are present. Upper chest: The lung apices are clear. Thoracic inlet is within normal limits. Previously noted mass lesion on the lower lip is no longer visible. IMPRESSION: 1. Diffuse edematous changes throughout the neck compatible with radiation therapy. 2. No residual discrete adenopathy. Previously seen lymph nodes are no longer visible. 3. Atherosclerosis at the carotid bifurcations bilaterally without significant  stenosis. Electronically Signed   By: CSan MorelleM.D.   On: 11/21/2021 18:51   DG Chest Port 1 View  Result Date: 11/16/2021 CLINICAL DATA:  Questionable sepsis - evaluate for abnormality EXAM: PORTABLE CHEST 1 VIEW COMPARISON:  Radiograph 12/05/2021 FINDINGS: Unchanged cardiomediastinal silhouette. Low lung volumes. Mild interstitial opacities likely due to vascular crowding. There is potential medial apical airspace disease. No pleural effusion. No evidence of pneumothorax. No acute osseous abnormality. Thoracic spondylosis. Surgical clips overlie the left chest. IMPRESSION: Low lung volumes with vascular crowding and potential airspace opacities in the medial apices. A chest CT has been ordered, per medical record. Electronically Signed   By: JMaurine SimmeringM.D.   On: 11/27/2021 17:58   DG Chest Port 1 View  Result Date: 12/05/2021 CLINICAL DATA:  Hypotension. Decreased oral intake. Personal history of melanoma and breast cancer. EXAM: PORTABLE CHEST 1 VIEW COMPARISON:  Two-view chest x-ray 10/05/2011 FINDINGS: Heart size is normal. Lung volumes are low. No focal airspace disease is present. No edema or effusion is present. Postoperative changes are present in the left axilla and over the left chest. IMPRESSION: 1. Low lung volumes. 2. No acute cardiopulmonary disease. Electronically Signed   By: CSan MorelleM.D.   On: 12/05/2021 15:11   MR Brain W Wo Contrast  Result Date: 11/15/2021 CLINICAL DATA:  Marked cell carcinoma with new symptoms of double vision. Concern for metastatic disease. EXAM: MRI HEAD WITHOUT AND WITH CONTRAST TECHNIQUE: Multiplanar, multiecho pulse sequences of the brain and surrounding structures were obtained without and with intravenous contrast. CONTRAST:  5 cc Vueway COMPARISON:  None Available. FINDINGS: Brain: There is a small focus of DWI signal abnormality with associated T2/FLAIR hyperintensity and heterogeneous SWI signal dropout in the right  postcentral gyrus. There is intrinsic T1 hyperintensity with no definite superimposed surrounding enhancement. There is no surrounding edema or mass effect. This finding may reflect a cavernoma; however, in the absence of prior imaging, a hemorrhagic metastatic lesion can not be entirely excluded. There is no other diffusion signal abnormality. There is no other evidence of acute intracranial hemorrhage or extra-axial fluid collection. Background parenchymal volume is normal. The ventricles are normal in size. Gray-white differentiation is preserved. Patchy small foci of FLAIR signal abnormality in the supratentorial white matter are nonspecific but likely reflects sequela of chronic small  vessel ischemic change. The pituitary and suprasellar region are normal. There is no abnormality of the optic chiasm. There is no abnormal enhancement. There is no other evidence of intracranial metastatic disease. There is no mass effect or midline shift. Vascular: Normal flow voids. Skull and upper cervical spine: There is diffuse T1 hypointensity throughout the imaged bone marrow. Sinuses/Orbits: The paranasal sinuses are clear. Bilateral lens implants are in place. The globes and orbits are otherwise unremarkable. The optic nerves are unremarkable on these nondedicated sequences. Other: None. IMPRESSION: 1. Small focus of T2/FLAIR signal abnormality with T1 hyperintensity and SWI signal dropout in the right postcentral gyrus without mass effect or surrounding parenchymal edema may reflect a small cavernoma with possible small volume acute blood; however, in the absence of prior studies for comparison, a small hemorrhagic metastatic lesion can not be entirely excluded. Recommend follow-up in 2-3 months to assess for stability. 2. No other suspicious parenchymal signal abnormality to suggest intracranial metastatic disease. 3. Diffusely abnormal T1 marrow signal is nonspecific but is likely related to the patient's history of  hematologic abnormality. Diffuse osseous metastatic disease could have a similar appearance. Correlate with prior bone marrow biopsy results. Electronically Signed   By: Valetta Mole M.D.   On: 11/15/2021 13:37    Microbiology Recent Results (from the past 240 hour(s))  Blood Culture (routine x 2)     Status: None (Preliminary result)   Collection Time: 12/05/21  2:30 PM   Specimen: Right Antecubital; Blood  Result Value Ref Range Status   Specimen Description   Final    RIGHT ANTECUBITAL BLOOD Performed at [facility], [place] [address] [place], [place] [address]    Special Requests   Final    Blood Culture adequate volume BOTTLES DRAWN AEROBIC AND ANAEROBIC Performed at [place] [address] [place], [place] [address]    Culture   Final    NO GROWTH 2 DAYS Performed at [place] [address] [place], [place] [address]    Report Status PENDING  Incomplete  Blood Culture (routine x 2)     Status: None (Preliminary result)   Collection Time: 12/05/21  4:10 PM   Specimen: BLOOD  Result Value Ref Range Status   Specimen Description   Final    BLOOD LEFT ANTECUBITAL Performed at [place] [address], [place], [place] [address]    Special Requests   Final    BOTTLES DRAWN AEROBIC AND ANAEROBIC Blood Culture adequate volume Performed at [place] [address], [place], [place] [address]    Culture   Final    NO GROWTH 2 DAYS Performed at [place] [address] [place], [place] [address]    Report Status PENDING  Incomplete  Blood culture (routine x 2)     Status: None (Preliminary result)   Collection Time: 11/14/2021  4:04 PM   Specimen: BLOOD  Result Value Ref Range Status   Specimen Description   Final    BLOOD BLOOD RIGHT ARM Performed at [facility] [address], [place], [place] [address]    Special Requests   Final    BOTTLES  DRAWN AEROBIC AND ANAEROBIC Blood Culture results may not be optimal due to an excessive volume of blood received in culture bottles Performed at [facility] [place] [address] [place], [place] [address]    Culture   Final    NO  GROWTH < 24 HOURS Performed at [facility], [place] [address] [place], [place] [address]    Report Status PENDING  Incomplete  Resp Panel by RT-PCR (Flu A&B, Covid) Anterior Nasal Swab     Status: None   Collection Time: 12/02/2021  5:39 PM   Specimen: Anterior Nasal Swab  Result Value Ref Range Status   SARS Coronavirus 2 by RT PCR NEGATIVE NEGATIVE Final    Comment: (NOTE) SARS-CoV-2 target nucleic acids are NOT DETECTED.  The SARS-CoV-2 RNA is generally detectable in upper respiratory specimens during the acute phase of infection. The lowest concentration of SARS-CoV-2 viral copies this assay can detect is 138 copies/mL. A negative result does not preclude SARS-Cov-2 infection and should not be used as the sole basis for treatment or other patient management decisions. A negative result may occur with  improper specimen collection/handling, submission of specimen other than nasopharyngeal swab, presence of viral mutation(s) within the areas targeted by this assay, and inadequate number of viral copies(<138 copies/mL). A negative result must be combined with clinical observations, patient history, and epidemiological information. The expected result is Negative.  Fact Sheet for Patients:  EntrepreneurPulse.com.au  Fact Sheet for Healthcare Providers:  IncredibleEmployment.be  This test is no t yet approved or cleared by the [place] FDA and  has been authorized for detection and/or diagnosis of SARS-CoV-2 by FDA under an Emergency Use Authorization (EUA). This EUA will remain  in effect (meaning this test can be used) for the duration of the COVID-19 declaration under Section 564(b)(1) of  the Act, 21 U.S.C.section 360bbb-3(b)(1), unless the authorization is terminated  or revoked sooner.       Influenza A by PCR NEGATIVE NEGATIVE Final   Influenza B by PCR NEGATIVE NEGATIVE Final    Comment: (NOTE) The Xpert Xpress SARS-CoV-2/FLU/RSV plus assay is intended as an aid in the diagnosis of influenza from Nasopharyngeal swab specimens and should not be used as a sole basis for treatment. Nasal washings and aspirates are unacceptable for Xpert Xpress SARS-CoV-2/FLU/RSV testing.  Fact Sheet for Patients: EntrepreneurPulse.com.au  Fact Sheet for Healthcare Providers: IncredibleEmployment.be  This test is not yet approved or cleared by the [place] FDA and has been authorized for detection and/or diagnosis of SARS-CoV-2 by FDA under an Emergency Use Authorization (EUA). This EUA will remain in effect (meaning this test can be used) for the duration of the COVID-19 declaration under Section 564(b)(1) of the Act, 21 U.S.C. section 360bbb-3(b)(1), unless the authorization is terminated or revoked.  Performed at [facility], [place] [address], [place], [place] [address]   MRSA Next Gen by PCR, Nasal     Status: None   Collection Time: 2021/12/28 12:17 AM   Specimen: Nasal Mucosa; Nasal Swab  Result Value Ref Range Status   MRSA by PCR Next Gen NOT DETECTED NOT DETECTED Final    Comment: (NOTE) The GeneXpert MRSA Assay (FDA approved for NASAL specimens only), is one component of a comprehensive MRSA colonization surveillance program. It is not intended to diagnose MRSA infection nor to guide or monitor treatment for MRSA infections. Test performance is not FDA approved in patients less than 12 years old. Performed at [facility], [place] [address] [place], [place] [address]     Lab Basic Metabolic Panel: Recent Labs  Lab 12/01/21 1237 12/05/21 1430 11/26/2021 1253 11/21/2021 1554  December 28, 2021 0614  NA 134* 139 141 140 141  K 4.4 4.6 4.5 4.4 3.8  CL 99  102 109 109 112*  CO2 '25 22 24 ' 20* 22  GLUCOSE 169* 203* 145* 137* 161*  BUN 34* 58* 49* 51* 47*  CREATININE 0.77 0.80 0.65 0.47 0.56  CALCIUM 10.4* 11.6* 11.2* 10.5* 10.6*  MG  --   --   --   --  1.8  PHOS  --   --   --   --  2.4*   Liver Function Tests: Recent Labs  Lab 12/01/21 1237 12/05/21 1430 12/11/2021 1253 11/28/2021 1554  AST 82* 177* 240* 258*  ALT 54* 141* 161* 172*  ALKPHOS 334* 344* 278* 259*  BILITOT 1.0 1.6* 1.7* 1.7*  PROT 6.6 6.6 5.4* 5.4*  ALBUMIN 3.4* 3.1* 2.9* 2.6*   No results for input(s): "LIPASE", "AMYLASE" in the last 168 hours. No results for input(s): "AMMONIA" in the last 168 hours. CBC: Recent Labs  Lab 12/01/21 1237 12/05/21 1430 12/03/2021 1253 11/14/2021 1554 Jan 01, 2022 0728  WBC 122.3* 126.8* 117.4* 114.6* 140.7*  NEUTROABS 98.0* 97.6* 91.2*  --   --   HGB 7.9* 7.7* 6.2* 5.8* 8.3*  HCT 22.8* 23.1* 18.0* 17.7* 24.3*  MCV 97.9 99.1 96.3 100.0 87.4  PLT 10* 10* 8* 9* 13*   Cardiac Enzymes: No results for input(s): "CKTOTAL", "CKMB", "CKMBINDEX", "TROPONINI" in the last 168 hours. Sepsis Labs: Recent Labs  Lab 12/05/21 1430 12/05/21 1816 11/19/2021 1253 11/13/2021 1554 11/22/2021 1559 12/11/2021 2232 2022-01-01 0135 01-01-22 0728 01-01-2022 0729 01-01-2022 1013  WBC 126.8*  --  117.4* 114.6*  --   --   --  140.7*  --   --   LATICACIDVEN 6.8*   < >  --   --    < > 4.8* 2.3*  --  1.8 1.9   < > = values in this interval not displayed.    Procedures/Operations  None   Aundria Bitterman Rodman Pickle Jan 01, 2022, 6:50 PM

## 2021-12-14 DEATH — deceased

## 2021-12-16 NOTE — Therapy (Signed)
[facility] [address] [address], [place], [place], [address] Phone: [phone]   Fax:  [phone]  Patient Details  Name: Tanya Mitchell MRN: [id number] Date of Birth: 1945/10/27 Referring Provider:  Eppie Gibson, MD  Encounter Date: 12/16/2021  SPEECH THERAPY DISCHARGE SUMMARY  Visits from Start of Care: 2  Current functional level related to goals / functional outcomes: See below for STGs/LTGs and plan in last session 12-01-21   SHORT TERM GOALS: Target date:  2 therapy visits (visit #3)       pt will complete HEP with rare min A  Baseline: Goal status: Met   2.  pt will tell SLP why pt is completing HEP with modified independence Baseline:  Goal status: Ongoing   3.  pt will describe 3 overt s/s aspiration PNA with modified independence Baseline:  Goal status: Ongoing     LONG TERM GOALS: Target date:6 therapy visits (visit #7)      pt will complete HEP with modified independnence in 2 sessions Baseline:  Goal status: Ongoing   2.  pt will describe how to modify HEP over time, and the timeline associated with reduction in HEP frequency with modified independence over two sessions Baseline:  Goal status: Ongoing     PLAN: SLP FREQUENCY:  once every approx 4 weeks   SLP DURATION:  7 total sessions   PLANNED INTERVENTIONS: Aspiration precaution training, Pharyngeal strengthening exercises, Diet toleration management , Trials of upgraded texture/liquids, Internal/external aids, SLP instruction and feedback, Compensatory strategies, and Patient/family education    Remaining deficits: None   Education / Equipment: HEP procedure, late effects head/neck radiation on swallowing abilities, how to modify HEP PRN.  Patient agrees to discharge. Patient goals were partially met. Patient is being discharged due to a change in medical status. (Deceased).   [place], [place] 12/16/2021, 10:42 AM  [facility] [address], [place], [place], [address] Phone: [phone]   Fax:  [phone]

## 2021-12-20 ENCOUNTER — Ambulatory Visit: Payer: Self-pay | Admitting: Radiation Oncology

## 2021-12-20 ENCOUNTER — Ambulatory Visit: Payer: Self-pay | Admitting: Physical Therapy

## 2021-12-21 ENCOUNTER — Ambulatory Visit: Payer: Self-pay | Admitting: Radiation Oncology

## 2021-12-21 ENCOUNTER — Encounter: Payer: Medicare Other | Admitting: Dietician

## 2021-12-26 ENCOUNTER — Other Ambulatory Visit: Payer: Medicare Other

## 2021-12-26 ENCOUNTER — Ambulatory Visit: Payer: Medicare Other

## 2022-01-13 ENCOUNTER — Other Ambulatory Visit (HOSPITAL_COMMUNITY): Payer: Medicare Other | Admitting: Dentistry

## 2022-01-13 NOTE — Progress Notes (Signed)
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  However, 1 day, before treatment, Dr. Isidore Moos noted that the patient was jaundiced and much more frail than usual.  She had not been eating or drinking.  She was admitted to the hospital and found to have metastatic disease to liver as well as severe bone marrow depression.  Comfort measures were initiated and radiation therapy was stopped.  Plan: Ms. Bittinger unfortunately passed soon after her hospitalization.  Emotional support was given to the patient's husband and daughter during her hospitalization as well as afterwards.   ______________________________________   Eppie Gibson, MD

## 2022-02-28 ENCOUNTER — Telehealth: Payer: Medicare Other | Admitting: Internal Medicine

## 2022-05-08 ENCOUNTER — Ambulatory Visit: Payer: Medicare Other | Admitting: Cardiology

## 2023-01-08 IMAGING — MG MM DIGITAL SCREENING UNILAT*R* W/ TOMO W/ CAD
4 series · 4 of 12 positions shown · non-contrast
Comparison: Previous exam(s).

CLINICAL DATA: Screening.

EXAM:
DIGITAL SCREENING UNILATERAL RIGHT MAMMOGRAM WITH CAD AND
TOMOSYNTHESIS
TECHNIQUE: Right screening digital craniocaudal and mediolateral oblique
mammograms were obtained. Right screening digital breast
tomosynthesis was performed. The images were evaluated with
computer-aided detection.

[R MLO synth-2D]
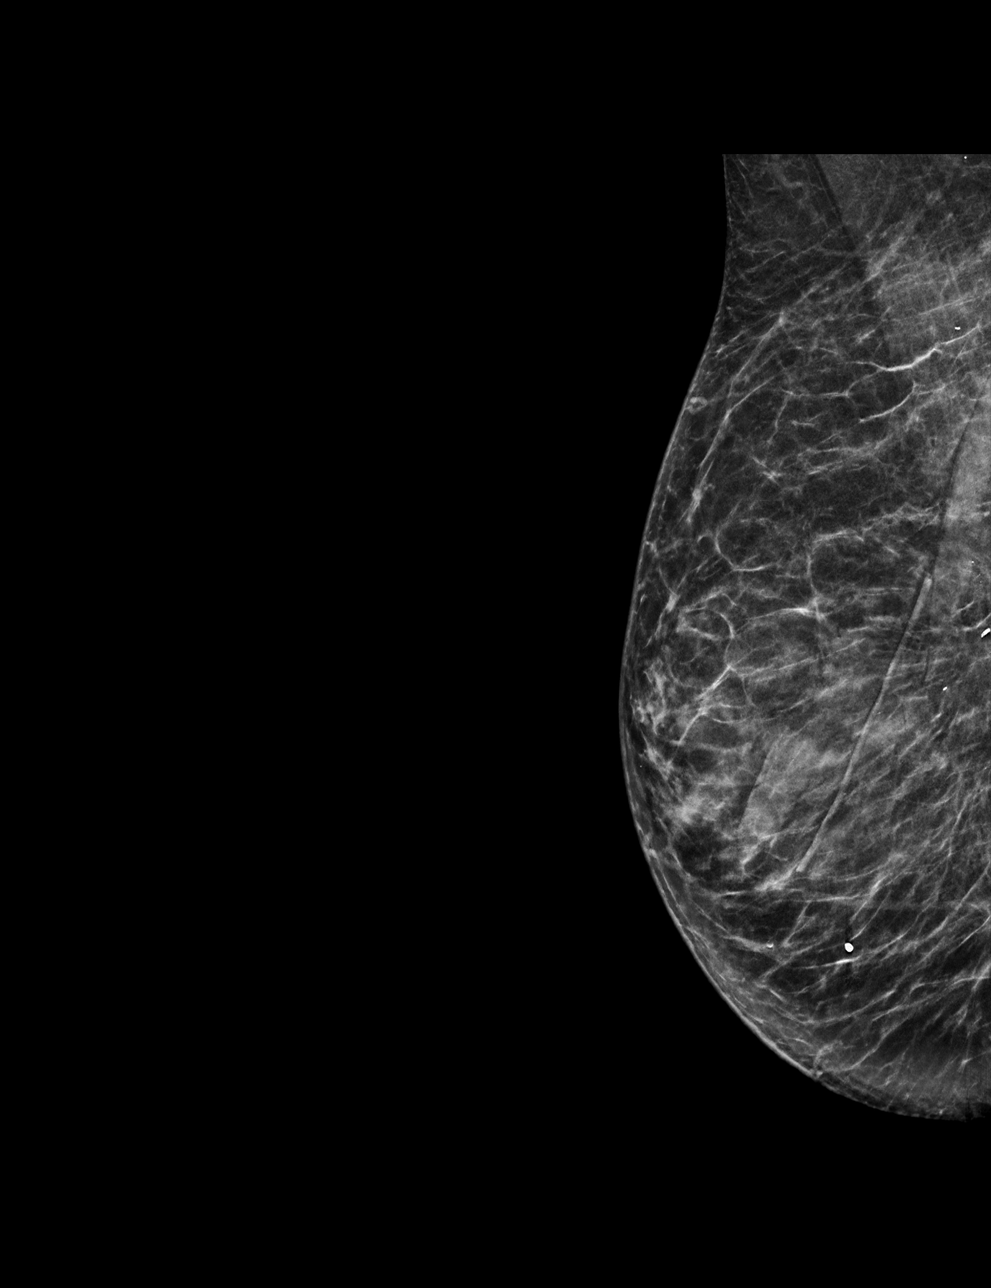

[R CC synth-2D]
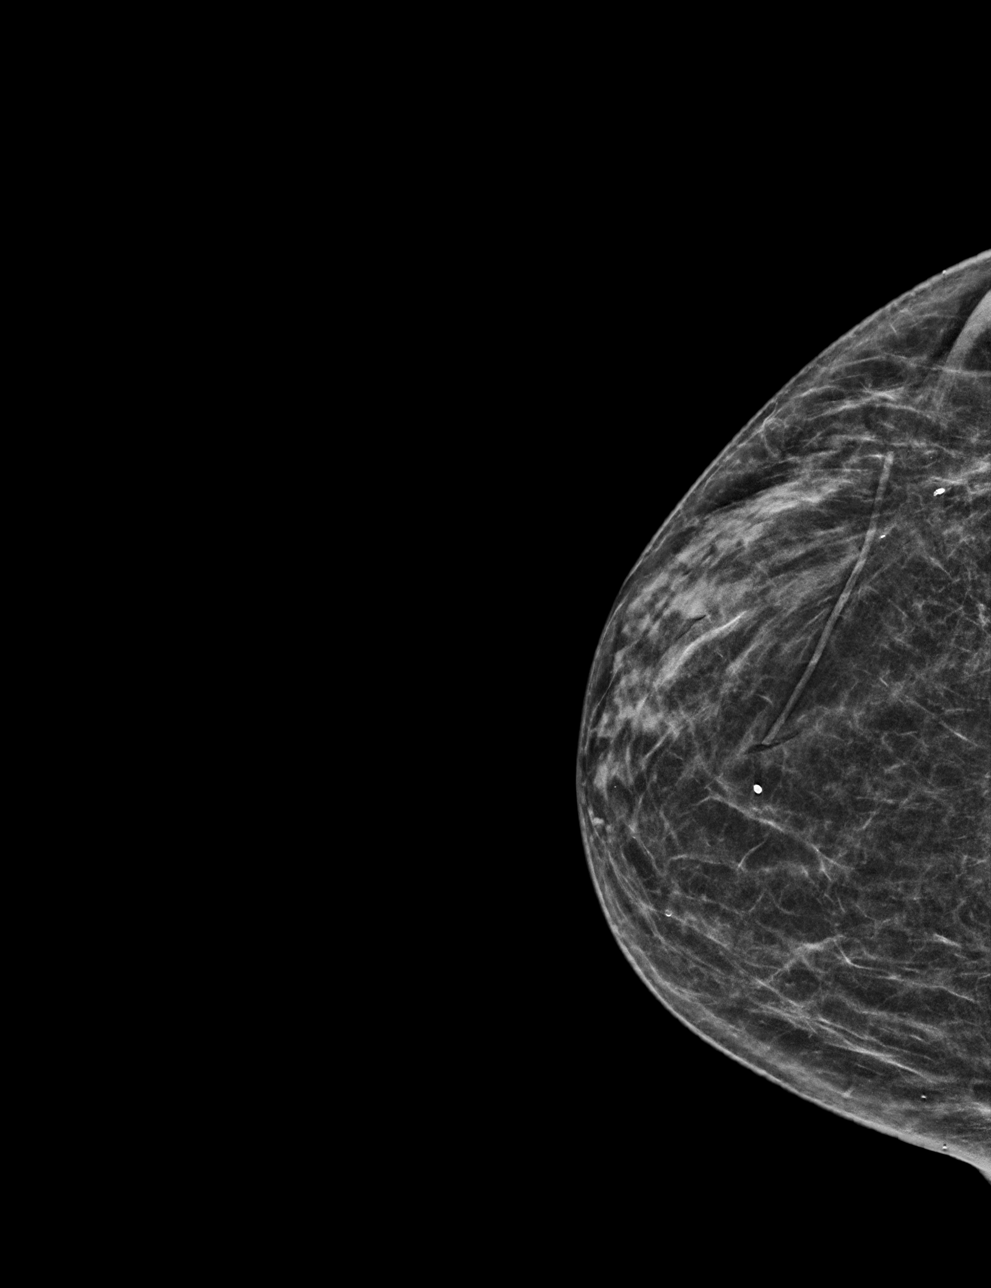

[R MLO tomo · tomo slice 25/49.0]
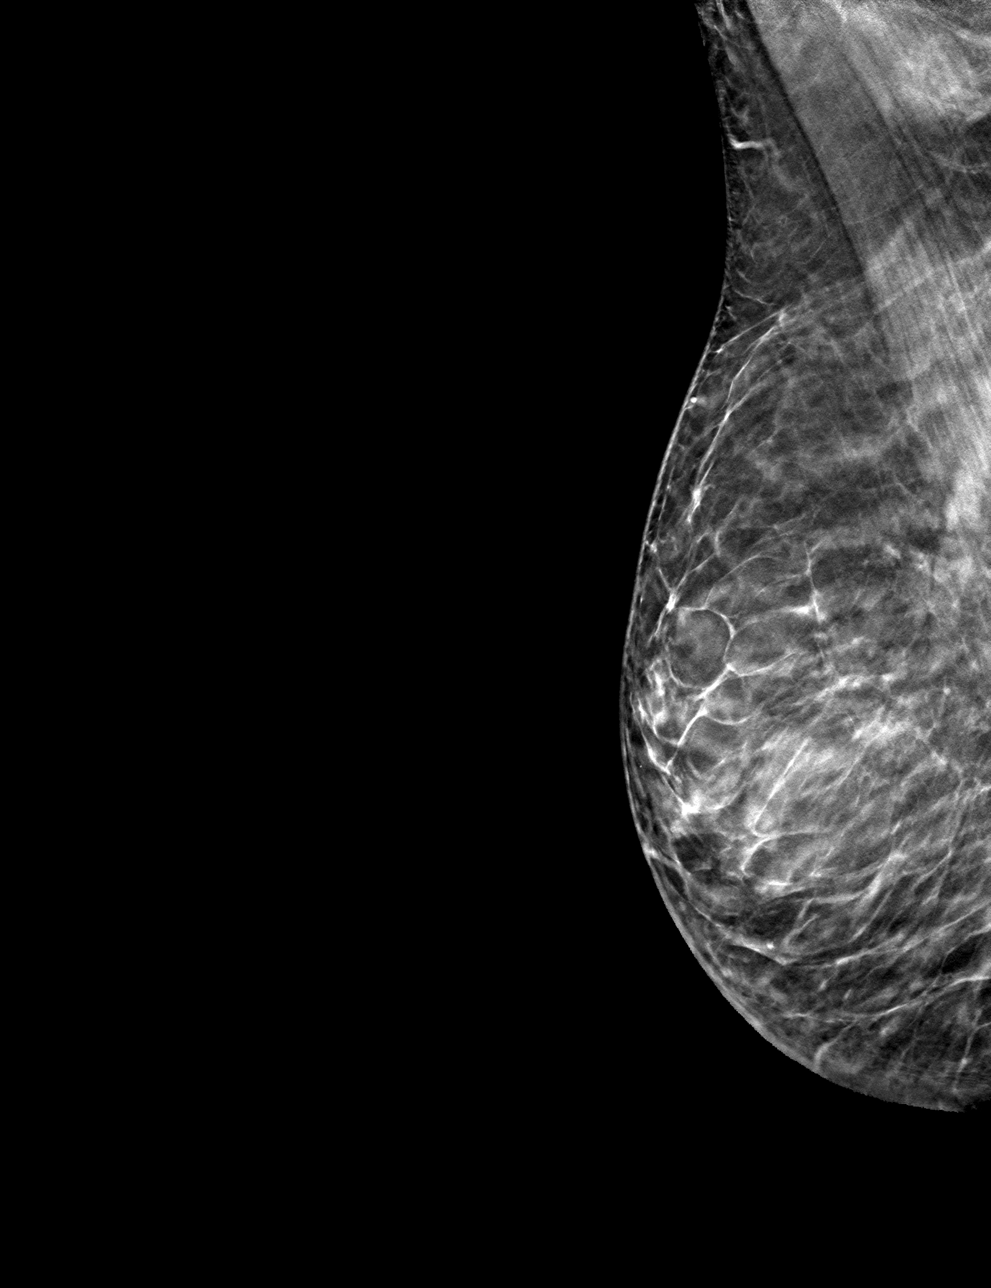

[R CC tomo · tomo slice 24/47.0]
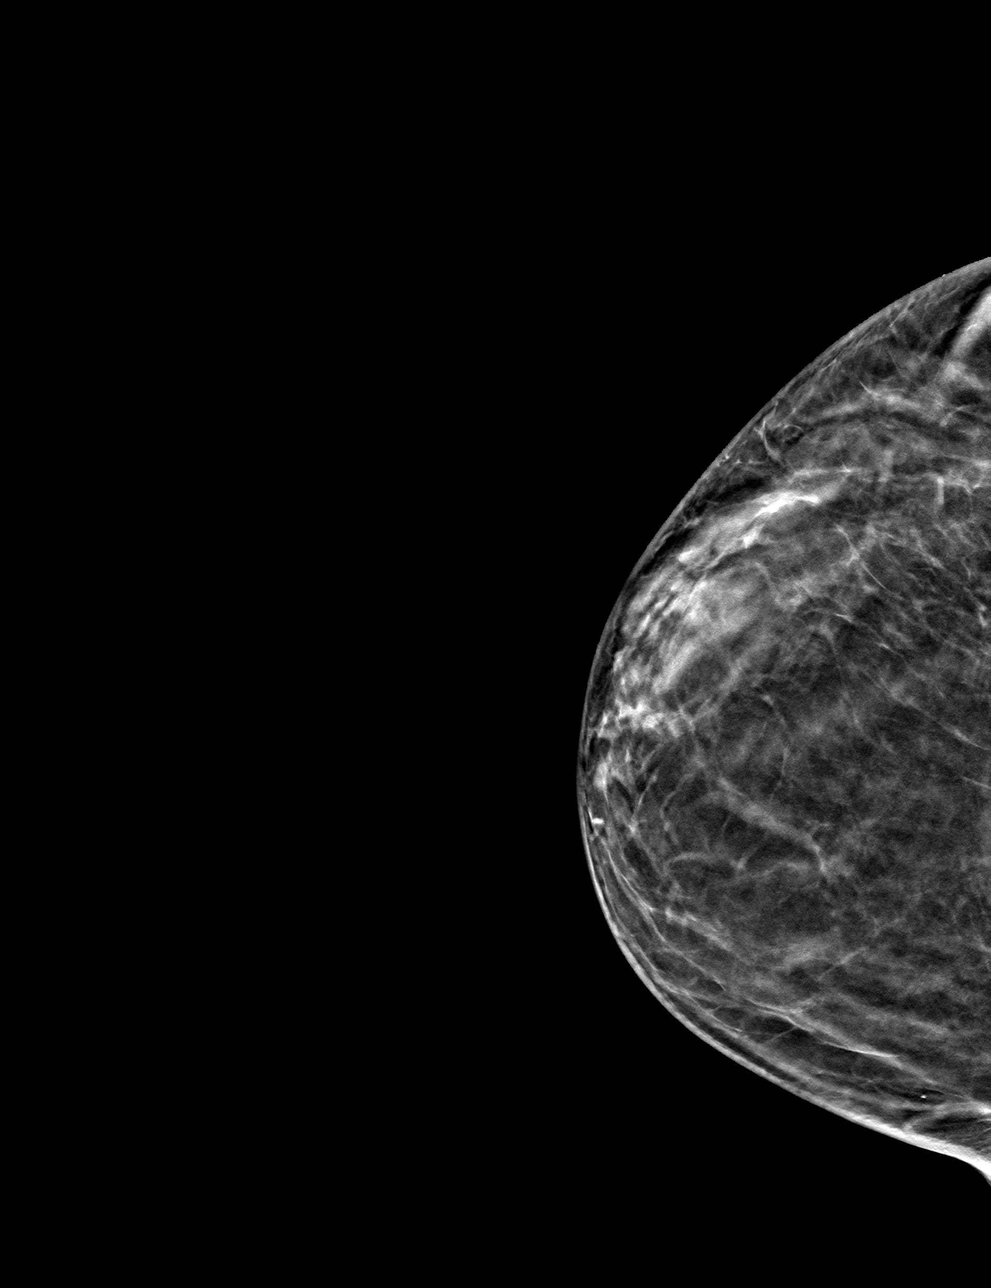

[4 of 12 positions shown; findings below may reference images not displayed]

ACR Breast Density Category c: The breast tissue is heterogeneously
dense, which may obscure small masses.
FINDINGS: The patient has had a left mastectomy. There are no findings
suspicious for malignancy.
IMPRESSION: No mammographic evidence of malignancy. A result letter of this
screening mammogram will be mailed directly to the patient.

RECOMMENDATION:
Screening mammogram in one year.  (Code:V6-Y-3H6)

BI-RADS CATEGORY  1: Negative.

## 2023-09-26 IMAGING — DX DG SHOULDER 2+V*L*
3 series · 3 of 3 positions shown · non-contrast
Comparison: None Available.

CLINICAL DATA: pain

EXAM:
LEFT SHOULDER - 2+ VIEW

[shoulder grashey (1 of 2)]
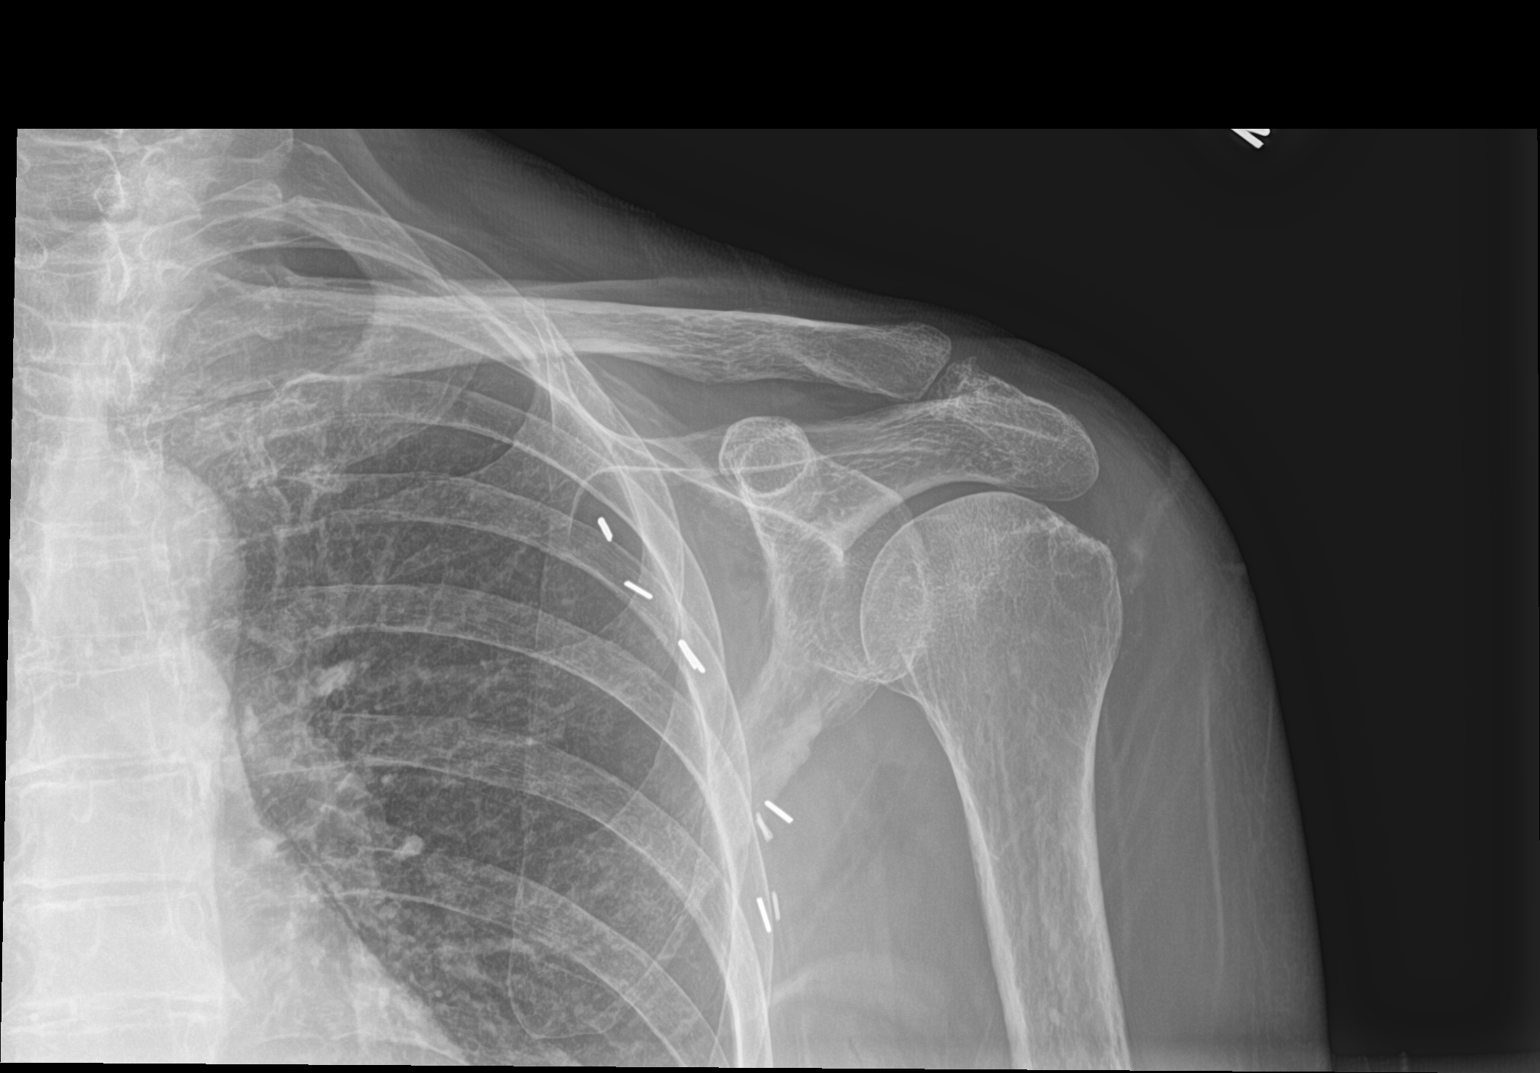

[shoulder y view]
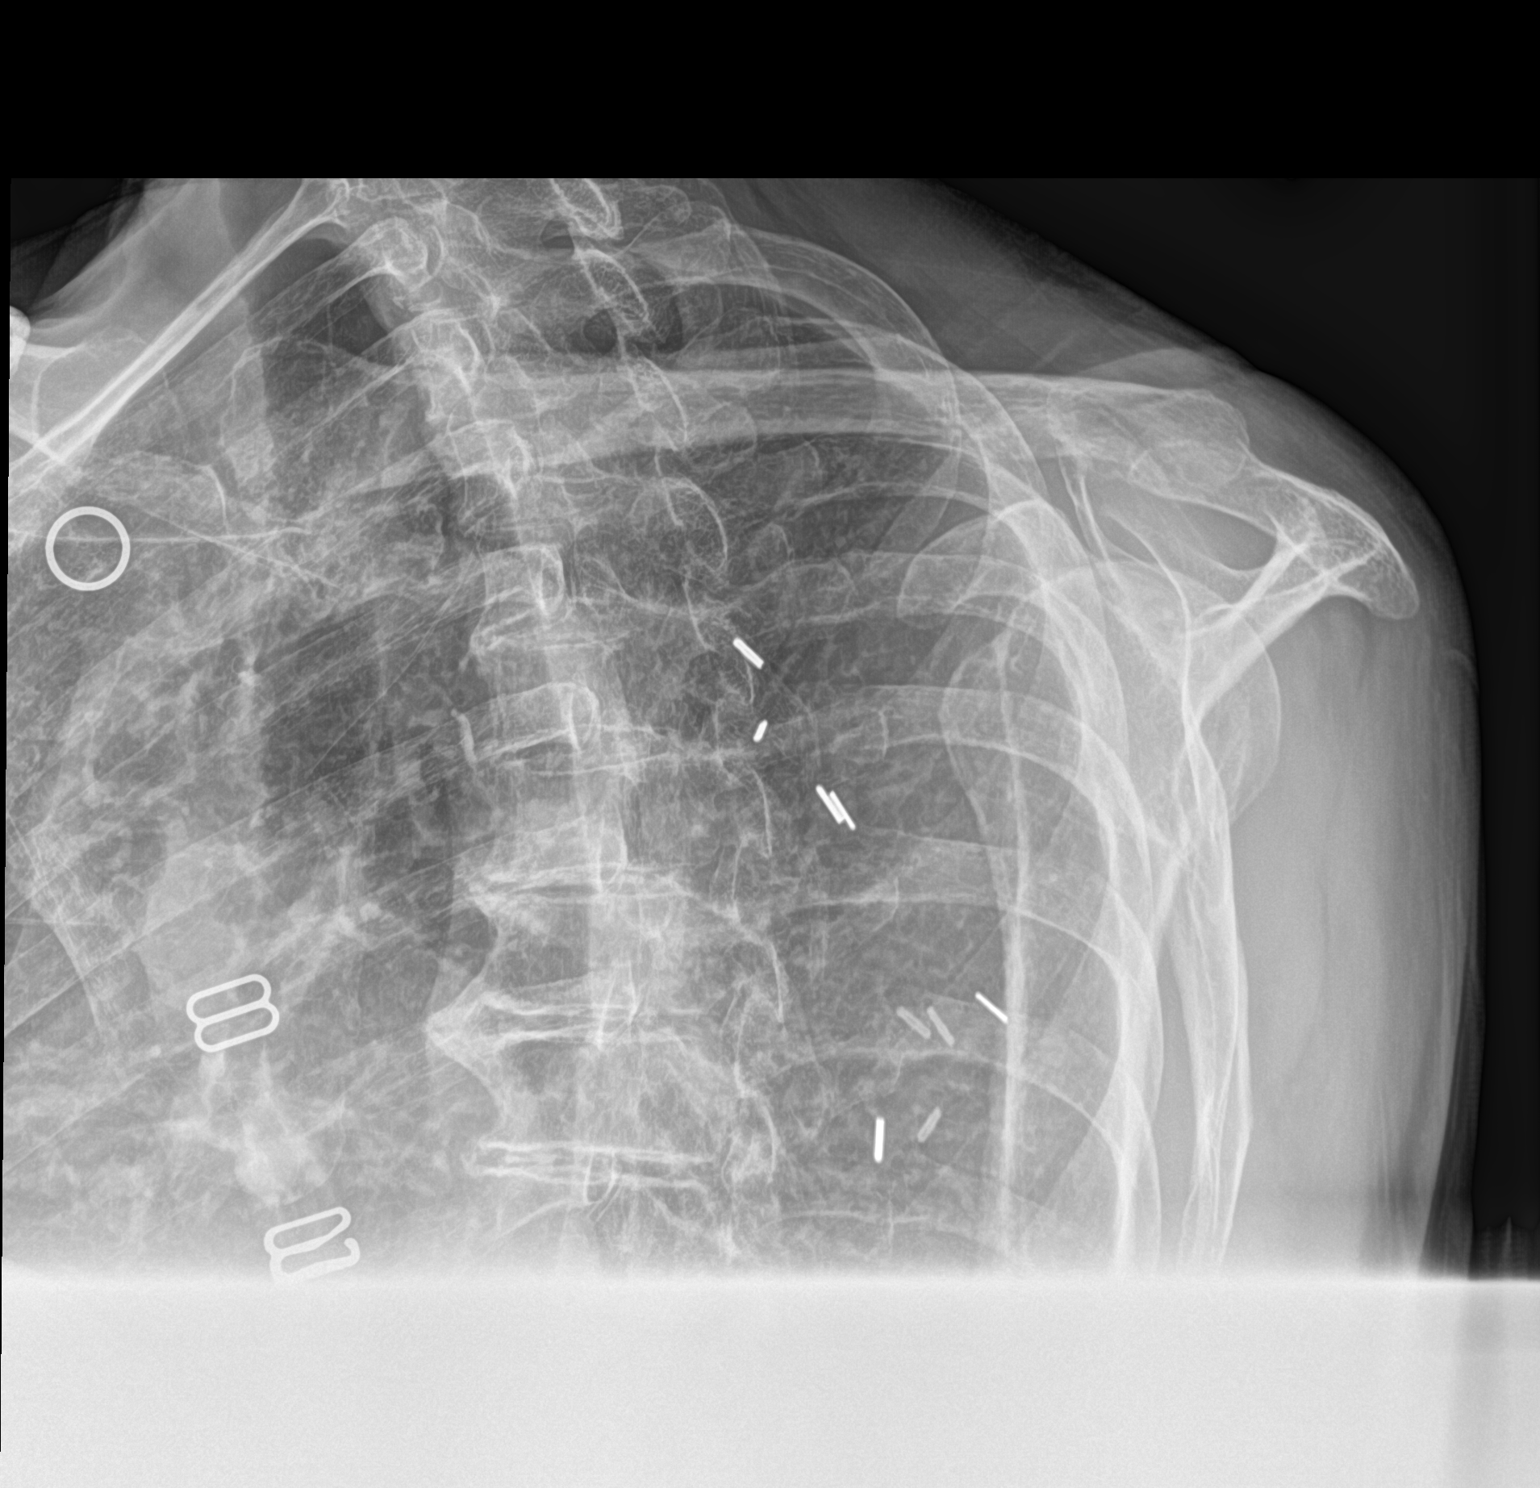

[shoulder grashey (2 of 2)]
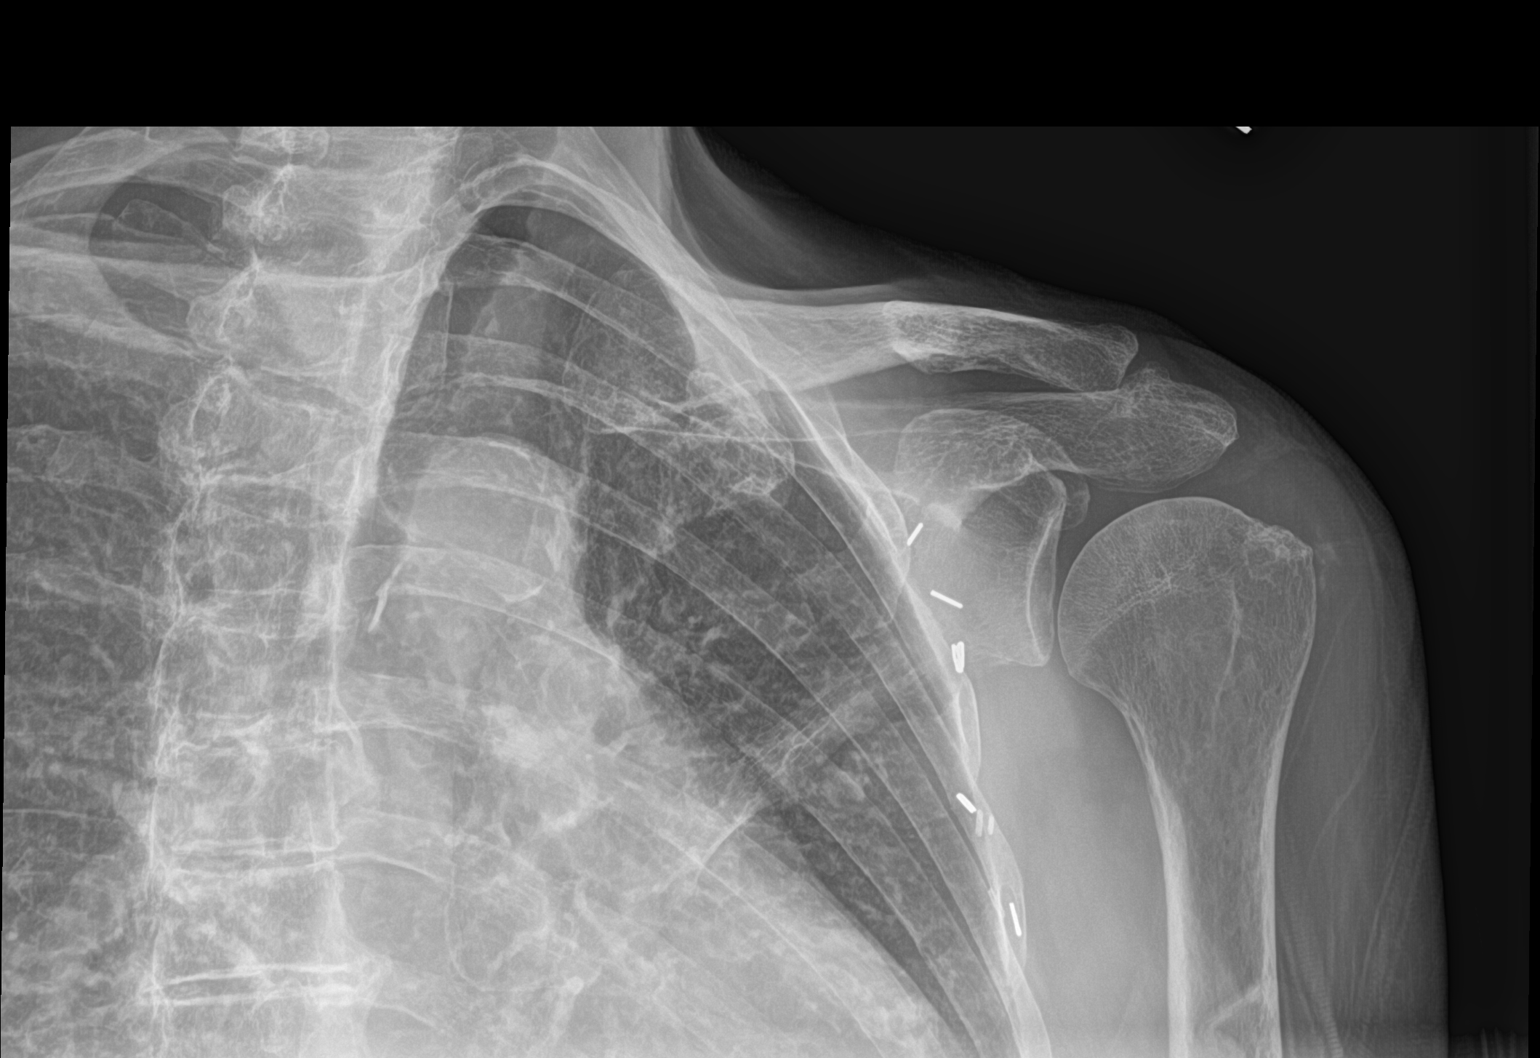

[3 of 3 positions shown; findings below may reference images not displayed]

FINDINGS: There is no evidence of fracture or dislocation. There is no
evidence of arthropathy or other focal bone abnormality. Soft
tissues are unremarkable. Vascular clips overlie the left shoulder.

Aortic calcification.
IMPRESSION: 1. No acute displaced fracture or dislocation.
2.  Aortic Atherosclerosis ([id number]).
